# Patient Record
Sex: Female | Born: 1960
Health system: Southern US, Community
[De-identification: ages and names within clinical notes are randomized; demographics above are authoritative.]

## PROBLEM LIST (undated history)

## (undated) DIAGNOSIS — Z800 Family history of malignant neoplasm of digestive organs: Secondary | ICD-10-CM

## (undated) DIAGNOSIS — N6009 Solitary cyst of unspecified breast: Secondary | ICD-10-CM

## (undated) DIAGNOSIS — Z85828 Personal history of other malignant neoplasm of skin: Secondary | ICD-10-CM

## (undated) DIAGNOSIS — Z803 Family history of malignant neoplasm of breast: Secondary | ICD-10-CM

## (undated) DIAGNOSIS — I10 Essential (primary) hypertension: Secondary | ICD-10-CM

## (undated) DIAGNOSIS — E079 Disorder of thyroid, unspecified: Secondary | ICD-10-CM

## (undated) DIAGNOSIS — N390 Urinary tract infection, site not specified: Secondary | ICD-10-CM

## (undated) DIAGNOSIS — C3490 Malignant neoplasm of unspecified part of unspecified bronchus or lung: Secondary | ICD-10-CM

## (undated) DIAGNOSIS — Z8042 Family history of malignant neoplasm of prostate: Secondary | ICD-10-CM

## (undated) DIAGNOSIS — T7840XA Allergy, unspecified, initial encounter: Secondary | ICD-10-CM

## (undated) HISTORY — DX: Personal history of other malignant neoplasm of skin: Z85.828

## (undated) HISTORY — DX: Solitary cyst of unspecified breast: N60.09

## (undated) HISTORY — PX: POLYPECTOMY: SHX149

## (undated) HISTORY — DX: Family history of malignant neoplasm of prostate: Z80.42

## (undated) HISTORY — DX: Family history of malignant neoplasm of breast: Z80.3

## (undated) HISTORY — DX: Allergy, unspecified, initial encounter: T78.40XA

## (undated) HISTORY — DX: Family history of malignant neoplasm of digestive organs: Z80.0

## (undated) HISTORY — PX: LUNG SURGERY: SHX703

## (undated) HISTORY — DX: Essential (primary) hypertension: I10

## (undated) HISTORY — DX: Disorder of thyroid, unspecified: E07.9

## (undated) HISTORY — DX: Malignant neoplasm of unspecified part of unspecified bronchus or lung: C34.90

---

## 1990-06-22 HISTORY — PX: APPENDECTOMY: SHX54

## 1998-11-06 ENCOUNTER — Other Ambulatory Visit: Admission: RE | Admit: 1998-11-06 | Discharge: 1998-11-06 | Payer: Self-pay | Admitting: Gynecology

## 1999-08-21 HISTORY — PX: PELVIC LAPAROSCOPY: SHX162

## 1999-08-21 HISTORY — PX: HYSTEROSCOPY: SHX211

## 1999-09-08 ENCOUNTER — Other Ambulatory Visit: Admission: RE | Admit: 1999-09-08 | Discharge: 1999-09-08 | Payer: Self-pay | Admitting: Gynecology

## 1999-09-11 ENCOUNTER — Encounter (INDEPENDENT_AMBULATORY_CARE_PROVIDER_SITE_OTHER): Payer: Self-pay

## 1999-09-11 ENCOUNTER — Ambulatory Visit (HOSPITAL_COMMUNITY): Admission: RE | Admit: 1999-09-11 | Discharge: 1999-09-11 | Payer: Self-pay | Admitting: Gynecology

## 2000-10-01 ENCOUNTER — Other Ambulatory Visit: Admission: RE | Admit: 2000-10-01 | Discharge: 2000-10-01 | Payer: Self-pay | Admitting: Gynecology

## 2001-10-03 ENCOUNTER — Other Ambulatory Visit: Admission: RE | Admit: 2001-10-03 | Discharge: 2001-10-03 | Payer: Self-pay | Admitting: Gynecology

## 2002-01-12 ENCOUNTER — Encounter: Payer: Self-pay | Admitting: Family Medicine

## 2002-01-12 ENCOUNTER — Encounter: Admission: RE | Admit: 2002-01-12 | Discharge: 2002-01-12 | Payer: Self-pay | Admitting: Family Medicine

## 2002-10-25 ENCOUNTER — Encounter: Admission: RE | Admit: 2002-10-25 | Discharge: 2002-10-25 | Payer: Self-pay | Admitting: Family Medicine

## 2002-10-25 ENCOUNTER — Encounter: Payer: Self-pay | Admitting: Family Medicine

## 2002-11-10 ENCOUNTER — Other Ambulatory Visit: Admission: RE | Admit: 2002-11-10 | Discharge: 2002-11-10 | Payer: Self-pay | Admitting: Gynecology

## 2003-12-07 ENCOUNTER — Other Ambulatory Visit: Admission: RE | Admit: 2003-12-07 | Discharge: 2003-12-07 | Payer: Self-pay | Admitting: Gynecology

## 2004-12-19 ENCOUNTER — Other Ambulatory Visit: Admission: RE | Admit: 2004-12-19 | Discharge: 2004-12-19 | Payer: Self-pay | Admitting: Gynecology

## 2005-03-15 ENCOUNTER — Encounter: Admission: RE | Admit: 2005-03-15 | Discharge: 2005-03-15 | Payer: Self-pay | Admitting: Family Medicine

## 2005-12-21 ENCOUNTER — Other Ambulatory Visit: Admission: RE | Admit: 2005-12-21 | Discharge: 2005-12-21 | Payer: Self-pay | Admitting: Gynecology

## 2007-01-07 ENCOUNTER — Other Ambulatory Visit: Admission: RE | Admit: 2007-01-07 | Discharge: 2007-01-07 | Payer: Self-pay | Admitting: Gynecology

## 2008-02-17 ENCOUNTER — Other Ambulatory Visit: Admission: RE | Admit: 2008-02-17 | Discharge: 2008-02-17 | Payer: Self-pay | Admitting: Gynecology

## 2008-03-01 ENCOUNTER — Ambulatory Visit: Payer: Self-pay | Admitting: Gynecology

## 2008-08-03 ENCOUNTER — Ambulatory Visit: Payer: Self-pay | Admitting: Gastroenterology

## 2008-08-15 ENCOUNTER — Telehealth: Payer: Self-pay | Admitting: Gastroenterology

## 2008-08-17 ENCOUNTER — Ambulatory Visit: Payer: Self-pay | Admitting: Gastroenterology

## 2008-08-17 LAB — HM COLONOSCOPY

## 2009-03-01 ENCOUNTER — Ambulatory Visit: Payer: Self-pay | Admitting: Gynecology

## 2009-03-01 ENCOUNTER — Other Ambulatory Visit: Admission: RE | Admit: 2009-03-01 | Discharge: 2009-03-01 | Payer: Self-pay | Admitting: Gynecology

## 2009-03-01 ENCOUNTER — Encounter: Payer: Self-pay | Admitting: Gynecology

## 2009-03-11 ENCOUNTER — Ambulatory Visit: Payer: Self-pay | Admitting: Gynecology

## 2009-04-05 ENCOUNTER — Ambulatory Visit: Payer: Self-pay | Admitting: Gynecology

## 2009-05-31 ENCOUNTER — Ambulatory Visit: Payer: Self-pay | Admitting: Gynecology

## 2010-04-04 ENCOUNTER — Ambulatory Visit: Payer: Self-pay | Admitting: Gynecology

## 2010-04-04 ENCOUNTER — Other Ambulatory Visit: Admission: RE | Admit: 2010-04-04 | Discharge: 2010-04-04 | Payer: Self-pay | Admitting: Gynecology

## 2010-07-24 NOTE — Miscellaneous (Signed)
Summary: GI PV  Clinical Lists Changes  Medications: Added new medication of MOVIPREP 100 GM  SOLR (PEG-KCL-NACL-NASULF-NA ASC-C) As per prep instructions. - Signed Rx of MOVIPREP 100 GM  SOLR (PEG-KCL-NACL-NASULF-NA ASC-C) As per prep instructions.;  #1 x 0;  Signed;  Entered by: Barton Fanny RN;  Authorized by: Meryl Dare MD [facility];  Method used: Electronically to [facility]  [address] [id number] [address], [place], [place]  [address], Ph: [phone] or [phone], Fax: [phone] Allergies: Added new allergy or adverse reaction of * CONDUCTION GEL FOR ULTRASOUNDS Observations: Added new observation of ALLERGY REV: Done (08/03/2008 13:39) Added new observation of NKA: F (08/03/2008 13:39)    Prescriptions: MOVIPREP 100 GM  SOLR (PEG-KCL-NACL-NASULF-NA ASC-C) As per prep instructions.  #1 x 0   Entered by:   Barton Fanny RN   Authorized by:   Meryl Dare MD [facility]   Signed by:   Barton Fanny RN on 08/03/2008   Method used:   Electronically to        [facility]  [address] [id number] ([facility]ail)       [address]       [place], [place]  [address]       Ph: [phone] or [phone]       Fax: [phone]   RxID:   [id number]

## 2010-07-24 NOTE — Procedures (Signed)
Summary: Colonoscopy   Colonoscopy  Procedure date:  08/17/2008  Findings:      Location:  [facility].    Procedures Next Due Date:    Colonoscopy: 08/2013  COLONOSCOPY PROCEDURE REPORT  PATIENT:  Laura Smith, Laura Smith  MR#:  [id number] BIRTHDATE:   03-07-1961   GENDER:   female  ENDOSCOPIST:   Venita Lick. Russella Dar, MD, [facility] Referred by:   PROCEDURE DATE:  08/17/2008 PROCEDURE:  Colonoscopy, diagnostic ASA CLASS:   Class II INDICATIONS: 1) family history of colon cancer, mother at age 2.  MEDICATIONS:    Fentanyl 100 mcg IV, Versed 10 mg IV  DESCRIPTION OF PROCEDURE:   After the risks benefits and alternatives of the procedure were thoroughly explained, informed consent was obtained.  Digital rectal exam was performed and revealed no abnormalities.   The LB PCF-H180AL [id number] endoscope was introduced through the anus and advanced to the cecum, which was identified by both the appendix and ileocecal valve, without limitations.  The quality of the prep was excellent, using MoviPrep.  The instrument was then slowly withdrawn as the colon was fully examined. <<PROCEDUREIMAGES>>          <<OLD IMAGES>>  FINDINGS:  A normal appearing cecum, ileocecal valve, and appendiceal orifice were identified. The ascending, hepatic flexure, transverse, splenic flexure, descending, sigmoid colon, and rectum appeared unremarkable. Retroflexed views in the rectum revealed internal hemorrhoids, small. The time to cecum =  7.75  minutes. The scope was then withdrawn (time =  9  min) from the patient and the procedure completed.  COMPLICATIONS:   None  ENDOSCOPIC IMPRESSION:  1) Normal colon  2) Internal hemorrhoids    REPEAT EXAM:   In 5 year(s) for Colonoscopy.    Venita Lick. Russella Dar, MD, [facility]    CC: Blair Heys MD

## 2010-11-07 NOTE — Op Note (Signed)
[facility]  Patient:    Laura Smith, Laura Smith                        MRN: [id number] Proc. Date: 09/11/99 Adm. Date:  [id number] Attending:  Merrily Pew                           Operative Report  PREOPERATIVE DIAGNOSIS:       Increasing dysmenorrhea.  Thickened endometrial stripe.  POSTOPERATIVE DIAGNOSIS:      Endometriosis.  Abdominopelvic adhesions.  Absent  proximal right fallopian tube.  OPERATION:                    Hysteroscopy, D&C.  Laparoscopic lysis of adhesions. Biopsy fulguration of endometriosis.  SURGEON:                      Timothy P. Fontaine, M.D.  ASSISTANT:  ANESTHESIA:                   General endotracheal anesthesia.  ESTIMATED BLOOD LOSS:         Minimal.  COMPLICATIONS:                None.  SPECIMEN:                     Endometrial curettings.  Peritoneal biopsy of endometriosis.  SORBITOL DISCREPANCY:         Less than 150 cc.  FINDINGS:                     Hysteroscopy; endometrial cavity noted to be smaller with single tubal ostia identified.  No defect such as polyps, myomas. Laparoscopy; anterior cul-de-sac with minimal vesicouterine scarring consistent  with cesarean section history.  Posterior cul-de-sac with classic powderburn endometriotic lesion medial to right uterosacral ligament midposition, biopsied and subsequently fulgurated.  Uterus overall normal size, midline, and mobile. Left fallopian tube normal length, caliber, and fimbriated end.  Left ovary grossly normal, free, and mobile.  Right fallopian tube with absent proximal section. o evidence of any protuberance from the uterus.  Short distal segment approximately 2 cm noted above pelvic brim with normal appearing fimbria.  Right ovary grossly normal at the level of the pelvic brim.  Cecum grossly normal with no evidence f appendix.  Liver smooth with no abnormalities.  Gallbladder not visualized. The veil of omental adhesions  from umbilicus to suprapubic along the site of her prior midline incision sharply lysed to the level of the periumbilical region.  DESCRIPTION OF PROCEDURE:     The patient was taken to the operating room and underwent general endotracheal anesthesia and was placed in the low dorsal lithotomy position.  Received abdominal, perineal, and vaginal preparation with  Betadine scrub and Betadine solution by the nursing personnel and the bladder was emptied with in-and-out Foley catheterization.  The patient was draped in the usual fashion.  EUA was performed.  Subsequently, the cervix was visualized with a speculum and grasped with a single tooth tenaculum.  The cervix was gently dilated to admit the diagnostic hysteroscope and hysteroscopy was performed with findings noted above.  A gentle sharp curettage was then performed without difficulty and rehysteroscopy again revealed good distention, no evidence of perforation.  The  patient was recatheterized and again emptied her bladder.  The single tooth tenaculum removed and replaced  with a Hulka tenaculum and the surgical team regloved and subsequently we proceeded with the laparoscopy.  A vertical infraumbilical incision was made, the Veress needle placed, its position verified with water and 2 liters of carbon dioxide gas was infused without difficulty. The 10 mm laparoscopic trocar was then placed without difficulty, its position verified visually.  A suprapubic right of midline 5 mm port was then placed under direct  visualization after transillumination for the vessels without difficulty and examination of the pelvic organs and upper abdominal examination was carried out with findings noted above.  The posterior cul-de-sac endometriotic implant was hen biopsied and subsequently, the base was fulgurated using bipolar cautery in a brushing technique.  The veil of omental adhesions was then addressed and through progressive bipolar  cauterization and sharp incision, this veil was released to the level of the periumbilical region.  The operative sites were reinspected and adequate hemostasis was visualized.  Subsequently, the 5 mm port was removed, the gas allowed to escape, and the sites were reinspected under low pressure situation showing adequate hemostasis.  The 10 mm port was then backed out under direct visualization showing adequate hemostasis and no evidence of hernia formation. The infraumbilical incision was then closed using 0 Vicryl in an interrupted subcutaneous fascial stitch and both skin incisions closed using 4-0 Vicryl in interrupted subcuticular stitch.  The incisions were infiltrated using 0.25% Marcaine.  Sterile dressing was applied.  Hulka tenaculum removed.  The patient  awakened without difficulty and taken to the recovery room in good condition having tolerated the procedure well.  Sorbitol discrepancy less than 150 cc. DD:  09/11/99 TD:  09/11/99 Job: [id number]

## 2010-12-17 ENCOUNTER — Ambulatory Visit (INDEPENDENT_AMBULATORY_CARE_PROVIDER_SITE_OTHER): Payer: 59 | Admitting: Gynecology

## 2010-12-17 DIAGNOSIS — B3731 Acute candidiasis of vulva and vagina: Secondary | ICD-10-CM

## 2010-12-17 DIAGNOSIS — R82998 Other abnormal findings in urine: Secondary | ICD-10-CM

## 2010-12-17 DIAGNOSIS — N898 Other specified noninflammatory disorders of vagina: Secondary | ICD-10-CM

## 2010-12-17 DIAGNOSIS — B373 Candidiasis of vulva and vagina: Secondary | ICD-10-CM

## 2010-12-17 DIAGNOSIS — N390 Urinary tract infection, site not specified: Secondary | ICD-10-CM

## 2010-12-17 DIAGNOSIS — R635 Abnormal weight gain: Secondary | ICD-10-CM

## 2011-03-06 ENCOUNTER — Other Ambulatory Visit: Payer: Self-pay | Admitting: Dermatology

## 2011-04-10 ENCOUNTER — Ambulatory Visit (INDEPENDENT_AMBULATORY_CARE_PROVIDER_SITE_OTHER): Payer: 59 | Admitting: Gynecology

## 2011-04-10 ENCOUNTER — Encounter: Payer: Self-pay | Admitting: Gynecology

## 2011-04-10 VITALS — BP 124/80 | Ht 67.0 in | Wt 222.0 lb

## 2011-04-10 DIAGNOSIS — Z01419 Encounter for gynecological examination (general) (routine) without abnormal findings: Secondary | ICD-10-CM

## 2011-04-10 DIAGNOSIS — N951 Menopausal and female climacteric states: Secondary | ICD-10-CM

## 2011-04-10 LAB — HM PAP SMEAR

## 2011-04-10 NOTE — Progress Notes (Signed)
Laura Smith [birth year] [id number]        50 y.o.  for annual exam.  Overall doing well. She does note some hot flashes and sweats. Last menstrual period was January 2012. We removed her IUD in December 2010 and she had some bleeding after that, remained without a menses and then had a spontaneous normal menses in January. No bleeding since then.  Past medical history,surgical history, medications, allergies, family history and social history were all reviewed and documented in the EPIC chart. ROS:  Was performed and pertinent positives and negatives are included in the history.  Exam: chaperone present Filed Vitals:   04/10/11 1400  BP: 124/80   General appearance  Normal Skin grossly normal Head/Neck normal with no cervical or supraclavicular adenopathy thyroid normal Lungs  clear Cardiac RR, without RMG Abdominal  soft, nontender, without masses, organomegaly or hernia Breasts  examined lying and sitting without masses, retractions, discharge or axillary adenopathy. Pelvic  Ext/BUS/vagina  normal   Cervix  normal    Uterus  axial, normal size, shape and contour, midline and mobile nontender   Adnexa  Without masses or tenderness    Anus and perineum  normal   Rectovaginal  normal sphincter tone without palpated masses or tenderness.  External hemorrhoid   Assessment/Plan:  50 y.o. female for annual exam.    1. External hemorrhoid. Patient treating symptomatically which is acceptable to her. She does note some frequent stooling throughout the day and I recommend she try bran-type OTC such as Metamucil or FiberCon to see if this doesn't help.  She does get colonoscopies every 5 years by Dr. Russella Dar due to a family history of colon cancer she is up-to-date with that. 2. Menopausal status. Having some night sweats and hot flashes. I recommended OTC soy trial. I discuss possible HRT but she wants to try the soy first. She had her last period in January after having her IUD removed  approximately a year prior to that. I've asked her if she does any further bleeding she is to report to me for further evaluation. As long as she remains without any bleeding then we'll follow expectantly. 3. Health maintenance. Self breast exams on a monthly basis discussed encouraged. She is due for her mammogram now knows to schedule it. She has no history of abnormal Pap smears and has documented annual normal Pap smears in her chart. Last Pap smear 2011. I discussed current screening guidelines with less frequent screening she's comfortable with this and we'll plan an every 3 year Pap smear and no Pap was done today.  No blood work was done as is all done through her primary office. Assuming she continues well from a gynecologic standpoint she will see me in a year.    Dara Lords MD, 2:52 PM 04/10/2011

## 2011-09-29 ENCOUNTER — Encounter: Payer: Self-pay | Admitting: Gynecology

## 2011-12-18 ENCOUNTER — Other Ambulatory Visit: Payer: Self-pay | Admitting: Dermatology

## 2012-01-15 ENCOUNTER — Other Ambulatory Visit: Payer: Self-pay | Admitting: Dermatology

## 2012-04-22 ENCOUNTER — Ambulatory Visit (INDEPENDENT_AMBULATORY_CARE_PROVIDER_SITE_OTHER): Payer: 59 | Admitting: Gynecology

## 2012-04-22 ENCOUNTER — Encounter: Payer: Self-pay | Admitting: Gynecology

## 2012-04-22 ENCOUNTER — Telehealth: Payer: Self-pay | Admitting: *Deleted

## 2012-04-22 VITALS — BP 120/74 | Ht 67.0 in | Wt 228.0 lb

## 2012-04-22 DIAGNOSIS — I10 Essential (primary) hypertension: Secondary | ICD-10-CM | POA: Insufficient documentation

## 2012-04-22 DIAGNOSIS — Z01419 Encounter for gynecological examination (general) (routine) without abnormal findings: Secondary | ICD-10-CM

## 2012-04-22 DIAGNOSIS — N809 Endometriosis, unspecified: Secondary | ICD-10-CM | POA: Insufficient documentation

## 2012-04-22 DIAGNOSIS — R609 Edema, unspecified: Secondary | ICD-10-CM

## 2012-04-22 DIAGNOSIS — G43909 Migraine, unspecified, not intractable, without status migrainosus: Secondary | ICD-10-CM | POA: Insufficient documentation

## 2012-04-22 DIAGNOSIS — R600 Localized edema: Secondary | ICD-10-CM

## 2012-04-22 DIAGNOSIS — R197 Diarrhea, unspecified: Secondary | ICD-10-CM

## 2012-04-22 DIAGNOSIS — K649 Unspecified hemorrhoids: Secondary | ICD-10-CM | POA: Insufficient documentation

## 2012-04-22 DIAGNOSIS — N3010 Interstitial cystitis (chronic) without hematuria: Secondary | ICD-10-CM | POA: Insufficient documentation

## 2012-04-22 NOTE — Patient Instructions (Signed)
Follow-up for annual exam

## 2012-04-22 NOTE — Telephone Encounter (Signed)
Pt said you told her to make appointment to see Dr.Panosh, she has appointment on 07/06/12. Pt said that was the earliest she could get in, she was placed on cancellation list. Pt asked if you thinks she should be seen sooner? Or okay to wait until 07/06/12? Please advise

## 2012-04-22 NOTE — Progress Notes (Signed)
Laura Smith 1960-07-28 [id number]        51 y.o.  G1P1001 for annual exam.  Several issues that are below  Past medical history,surgical history, medications, allergies, family history and social history were all reviewed and documented in the EPIC chart. ROS:  Was performed and pertinent positives and negatives are included in the history.  Exam: Kim assistant Filed Vitals:   04/22/12 1201  BP: 120/74  Height: 5\' 7"  (1.702 m)  Weight: 228 lb (103.42 kg)   General appearance  Normal Skin grossly normal Head/Neck normal with no cervical or supraclavicular adenopathy thyroid normal Lungs  clear Cardiac RR, without RMG Abdominal  soft, nontender, without masses, organomegaly or hernia Breasts  examined lying and sitting without masses, retractions, discharge or axillary adenopathy. Pelvic  Ext/BUS/vagina  normal   Cervix  normal   Uterus  axial, normal size, shape and contour, midline and mobile nontender   Adnexa  Without masses or tenderness    Anus and perineum  normal   Rectovaginal  normal sphincter tone without palpated masses or tenderness. Large external hemorrhoid noted  Lower extremities with bilateral pitting edema to mid calf  Assessment/Plan:  51 y.o. G1P1001 female for annual exam.   1. Postmenopausal. Patient without bleeding in over a year, prior FSH 84 in 2010. Doing well from a symptom standpoint without significant hot flashes or night sweats. We'll continue to monitor. Patient is to report any bleeding. 2. Pap smear. No Pap smear done today. Last Pap smear 2011. No history of abnormal Pap smears. Plan every 3-5 year screening. 3. Mammography.  Mammogram 09/2011. Continue with annual mammography. SBE monthly reviewed. 4. DEXA. Never had we'll plan closer to 60. Increase calcium vitamin D reviewed. 5. Diarrhea. Patient's having chronic diarrhea for the past 6 months or so. Sees Dr. Russella Dar and had colonoscopy 2010. Recommend she follow up with him in reference to  her chronic diarrhea she agrees to arrange this. 6. Pedal edema. Patient notes worsening intermittent bilateral pedal edema. Exam today does show pitting edema on both ankles. Is being followed for hypertension by Dr. Manus Gunning. Recommended she follow up with him or another physician for evaluation rule out cardiac/hepatic/renal abnormality. Patient agrees to arrange. 7. Hemorrhoid. Patient does have impressive external hemorrhoids. Offered referral to general surgery but she declined. 8. Health maintenance. No blood work done as it is done through her primary physician's office. Follow up as noted above. Follow up in GYN office one year sooner as needed.   Dara Lords MD, 12:57 PM 04/22/2012

## 2012-04-23 LAB — URINALYSIS W MICROSCOPIC + REFLEX CULTURE
Bacteria, UA: NONE SEEN
Bilirubin Urine: NEGATIVE
Casts: NONE SEEN
Crystals: NONE SEEN
Glucose, UA: NEGATIVE mg/dL
Hgb urine dipstick: NEGATIVE
Ketones, ur: NEGATIVE mg/dL
Leukocytes, UA: NEGATIVE
Nitrite: NEGATIVE
Protein, ur: NEGATIVE mg/dL
Specific Gravity, Urine: 1.019 (ref 1.005–1.030)
Squamous Epithelial / LPF: NONE SEEN
Urobilinogen, UA: 0.2 mg/dL (ref 0.0–1.0)
pH: 5.5 (ref 5.0–8.0)

## 2012-04-25 NOTE — Telephone Encounter (Signed)
Pt informed with the below note. 

## 2012-04-25 NOTE — Telephone Encounter (Signed)
I would like her seen sooner but I am not sure that they will have an opening and it really is not an emergency. I would have her stay on the cancellation list and hopefully he'll be able to see her sooner.

## 2012-07-06 ENCOUNTER — Encounter: Payer: Self-pay | Admitting: Internal Medicine

## 2012-07-06 ENCOUNTER — Ambulatory Visit (INDEPENDENT_AMBULATORY_CARE_PROVIDER_SITE_OTHER): Payer: 59 | Admitting: Internal Medicine

## 2012-07-06 VITALS — BP 128/88 | HR 83 | Temp 99.1°F | Ht 67.25 in | Wt 223.0 lb

## 2012-07-06 DIAGNOSIS — K529 Noninfective gastroenteritis and colitis, unspecified: Secondary | ICD-10-CM

## 2012-07-06 DIAGNOSIS — I10 Essential (primary) hypertension: Secondary | ICD-10-CM

## 2012-07-06 DIAGNOSIS — R600 Localized edema: Secondary | ICD-10-CM

## 2012-07-06 DIAGNOSIS — J309 Allergic rhinitis, unspecified: Secondary | ICD-10-CM

## 2012-07-06 DIAGNOSIS — R197 Diarrhea, unspecified: Secondary | ICD-10-CM

## 2012-07-06 DIAGNOSIS — Z6834 Body mass index (BMI) 34.0-34.9, adult: Secondary | ICD-10-CM

## 2012-07-06 DIAGNOSIS — G43909 Migraine, unspecified, not intractable, without status migrainosus: Secondary | ICD-10-CM

## 2012-07-06 DIAGNOSIS — R609 Edema, unspecified: Secondary | ICD-10-CM

## 2012-07-06 DIAGNOSIS — J069 Acute upper respiratory infection, unspecified: Secondary | ICD-10-CM

## 2012-07-06 LAB — HEPATIC FUNCTION PANEL
ALT: 23 U/L (ref 0–35)
AST: 19 U/L (ref 0–37)
Albumin: 3.9 g/dL (ref 3.5–5.2)
Alkaline Phosphatase: 65 U/L (ref 39–117)
Bilirubin, Direct: 0 mg/dL (ref 0.0–0.3)
Total Bilirubin: 0.4 mg/dL (ref 0.3–1.2)
Total Protein: 6.9 g/dL (ref 6.0–8.3)

## 2012-07-06 LAB — T4, FREE: Free T4: 0.73 ng/dL (ref 0.60–1.60)

## 2012-07-06 LAB — TSH: TSH: 1.82 u[IU]/mL (ref 0.35–5.50)

## 2012-07-06 LAB — MAGNESIUM: Magnesium: 2.1 mg/dL (ref 1.5–2.5)

## 2012-07-06 MED ORDER — HYDROCOD POLST-CHLORPHEN POLST 10-8 MG/5ML PO LQCR: 5.0000 mL | Freq: Two times a day (BID) | ORAL | Status: DC | PRN

## 2012-07-06 MED ORDER — ALBUTEROL SULFATE HFA 108 (90 BASE) MCG/ACT IN AERS: 2.0000 | INHALATION_SPRAY | Freq: Four times a day (QID) | RESPIRATORY_TRACT | Status: DC | PRN

## 2012-07-06 NOTE — Patient Instructions (Addendum)
Most causes of swelling are related to gravity medications salt and fluids  And temperature. Good news doesn't seem to be a kidney or heart problem.  Avoid antiinflammatories such as advil aleve on a regular basis. Consider nasal cortisone in allergy season Limit sodium salt  Etc . Increase exercise can help  Get rid of fluid also.    Will check thyroid to make sure not related .   Your respiratory infection seems viral and could last for a few weeks but contact us if fever or wheezing . Can try cough med and inhaler as helpful . Stools could be from many t hings  consdier lactose intolerance extta magnesium . Certain sugars in foods and juices. Will follow  Weight gain at menopause if common if not taking measures to change eat less and exercise more but we need to make sure other things not contributing.  Sleep deprivation is associated with weight gain also.  ROV in a couple months or as needed in the meantime     Cough, Adult  A cough is a reflex that helps clear your throat and airways. It can help heal the body or may be a reaction to an irritated airway. A cough may only last 2 or 3 weeks (acute) or may last more than 8 weeks (chronic).  CAUSES Acute cough:  Viral or bacterial infections. Chronic cough:  Infections.  Allergies.  Asthma.  Post-nasal drip.  Smoking.  Heartburn or acid reflux.  Some medicines.  Chronic lung problems (COPD).  Cancer. SYMPTOMS   Cough.  Fever.  Chest pain.  Increased breathing rate.  High-pitched whistling sound when breathing (wheezing).  Colored mucus that you cough up (sputum). TREATMENT   A bacterial cough may be treated with antibiotic medicine.  A viral cough must run its course and will not respond to antibiotics.  Your caregiver may recommend other treatments if you have a chronic cough. HOME CARE INSTRUCTIONS   Only take over-the-counter or prescription medicines for pain, discomfort, or fever as directed by  your caregiver. Use cough suppressants only as directed by your caregiver.  Use a cold steam vaporizer or humidifier in your bedroom or home to help loosen secretions.  Sleep in a semi-upright position if your cough is worse at night.  Rest as needed.  Stop smoking if you smoke. SEEK IMMEDIATE MEDICAL CARE IF:   You have pus in your sputum.  Your cough starts to worsen.  You cannot control your cough with suppressants and are losing sleep.  You begin coughing up blood.  You have difficulty breathing.  You develop pain which is getting worse or is uncontrolled with medicine.  You have a fever. MAKE SURE YOU:   Understand these instructions.  Will watch your condition.  Will get help right away if you are not doing well or get worse. Document Released: 12/05/2010 Document Revised: 08/31/2011 Document Reviewed: 12/05/2010 [facility] Patient Information 2013 [place], [place].    Using Your Inhaler Proper inhaler technique is very important. Good technique assures that the medicine reaches the lungs. Poor technique results in depositing the medicine on the tongue and back of the throat rather than in the airways. STEPS TO FOLLOW IF USING INHALER WITHOUT EXTENSION TUBE: 1. Remove cap from inhaler. 2. Shake inhaler for 5 seconds before each inhalation (breathing in). 3. Position the inhaler so that the top of the canister faces up. 4. Put your index finger on the top of the medication canister. Your thumb supports the bottom of  the inhaler. 5. Open your mouth. 6. Hold the inhaler 1 to 2 inches away from your open mouth. This allows the medicine to slow down before the medicine enters the mouth. 7. Exhale (breathe out) normally and as completely as possible. 8. Press the canister down with the index finger to release the medication. 9. At the same time as the canister is pressed, inhale deeply and slowly until the lungs are completely filled. This should take 4 to 6 seconds. Keep  your tongue down and out of the way. 10. Hold the medication in your lungs for up to 10 seconds (10 seconds is best). This helps the medicine get into the small airways of your lungs to work better. 11. Breathe out slowly, through pursed lips. Whistling is an example of pursed lips. 12. Wait at least 1 minute between puffs. Continue with the above steps until you have taken the number of puffs your caregiver has ordered. 13. Replace cap on inhaler. STEPS TO FOLLOW USING AN INHALER WITH AN EXTENSION (SPACER): 1.  Remove cap from inhaler. 2. Shake inhaler for 5 seconds before each inhalation (breathing in). 3. Your caregiver has asked you to use a spacer with your inhaler. A spacer is a plastic tube with a mouthpiece on one end and an opening that connects to the inhaler on the other end. A spacer helps you take the medicine better. 4. Place the open end of the spacer onto the mouthpiece of the inhaler. 5. Position the inhaler so that the top of the canister faces up and the spacer mouthpiece faces you. 6. Put your index finger on the top of the medication canister. Your thumb supports the bottom of the inhaler and the spacer. 7. Exhale (breathe out) normally and as completely as possible. 8. Immediately after exhaling, place the spacer between your teeth and into your mouth. Close your mouth tightly around the spacer. 9. Press the canister down with the index finger to release the medication. 10. At the same time as the canister is pressed, inhale deeply and slowly until the lungs are completely filled. This should take 4 to 6 seconds. Keep your tongue down and out of the way. 11. Hold the medication in your lungs for up to 10 seconds (10 seconds is best). This helps the medicine get into the small airways of your lungs to work better. Exhale. 12. Repeat inhaling deeply through the spacer mouthpiece. Again hold that breath for up to 10 seconds (10 seconds is best). Exhale slowly. If it is difficult  to take this second deep breath through the spacer, breathe normally several times through the spacer. Remove the spacer from your mouth. 13. Wait at least 1 minute between puffs. Continue with the above steps until you have taken the number of puffs your caregiver has ordered. 14. Remove spacer from the inhaler and place cap on inhaler. If you are using different kinds of inhalers, use your quick relief medicine to open the airways 10 - 15 minutes before using a steroid. If you are unsure which inhalers to use and the order of using them, ask your caregiver, nurse, or respiratory therapist. If you are using a steroid inhaler, rinse your mouth with water after your last puff and then spit out the water. Do not swallow the water. Avoid the following:  Inhaling before or after starting the spray of medicine. It takes practice to coordinate your breathing with triggering the spray.  Inhaling through the nose (rather than the mouth) when  triggering the spray. HOW TO DETERMINE IF YOUR INHALER IS FULL OR NEARLY EMPTY:  Determine when an inhaler is empty. You cannot know when an inhaler is empty by shaking it. A few inhalers are now being made with dose counters. Ask your caregiver for a prescription that has a dose counter if you feel you need that extra help.  If your inhaler does not have a counter, check the number of doses in the inhaler before you use it. The canister or box will list the number of doses in the canister. Divide the total number of doses in the canister by the number you will use each day to find how many days the canister will last. (For example, if your canister has 200 doses and you take 2 puffs, 4 times each day, which is 8 puffs a day. Dividing 200 by 8 equals 25. The canister should last 25 days.) Using a calendar, count forward that many days to see when your inhaler will run out. Write the refill date on a calendar or your canister.  Remember, if you need to take extra doses,  the inhaler will empty sooner than you figured. Be sure you have a refill before your canister runs out. Refill your inhaler 7 to 10 days before it runs out. HOME CARE INSTRUCTIONS   Do not use the inhaler more than your caregiver tells you. If you are still wheezing and are feeling tightness in your chest, call your caregiver.  Keep an adequate supply of medication. This includes making sure the medicine is not expired, and you have a spare inhaler.  Follow your caregiver or inhaler insert directions for cleaning the inhaler and spacer. SEEK MEDICAL CARE IF:   Symptoms are only partially relieved with your inhaler.  You are having trouble using your inhaler.  You experience some increase in phlegm.  You develop a fever of 100.5 F (38.1 C). SEEK IMMEDIATE MEDICAL CARE IF:   You feel little or no relief with your inhalers. You are still wheezing and are feeling shortness of breath and/or tightness in your chest.  You have side effects such as dizziness, headaches or fast heart rate.  You have chills, fever, night sweats or an oral temperature above 102 F (38.9 C).  Phlegm production increases a lot, or there is blood in the phlegm. MAKE SURE YOU:   Understand these instructions.  Will watch your condition.  Will get help right away if you are not doing well or get worse. Document Released: 06/05/2000 Document Revised: 08/31/2011 Document Reviewed: 03/26/2009 [facility] Patient Information 2013 [place], [place]. Edema Edema is an abnormal build-up of fluids in tissues. Because this is partly dependent on gravity (water flows to the lowest place), it is more common in the legs and thighs (lower extremities). It is also common in the looser tissues, like around the eyes. Painless swelling of the feet and ankles is common and increases as a person ages. It may affect both legs and may include the calves or even thighs. When squeezed, the fluid may move out of the affected area and may  leave a dent for a few moments. CAUSES   Prolonged standing or sitting in one place for extended periods of time. Movement helps pump tissue fluid into the veins, and absence of movement prevents this, resulting in edema.  Varicose veins. The valves in the veins do not work as well as they should. This causes fluid to leak into the tissues.  Fluid and salt overload.  Injury, burn, or surgery to the leg, ankle, or foot, may damage veins and allow fluid to leak out.  Sunburn damages vessels. Leaky vessels allow fluid to go out into the sunburned tissues.  Allergies (from insect bites or stings, medications or chemicals) cause swelling by allowing vessels to become leaky.  Protein in the blood helps keep fluid in your vessels. Low protein, as in malnutrition, allows fluid to leak out.  Hormonal changes, including pregnancy and menstruation, cause fluid retention. This fluid may leak out of vessels and cause edema.  Medications that cause fluid retention. Examples are sex hormones, blood pressure medications, steroid treatment, or anti-depressants.  Some illnesses cause edema, especially heart failure, kidney disease, or liver disease.  Surgery that cuts veins or lymph nodes, such as surgery done for the heart or for breast cancer, may result in edema. DIAGNOSIS  Your caregiver is usually easily able to determine what is causing your swelling (edema) by simply asking what is wrong (getting a history) and examining you (doing a physical). Sometimes x-rays, EKG (electrocardiogram or heart tracing), and blood work may be done to evaluate for underlying medical illness. TREATMENT  General treatment includes:  Leg elevation (or elevation of the affected body part).  Restriction of fluid intake.  Prevention of fluid overload.  Compression of the affected body part. Compression with elastic bandages or support stockings squeezes the tissues, preventing fluid from entering and forcing it back  into the blood vessels.  Diuretics (also called water pills or fluid pills) pull fluid out of your body in the form of increased urination. These are effective in reducing the swelling, but can have side effects and must be used only under your caregiver's supervision. Diuretics are appropriate only for some types of edema. The specific treatment can be directed at any underlying causes discovered. Heart, liver, or kidney disease should be treated appropriately. HOME CARE INSTRUCTIONS   Elevate the legs (or affected body part) above the level of the heart, while lying down.  Avoid sitting or standing still for prolonged periods of time.  Avoid putting anything directly under the knees when lying down, and do not wear constricting clothing or garters on the upper legs.  Exercising the legs causes the fluid to work back into the veins and lymphatic channels. This may help the swelling go down.  The pressure applied by elastic bandages or support stockings can help reduce ankle swelling.  A low-salt diet may help reduce fluid retention and decrease the ankle swelling.  Take any medications exactly as prescribed. SEEK MEDICAL CARE IF:  Your edema is not responding to recommended treatments. SEEK IMMEDIATE MEDICAL CARE IF:   You develop shortness of breath or chest pain.  You cannot breathe when you lay down; or if, while lying down, you have to get up and go to the window to get your breath.  You are having increasing swelling without relief from treatment.  You develop a fever over 102 F (38.9 C).  You develop pain or redness in the areas that are swollen.  Tell your caregiver right away if you have gained 3 lb/1.4 kg in 1 day or 5 lb/2.3 kg in a week. MAKE SURE YOU:   Understand these instructions.  Will watch your condition.  Will get help right away if you are not doing well or get worse. Document Released: 06/08/2005 Document Revised: 12/08/2011 Document Reviewed:  01/25/2008 [facility] Patient Information 2013 [place], [place].

## 2012-07-06 NOTE — Progress Notes (Signed)
Chief Complaint  Patient presents with  . Establish Care    The patient sometimes has pink, swollen, painful legs and feet.   HPI: Patient comes in as new patient visit . Previous care was  [facility]  Has a number of issues . feet swelling in the summer  Prev md said from heat but pt was concerned about this . NO cp sob hx of sig cv pulm disease.  Has been on allergy meds no sig on going. pulm disease.   Has had a recent coughing illness  For  The week  No fever no wheezing  No focal pain . Using otc ? Other meds to help  Hx of migraines sees cr lewit and uses rescue meds  Hydromorphone prn  And  imitrex .  Has frequent stools? Diarrhea  Brings in a diary of stool frequencies go from 1- 6 per day  No blood sig abd pain or  Weight loss fever chills .  Has had a colonoscopy  About 2012  No hx recent antibiotics chronic bowel disease travel or fever chills.  ROS: See pertinent positives and negatives per HPI.no bleeding  Rest of 12 system review [place]   Past Medical History  Diagnosis Date  . Death of family member     HUSBAND DIED AT AGE 5 MI  . Migraine   . IC (interstitial cystitis)   . Hemorrhoids   . Hypertension   . Endometriosis     surgery laser 2001  . Hx of basal cell carcinoma     Family History  Problem Relation Age of Onset  . Cancer Mother 30    COLON  . Breast cancer Mother     Age 45  . Heart disease Father   . Hypertension Paternal Grandmother   . Heart disease Paternal Grandmother   . Stroke Paternal Grandmother   . Hypertension Paternal Grandfather   . Heart disease Paternal Grandfather   . Cancer Paternal Grandfather     Prostate  . Breast cancer Cousin     Age 100's  . Stroke Maternal Grandfather     History   Social History  . Marital Status: Married    Spouse Name: N/A    Number of Children: N/A  . Years of Education: N/A   Social History Main Topics  . Smoking status: Never Smoker   . Smokeless tobacco: None  . Alcohol Use: No  . Drug Use:  None  . Sexually Active: No   Other Topics Concern  . None   Social History Narrative   h h of 3 g1 [employer]     Outpatient Encounter Prescriptions as of 07/06/2012  Medication Sig Dispense Refill  . Ascorbic Acid (VITAMIN C) 500 MG CAPS Take by mouth.      Marland Kitchen b complex vitamins tablet Take 1 tablet by mouth daily.        Marland Kitchen CALCIUM-MAGNESIUM PO Take by mouth.      . Cetirizine HCl (ZYRTEC PO) Take by mouth.        . hydrochlorothiazide 25 MG tablet Take 25 mg by mouth daily.        . Hydrocodone-Acetaminophen (VICODIN PO) Take by mouth. Prn headache.       Marland Kitchen HYDROMORPHONE HCL PO Take by mouth.        . montelukast (SINGULAIR) 10 MG tablet Take 10 mg by mouth at bedtime.        . Multiple Vitamins-Iron (MULTIVITAMIN/IRON)  TABS Take by mouth.        . SUMAtriptan (IMITREX) 6 MG/0.5ML SOLN injection Inject 6 mg into the skin every 2 (two) hours as needed. F      . verapamil (COVERA HS) 180 MG (CO) 24 hr tablet Take 180 mg by mouth at bedtime.        Marland Kitchen ZOLMitriptan (ZOMIG NA) Place into the nose.      . albuterol (PROAIR HFA) 108 (90 BASE) MCG/ACT inhaler Inhale 2 puffs into the lungs every 6 (six) hours as needed for wheezing.  1 Inhaler  1  . chlorpheniramine-HYDROcodone (TUSSIONEX) 10-8 MG/5ML LQCR Take 5 mLs by mouth every 12 (twelve) hours as needed (for cough ).  60 mL  0  . [DISCONTINUED] Ascorbic Acid (VITAMIN C PO) Take by mouth.        . [DISCONTINUED] flurbiprofen (ANSAID) 100 MG tablet Take 100 mg by mouth 2 (two) times daily.          EXAM:  BP 128/88  Pulse 83  Temp 99.1 F (37.3 C) (Oral)  Ht 5' 7.25" (1.708 m)  Wt 223 lb (101.152 kg)  BMI 34.67 kg/m2  SpO2 98%  Body mass index is 34.67 kg/(m^2).  GENERAL: vitals reviewed and listed above, alert, oriented, appears well hydrated and in no acute distress HEENT: Normocephalic ;atraumatic , Eyes;  PERRL, EOMs  Full, lids and conjunctiva clear,,Ears: no deformities, canals nl, TM  landmarks normal, Nose: no deformity or discharge  Mouth : OP clear without lesion or edema .   NECK: no obvious masses on inspection palpation no jvd adenopathy   LUNGS: clear to auscultation bilaterally, no wheezes, rales or rhonchi, good air movement  CV: HRRR, S1 S2 no g or m no clubbing cyanosis or sig   peripheral edema trc edema  nl cap refill  Abdomen:  Sof,t normal bowel sounds without hepatosplenomegaly, no guarding rebound or masses no CVA tenderness  MS: moves all extremities without noticeable focal  abnormality NEURO: oriented x 3 CN 3-12 appear intact. No focal muscle weakness or atrophy. DTRs symmetrical. Gait WNL.  Grossly non focal. No tremor or abnormal movement. Skin: normal capillary refill ,turgor , color: No acute rashes ,petechiae or bruising PSYCH: pleasant and cooperative, no obvious depression or anxiety  ASSESSMENT AND PLAN:  Discussed the following assessment and plan:  1. Edema of both legs  CALCIUM-MAGNESIUM PO, Ascorbic Acid (VITAMIN C) 500 MG CAPS, TSH, T4, free, Celiac panel 10, Hepatic function panel, Magnesium   minor at present was in past in summer  diff dx given normal exam.  2. BMI 34.0-34.9,adult  CALCIUM-MAGNESIUM PO, Ascorbic Acid (VITAMIN C) 500 MG CAPS, TSH, T4, free, Celiac panel 10, Hepatic function panel, Magnesium  3. Hypertension  CALCIUM-MAGNESIUM PO, Ascorbic Acid (VITAMIN C) 500 MG CAPS, TSH, T4, free, Celiac panel 10, Hepatic function panel, Magnesium   control   4. Allergic rhinitis, cause unspecified  CALCIUM-MAGNESIUM PO, Ascorbic Acid (VITAMIN C) 500 MG CAPS, TSH, T4, free, Celiac panel 10, Hepatic function panel, Magnesium   seasonal ? no help with  singulair.   5. Migraine  CALCIUM-MAGNESIUM PO, Ascorbic Acid (VITAMIN C) 500 MG CAPS, TSH, T4, free, Celiac panel 10, Hepatic function panel, Magnesium   under specialty care   6. Viral upper respiratory tract infection with cough  CALCIUM-MAGNESIUM PO, Ascorbic Acid (VITAMIN C) 500 MG  CAPS, TSH, T4, free, Celiac panel 10, Hepatic function panel, Magnesium   using otc asks for tissionex rx with caution  no alarm features at this time  7. Frequent stools  CALCIUM-MAGNESIUM PO, Ascorbic Acid (VITAMIN C) 500 MG CAPS, TSH, T4, free, Celiac panel 10, Hepatic function panel, Magnesium   uncertain cause    Records reviewed no tsh done recently last pv and lab in the fall 2013  Do not see tdap in the records given but presumed utd . Multiple  Issues discussed  Plan fu and labs  -Patient advised to return or notify health care team  immediately if symptoms worsen or persist or new concerns arise.  Patient Instructions  Most causes of swelling are related to gravity medications salt and fluids  And temperature. Good news doesn't seem to be a kidney or heart problem.  Avoid antiinflammatories such as advil aleve on a regular basis. Consider nasal cortisone in allergy season Limit sodium salt  Etc . Increase exercise can help  Get rid of fluid also.    Will check thyroid to make sure not related .   Your respiratory infection seems viral and could last for a few weeks but contact us if fever or wheezing . Can try cough med and inhaler as helpful . Stools could be from many t hings  consdier lactose intolerance extta magnesium . Certain sugars in foods and juices. Will follow  Weight gain at menopause if common if not taking measures to change eat less and exercise more but we need to make sure other things not contributing.  Sleep deprivation is associated with weight gain also.  ROV in a couple months or as needed in the meantime     Cough, Adult  A cough is a reflex that helps clear your throat and airways. It can help heal the body or may be a reaction to an irritated airway. A cough may only last 2 or 3 weeks (acute) or may last more than 8 weeks (chronic).  CAUSES Acute cough:  Viral or bacterial infections. Chronic cough:  Infections.  Allergies.  Asthma.  Post-nasal  drip.  Smoking.  Heartburn or acid reflux.  Some medicines.  Chronic lung problems (COPD).  Cancer. SYMPTOMS   Cough.  Fever.  Chest pain.  Increased breathing rate.  High-pitched whistling sound when breathing (wheezing).  Colored mucus that you cough up (sputum). TREATMENT   A bacterial cough may be treated with antibiotic medicine.  A viral cough must run its course and will not respond to antibiotics.  Your caregiver may recommend other treatments if you have a chronic cough. HOME CARE INSTRUCTIONS   Only take over-the-counter or prescription medicines for pain, discomfort, or fever as directed by your caregiver. Use cough suppressants only as directed by your caregiver.  Use a cold steam vaporizer or humidifier in your bedroom or home to help loosen secretions.  Sleep in a semi-upright position if your cough is worse at night.  Rest as needed.  Stop smoking if you smoke. SEEK IMMEDIATE MEDICAL CARE IF:   You have pus in your sputum.  Your cough starts to worsen.  You cannot control your cough with suppressants and are losing sleep.  You begin coughing up blood.  You have difficulty breathing.  You develop pain which is getting worse or is uncontrolled with medicine.  You have a fever. MAKE SURE YOU:   Understand these instructions.  Will watch your condition.  Will get help right away if you are not doing well or get worse. Document Released: 12/05/2010 Document Revised: 08/31/2011 Document Reviewed: 12/05/2010 [facility] Patient Information 2013 [place].    Using Your Inhaler Proper inhaler technique is very important. Good technique assures that the medicine reaches the lungs. Poor technique results in depositing the medicine on the tongue and back of the throat rather than in the airways. STEPS TO FOLLOW IF USING INHALER WITHOUT EXTENSION TUBE: 1. Remove cap from inhaler. 2. Shake inhaler for 5 seconds before each inhalation  (breathing in). 3. Position the inhaler so that the top of the canister faces up. 4. Put your index finger on the top of the medication canister. Your thumb supports the bottom of the inhaler. 5. Open your mouth. 6. Hold the inhaler 1 to 2 inches away from your open mouth. This allows the medicine to slow down before the medicine enters the mouth. 7. Exhale (breathe out) normally and as completely as possible. 8. Press the canister down with the index finger to release the medication. 9. At the same time as the canister is pressed, inhale deeply and slowly until the lungs are completely filled. This should take 4 to 6 seconds. Keep your tongue down and out of the way. 10. Hold the medication in your lungs for up to 10 seconds (10 seconds is best). This helps the medicine get into the small airways of your lungs to work better. 11. Breathe out slowly, through pursed lips. Whistling is an example of pursed lips. 12. Wait at least 1 minute between puffs. Continue with the above steps until you have taken the number of puffs your caregiver has ordered. 13. Replace cap on inhaler. STEPS TO FOLLOW USING AN INHALER WITH AN EXTENSION (SPACER): 1.  Remove cap from inhaler. 2. Shake inhaler for 5 seconds before each inhalation (breathing in). 3. Your caregiver has asked you to use a spacer with your inhaler. A spacer is a plastic tube with a mouthpiece on one end and an opening that connects to the inhaler on the other end. A spacer helps you take the medicine better. 4. Place the open end of the spacer onto the mouthpiece of the inhaler. 5. Position the inhaler so that the top of the canister faces up and the spacer mouthpiece faces you. 6. Put your index finger on the top of the medication canister. Your thumb supports the bottom of the inhaler and the spacer. 7. Exhale (breathe out) normally and as completely as possible. 8. Immediately after exhaling, place the spacer between your teeth and into your  mouth. Close your mouth tightly around the spacer. 9. Press the canister down with the index finger to release the medication. 10. At the same time as the canister is pressed, inhale deeply and slowly until the lungs are completely filled. This should take 4 to 6 seconds. Keep your tongue down and out of the way. 11. Hold the medication in your lungs for up to 10 seconds (10 seconds is best). This helps the medicine get into the small airways of your lungs to work better. Exhale. 12. Repeat inhaling deeply through the spacer mouthpiece. Again hold that breath for up to 10 seconds (10 seconds is best). Exhale slowly. If it is difficult to take this second deep breath through the spacer, breathe normally several times through the spacer. Remove the spacer from your mouth. 13. Wait at least 1 minute between puffs. Continue with the above steps until you have taken the number of puffs your caregiver has ordered. 14. Remove spacer from the inhaler and place cap on inhaler. If you are using different kinds of inhalers, use  your quick relief medicine to open the airways 10 - 15 minutes before using a steroid. If you are unsure which inhalers to use and the order of using them, ask your caregiver, nurse, or respiratory therapist. If you are using a steroid inhaler, rinse your mouth with water after your last puff and then spit out the water. Do not swallow the water. Avoid the following:  Inhaling before or after starting the spray of medicine. It takes practice to coordinate your breathing with triggering the spray.  Inhaling through the nose (rather than the mouth) when triggering the spray. HOW TO DETERMINE IF YOUR INHALER IS FULL OR NEARLY EMPTY:  Determine when an inhaler is empty. You cannot know when an inhaler is empty by shaking it. A few inhalers are now being made with dose counters. Ask your caregiver for a prescription that has a dose counter if you feel you need that extra help.  If your  inhaler does not have a counter, check the number of doses in the inhaler before you use it. The canister or box will list the number of doses in the canister. Divide the total number of doses in the canister by the number you will use each day to find how many days the canister will last. (For example, if your canister has 200 doses and you take 2 puffs, 4 times each day, which is 8 puffs a day. Dividing 200 by 8 equals 25. The canister should last 25 days.) Using a calendar, count forward that many days to see when your inhaler will run out. Write the refill date on a calendar or your canister.  Remember, if you need to take extra doses, the inhaler will empty sooner than you figured. Be sure you have a refill before your canister runs out. Refill your inhaler 7 to 10 days before it runs out. HOME CARE INSTRUCTIONS   Do not use the inhaler more than your caregiver tells you. If you are still wheezing and are feeling tightness in your chest, call your caregiver.  Keep an adequate supply of medication. This includes making sure the medicine is not expired, and you have a spare inhaler.  Follow your caregiver or inhaler insert directions for cleaning the inhaler and spacer. SEEK MEDICAL CARE IF:   Symptoms are only partially relieved with your inhaler.  You are having trouble using your inhaler.  You experience some increase in phlegm.  You develop a fever of 100.5 F (38.1 C). SEEK IMMEDIATE MEDICAL CARE IF:   You feel little or no relief with your inhalers. You are still wheezing and are feeling shortness of breath and/or tightness in your chest.  You have side effects such as dizziness, headaches or fast heart rate.  You have chills, fever, night sweats or an oral temperature above 102 F (38.9 C).  Phlegm production increases a lot, or there is blood in the phlegm. MAKE SURE YOU:   Understand these instructions.  Will watch your condition.  Will get help right away if you are  not doing well or get worse. Document Released: 06/05/2000 Document Revised: 08/31/2011 Document Reviewed: 03/26/2009 [facility] Patient Information 2013 [place], [place]. Edema Edema is an abnormal build-up of fluids in tissues. Because this is partly dependent on gravity (water flows to the lowest place), it is more common in the legs and thighs (lower extremities). It is also common in the looser tissues, like around the eyes. Painless swelling of the feet and ankles is common and increases  as a person ages. It may affect both legs and may include the calves or even thighs. When squeezed, the fluid may move out of the affected area and may leave a dent for a few moments. CAUSES   Prolonged standing or sitting in one place for extended periods of time. Movement helps pump tissue fluid into the veins, and absence of movement prevents this, resulting in edema.  Varicose veins. The valves in the veins do not work as well as they should. This causes fluid to leak into the tissues.  Fluid and salt overload.  Injury, burn, or surgery to the leg, ankle, or foot, may damage veins and allow fluid to leak out.  Sunburn damages vessels. Leaky vessels allow fluid to go out into the sunburned tissues.  Allergies (from insect bites or stings, medications or chemicals) cause swelling by allowing vessels to become leaky.  Protein in the blood helps keep fluid in your vessels. Low protein, as in malnutrition, allows fluid to leak out.  Hormonal changes, including pregnancy and menstruation, cause fluid retention. This fluid may leak out of vessels and cause edema.  Medications that cause fluid retention. Examples are sex hormones, blood pressure medications, steroid treatment, or anti-depressants.  Some illnesses cause edema, especially heart failure, kidney disease, or liver disease.  Surgery that cuts veins or lymph nodes, such as surgery done for the heart or for breast cancer, may result in  edema. DIAGNOSIS  Your caregiver is usually easily able to determine what is causing your swelling (edema) by simply asking what is wrong (getting a history) and examining you (doing a physical). Sometimes x-rays, EKG (electrocardiogram or heart tracing), and blood work may be done to evaluate for underlying medical illness. TREATMENT  General treatment includes:  Leg elevation (or elevation of the affected body part).  Restriction of fluid intake.  Prevention of fluid overload.  Compression of the affected body part. Compression with elastic bandages or support stockings squeezes the tissues, preventing fluid from entering and forcing it back into the blood vessels.  Diuretics (also called water pills or fluid pills) pull fluid out of your body in the form of increased urination. These are effective in reducing the swelling, but can have side effects and must be used only under your caregiver's supervision. Diuretics are appropriate only for some types of edema. The specific treatment can be directed at any underlying causes discovered. Heart, liver, or kidney disease should be treated appropriately. HOME CARE INSTRUCTIONS   Elevate the legs (or affected body part) above the level of the heart, while lying down.  Avoid sitting or standing still for prolonged periods of time.  Avoid putting anything directly under the knees when lying down, and do not wear constricting clothing or garters on the upper legs.  Exercising the legs causes the fluid to work back into the veins and lymphatic channels. This may help the swelling go down.  The pressure applied by elastic bandages or support stockings can help reduce ankle swelling.  A low-salt diet may help reduce fluid retention and decrease the ankle swelling.  Take any medications exactly as prescribed. SEEK MEDICAL CARE IF:  Your edema is not responding to recommended treatments. SEEK IMMEDIATE MEDICAL CARE IF:   You develop shortness  of breath or chest pain.  You cannot breathe when you lay down; or if, while lying down, you have to get up and go to the window to get your breath.  You are having increasing swelling without relief from  treatment.  You develop a fever over 102 F (38.9 C).  You develop pain or redness in the areas that are swollen.  Tell your caregiver right away if you have gained 3 lb/1.4 kg in 1 day or 5 lb/2.3 kg in a week. MAKE SURE YOU:   Understand these instructions.  Will watch your condition.  Will get help right away if you are not doing well or get worse. Document Released: 06/08/2005 Document Revised: 12/08/2011 Document Reviewed: 01/25/2008 [facility] Patient Information 2013 [place], [place].      Neta Mends. Panosh M.D.

## 2012-07-07 LAB — CELIAC PANEL 10
Endomysial Screen: NEGATIVE
Gliadin IgA: 2.3 U/mL (ref <20)
Gliadin IgG: 3.8 U/mL (ref <20)
IgA: 138 mg/dL (ref 69–380)
Tissue Transglut Ab: 4.6 U/mL (ref <20)
Tissue Transglutaminase Ab, IgA: 2.5 U/mL (ref <20)

## 2012-07-10 ENCOUNTER — Encounter: Payer: Self-pay | Admitting: Internal Medicine

## 2012-07-10 DIAGNOSIS — K529 Noninfective gastroenteritis and colitis, unspecified: Secondary | ICD-10-CM | POA: Insufficient documentation

## 2012-07-10 DIAGNOSIS — R600 Localized edema: Secondary | ICD-10-CM | POA: Insufficient documentation

## 2012-07-10 DIAGNOSIS — Z6834 Body mass index (BMI) 34.0-34.9, adult: Secondary | ICD-10-CM | POA: Insufficient documentation

## 2012-07-10 DIAGNOSIS — J309 Allergic rhinitis, unspecified: Secondary | ICD-10-CM | POA: Insufficient documentation

## 2012-09-02 ENCOUNTER — Ambulatory Visit: Payer: 59 | Admitting: Internal Medicine

## 2012-09-10 ENCOUNTER — Ambulatory Visit: Payer: 59 | Admitting: Family Medicine

## 2012-09-10 VITALS — BP 136/72 | HR 85 | Temp 98.6°F | Resp 16 | Ht 68.0 in | Wt 225.0 lb

## 2012-09-10 DIAGNOSIS — R109 Unspecified abdominal pain: Secondary | ICD-10-CM

## 2012-09-10 DIAGNOSIS — M545 Low back pain, unspecified: Secondary | ICD-10-CM

## 2012-09-10 DIAGNOSIS — N3010 Interstitial cystitis (chronic) without hematuria: Secondary | ICD-10-CM

## 2012-09-10 DIAGNOSIS — N390 Urinary tract infection, site not specified: Secondary | ICD-10-CM

## 2012-09-10 DIAGNOSIS — M549 Dorsalgia, unspecified: Secondary | ICD-10-CM

## 2012-09-10 DIAGNOSIS — N3000 Acute cystitis without hematuria: Secondary | ICD-10-CM

## 2012-09-10 LAB — POCT URINALYSIS DIPSTICK
Bilirubin, UA: NEGATIVE
Glucose, UA: NEGATIVE
Ketones, UA: NEGATIVE
Nitrite, UA: POSITIVE
Protein, UA: 100
Spec Grav, UA: 1.015
Urobilinogen, UA: 0.2
pH, UA: 6

## 2012-09-10 LAB — POCT CBC
Granulocyte percent: 71.3 %G (ref 37–80)
HCT, POC: 42.2 % (ref 37.7–47.9)
Hemoglobin: 13.4 g/dL (ref 12.2–16.2)
Lymph, poc: 2.5 (ref 0.6–3.4)
MCH, POC: 28.2 pg (ref 27–31.2)
MCHC: 31.8 g/dL (ref 31.8–35.4)
MCV: 88.9 fL (ref 80–97)
MID (cbc): 0.8 (ref 0–0.9)
MPV: 8.6 fL (ref 0–99.8)
POC Granulocyte: 8.4 — AB (ref 2–6.9)
POC LYMPH PERCENT: 21.6 %L (ref 10–50)
POC MID %: 7.1 %M (ref 0–12)
Platelet Count, POC: 333 10*3/uL (ref 142–424)
RBC: 4.75 M/uL (ref 4.04–5.48)
RDW, POC: 14 %
WBC: 11.8 10*3/uL — AB (ref 4.6–10.2)

## 2012-09-10 LAB — POCT UA - MICROSCOPIC ONLY
Casts, Ur, LPF, POC: NEGATIVE
Crystals, Ur, HPF, POC: NEGATIVE
Mucus, UA: NEGATIVE
Yeast, UA: NEGATIVE

## 2012-09-10 MED ORDER — HYDROCODONE-ACETAMINOPHEN 5-325 MG PO TABS: 1.0000 | ORAL_TABLET | Freq: Four times a day (QID) | ORAL | Status: DC | PRN

## 2012-09-10 MED ORDER — PHENAZOPYRIDINE HCL 100 MG PO TABS: 100.0000 mg | ORAL_TABLET | Freq: Three times a day (TID) | ORAL | Status: DC | PRN

## 2012-09-10 MED ORDER — LEVOFLOXACIN 750 MG PO TABS: 750.0000 mg | ORAL_TABLET | Freq: Every day | ORAL | Status: DC

## 2012-09-10 NOTE — Progress Notes (Signed)
Subjective:    Patient ID: Laura Smith, female    DOB: September 15, 1960, 52 y.o.   MRN: [id number]  HPI Laura Smith is a 52 y.o. female Started yesterday about 3pm. Urinary frequency, lower abdominal pain, and pain radiates to R back.   No fever. Last BM this am - normal. No n/v/f/c. No known hx of kidney stones.  Hx of interstitial cystitis - usually less pain and less timing. No urologist. Diagnosed by Melrose Nakayama.   Postmenopausal.  Appendectomy in 1992.   tried monistat last night - but no relief and no discharge.   Review of Systems  Constitutional: Positive for chills. Negative for fever.  Gastrointestinal: Positive for abdominal pain. Negative for nausea, vomiting, diarrhea and blood in stool.  Genitourinary: Positive for urgency, frequency, hematuria (noticed with wiping after urinating last night. no blood in toilet. ) and pelvic pain. Negative for dysuria (sore to urinate, but not burning. ), vaginal bleeding, vaginal discharge and menstrual problem.  Skin: Negative for rash.       Objective:   Physical Exam  Vitals reviewed. Constitutional: She is oriented to person, place, and time. She appears well-developed and well-nourished.  HENT:  Head: Normocephalic and atraumatic.  Pulmonary/Chest: Effort normal.  Abdominal: Soft. Normal appearance. She exhibits no distension. There is tenderness in the suprapubic area. There is CVA tenderness (R sided. ). There is no rebound and no guarding. No hernia.  Neurological: She is alert and oriented to person, place, and time.  Skin: Skin is warm.  Psychiatric: She has a normal mood and affect. Her behavior is normal.     Results for orders placed in visit on 09/10/12  POCT URINALYSIS DIPSTICK      Result Value Range   Color, UA yellow     Clarity, UA cloudy     Glucose, UA neg     Bilirubin, UA neg     Ketones, UA neg     Spec Grav, UA 1.015     Blood, UA moderate     pH, UA 6.0     Protein, UA 100     Urobilinogen, UA 0.2      Nitrite, UA positive     Leukocytes, UA moderate (2+)    POCT UA - MICROSCOPIC ONLY      Result Value Range   WBC, Ur, HPF, POC tntc     RBC, urine, microscopic 0-2     Bacteria, U Microscopic 1+     Mucus, UA neg     Epithelial cells, urine per micros 0-1     Crystals, Ur, HPF, POC neg     Casts, Ur, LPF, POC neg     Yeast, UA neg    POCT CBC      Result Value Range   WBC 11.8 (*) 4.6 - 10.2 K/uL   Lymph, poc 2.5  0.6 - 3.4   POC LYMPH PERCENT 21.6  10 - 50 %L   MID (cbc) 0.8  0 - 0.9   POC MID % 7.1  0 - 12 %M   POC Granulocyte 8.4 (*) 2 - 6.9   Granulocyte percent 71.3  37 - 80 %G   RBC 4.75  4.04 - 5.48 M/uL   Hemoglobin 13.4  12.2 - 16.2 g/dL   HCT, POC 78.2  95.6 - 47.9 %   MCV 88.9  80 - 97 fL   MCH, POC 28.2  27 - 31.2 pg   MCHC 31.8  31.8 - 35.4  g/dL   RDW, POC 16.1     Platelet Count, POC 333  142 - 424 K/uL   MPV 8.6  0 - 99.8 fL      Assessment & Plan:  Laura Smith is a 52 y.o. female  Abdominal pain, unspecified site - Plan: POCT urinalysis dipstick, POCT UA - Microscopic Only  Back pain - Plan: POCT urinalysis dipstick, POCT UA - Microscopic Only, POCT CBC, Urine culture, levofloxacin (LEVAQUIN) 750 MG tablet  UTI (urinary tract infection) - Plan: levofloxacin (LEVAQUIN) 750 MG tablet, phenazopyridine (PYRIDIUM) 100 MG tablet  Interstitial cystitis - Plan: HYDROcodone-acetaminophen (NORCO/VICODIN) 5-325 MG per tablet  UTI - with radiation to R low back.  ddx includes early pyelo.  Start levaquin 750mg  QD for 5 days, fluids, azo, lortab if needed (underlying interstitial cystitis), check culture. Recheck tomorrow if any worsening, or in 2-3 days if not improving. Rtc/er precautions.   Patient Instructions  Start the antibiotic and other instructions below for possible early bladder to kidney infection.   Recheck next 2-3 days if not improving.  Return to the clinic or go to the nearest emergency room if any of your symptoms worsen or new symptoms  occur. Pyelonephritis, Adult Pyelonephritis is a kidney infection. In general, there are 2 main types of pyelonephritis:  Infections that come on quickly without any warning (acute pyelonephritis).  Infections that persist for a long period of time (chronic pyelonephritis). CAUSES  Two main causes of pyelonephritis are:  Bacteria traveling from the bladder to the kidney. This is a problem especially in pregnant women. The urine in the bladder can become filled with bacteria from multiple causes, including:  Inflammation of the prostate gland (prostatitis).  Sexual intercourse in females.  Bladder infection (cystitis).  Bacteria traveling from the bloodstream to the tissue part of the kidney. Problems that may increase your risk of getting a kidney infection include:  Diabetes.  Kidney stones or bladder stones.  Cancer.  Catheters placed in the bladder.  Other abnormalities of the kidney or ureter. SYMPTOMS   Abdominal pain.  Pain in the side or flank area.  Fever.  Chills.  Upset stomach.  Blood in the urine (dark urine).  Frequent urination.  Strong or persistent urge to urinate.  Burning or stinging when urinating. DIAGNOSIS  Your caregiver may diagnose your kidney infection based on your symptoms. A urine sample may also be taken. TREATMENT  In general, treatment depends on how severe the infection is.   If the infection is mild and caught early, your caregiver may treat you with oral antibiotics and send you home.  If the infection is more severe, the bacteria may have gotten into the bloodstream. This will require intravenous (IV) antibiotics and a hospital stay. Symptoms may include:  High fever.  Severe flank pain.  Shaking chills.  Even after a hospital stay, your caregiver may require you to be on oral antibiotics for a period of time.  Other treatments may be required depending upon the cause of the infection. HOME CARE INSTRUCTIONS    Take your antibiotics as directed. Finish them even if you start to feel better.  Make an appointment to have your urine checked to make sure the infection is gone.  Drink enough fluids to keep your urine clear or pale yellow.  Take medicines for the bladder if you have urgency and frequency of urination as directed by your caregiver. SEEK IMMEDIATE MEDICAL CARE IF:   You have a fever or persistent symptoms  for more than 2-3 days.  You have a fever and your symptoms suddenly get worse.  You are unable to take your antibiotics or fluids.  You develop shaking chills.  You experience extreme weakness or fainting.  There is no improvement after 2 days of treatment. MAKE SURE YOU:  Understand these instructions.  Will watch your condition.  Will get help right away if you are not doing well or get worse. Document Released: 06/08/2005 Document Revised: 12/08/2011 Document Reviewed: 11/12/2010 [facility] Patient Information 2013 [place], [place].    Meds ordered this encounter  Medications  . levofloxacin (LEVAQUIN) 750 MG tablet    Sig: Take 1 tablet (750 mg total) by mouth daily.    Dispense:  5 tablet    Refill:  0  . phenazopyridine (PYRIDIUM) 100 MG tablet    Sig: Take 1 tablet (100 mg total) by mouth 3 (three) times daily as needed for pain.    Dispense:  10 tablet    Refill:  0  . HYDROcodone-acetaminophen (NORCO/VICODIN) 5-325 MG per tablet    Sig: Take 1 tablet by mouth every 6 (six) hours as needed for pain.    Dispense:  10 tablet    Refill:  0

## 2012-09-10 NOTE — Patient Instructions (Signed)
Start the antibiotic and other instructions below for possible early bladder to kidney infection.   Recheck next 2-3 days if not improving.  Return to the clinic or go to the nearest emergency room if any of your symptoms worsen or new symptoms occur. Pyelonephritis, Adult Pyelonephritis is a kidney infection. In general, there are 2 main types of pyelonephritis:  Infections that come on quickly without any warning (acute pyelonephritis).  Infections that persist for a long period of time (chronic pyelonephritis). CAUSES  Two main causes of pyelonephritis are:  Bacteria traveling from the bladder to the kidney. This is a problem especially in pregnant women. The urine in the bladder can become filled with bacteria from multiple causes, including:  Inflammation of the prostate gland (prostatitis).  Sexual intercourse in females.  Bladder infection (cystitis).  Bacteria traveling from the bloodstream to the tissue part of the kidney. Problems that may increase your risk of getting a kidney infection include:  Diabetes.  Kidney stones or bladder stones.  Cancer.  Catheters placed in the bladder.  Other abnormalities of the kidney or ureter. SYMPTOMS   Abdominal pain.  Pain in the side or flank area.  Fever.  Chills.  Upset stomach.  Blood in the urine (dark urine).  Frequent urination.  Strong or persistent urge to urinate.  Burning or stinging when urinating. DIAGNOSIS  Your caregiver may diagnose your kidney infection based on your symptoms. A urine sample may also be taken. TREATMENT  In general, treatment depends on how severe the infection is.   If the infection is mild and caught early, your caregiver may treat you with oral antibiotics and send you home.  If the infection is more severe, the bacteria may have gotten into the bloodstream. This will require intravenous (IV) antibiotics and a hospital stay. Symptoms may include:  High fever.  Severe flank  pain.  Shaking chills.  Even after a hospital stay, your caregiver may require you to be on oral antibiotics for a period of time.  Other treatments may be required depending upon the cause of the infection. HOME CARE INSTRUCTIONS   Take your antibiotics as directed. Finish them even if you start to feel better.  Make an appointment to have your urine checked to make sure the infection is gone.  Drink enough fluids to keep your urine clear or pale yellow.  Take medicines for the bladder if you have urgency and frequency of urination as directed by your caregiver. SEEK IMMEDIATE MEDICAL CARE IF:   You have a fever or persistent symptoms for more than 2-3 days.  You have a fever and your symptoms suddenly get worse.  You are unable to take your antibiotics or fluids.  You develop shaking chills.  You experience extreme weakness or fainting.  There is no improvement after 2 days of treatment. MAKE SURE YOU:  Understand these instructions.  Will watch your condition.  Will get help right away if you are not doing well or get worse. Document Released: 06/08/2005 Document Revised: 12/08/2011 Document Reviewed: 11/12/2010 [facility] Patient Information 2013 [place], [place].

## 2012-09-13 LAB — URINE CULTURE: Colony Count: >=100000

## 2012-09-15 ENCOUNTER — Ambulatory Visit (INDEPENDENT_AMBULATORY_CARE_PROVIDER_SITE_OTHER): Payer: 59 | Admitting: Internal Medicine

## 2012-09-15 ENCOUNTER — Encounter: Payer: Self-pay | Admitting: Internal Medicine

## 2012-09-15 VITALS — BP 128/90 | HR 81 | Temp 97.8°F | Wt 223.0 lb

## 2012-09-15 DIAGNOSIS — R300 Dysuria: Secondary | ICD-10-CM

## 2012-09-15 DIAGNOSIS — M549 Dorsalgia, unspecified: Secondary | ICD-10-CM

## 2012-09-15 DIAGNOSIS — N12 Tubulo-interstitial nephritis, not specified as acute or chronic: Secondary | ICD-10-CM | POA: Insufficient documentation

## 2012-09-15 DIAGNOSIS — N3010 Interstitial cystitis (chronic) without hematuria: Secondary | ICD-10-CM | POA: Insufficient documentation

## 2012-09-15 LAB — POCT URINALYSIS DIPSTICK
Bilirubin, UA: NEGATIVE
Blood, UA: NEGATIVE
Glucose, UA: NEGATIVE
Ketones, UA: NEGATIVE
Leukocytes, UA: NEGATIVE
Nitrite, UA: NEGATIVE
Spec Grav, UA: >=1.03
Urobilinogen, UA: 0.2
pH, UA: 5.5

## 2012-09-15 MED ORDER — MONTELUKAST SODIUM 10 MG PO TABS: 10.0000 mg | ORAL_TABLET | Freq: Every day | ORAL | Status: DC

## 2012-09-15 MED ORDER — LEVOFLOXACIN 750 MG PO TABS: 750.0000 mg | ORAL_TABLET | Freq: Every day | ORAL | Status: DC

## 2012-09-15 NOTE — Progress Notes (Signed)
Chief Complaint  Patient presents with  . Follow-up    Was seen in the urgent care last Saturday.  Was given an antibiotic but she cannot recall the name of the medication.    HPI: Patient comes in today for SDA for  problem evaluation. Was seen in [facility] March 22 or so for abd pain flank pain and feeling bad and dx with pyelonephritis and given levaquin fro 5 days   And narcotic pain med  Off for 2 days still not right  abd pain although dysuria is better .  No hematuria  Now has left flank pain .  No fever chills   Had fu appt tomorrow but felt too bacd today  Missed work this week so far until today cause feels tired and nauseous and ow left  ROS: See pertinent positives and negatives per HPI. No vomiting diarrhea  faseh  Edema hx not returned ( see 1 14 appt)   Past Medical History  Diagnosis Date  . Death of family member     HUSBAND DIED AT AGE 68 MI  . Migraine   . IC (interstitial cystitis)   . Hemorrhoids   . Hypertension   . Endometriosis     surgery laser 2001  . Hx of basal cell carcinoma     Family History  Problem Relation Age of Onset  . Cancer Mother 71    COLON  . Breast cancer Mother     Age 50  . Heart disease Father   . Hypertension Paternal Grandmother   . Heart disease Paternal Grandmother   . Stroke Paternal Grandmother   . Hypertension Paternal Grandfather   . Heart disease Paternal Grandfather   . Cancer Paternal Grandfather     Prostate  . Breast cancer Cousin     Age 54's  . Stroke Maternal Grandfather     History   Social History  . Marital Status: Married    Spouse Name: N/A    Number of Children: N/A  . Years of Education: N/A   Social History Main Topics  . Smoking status: Never Smoker   . Smokeless tobacco: None  . Alcohol Use: No  . Drug Use: No  . Sexually Active: No   Other Topics Concern  . None   Social History Narrative   h h of 3    g1 p1   Works [employer]     Outpatient Encounter  Prescriptions as of 09/15/2012  Medication Sig Dispense Refill  . Ascorbic Acid (VITAMIN C) 500 MG CAPS Take by mouth.      Marland Kitchen b complex vitamins tablet Take 1 tablet by mouth daily.        Marland Kitchen CALCIUM-MAGNESIUM PO Take by mouth.      . Cetirizine HCl (ZYRTEC PO) Take by mouth.        . hydrochlorothiazide 25 MG tablet Take 25 mg by mouth daily.        Marland Kitchen HYDROcodone-acetaminophen (NORCO/VICODIN) 5-325 MG per tablet Take 1 tablet by mouth every 6 (six) hours as needed for pain.  10 tablet  0  . HYDROMORPHONE HCL PO Take by mouth.        . montelukast (SINGULAIR) 10 MG tablet Take 1 tablet (10 mg total) by mouth at bedtime.  90 tablet  1  . Multiple Vitamins-Iron (MULTIVITAMIN/IRON) TABS Take by mouth.        . phenazopyridine (PYRIDIUM) 100 MG tablet Take 1  tablet (100 mg total) by mouth 3 (three) times daily as needed for pain.  10 tablet  0  . SUMAtriptan (IMITREX) 6 MG/0.5ML SOLN injection Inject 6 mg into the skin every 2 (two) hours as needed. F      . verapamil (COVERA HS) 180 MG (CO) 24 hr tablet Take 180 mg by mouth at bedtime.        Marland Kitchen ZOLMitriptan (ZOMIG NA) Place into the nose.      . [DISCONTINUED] montelukast (SINGULAIR) 10 MG tablet Take 10 mg by mouth at bedtime.        Marland Kitchen levofloxacin (LEVAQUIN) 750 MG tablet Take 1 tablet (750 mg total) by mouth daily.  5 tablet  0  . [DISCONTINUED] albuterol (PROAIR HFA) 108 (90 BASE) MCG/ACT inhaler Inhale 2 puffs into the lungs every 6 (six) hours as needed for wheezing.  1 Inhaler  1  . [DISCONTINUED] chlorpheniramine-HYDROcodone (TUSSIONEX) 10-8 MG/5ML LQCR Take 5 mLs by mouth every 12 (twelve) hours as needed (for cough ).  60 mL  0  . [DISCONTINUED] levofloxacin (LEVAQUIN) 750 MG tablet Take 1 tablet (750 mg total) by mouth daily.  5 tablet  0   No facility-administered encounter medications on file as of 09/15/2012.    EXAM:  BP 128/90  Pulse 81  Temp(Src) 97.8 F (36.6 C) (Oral)  Wt 223 lb (101.152 kg)  BMI 33.91 kg/m2  SpO2  98%  Body mass index is 33.91 kg/(m^2).  GENERAL: vitals reviewed and listed above, alert, oriented, appears well hydrated and in no acute distress non toxic looks tired   HEENT: atraumatic, conjunctiva  clear, no obvious abnormalities on inspection of external nose and ears NECK: no obvious masses on inspection palpation   LUNGS: clear to auscultation bilaterally, no wheezes, rales or rhonchi, good air movement Abdomen:  Sof,t normal bowel sounds without hepatosplenomegaly, no guarding rebound or masses  Mild left cva tenderness  And right to med suprapubic tendernss with out masses  No psoas sign  CV: HRRR, no clubbing cyanosis nl cap refill   MS: moves all extremities without noticeable focal  abnormality PSYCH: pleasant and cooperative, no obvious depression or anxiety  ASSESSMENT AND PLAN:  Discussed the following assessment and plan:  Back pain - Plan: POCT urinalysis dipstick  Pyelonephritis - e coli pan sensitive improved but still flank pain and rlq p?  - Plan: Ambulatory referral to Urology  Dysuria - Plan: POCT urinalysis dipstick  Interstitial cystitis - given dx by gyne doesnt remember how dx made and never saw urologist  no hx of stones  if pain continuing  needs evaluation - Plan: Ambulatory referral to Urology I suppose some of her symptoms could be side effect of medication but I doubt it because she had this at the onset of her illness. Her abdominal pain seems out of proportion to her UTI but seems to be related. -Patient advised to return or notify health care team  if symptoms worsen or persist or new concerns arise.  Patient Instructions  Urine has improved however    I think treating for 5 more days is in order because of the severity of our symptoms  Also  Will do urology referral because of your dx of interstitial cystitis and the about of pain you are having.  Labs from last visit were good. Sign up for my chart to see the results.      Neta Mends. Panosh  M.D.

## 2012-09-15 NOTE — Patient Instructions (Signed)
Urine has improved however    I think treating for 5 more days is in order because of the severity of our symptoms  Also  Will do urology referral because of your dx of interstitial cystitis and the about of pain you are having.  Labs from last visit were good. Sign up for my chart to see the results.

## 2012-09-16 ENCOUNTER — Ambulatory Visit: Payer: 59 | Admitting: Internal Medicine

## 2012-09-20 LAB — HM MAMMOGRAPHY: HM Mammogram: NORMAL

## 2012-11-20 ENCOUNTER — Encounter: Payer: Self-pay | Admitting: Family Medicine

## 2012-11-20 ENCOUNTER — Ambulatory Visit (INDEPENDENT_AMBULATORY_CARE_PROVIDER_SITE_OTHER): Payer: 59 | Admitting: Family Medicine

## 2012-11-20 ENCOUNTER — Encounter (HOSPITAL_COMMUNITY): Payer: Self-pay | Admitting: *Deleted

## 2012-11-20 ENCOUNTER — Emergency Department (HOSPITAL_COMMUNITY): Payer: 59

## 2012-11-20 ENCOUNTER — Emergency Department (HOSPITAL_COMMUNITY)
Admission: EM | Admit: 2012-11-20 | Discharge: 2012-11-20 | Disposition: A | Discharge status: Home / Self Care | Payer: 59 | Attending: Emergency Medicine | Admitting: Emergency Medicine

## 2012-11-20 VITALS — BP 132/90 | HR 82 | Temp 97.9°F | Resp 18

## 2012-11-20 DIAGNOSIS — R110 Nausea: Secondary | ICD-10-CM | POA: Insufficient documentation

## 2012-11-20 DIAGNOSIS — I10 Essential (primary) hypertension: Secondary | ICD-10-CM

## 2012-11-20 DIAGNOSIS — Z79899 Other long term (current) drug therapy: Secondary | ICD-10-CM | POA: Insufficient documentation

## 2012-11-20 DIAGNOSIS — R51 Headache: Secondary | ICD-10-CM

## 2012-11-20 DIAGNOSIS — Z87448 Personal history of other diseases of urinary system: Secondary | ICD-10-CM | POA: Insufficient documentation

## 2012-11-20 DIAGNOSIS — R079 Chest pain, unspecified: Secondary | ICD-10-CM | POA: Insufficient documentation

## 2012-11-20 DIAGNOSIS — Z91040 Latex allergy status: Secondary | ICD-10-CM | POA: Insufficient documentation

## 2012-11-20 DIAGNOSIS — G43909 Migraine, unspecified, not intractable, without status migrainosus: Secondary | ICD-10-CM | POA: Insufficient documentation

## 2012-11-20 DIAGNOSIS — R42 Dizziness and giddiness: Secondary | ICD-10-CM

## 2012-11-20 DIAGNOSIS — R61 Generalized hyperhidrosis: Secondary | ICD-10-CM | POA: Insufficient documentation

## 2012-11-20 DIAGNOSIS — Z8679 Personal history of other diseases of the circulatory system: Secondary | ICD-10-CM | POA: Insufficient documentation

## 2012-11-20 DIAGNOSIS — R0602 Shortness of breath: Secondary | ICD-10-CM | POA: Insufficient documentation

## 2012-11-20 DIAGNOSIS — Z85828 Personal history of other malignant neoplasm of skin: Secondary | ICD-10-CM | POA: Insufficient documentation

## 2012-11-20 LAB — POCT CBC
Granulocyte percent: 60.3 %G (ref 37–80)
HCT, POC: 43.7 % (ref 37.7–47.9)
Hemoglobin: 13.5 g/dL (ref 12.2–16.2)
Lymph, poc: 2.6 (ref 0.6–3.4)
MCH, POC: 28.2 pg (ref 27–31.2)
MCHC: 30.9 g/dL — AB (ref 31.8–35.4)
MCV: 91.3 fL (ref 80–97)
MID (cbc): 0.5 (ref 0–0.9)
MPV: 9.7 fL (ref 0–99.8)
POC Granulocyte: 4.8 (ref 2–6.9)
POC LYMPH PERCENT: 32.8 %L (ref 10–50)
POC MID %: 6.9 %M (ref 0–12)
Platelet Count, POC: 305 10*3/uL (ref 142–424)
RBC: 4.79 M/uL (ref 4.04–5.48)
RDW, POC: 14.9 %
WBC: 7.9 10*3/uL (ref 4.6–10.2)

## 2012-11-20 LAB — CBC WITH DIFFERENTIAL/PLATELET
Basophils Absolute: 0 10*3/uL (ref 0.0–0.1)
Basophils Relative: 0 % (ref 0–1)
Eosinophils Absolute: 0.2 10*3/uL (ref 0.0–0.7)
Eosinophils Relative: 3 % (ref 0–5)
HCT: 41.3 % (ref 36.0–46.0)
Hemoglobin: 13.6 g/dL (ref 12.0–15.0)
Lymphocytes Relative: 30 % (ref 12–46)
Lymphs Abs: 2.3 10*3/uL (ref 0.7–4.0)
MCH: 28.2 pg (ref 26.0–34.0)
MCHC: 32.9 g/dL (ref 30.0–36.0)
MCV: 85.7 fL (ref 78.0–100.0)
Monocytes Absolute: 0.5 10*3/uL (ref 0.1–1.0)
Monocytes Relative: 7 % (ref 3–12)
Neutro Abs: 4.6 10*3/uL (ref 1.7–7.7)
Neutrophils Relative %: 60 % (ref 43–77)
Platelets: 282 10*3/uL (ref 150–400)
RBC: 4.82 MIL/uL (ref 3.87–5.11)
RDW: 13.7 % (ref 11.5–15.5)
WBC: 7.6 10*3/uL (ref 4.0–10.5)

## 2012-11-20 LAB — BASIC METABOLIC PANEL
BUN: 15 mg/dL (ref 6–23)
CO2: 30 mEq/L (ref 19–32)
Calcium: 9.7 mg/dL (ref 8.4–10.5)
Chloride: 105 mEq/L (ref 96–112)
Creatinine, Ser: 0.81 mg/dL (ref 0.50–1.10)
GFR calc Af Amer: >90 mL/min (ref >90)
GFR calc non Af Amer: 82 mL/min — ABNORMAL LOW (ref >90)
Glucose, Bld: 103 mg/dL — ABNORMAL HIGH (ref 70–99)
Potassium: 3.9 mEq/L (ref 3.5–5.1)
Sodium: 140 mEq/L (ref 135–145)

## 2012-11-20 LAB — POCT I-STAT TROPONIN I: Troponin i, poc: 0.01 ng/mL (ref 0.00–0.08)

## 2012-11-20 LAB — GLUCOSE, POCT (MANUAL RESULT ENTRY): POC Glucose: 78 mg/dl (ref 70–99)

## 2012-11-20 MED ORDER — ASPIRIN 81 MG PO CHEW
324.0000 mg | CHEWABLE_TABLET | Freq: Once | ORAL | Status: AC
  Administered 2012-11-20: 324 mg via ORAL
  Filled 2012-11-20: qty 4

## 2012-11-20 MED ORDER — LORAZEPAM 2 MG/ML IJ SOLN
1.0000 mg | Freq: Once | INTRAMUSCULAR | Status: AC
  Administered 2012-11-20: 1 mg via INTRAVENOUS
  Filled 2012-11-20: qty 1

## 2012-11-20 MED ORDER — NITROGLYCERIN 0.4 MG SL SUBL: 0.4000 mg | SUBLINGUAL_TABLET | SUBLINGUAL | Status: DC | PRN

## 2012-11-20 MED ORDER — MECLIZINE HCL 50 MG PO TABS: 50.0000 mg | ORAL_TABLET | Freq: Two times a day (BID) | ORAL | Status: DC | PRN

## 2012-11-20 NOTE — ED Notes (Signed)
PA at bedside.

## 2012-11-20 NOTE — Patient Instructions (Signed)
-   sent by ems to ER-

## 2012-11-20 NOTE — Progress Notes (Signed)
Subjective:    Patient ID: Laura Smith, female    DOB: 08/24/1960, 52 y.o.   MRN: [id number]  HPI Laura Smith is a 52 y.o. female Hx of HTN, other as below. Presents acutely with feeling real lightheaded and dizzy.  Riding in car around 9:30am, felt dizzy out of the blue, lightheaded. Tried to eat - felt nauseous. Did not improve dizziness. Sweaty since dizziness started, Has felt chest tightness past 15 minutes - center of chest- heaviness, pressure - feels like something sitting on her chest. , no neck/shoulder radiation. Still feels nauseous. Less sweaty. Feels like has to take a deep breath to get air - feels like not getting enough air.  No slurred speech, no focal weakness, generalized off balance. Chest pressure 5/10. Room is spinning. No hx of vertigo, or similar sx's prior.    Forgot dose of verapamil and HCTZ yesterday but has taken this morning.   FH: mom with SVT, father with MI at 72yo.   SH: no alcohol.   Past Medical History  Diagnosis Date  . Death of family member     HUSBAND DIED AT AGE 68 MI  . Migraine   . IC (interstitial cystitis)   . Hemorrhoids   . Hypertension   . Endometriosis     surgery laser 2001  . Hx of basal cell carcinoma    Past Surgical History  Procedure Laterality Date  . Appendectomy  1992    AT C/S  . Cesarean section  1992    APPENDECTOMY SAME TIME  . Pelvic laparoscopy  08/1999    AND HYSTEROSCOPY--ENDOMETRIOSIS  . Hysteroscopy  08/1999    AT TIME OF LAPAROSCOPY    Allergies  Allergen Reactions  . Other     Gel used for ultrasound.  . Adhesive (Tape) Rash  . Latex Rash   Prior to Admission medications   Medication Sig Start Date End Date Taking? Authorizing Provider  Ascorbic Acid (VITAMIN C) 500 MG CAPS Take by mouth.    Historical Provider, MD  b complex vitamins tablet Take 1 tablet by mouth daily.      Historical Provider, MD  CALCIUM-MAGNESIUM PO Take by mouth.    Historical Provider, MD  Cetirizine HCl (ZYRTEC  PO) Take by mouth.      Historical Provider, MD  hydrochlorothiazide 25 MG tablet Take 25 mg by mouth daily.      Historical Provider, MD  HYDROcodone-acetaminophen (NORCO/VICODIN) 5-325 MG per tablet Take 1 tablet by mouth every 6 (six) hours as needed for pain. 09/10/12   Shade Flood, MD  HYDROMORPHONE HCL PO Take by mouth.      Historical Provider, MD  levofloxacin (LEVAQUIN) 750 MG tablet Take 1 tablet (750 mg total) by mouth daily. 09/15/12   Madelin Headings, MD  montelukast (SINGULAIR) 10 MG tablet Take 1 tablet (10 mg total) by mouth at bedtime. 09/15/12   Madelin Headings, MD  Multiple Vitamins-Iron (MULTIVITAMIN/IRON) TABS Take by mouth.      Historical Provider, MD  phenazopyridine (PYRIDIUM) 100 MG tablet Take 1 tablet (100 mg total) by mouth 3 (three) times daily as needed for pain. 09/10/12   Shade Flood, MD  SUMAtriptan (IMITREX) 6 MG/0.5ML SOLN injection Inject 6 mg into the skin every 2 (two) hours as needed. F    Historical Provider, MD  verapamil (COVERA HS) 180 MG (CO) 24 hr tablet Take 180 mg by mouth at bedtime.      Historical Provider, MD  ZOLMitriptan (ZOMIG NA) Place into the nose.    Historical Provider, MD   History   Social History  . Marital Status: Married    Spouse Name: N/A    Number of Children: N/A  . Years of Education: N/A   Occupational History  . Not on file.   Social History Main Topics  . Smoking status: Never Smoker   . Smokeless tobacco: Not on file  . Alcohol Use: No  . Drug Use: No  . Sexually Active: No   Other Topics Concern  . Not on file   Social History Narrative   h h of 3    g1 p1   Works [employer]       Review of Systems  Constitutional: Positive for diaphoresis. Negative for fever and chills.  Respiratory: Positive for chest tightness and shortness of breath.   Cardiovascular: Positive for chest pain. Negative for palpitations and leg swelling.  Gastrointestinal: Positive for nausea.  Negative for vomiting.  Skin: Negative for rash.  Neurological: Positive for dizziness, light-headedness and headaches (slight. hx of migraines. ). Negative for syncope, facial asymmetry, speech difficulty and weakness.       Objective:   Physical Exam  Nursing note and vitals reviewed. Constitutional: She is oriented to person, place, and time. She appears well-developed and well-nourished. She appears distressed (appears anxious, but not toxic. ).  HENT:  Head: Normocephalic and atraumatic.  Eyes: Conjunctivae are normal. Pupils are equal, round, and reactive to light. Right eye exhibits nystagmus ( 1 beat nystagmus to left with slight dizziness. ). Left eye exhibits nystagmus.  Neck: Carotid bruit is not present.  Cardiovascular: Normal rate, regular rhythm, normal heart sounds and intact distal pulses.   Pulmonary/Chest: Effort normal and breath sounds normal.  Abdominal: Soft. She exhibits no pulsatile midline mass. There is no tenderness.  Neurological: She is alert and oriented to person, place, and time. She has normal strength. No cranial nerve deficit or sensory deficit. Gait abnormal. GCS eye subscore is 4. GCS verbal subscore is 5. GCS motor subscore is 6.  Unsteady with sitting up, slow gait.  Equal facial movements.   Skin: Skin is warm and dry. She is not diaphoretic.  Psychiatric: She has a normal mood and affect. Her behavior is normal.   EKG: SR, ?old anterior infract. No acute findings otherwise, but no prior EKG available for review.  Results for orders placed in visit on 11/20/12  POCT CBC      Result Value Range   WBC 7.9  4.6 - 10.2 K/uL   Lymph, poc 2.6  0.6 - 3.4   POC LYMPH PERCENT 32.8  10 - 50 %L   MID (cbc) 0.5  0 - 0.9   POC MID % 6.9  0 - 12 %M   POC Granulocyte 4.8  2 - 6.9   Granulocyte percent 60.3  37 - 80 %G   RBC 4.79  4.04 - 5.48 M/uL   Hemoglobin 13.5  12.2 - 16.2 g/dL   HCT, POC 81.1  91.4 - 47.9 %   MCV 91.3  80 - 97 fL   MCH, POC 28.2   27 - 31.2 pg   MCHC 30.9 (*) 31.8 - 35.4 g/dL   RDW, POC 78.2     Platelet Count, POC 305  142 - 424 K/uL   MPV 9.7  0 - 99.8 fL  GLUCOSE, POCT (MANUAL RESULT ENTRY)  Result Value Range   POC Glucose 78  70 - 99 mg/dl       Assessment & Plan:   Laura Smith is a 52 y.o. female Lightheaded - Plan: EKG 12-Lead  Chest pain - Plan: EKG 12-Lead  Dizziness  Headache(784.0)  HTN (hypertension)  HX of HTN, FH of CAD, with acute dizziness/lightheadedness, nausea, and diaphoresis.  Onset at 9:30 this am, with subsequent development of chest pain/pressure -5/10. Possible old anterior infarct on EKG, but no prior EKG available for review. DDX vertigo, vs ICB/CVA (no focal neuro findings other than unsteadiness) vs cardiac/ACS.  Cardiac risk factors of HTN, and possible early fh CAS in father. Will transport to ER for eval   Placed on monitor (remained in sinus rhythm), IV placed, O2nc at [facility], EMS called for transport approx [phone]. Report given to EMS/transfer of care at 1138, and charge nurse at [facility] advised. No aspirin -

## 2012-11-20 NOTE — ED Provider Notes (Signed)
History     CSN: [id number]  Arrival date & time 11/20/12  1210   First MD Initiated Contact with Patient 11/20/12 1215      Chief Complaint  Patient presents with  . Dizziness    (Consider location/radiation/quality/duration/timing/severity/associated sxs/prior treatment) HPI Comments: Patient presents emergency department with chief complaint of chest pain. She states that approximately 9:30 this morning she became dizzy, and began to have chest pain, shortness of breath, and diaphoresis. She also endorses some nausea. She went to the urgent care to be evaluated, and was sent to the emergency department. Patient also reports a headache. She did not receive any aspirin or sublingual nitroglycerin. She states that the chest pressure is a 3/10, and that she is still mildly short of breath. She has not taken anything to alleviate her symptoms. She has never felt like this before. There was no exertional component.  The history is provided by the patient. No language interpreter was used.    Past Medical History  Diagnosis Date  . Death of family member     HUSBAND DIED AT AGE 31 MI  . Migraine   . IC (interstitial cystitis)   . Hemorrhoids   . Hypertension   . Endometriosis     surgery laser 2001  . Hx of basal cell carcinoma     Past Surgical History  Procedure Laterality Date  . Appendectomy  1992    AT C/S  . Cesarean section  1992    APPENDECTOMY SAME TIME  . Pelvic laparoscopy  08/1999    AND HYSTEROSCOPY--ENDOMETRIOSIS  . Hysteroscopy  08/1999    AT TIME OF LAPAROSCOPY    Family History  Problem Relation Age of Onset  . Cancer Mother 73    COLON  . Breast cancer Mother     Age 26  . Heart disease Father   . Hypertension Paternal Grandmother   . Heart disease Paternal Grandmother   . Stroke Paternal Grandmother   . Hypertension Paternal Grandfather   . Heart disease Paternal Grandfather   . Cancer Paternal Grandfather     Prostate  . Breast cancer Cousin      Age 68's  . Stroke Maternal Grandfather     History  Substance Use Topics  . Smoking status: Never Smoker   . Smokeless tobacco: Not on file  . Alcohol Use: No    OB History   Grav Para Term Preterm Abortions TAB SAB Ect Mult Living   1 1 1       1       Review of Systems  All other systems reviewed and are negative.    Allergies  Other; Adhesive; and Latex  Home Medications   Current Outpatient Rx  Name  Route  Sig  Dispense  Refill  . Ascorbic Acid (VITAMIN C) 500 MG CAPS   Oral   Take by mouth.         Marland Kitchen b complex vitamins tablet   Oral   Take 1 tablet by mouth daily.           Marland Kitchen CALCIUM-MAGNESIUM PO   Oral   Take by mouth.         . Cetirizine HCl (ZYRTEC PO)   Oral   Take by mouth.           . hydrochlorothiazide 25 MG tablet   Oral   Take 25 mg by mouth daily.           Marland Kitchen HYDROcodone-acetaminophen (NORCO/VICODIN)  5-325 MG per tablet   Oral   Take 1 tablet by mouth every 6 (six) hours as needed for pain.   10 tablet   0   . HYDROMORPHONE HCL PO   Oral   Take by mouth.           . levofloxacin (LEVAQUIN) 750 MG tablet   Oral   Take 1 tablet (750 mg total) by mouth daily.   5 tablet   0   . montelukast (SINGULAIR) 10 MG tablet   Oral   Take 1 tablet (10 mg total) by mouth at bedtime.   90 tablet   1   . Multiple Vitamins-Iron (MULTIVITAMIN/IRON) TABS   Oral   Take by mouth.           . phenazopyridine (PYRIDIUM) 100 MG tablet   Oral   Take 1 tablet (100 mg total) by mouth 3 (three) times daily as needed for pain.   10 tablet   0   . SUMAtriptan (IMITREX) 6 MG/0.5ML SOLN injection   Subcutaneous   Inject 6 mg into the skin every 2 (two) hours as needed. F         . verapamil (COVERA HS) 180 MG (CO) 24 hr tablet   Oral   Take 180 mg by mouth at bedtime.           Marland Kitchen ZOLMitriptan (ZOMIG NA)   Nasal   Place into the nose.           SpO2 97%  Physical Exam  Nursing note and vitals  reviewed. Constitutional: She is oriented to person, place, and time. She appears well-developed and well-nourished.  Patient is speaking in smooth complete sentences no slurring  HENT:  Head: Normocephalic and atraumatic.  Right Ear: External ear normal.  Left Ear: External ear normal.  Non-tender over temporal artery, no increased pain with chewing.  Eyes: Conjunctivae and EOM are normal. Pupils are equal, round, and reactive to light.  No papilledema  Neck: Normal range of motion. Neck supple.  No pain with neck flexion, no meningismus  Cardiovascular: Normal rate, regular rhythm and normal heart sounds.  Exam reveals no gallop and no friction rub.   No murmur heard. Pulmonary/Chest: Effort normal and breath sounds normal. No respiratory distress. She has no wheezes. She has no rales. She exhibits no tenderness.  Abdominal: Soft. Bowel sounds are normal. She exhibits no distension and no mass. There is no tenderness. There is no rebound and no guarding.  Musculoskeletal: Normal range of motion. She exhibits no edema and no tenderness.  Normal gait.  Neurological: She is alert and oriented to person, place, and time. She has normal reflexes.  CN 3-12 intact, normal finger to nose bilaterally, no pronator drift  Skin: Skin is warm and dry.  Psychiatric: She has a normal mood and affect. Her behavior is normal. Judgment and thought content normal.    ED Course  Procedures (including critical care time)  Labs Reviewed  CBC WITH DIFFERENTIAL  BASIC METABOLIC PANEL   Results for orders placed during the hospital encounter of 11/20/12  CBC WITH DIFFERENTIAL      Result Value Range   WBC 7.6  4.0 - 10.5 K/uL   RBC 4.82  3.87 - 5.11 MIL/uL   Hemoglobin 13.6  12.0 - 15.0 g/dL   HCT 57.8  46.9 - 62.9 %   MCV 85.7  78.0 - 100.0 fL   MCH 28.2  26.0 - 34.0 pg  MCHC 32.9  30.0 - 36.0 g/dL   RDW 16.1  09.6 - 04.5 %   Platelets 282  150 - 400 K/uL   Neutrophils Relative % 60  43 - 77 %    Neutro Abs 4.6  1.7 - 7.7 K/uL   Lymphocytes Relative 30  12 - 46 %   Lymphs Abs 2.3  0.7 - 4.0 K/uL   Monocytes Relative 7  3 - 12 %   Monocytes Absolute 0.5  0.1 - 1.0 K/uL   Eosinophils Relative 3  0 - 5 %   Eosinophils Absolute 0.2  0.0 - 0.7 K/uL   Basophils Relative 0  0 - 1 %   Basophils Absolute 0.0  0.0 - 0.1 K/uL  BASIC METABOLIC PANEL      Result Value Range   Sodium 140  135 - 145 mEq/L   Potassium 3.9  3.5 - 5.1 mEq/L   Chloride 105  96 - 112 mEq/L   CO2 30  19 - 32 mEq/L   Glucose, Bld 103 (*) 70 - 99 mg/dL   BUN 15  6 - 23 mg/dL   Creatinine, Ser 4.09  0.50 - 1.10 mg/dL   Calcium 9.7  8.4 - 81.1 mg/dL   GFR calc non Af Amer 82 (*) >90 mL/min   GFR calc Af Amer >90  >90 mL/min  POCT I-STAT TROPONIN I      Result Value Range   Troponin i, poc 0.01  0.00 - 0.08 ng/mL   Comment 3            Dg Chest Portable 1 View  11/20/2012   *RADIOLOGY REPORT*  Clinical Data: Dizziness.  Shortness of breath and chest pain.  PORTABLE CHEST - 1 VIEW  Comparison: None.  Findings: The heart size is normal.  The lungs are clear.  The visualized soft tissues and bony thorax are unremarkable.  IMPRESSION: No acute cardiopulmonary disease.   Original Report Authenticated By: Marin Roberts, M.D.    ED ECG REPORT  I personally interpreted this EKG   Date: 11/20/2012   Rate: 68  Rhythm: normal sinus rhythm  QRS Axis: normal  Intervals: normal  ST/T Wave abnormalities: normal  Conduction Disutrbances:none  Narrative Interpretation:   Old EKG Reviewed: none available    1. Vertigo   2. Chest pain       MDM  The patient with the dizziness that began this morning around 9:30. She reports the dizziness as though the room is spinning. She states it is worsened when she moves her head fast. She also endorses some chest pain, shortness of breath, and diaphoresis. Her lab work today is unremarkable. Normal chest x-ray, normal EKG, negative troponin. Patient seen by and discussed  with Dr. Patria Mane, who tells me that the patient is low risk for ACS, only risk factor is HTN.  Patient is an anxious person, suspect that this could be a contributing factor.  Patient is chest pain free in the ED.  Will treat the vertigo with meclizine.  Given ativan in the ED with some improvement.  Dr. Patria Mane has seen the patient and reviewed the workup and labs today.  Will discharge with instructions to follow up with PCP and cardiology.  Patient is stable and ready for discharge.        Roxy Horseman, PA-C 11/20/12 1614

## 2012-11-20 NOTE — ED Notes (Signed)
MD at bedside. 

## 2012-11-20 NOTE — ED Provider Notes (Signed)
Medical screening examination/treatment/procedure(s) were conducted as a shared visit with non-physician practitioner(s) and myself.  I personally evaluated the patient during the encounter  The patient is feeling somewhat better after Ativan.  Her symptoms are consistent with peripheral vertigo.  Doubt central vertigo.  No chest pain this time.  EKG and troponin are normal.  The majority chest pain sounds more likely related to anxiety after she developed what sounds like severe vertiginous symptoms.  Normal neurologic exam at this time.  Her symptoms worse with rapid movements of her head.  She feels better after benzodiazepines.  Home with meclizine   Lyanne Co, MD 11/20/12 [phone]

## 2012-11-20 NOTE — ED Notes (Signed)
Per EMS- pt had dizziness that started at 0930 today. Pt states that she went to urgent care and by then was having central chest pain, diaphoresis, and nausea that have since resolved. Pt also reports headache. Pt did not received Asprin or nitro due to MD concerned that pt may need rule out head CT.

## 2012-11-23 ENCOUNTER — Ambulatory Visit (INDEPENDENT_AMBULATORY_CARE_PROVIDER_SITE_OTHER): Payer: 59 | Admitting: Internal Medicine

## 2012-11-23 ENCOUNTER — Encounter: Payer: Self-pay | Admitting: Internal Medicine

## 2012-11-23 VITALS — BP 132/90 | HR 78 | Temp 98.3°F | Ht 68.0 in | Wt 223.0 lb

## 2012-11-23 DIAGNOSIS — H8110 Benign paroxysmal vertigo, unspecified ear: Secondary | ICD-10-CM

## 2012-11-23 NOTE — Patient Instructions (Signed)

## 2012-11-23 NOTE — Progress Notes (Signed)
Subjective:    Patient ID: Laura Smith, female    DOB: 06-01-1961, 52 y.o.   MRN: [id number]  HPI  Pt presents to the clinic today with c/o vertigo. This started Sunday morning. She went to Urgent Care. They did have some concern for ACS. They sent her to the ER where cardiac workup was negative. It was felt that her dizziness was related to BPPV, and the chest pain was secondary to anxiety from the vertigo episode. She was given a benzodiazepine in the ER and felt much better. She was sent home with Meclizine. Since being home, she is feeling less dizzy but more tired. She still feels like she is unable to return to work. She has never had vertigo before and has questions regarding how long it will take to resolve. She has not had any chest pain, chest tightness or shortness of breath.  Review of Systems   Past Medical History  Diagnosis Date  . Death of family member     HUSBAND DIED AT AGE 56 MI  . Migraine   . IC (interstitial cystitis)   . Hemorrhoids   . Hypertension   . Endometriosis     surgery laser 2001  . Hx of basal cell carcinoma     Current Outpatient Prescriptions  Medication Sig Dispense Refill  . Ascorbic Acid (VITAMIN C) 500 MG CAPS Take 1,000 mg by mouth every morning.       Marland Kitchen b complex vitamins tablet Take 1 tablet by mouth daily.        Marland Kitchen CALCIUM-MAGNESIUM PO Take 3 tablets by mouth daily.       . cetirizine (ZYRTEC) 10 MG tablet Take 10 mg by mouth daily.      . hydrochlorothiazide 25 MG tablet Take 25 mg by mouth daily.        . meclizine (ANTIVERT) 50 MG tablet Take 1 tablet (50 mg total) by mouth 2 (two) times daily as needed.  30 tablet  0  . montelukast (SINGULAIR) 10 MG tablet Take 10 mg by mouth daily as needed (for seasonal allergies, uses as needed).       . Multiple Vitamins-Iron (MULTIVITAMIN/IRON) TABS Take 1 tablet by mouth daily.       . SUMAtriptan (IMITREX) 6 MG/0.5ML SOLN injection Inject 6 mg into the skin every 2 (two) hours as needed for  migraine. F      . verapamil (COVERA HS) 180 MG (CO) 24 hr tablet Take 180 mg by mouth daily with lunch.       Marland Kitchen ZOLMitriptan (ZOMIG NA) Place 1 spray into the nose daily as needed (for migraines).        No current facility-administered medications for this visit.    Allergies  Allergen Reactions  . Latex Hives, Rash and Other (See Comments)    Causes blisters  . Other Hives, Rash and Other (See Comments)    Gel used for ultrasound-causes blisters also   . Adhesive (Tape) Itching and Rash    Family History  Problem Relation Age of Onset  . Cancer Mother 22    COLON  . Breast cancer Mother     Age 11  . Heart disease Father   . Hypertension Paternal Grandmother   . Heart disease Paternal Grandmother   . Stroke Paternal Grandmother   . Hypertension Paternal Grandfather   . Heart disease Paternal Grandfather   . Cancer Paternal Grandfather     Prostate  . Breast cancer Cousin  Age 72's  . Stroke Maternal Grandfather     History   Social History  . Marital Status: Married    Spouse Name: N/A    Number of Children: N/A  . Years of Education: N/A   Occupational History  . Not on file.   Social History Main Topics  . Smoking status: Never Smoker   . Smokeless tobacco: Not on file  . Alcohol Use: No  . Drug Use: No  . Sexually Active: No   Other Topics Concern  . Not on file   Social History Narrative   h h of 3    g1 p1   Works [employer]      Constitutional: Denies fever, malaise, fatigue, headache or abrupt weight changes.  HEENT: Denies eye pain, eye redness, ear pain, ringing in the ears, wax buildup, runny nose, nasal congestion, bloody nose, or sore throat. Respiratory: Denies difficulty breathing, shortness of breath, cough or sputum production.   Cardiovascular: Denies chest pain, chest tightness, palpitations or swelling in the hands or feet.  Neurological: Pt reports dizziness. Denies difficulty with memory,  difficulty with speech or problems with balance and coordination.   No other specific complaints in a complete review of systems (except as listed in HPI above).  Objective:   Physical Exam   BP 132/90  Pulse 78  Temp(Src) 98.3 F (36.8 C) (Oral)  Ht 5\' 8"  (1.727 m)  Wt 223 lb (101.152 kg)  BMI 33.91 kg/m2  SpO2 96% Wt Readings from Last 3 Encounters:  11/23/12 223 lb (101.152 kg)  09/15/12 223 lb (101.152 kg)  09/10/12 225 lb (102.059 kg)    General: Appears her stated age, well developed, well nourished in NAD. Cardiovascular: Normal rate and rhythm. S1,S2 noted.  No murmur, rubs or gallops noted. No JVD or BLE edema. No carotid bruits noted. Pulmonary/Chest: Normal effort and positive vesicular breath sounds. No respiratory distress. No wheezes, rales or ronchi noted.  Neurological: Alert and oriented. Cranial nerves II-XII intact. Coordination normal. +DTRs bilaterally. Negative Rhomberg.   BMET    Component Value Date/Time   NA 140 11/20/2012 1346   K 3.9 11/20/2012 1346   CL 105 11/20/2012 1346   CO2 30 11/20/2012 1346   GLUCOSE 103* 11/20/2012 1346   BUN 15 11/20/2012 1346   CREATININE 0.81 11/20/2012 1346   CALCIUM 9.7 11/20/2012 1346   GFRNONAA 82* 11/20/2012 1346   GFRAA >90 11/20/2012 1346    Lipid Panel  No results found for this basename: chol, trig, hdl, cholhdl, vldl, ldlcalc    CBC    Component Value Date/Time   WBC 7.6 11/20/2012 1346   WBC 7.9 11/20/2012 1131   RBC 4.82 11/20/2012 1346   RBC 4.79 11/20/2012 1131   HGB 13.6 11/20/2012 1346   HGB 13.5 11/20/2012 1131   HCT 41.3 11/20/2012 1346   HCT 43.7 11/20/2012 1131   PLT 282 11/20/2012 1346   MCV 85.7 11/20/2012 1346   MCV 91.3 11/20/2012 1131   MCH 28.2 11/20/2012 1346   MCH 28.2 11/20/2012 1131   MCHC 32.9 11/20/2012 1346   MCHC 30.9* 11/20/2012 1131   RDW 13.7 11/20/2012 1346   LYMPHSABS 2.3 11/20/2012 1346   MONOABS 0.5 11/20/2012 1346   EOSABS 0.2 11/20/2012 1346   BASOSABS 0.0 11/20/2012 1346    Hgb A1C No results found for  this basename: HGBA1C        Assessment & Plan:  BPPV, better:  Reassurance given that this can take some time to resolve Continue the Meclizine Drink plenty of fluids and stay well hydrated Make position changes slowly  RTC if symptoms persist through the weekend or get worse

## 2013-02-04 ENCOUNTER — Encounter (HOSPITAL_COMMUNITY): Payer: Self-pay | Admitting: Emergency Medicine

## 2013-02-04 ENCOUNTER — Emergency Department (HOSPITAL_COMMUNITY)
Admission: EM | Admit: 2013-02-04 | Discharge: 2013-02-05 | Disposition: A | Discharge status: Home / Self Care | Payer: 59 | Attending: Emergency Medicine | Admitting: Emergency Medicine

## 2013-02-04 DIAGNOSIS — M549 Dorsalgia, unspecified: Secondary | ICD-10-CM | POA: Insufficient documentation

## 2013-02-04 DIAGNOSIS — Z79899 Other long term (current) drug therapy: Secondary | ICD-10-CM | POA: Insufficient documentation

## 2013-02-04 DIAGNOSIS — Z91040 Latex allergy status: Secondary | ICD-10-CM | POA: Insufficient documentation

## 2013-02-04 DIAGNOSIS — Z87448 Personal history of other diseases of urinary system: Secondary | ICD-10-CM | POA: Insufficient documentation

## 2013-02-04 DIAGNOSIS — Z8679 Personal history of other diseases of the circulatory system: Secondary | ICD-10-CM | POA: Insufficient documentation

## 2013-02-04 DIAGNOSIS — L299 Pruritus, unspecified: Secondary | ICD-10-CM | POA: Insufficient documentation

## 2013-02-04 DIAGNOSIS — G43909 Migraine, unspecified, not intractable, without status migrainosus: Secondary | ICD-10-CM | POA: Insufficient documentation

## 2013-02-04 DIAGNOSIS — R21 Rash and other nonspecific skin eruption: Secondary | ICD-10-CM | POA: Insufficient documentation

## 2013-02-04 DIAGNOSIS — Z8742 Personal history of other diseases of the female genital tract: Secondary | ICD-10-CM | POA: Insufficient documentation

## 2013-02-04 DIAGNOSIS — I10 Essential (primary) hypertension: Secondary | ICD-10-CM | POA: Insufficient documentation

## 2013-02-04 DIAGNOSIS — T7840XA Allergy, unspecified, initial encounter: Secondary | ICD-10-CM

## 2013-02-04 DIAGNOSIS — Z85828 Personal history of other malignant neoplasm of skin: Secondary | ICD-10-CM | POA: Insufficient documentation

## 2013-02-04 LAB — CBC
HCT: 40.8 % (ref 36.0–46.0)
Hemoglobin: 13.7 g/dL (ref 12.0–15.0)
MCH: 28.6 pg (ref 26.0–34.0)
MCHC: 33.6 g/dL (ref 30.0–36.0)
MCV: 85.2 fL (ref 78.0–100.0)
Platelets: 282 10*3/uL (ref 150–400)
RBC: 4.79 MIL/uL (ref 3.87–5.11)
RDW: 13.6 % (ref 11.5–15.5)
WBC: 14.2 10*3/uL — ABNORMAL HIGH (ref 4.0–10.5)

## 2013-02-04 LAB — URINALYSIS, ROUTINE W REFLEX MICROSCOPIC
Glucose, UA: NEGATIVE mg/dL
Hgb urine dipstick: NEGATIVE
Ketones, ur: NEGATIVE mg/dL
Leukocytes, UA: NEGATIVE
Nitrite: NEGATIVE
Protein, ur: NEGATIVE mg/dL
Specific Gravity, Urine: 1.029 (ref 1.005–1.030)
Urobilinogen, UA: 0.2 mg/dL (ref 0.0–1.0)
pH: 5.5 (ref 5.0–8.0)

## 2013-02-04 MED ORDER — METHYLPREDNISOLONE SODIUM SUCC 125 MG IJ SOLR
125.0000 mg | Freq: Once | INTRAMUSCULAR | Status: AC
  Administered 2013-02-04: 125 mg via INTRAVENOUS
  Filled 2013-02-04: qty 2

## 2013-02-04 MED ORDER — DIPHENHYDRAMINE HCL 50 MG/ML IJ SOLN
25.0000 mg | Freq: Once | INTRAMUSCULAR | Status: AC
  Administered 2013-02-04: 25 mg via INTRAVENOUS
  Filled 2013-02-04: qty 1

## 2013-02-04 MED ORDER — FAMOTIDINE IN NACL 20-0.9 MG/50ML-% IV SOLN
20.0000 mg | Freq: Once | INTRAVENOUS | Status: AC
  Administered 2013-02-04: 20 mg via INTRAVENOUS
  Filled 2013-02-04 (×2): qty 50

## 2013-02-04 NOTE — ED Notes (Signed)
Patient reports that she took a medication that she was perscribed a few month ago. The patient reports that she has has hot flashes, chills, bumps all over and itching all over.

## 2013-02-04 NOTE — ED Provider Notes (Signed)
CSN: [id number]     Arrival date & time 02/04/13  1950 History     First MD Initiated Contact with Patient 02/04/13 2132     Chief Complaint  Patient presents with  . Allergic Reaction  . Back Pain   (Consider location/radiation/quality/duration/timing/severity/associated sxs/prior Treatment) Patient is a 52 y.o. female presenting with allergic reaction and back pain. The history is provided by the patient.  Allergic Reaction Presenting symptoms: rash   Presenting symptoms: no wheezing   Back Pain Associated symptoms: no abdominal pain, no chest pain, no dysuria, no fever, no headaches and no pelvic pain   pt c/o possible allergic reaction. Pt indicates this morning back was stiff/sore so she took a dose of etodolac that she had left from several months ago. States 1 hr later broke out in rash all over, pruritic,  erythematous, trunk and extremities. No palms or soles. No mm involvement. Symptoms persistent/constant since onset. No hx same symptoms in past. States had taken this medication several months ago for single week without adverse rxn. No other prior drug allergies. Denies any recent febrile illness. No cold/uri c/o. No dysuria, urgency, pelvic or flank pain, or gu c/o. No nvd. Pt denies any other recent new med use, incl otc meds. No change in home or personal products. No ingestion. Denies throat swelling or closing. No dyspnea or wheezing.  States has not taken anything all day for rash/itching, as was afraid to.  No fevers or sweats, states feels chilled now.     Past Medical History  Diagnosis Date  . Death of family member     HUSBAND DIED AT AGE 6 MI  . Migraine   . IC (interstitial cystitis)   . Hemorrhoids   . Hypertension   . Endometriosis     surgery laser 2001  . Hx of basal cell carcinoma    Past Surgical History  Procedure Laterality Date  . Appendectomy  1992    AT C/S  . Cesarean section  1992    APPENDECTOMY SAME TIME  . Pelvic laparoscopy  08/1999     AND HYSTEROSCOPY--ENDOMETRIOSIS  . Hysteroscopy  08/1999    AT TIME OF LAPAROSCOPY   Family History  Problem Relation Age of Onset  . Cancer Mother 10    COLON  . Breast cancer Mother     Age 71  . Heart disease Father   . Hypertension Paternal Grandmother   . Heart disease Paternal Grandmother   . Stroke Paternal Grandmother   . Hypertension Paternal Grandfather   . Heart disease Paternal Grandfather   . Cancer Paternal Grandfather     Prostate  . Breast cancer Cousin     Age 66's  . Stroke Maternal Grandfather    History  Substance Use Topics  . Smoking status: Never Smoker   . Smokeless tobacco: Not on file  . Alcohol Use: No   OB History   Grav Para Term Preterm Abortions TAB SAB Ect Mult Living   1 1 1       1      Review of Systems  Constitutional: Negative for fever.  HENT: Negative for neck pain and neck stiffness.   Eyes: Negative for pain and redness.  Respiratory: Negative for cough, shortness of breath and wheezing.   Cardiovascular: Negative for chest pain and leg swelling.  Gastrointestinal: Negative for vomiting, abdominal pain and diarrhea.  Genitourinary: Negative for dysuria, flank pain and pelvic pain.  Musculoskeletal: Positive for back pain. Negative for myalgias  and joint swelling.  Skin: Positive for rash.  Neurological: Negative for headaches.  Hematological: Does not bruise/bleed easily.  Psychiatric/Behavioral: Negative for confusion.    Allergies  Latex; Other; and Adhesive  Home Medications   Current Outpatient Rx  Name  Route  Sig  Dispense  Refill  . Ascorbic Acid (VITAMIN C) 500 MG CAPS   Oral   Take 1,000 mg by mouth every morning.          Marland Kitchen b complex vitamins tablet   Oral   Take 1 tablet by mouth daily.           Marland Kitchen CALCIUM-MAGNESIUM PO   Oral   Take 3 tablets by mouth daily.          . cetirizine (ZYRTEC) 10 MG tablet   Oral   Take 10 mg by mouth daily.         Marland Kitchen etodolac (LODINE) 400 MG tablet    Oral   Take 400 mg by mouth 2 (two) times daily.         . hydrochlorothiazide 25 MG tablet   Oral   Take 25 mg by mouth daily.          . meclizine (ANTIVERT) 50 MG tablet   Oral   Take 1 tablet (50 mg total) by mouth 2 (two) times daily as needed.   30 tablet   0   . Multiple Vitamins-Iron (MULTIVITAMIN/IRON) TABS   Oral   Take 1 tablet by mouth daily.          . verapamil (COVERA HS) 180 MG (CO) 24 hr tablet   Oral   Take 180 mg by mouth daily with lunch.          . montelukast (SINGULAIR) 10 MG tablet   Oral   Take 10 mg by mouth daily as needed (for seasonal allergies, uses as needed).          . SUMAtriptan (IMITREX) 6 MG/0.5ML SOLN injection   Subcutaneous   Inject 6 mg into the skin every 2 (two) hours as needed for migraine. F         . ZOLMitriptan (ZOMIG NA)   Nasal   Place 1 spray into the nose daily as needed (for migraines).           BP 155/71  Pulse 113  Temp(Src) 98.9 F (37.2 C) (Oral)  Resp 21  Ht 5\' 7"  (1.702 m)  Wt 223 lb (101.152 kg)  BMI 34.92 kg/m2  SpO2 97% Physical Exam  Nursing note and vitals reviewed. Constitutional: She is oriented to person, place, and time. She appears well-developed and well-nourished. No distress.  HENT:  Nose: Nose normal.  Mouth/Throat: Oropharynx is clear and moist.  No mm lesions or rash  Eyes: Conjunctivae are normal. No scleral icterus.  Neck: Normal range of motion. Neck supple. No tracheal deviation present.  No stiffness or rigidity  Cardiovascular: Normal rate, regular rhythm, normal heart sounds and intact distal pulses.   Pulmonary/Chest: Effort normal and breath sounds normal. No respiratory distress.  Abdominal: Soft. Normal appearance and bowel sounds are normal. She exhibits no distension and no mass. There is no tenderness. There is no rebound and no guarding.  Genitourinary:  No cva tenderness  Musculoskeletal: She exhibits no edema and no tenderness.  Neurological: She is  alert and oriented to person, place, and time.  Skin: Skin is warm and dry. She is not diaphoretic.  Erythematous rash to trunk and head,  w blotchy/patchy involvement of bilateral extremities.  No discrete target or iris lesions. No urticaria. +blanches. No petechia or purpuric lesions. No palms or soles. No mm lesions.   Psychiatric: She has a normal mood and affect.    ED Course   Procedures (including critical care time)  Results for orders placed during the hospital encounter of 02/04/13  URINALYSIS, ROUTINE W REFLEX MICROSCOPIC      Result Value Range   Color, Urine YELLOW  YELLOW   APPearance CLOUDY (*) CLEAR   Specific Gravity, Urine 1.029  1.005 - 1.030   pH 5.5  5.0 - 8.0   Glucose, UA NEGATIVE  NEGATIVE mg/dL   Hgb urine dipstick NEGATIVE  NEGATIVE   Bilirubin Urine LARGE (*) NEGATIVE   Ketones, ur NEGATIVE  NEGATIVE mg/dL   Protein, ur NEGATIVE  NEGATIVE mg/dL   Urobilinogen, UA 0.2  0.0 - 1.0 mg/dL   Nitrite NEGATIVE  NEGATIVE   Leukocytes, UA NEGATIVE  NEGATIVE  CBC      Result Value Range   WBC 14.2 (*) 4.0 - 10.5 K/uL   RBC 4.79  3.87 - 5.11 MIL/uL   Hemoglobin 13.7  12.0 - 15.0 g/dL   HCT 40.9  81.1 - 91.4 %   MCV 85.2  78.0 - 100.0 fL   MCH 28.6  26.0 - 34.0 pg   MCHC 33.6  30.0 - 36.0 g/dL   RDW 78.2  95.6 - 21.3 %   Platelets 282  150 - 400 K/uL  COMPREHENSIVE METABOLIC PANEL      Result Value Range   Sodium 133 (*) 135 - 145 mEq/L   Potassium 3.5  3.5 - 5.1 mEq/L   Chloride 98  96 - 112 mEq/L   CO2 22  19 - 32 mEq/L   Glucose, Bld 162 (*) 70 - 99 mg/dL   BUN 13  6 - 23 mg/dL   Creatinine, Ser 0.86  0.50 - 1.10 mg/dL   Calcium 8.6  8.4 - 57.8 mg/dL   Total Protein 6.6  6.0 - 8.3 g/dL   Albumin 3.4 (*) 3.5 - 5.2 g/dL   AST 18  0 - 37 U/L   ALT 15  0 - 35 U/L   Alkaline Phosphatase 71  39 - 117 U/L   Total Bilirubin 0.4  0.3 - 1.2 mg/dL   GFR calc non Af Amer >90  >90 mL/min   GFR calc Af Amer >90  >90 mL/min      MDM  Benadryl iv,  solumedrol iv. pepcid iv.  Labs.  Reviewed nursing notes and prior charts for additional history.   Recheck, pt feels much improved, rash/itching improved.  Recheck again, symptoms resolved. Afeb. No rash. Lfts/renal fxn normal.  Pt appears stable for d/c home.  pcp f/u Monday.     Suzi Roots, MD 02/05/13 [phone]

## 2013-02-05 LAB — COMPREHENSIVE METABOLIC PANEL
ALT: 15 U/L (ref 0–35)
AST: 18 U/L (ref 0–37)
Albumin: 3.4 g/dL — ABNORMAL LOW (ref 3.5–5.2)
Alkaline Phosphatase: 71 U/L (ref 39–117)
BUN: 13 mg/dL (ref 6–23)
CO2: 22 mEq/L (ref 19–32)
Calcium: 8.6 mg/dL (ref 8.4–10.5)
Chloride: 98 mEq/L (ref 96–112)
Creatinine, Ser: 0.79 mg/dL (ref 0.50–1.10)
GFR calc Af Amer: >90 mL/min (ref >90)
GFR calc non Af Amer: >90 mL/min (ref >90)
Glucose, Bld: 162 mg/dL — ABNORMAL HIGH (ref 70–99)
Potassium: 3.5 mEq/L (ref 3.5–5.1)
Sodium: 133 mEq/L — ABNORMAL LOW (ref 135–145)
Total Bilirubin: 0.4 mg/dL (ref 0.3–1.2)
Total Protein: 6.6 g/dL (ref 6.0–8.3)

## 2013-02-05 MED ORDER — DIPHENHYDRAMINE HCL 25 MG PO TABS: 25.0000 mg | ORAL_TABLET | Freq: Four times a day (QID) | ORAL | Status: DC | PRN

## 2013-02-05 NOTE — ED Notes (Signed)
Pt here for allergic reaction to etodolac she stated taken on an empty stomach. Rash to face, hand andbody

## 2013-02-06 ENCOUNTER — Encounter: Payer: Self-pay | Admitting: Internal Medicine

## 2013-02-06 ENCOUNTER — Ambulatory Visit (INDEPENDENT_AMBULATORY_CARE_PROVIDER_SITE_OTHER): Payer: 59 | Admitting: Internal Medicine

## 2013-02-06 VITALS — BP 126/84 | HR 79 | Temp 98.1°F | Wt 228.0 lb

## 2013-02-06 DIAGNOSIS — L508 Other urticaria: Secondary | ICD-10-CM

## 2013-02-06 DIAGNOSIS — T7589XS Other specified effects of external causes, sequela: Secondary | ICD-10-CM

## 2013-02-06 DIAGNOSIS — T7840XS Allergy, unspecified, sequela: Secondary | ICD-10-CM

## 2013-02-06 DIAGNOSIS — T788XXS Other adverse effects, not elsewhere classified, sequela: Secondary | ICD-10-CM

## 2013-02-06 DIAGNOSIS — L509 Urticaria, unspecified: Secondary | ICD-10-CM

## 2013-02-06 MED ORDER — PREDNISONE 10 MG PO TABS: ORAL_TABLET | ORAL | Status: DC

## 2013-02-06 NOTE — Progress Notes (Signed)
Chief Complaint  Patient presents with  . Follow-up    Post hospital follow up    HPI: Patient come in for follow up from ED visit 8 16 14   See ed visit  Had almost sudden onset of rash and flushing and itching  Blood red like burned in sun and vomited  After taking etodalac  after About an hour.   Had some throat sx  Like closing or ticke but no other sob . No fever st otherwise  To ed 8 16 .  Allergic to latex  Bandage. But no exposures known . Has had etodolac in past for ms spasm pain and no problem  Given benadryl in ed  As well as iv steroid and pepcid    And taking every 6 hours at home.   Still itching  Back and head some redness on froarms and bums and back? No current rep gi illness   Had back pain shoulder blades and  Hips area. Hx of back pain. ROS: See pertinent positives and negatives per HPI. No current cp sob  resp sx . No hx of similar rx .  Had traveled locally for anniversary weekend but no new exposures  Past Medical History  Diagnosis Date  . Death of family member     HUSBAND DIED AT AGE 57 MI  . Migraine   . IC (interstitial cystitis)   . Hemorrhoids   . Hypertension   . Endometriosis     surgery laser 2001  . Hx of basal cell carcinoma     Family History  Problem Relation Age of Onset  . Cancer Mother 12    COLON  . Breast cancer Mother     Age 41  . Heart disease Father   . Hypertension Paternal Grandmother   . Heart disease Paternal Grandmother   . Stroke Paternal Grandmother   . Hypertension Paternal Grandfather   . Heart disease Paternal Grandfather   . Cancer Paternal Grandfather     Prostate  . Breast cancer Cousin     Age 1's  . Stroke Maternal Grandfather     History   Social History  . Marital Status: Married    Spouse Name: N/A    Number of Children: N/A  . Years of Education: N/A   Social History Main Topics  . Smoking status: Never Smoker   . Smokeless tobacco: None  . Alcohol Use: No  . Drug Use: No  . Sexual  Activity: No   Other Topics Concern  . None   Social History Narrative   h h of 3    g1 p1   Works [employer]     Outpatient Encounter Prescriptions as of 02/06/2013  Medication Sig Dispense Refill  . Ascorbic Acid (VITAMIN C) 500 MG CAPS Take 1,000 mg by mouth every morning.       Marland Kitchen b complex vitamins tablet Take 1 tablet by mouth daily.        Marland Kitchen CALCIUM-MAGNESIUM PO Take 3 tablets by mouth daily.       . cetirizine (ZYRTEC) 10 MG tablet Take 10 mg by mouth daily.      . diphenhydrAMINE (BENADRYL) 25 MG tablet Take 1 tablet (25 mg total) by mouth every 6 (six) hours as needed for itching.  20 tablet  0  . hydrochlorothiazide 25 MG tablet Take 25 mg by mouth daily.       . meclizine (ANTIVERT) 50  MG tablet Take 1 tablet (50 mg total) by mouth 2 (two) times daily as needed.  30 tablet  0  . montelukast (SINGULAIR) 10 MG tablet Take 10 mg by mouth daily as needed (for seasonal allergies, uses as needed).       . Multiple Vitamins-Iron (MULTIVITAMIN/IRON) TABS Take 1 tablet by mouth daily.       . SUMAtriptan (IMITREX) 6 MG/0.5ML SOLN injection Inject 6 mg into the skin every 2 (two) hours as needed for migraine. F      . verapamil (COVERA HS) 180 MG (CO) 24 hr tablet Take 180 mg by mouth daily with lunch.       Marland Kitchen ZOLMitriptan (ZOMIG NA) Place 1 spray into the nose daily as needed (for migraines).       . predniSONE (DELTASONE) 10 MG tablet Take pills per day,6,6,6,4,4,4,2,2,2,1,1,1  40 tablet  0   No facility-administered encounter medications on file as of 02/06/2013.    EXAM:  BP 126/84  Pulse 79  Temp(Src) 98.1 F (36.7 C) (Oral)  Wt 228 lb (103.42 kg)  BMI 35.7 kg/m2  SpO2 98%  Body mass index is 35.7 kg/(m^2).  GENERAL: vitals reviewed and listed above, alert, oriented, appears well hydrated and in no acute distress non toxic  Some redness of forarms  scratching some HEENT: atraumatic, conjunctiva  clear, no obvious abnormalities on  inspection of external nose and ears OP : no lesion edema or exudate  NECK: no obvious masses on inspection palpation  No adenopathy LUNGS: clear to auscultation bilaterally, no wheezes, rales or rhonchi, good air movement CV: HRRR, no clubbing cyanosis or  peripheral edema nl cap refill  MS: moves all extremities without noticeable focal  Abnormality no edema   diffuse erythema aback  Some small hive reaction on forearms face ok palms clear  No  PSYCH: pleasant and cooperative, no obvious depression or anxiety Lab Results  Component Value Date   WBC 14.2* 02/04/2013   HGB 13.7 02/04/2013   HCT 40.8 02/04/2013   PLT 282 02/04/2013   GLUCOSE 162* 02/04/2013   ALT 15 02/04/2013   AST 18 02/04/2013   NA 133* 02/04/2013   K 3.5 02/04/2013   CL 98 02/04/2013   CREATININE 0.79 02/04/2013   BUN 13 02/04/2013   CO2 22 02/04/2013   TSH 1.82 07/06/2012   Uncertain if labs done pre or post steroid  And iv  ASSESSMENT AND PLAN:  Discussed the following assessment and plan:  Allergic reaction, sequela - prob from drug etodalac based on timing  tolerated in past   Full body hives  Ed visit reviewed  Timing and context  Etodolac seems culprit .   No signs of infection.  Uncertain if other nsaids would cause the same as she has had them in past without problem .  At this time  Add pred taper 12 days but can taper faster if doing well .  Antihistamine blockers 1 and 2  Close observation if needed.    Expectant management.  -Patient advised to return or notify health care team  if symptoms worsen or persist or new concerns arise.  Patient Instructions  At this time  i think you are still having an allergic reaction.  Add prednisone   continue benadryl 25 mg - 50 mg every 4-6 hours . Add  pepcid one twice a day.  For now  Recheck in a     Week or so  Or as needed.  [name]. Panosh M.D.

## 2013-02-06 NOTE — Patient Instructions (Addendum)
At this time  i think you are still having an allergic reaction.  Add prednisone   continue benadryl 25 mg - 50 mg every 4-6 hours . Add  pepcid one twice a day.  For now  Recheck in a     Week or so  Or as needed.

## 2013-02-09 ENCOUNTER — Encounter: Payer: Self-pay | Admitting: Internal Medicine

## 2013-02-10 ENCOUNTER — Ambulatory Visit: Payer: 59 | Admitting: Internal Medicine

## 2013-02-15 ENCOUNTER — Telehealth: Payer: Self-pay | Admitting: Internal Medicine

## 2013-02-15 NOTE — Telephone Encounter (Signed)
noted 

## 2013-02-15 NOTE — Telephone Encounter (Signed)
Pt states she was to report to you how she is doing. Pt states she is much better. Pt has finished all meds and has stopped itching. Pt cancelled fup, no need, she is good!!

## 2013-02-17 ENCOUNTER — Ambulatory Visit: Payer: 59 | Admitting: Internal Medicine

## 2013-03-16 ENCOUNTER — Emergency Department (HOSPITAL_COMMUNITY)
Admission: EM | Admit: 2013-03-16 | Discharge: 2013-03-16 | Disposition: A | Discharge status: Home / Self Care | Payer: 59 | Attending: Emergency Medicine | Admitting: Emergency Medicine

## 2013-03-16 ENCOUNTER — Emergency Department (HOSPITAL_COMMUNITY): Payer: 59

## 2013-03-16 ENCOUNTER — Telehealth: Payer: Self-pay | Admitting: Internal Medicine

## 2013-03-16 ENCOUNTER — Encounter (HOSPITAL_COMMUNITY): Payer: Self-pay | Admitting: Emergency Medicine

## 2013-03-16 DIAGNOSIS — Y92410 Unspecified street and highway as the place of occurrence of the external cause: Secondary | ICD-10-CM | POA: Insufficient documentation

## 2013-03-16 DIAGNOSIS — I10 Essential (primary) hypertension: Secondary | ICD-10-CM | POA: Insufficient documentation

## 2013-03-16 DIAGNOSIS — Z888 Allergy status to other drugs, medicaments and biological substances status: Secondary | ICD-10-CM | POA: Insufficient documentation

## 2013-03-16 DIAGNOSIS — M542 Cervicalgia: Secondary | ICD-10-CM | POA: Insufficient documentation

## 2013-03-16 DIAGNOSIS — Z85828 Personal history of other malignant neoplasm of skin: Secondary | ICD-10-CM | POA: Insufficient documentation

## 2013-03-16 DIAGNOSIS — Z8669 Personal history of other diseases of the nervous system and sense organs: Secondary | ICD-10-CM | POA: Insufficient documentation

## 2013-03-16 DIAGNOSIS — Z8742 Personal history of other diseases of the female genital tract: Secondary | ICD-10-CM | POA: Insufficient documentation

## 2013-03-16 DIAGNOSIS — M549 Dorsalgia, unspecified: Secondary | ICD-10-CM

## 2013-03-16 DIAGNOSIS — M79609 Pain in unspecified limb: Secondary | ICD-10-CM | POA: Insufficient documentation

## 2013-03-16 DIAGNOSIS — Z79899 Other long term (current) drug therapy: Secondary | ICD-10-CM | POA: Insufficient documentation

## 2013-03-16 DIAGNOSIS — Z91040 Latex allergy status: Secondary | ICD-10-CM | POA: Insufficient documentation

## 2013-03-16 DIAGNOSIS — Y9389 Activity, other specified: Secondary | ICD-10-CM | POA: Insufficient documentation

## 2013-03-16 DIAGNOSIS — M79605 Pain in left leg: Secondary | ICD-10-CM

## 2013-03-16 DIAGNOSIS — R51 Headache: Secondary | ICD-10-CM | POA: Insufficient documentation

## 2013-03-16 DIAGNOSIS — Z8719 Personal history of other diseases of the digestive system: Secondary | ICD-10-CM | POA: Insufficient documentation

## 2013-03-16 DIAGNOSIS — IMO0001 Reserved for inherently not codable concepts without codable children: Secondary | ICD-10-CM | POA: Insufficient documentation

## 2013-03-16 DIAGNOSIS — Z87448 Personal history of other diseases of urinary system: Secondary | ICD-10-CM | POA: Insufficient documentation

## 2013-03-16 DIAGNOSIS — R519 Headache, unspecified: Secondary | ICD-10-CM

## 2013-03-16 MED ORDER — METHOCARBAMOL 750 MG PO TABS: 750.0000 mg | ORAL_TABLET | Freq: Four times a day (QID) | ORAL | Status: DC | PRN
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
## 2013-03-16 NOTE — Telephone Encounter (Signed)
Patient seen in the ED.

## 2013-03-16 NOTE — ED Provider Notes (Signed)
Medical screening examination/treatment/procedure(s) were performed by non-physician practitioner and as supervising physician I was immediately available for consultation/collaboration.   Ward Givens, MD 03/16/13 1240

## 2013-03-16 NOTE — Telephone Encounter (Signed)
ED Notification 
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
## 2013-03-16 NOTE — ED Provider Notes (Signed)
Medical screening examination/treatment/procedure(s) were performed by non-physician practitioner and as supervising physician I was immediately available for consultation/collaboration. Devoria Albe, MD, Armando Gang   Ward Givens, MD 03/16/13 [phone]

## 2013-04-07 ENCOUNTER — Ambulatory Visit (INDEPENDENT_AMBULATORY_CARE_PROVIDER_SITE_OTHER): Payer: 59 | Admitting: Family Medicine

## 2013-04-07 ENCOUNTER — Encounter: Payer: Self-pay | Admitting: Family Medicine

## 2013-04-07 ENCOUNTER — Other Ambulatory Visit (INDEPENDENT_AMBULATORY_CARE_PROVIDER_SITE_OTHER): Payer: 59

## 2013-04-07 VITALS — BP 126/82 | HR 83 | Temp 98.3°F | Wt 220.0 lb

## 2013-04-07 DIAGNOSIS — J209 Acute bronchitis, unspecified: Secondary | ICD-10-CM

## 2013-04-07 DIAGNOSIS — Z0000 Encounter for general adult medical examination without abnormal findings: Secondary | ICD-10-CM

## 2013-04-07 LAB — CBC WITH DIFFERENTIAL/PLATELET
Basophils Absolute: 0 10*3/uL (ref 0.0–0.1)
Basophils Relative: 0.3 % (ref 0.0–3.0)
Eosinophils Absolute: 0.2 10*3/uL (ref 0.0–0.7)
Eosinophils Relative: 1.7 % (ref 0.0–5.0)
HCT: 41 % (ref 36.0–46.0)
Hemoglobin: 14 g/dL (ref 12.0–15.0)
Lymphocytes Relative: 26 % (ref 12.0–46.0)
Lymphs Abs: 3 10*3/uL (ref 0.7–4.0)
MCHC: 34.2 g/dL (ref 30.0–36.0)
MCV: 84 fl (ref 78.0–100.0)
Monocytes Absolute: 0.6 10*3/uL (ref 0.1–1.0)
Monocytes Relative: 5.4 % (ref 3.0–12.0)
Neutro Abs: 7.6 10*3/uL (ref 1.4–7.7)
Neutrophils Relative %: 66.6 % (ref 43.0–77.0)
Platelets: 334 10*3/uL (ref 150.0–400.0)
RBC: 4.88 Mil/uL (ref 3.87–5.11)
RDW: 13.8 % (ref 11.5–14.6)
WBC: 11.5 10*3/uL — ABNORMAL HIGH (ref 4.5–10.5)

## 2013-04-07 LAB — BASIC METABOLIC PANEL
BUN: 12 mg/dL (ref 6–23)
CO2: 30 mEq/L (ref 19–32)
Calcium: 9.3 mg/dL (ref 8.4–10.5)
Chloride: 100 mEq/L (ref 96–112)
Creatinine, Ser: 0.8 mg/dL (ref 0.4–1.2)
GFR: 76.38 mL/min (ref >60.00)
Glucose, Bld: 90 mg/dL (ref 70–99)
Potassium: 4.2 mEq/L (ref 3.5–5.1)
Sodium: 139 mEq/L (ref 135–145)

## 2013-04-07 LAB — LIPID PANEL
Cholesterol: 202 mg/dL — ABNORMAL HIGH (ref 0–200)
HDL: 48.4 mg/dL (ref >39.00)
Total CHOL/HDL Ratio: 4
Triglycerides: 152 mg/dL — ABNORMAL HIGH (ref 0.0–149.0)
VLDL: 30.4 mg/dL (ref 0.0–40.0)

## 2013-04-07 LAB — HEPATIC FUNCTION PANEL
ALT: 17 U/L (ref 0–35)
AST: 19 U/L (ref 0–37)
Albumin: 4.2 g/dL (ref 3.5–5.2)
Alkaline Phosphatase: 69 U/L (ref 39–117)
Bilirubin, Direct: 0 mg/dL (ref 0.0–0.3)
Total Bilirubin: 0.6 mg/dL (ref 0.3–1.2)
Total Protein: 7.7 g/dL (ref 6.0–8.3)

## 2013-04-07 LAB — TSH: TSH: 2.65 u[IU]/mL (ref 0.35–5.50)

## 2013-04-07 LAB — LDL CHOLESTEROL, DIRECT: Direct LDL: 133.8 mg/dL

## 2013-04-07 MED ORDER — AZITHROMYCIN 250 MG PO TABS: ORAL_TABLET | ORAL | Status: AC

## 2013-04-07 MED ORDER — HYDROCOD POLST-CHLORPHEN POLST 10-8 MG/5ML PO LQCR: 5.0000 mL | Freq: Two times a day (BID) | ORAL | Status: DC | PRN

## 2013-04-07 NOTE — Progress Notes (Signed)
  Subjective:    Patient ID: Laura Smith, female    DOB: 04/22/1961, 52 y.o.   MRN: [id number]  HPI Patient seen with 2 week history of cough. Early-morning cough is productive and then dry throughout the day. She is a nonsmoker. No chronic lung problems. No history of asthma. She denies any fever, chills, dyspnea, hemoptysis, or any appetite or weight changes. She's tried multiple over-the-counter medications including Delsym, Nyquil, and various cough drops with minimal improvement.  Denies any reactive GERD symptoms. Occasional postnasal drip symptoms. She's had some persistent sinus congestion. Occasional headaches. No facial pain.  Past Medical History  Diagnosis Date  . Death of family member     HUSBAND DIED AT AGE 87 MI  . Migraine   . IC (interstitial cystitis)   . Hemorrhoids   . Hypertension   . Endometriosis     surgery laser 2001  . Hx of basal cell carcinoma    Past Surgical History  Procedure Laterality Date  . Appendectomy  1992    AT C/S  . Cesarean section  1992    APPENDECTOMY SAME TIME  . Pelvic laparoscopy  08/1999    AND HYSTEROSCOPY--ENDOMETRIOSIS  . Hysteroscopy  08/1999    AT TIME OF LAPAROSCOPY    reports that she has never smoked. She does not have any smokeless tobacco history on file. She reports that she does not drink alcohol or use illicit drugs. family history includes Breast cancer in her cousin and mother; Cancer in her paternal grandfather; Cancer (age of onset: 36) in her mother; Heart disease in her father, paternal grandfather, and paternal grandmother; Hypertension in her paternal grandfather and paternal grandmother; Stroke in her maternal grandfather and paternal grandmother. Allergies  Allergen Reactions  . Etodolac Hives    Possible anaphylaxis  With vomiting and throat feeling tight  ED visit 8 14   . Latex Hives, Rash and Other (See Comments)    Causes blisters  . Other Hives, Rash and Other (See Comments)    Gel used for  ultrasound-causes blisters also   . Adhesive [Tape] Itching and Rash      Review of Systems  Constitutional: Negative for fever and chills.  HENT: Positive for sinus pressure.   Respiratory: Positive for cough. Negative for shortness of breath and wheezing.   Neurological: Positive for headaches.       Objective:   Physical Exam  Constitutional: She appears well-developed and well-nourished.  HENT:  Right Ear: External ear normal.  Left Ear: External ear normal.  Mouth/Throat: Oropharynx is clear and moist.  Neck: Neck supple.  Cardiovascular: Normal rate and regular rhythm.   Pulmonary/Chest: Effort normal and breath sounds normal. No respiratory distress. She has no wheezes. She has no rales.  Lymphadenopathy:    She has no cervical adenopathy.          Assessment & Plan:  Cough. Suspect acute viral bronchitis. Nonfocal exam. We have not recommended antibiotic at this time but wrote prescription for Zithromax to start if she has any fever or worsening symptoms. Otherwise treat symptomatically. Tussionex 1 teaspoon each bedtime for severe cough.  She has followup with primary provider next week

## 2013-04-07 NOTE — Patient Instructions (Signed)

## 2013-04-11 ENCOUNTER — Encounter: Payer: Self-pay | Admitting: Internal Medicine

## 2013-04-11 ENCOUNTER — Ambulatory Visit (INDEPENDENT_AMBULATORY_CARE_PROVIDER_SITE_OTHER): Payer: 59 | Admitting: Internal Medicine

## 2013-04-11 VITALS — BP 114/84 | HR 90 | Temp 98.5°F | Ht 67.25 in | Wt 214.0 lb

## 2013-04-11 DIAGNOSIS — G43909 Migraine, unspecified, not intractable, without status migrainosus: Secondary | ICD-10-CM

## 2013-04-11 DIAGNOSIS — J309 Allergic rhinitis, unspecified: Secondary | ICD-10-CM

## 2013-04-11 DIAGNOSIS — J209 Acute bronchitis, unspecified: Secondary | ICD-10-CM

## 2013-04-11 DIAGNOSIS — Z0000 Encounter for general adult medical examination without abnormal findings: Secondary | ICD-10-CM

## 2013-04-11 DIAGNOSIS — Z23 Encounter for immunization: Secondary | ICD-10-CM

## 2013-04-11 DIAGNOSIS — I10 Essential (primary) hypertension: Secondary | ICD-10-CM

## 2013-04-11 DIAGNOSIS — Z87898 Personal history of other specified conditions: Secondary | ICD-10-CM

## 2013-04-11 MED ORDER — FLUTICASONE PROPIONATE 50 MCG/ACT NA SUSP: NASAL | Status: DC

## 2013-04-11 MED ORDER — MECLIZINE HCL 50 MG PO TABS: 50.0000 mg | ORAL_TABLET | Freq: Two times a day (BID) | ORAL | Status: DC | PRN

## 2013-04-11 NOTE — Patient Instructions (Addendum)
This respiratory infection could be aggravated buy allergy also  Add nasal cortisone for the next 2 weeks and then during season. Uncertain if antibiotic will help   Can add if not improved by end of week or getting worse.lungs are clear today. Ok for lfu vaccine . Uncertain about tdap Can come anytime call ahead for shoit only.  BP is good  Wouldn't stop verapamil of hctz at this time. Could also be helping headaches.  Caution with the antivert  Do not drive can cause drowsiness  ROV in 6-12 months   Wellness in 12 months
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
## 2013-04-28 ENCOUNTER — Ambulatory Visit (INDEPENDENT_AMBULATORY_CARE_PROVIDER_SITE_OTHER): Payer: 59 | Admitting: Gynecology

## 2013-04-28 ENCOUNTER — Other Ambulatory Visit (HOSPITAL_COMMUNITY)
Admission: RE | Admit: 2013-04-28 | Discharge: 2013-04-28 | Disposition: A | Discharge status: Home / Self Care | Payer: 59 | Source: Ambulatory Visit | Attending: Gynecology | Admitting: Gynecology

## 2013-04-28 ENCOUNTER — Encounter: Payer: Self-pay | Admitting: Gynecology

## 2013-04-28 VITALS — BP 120/78 | Ht 67.0 in | Wt 213.0 lb

## 2013-04-28 DIAGNOSIS — Z1151 Encounter for screening for human papillomavirus (HPV): Secondary | ICD-10-CM | POA: Insufficient documentation

## 2013-04-28 DIAGNOSIS — Z01419 Encounter for gynecological examination (general) (routine) without abnormal findings: Secondary | ICD-10-CM

## 2013-04-28 DIAGNOSIS — K649 Unspecified hemorrhoids: Secondary | ICD-10-CM

## 2013-04-28 NOTE — Patient Instructions (Signed)
Follow up in one year, sooner as needed. 

## 2013-04-28 NOTE — Addendum Note (Signed)
Addended by: Dayna Barker on: 04/28/2013 12:32 PM   Modules accepted: Orders

## 2013-04-28 NOTE — Progress Notes (Signed)
Laura Smith [birth year] [id number]        52 y.o.  G1P1001 for annual exam.  Doing well.  Past medical history,surgical history, problem list, medications, allergies, family history and social history were all reviewed and documented in the EPIC chart.  ROS:  Performed and pertinent positives and negatives are included in the history, assessment and plan .  Exam: Kim assistant Filed Vitals:   04/28/13 1200  BP: 120/78  Height: 5\' 7"  (1.702 m)  Weight: 213 lb (96.616 kg)   General appearance  Normal Skin grossly normal Head/Neck normal with no cervical or supraclavicular adenopathy thyroid normal Lungs  clear Cardiac RR, without RMG Abdominal  soft, nontender, without masses, organomegaly or hernia Breasts  examined lying and sitting without masses, retractions, discharge or axillary adenopathy. Pelvic  Ext/BUS/vagina  normal  Cervix  normal Pap done  Uterus  anteverted, normal size, shape and contour, midline and mobile nontender   Adnexa  Without masses or tenderness    Anus and perineum  normal   Rectovaginal impressive external hemorrhoids sphincter tone without palpated masses or tenderness.    Assessment/Plan:  52 y.o. G53P1001 female for annual exam.   1. Postmenopausal. Having some hot flushes. Has tried soy with some success. Mother has history of breast cancer she is not interested in HRT. No issues with vaginal dryness or dyspareunia. No bleeding. Will continue to monitor him patient will report any bleeding. 2. External hemorrhoids. Patient has had for years and desires no intervention. 3. Pap smear 2011. Pap/HPV done today. No history of significant abnormal Pap smears previously. 4. Mammography 09/2012. Continue with annual mammography. SBE monthly review. 5. Colonoscopy 2010 with planned repeat next year at five-year interval. 6. DEXA. We'll plan closer to 60. Increase calcium vitamin D reviewed. 7. Health maintenance. Recently had lab work from her primary.  Followup one year, sooner as needed.  Note: This document was prepared with digital dictation and possible smart phrase technology. Any transcriptional errors that result from this process are unintentional.   Dara Lords MD, 12:23 PM 04/28/2013

## 2013-05-23 ENCOUNTER — Telehealth: Payer: Self-pay | Admitting: Internal Medicine

## 2013-05-23 ENCOUNTER — Other Ambulatory Visit: Payer: Self-pay | Admitting: Family Medicine

## 2013-05-23 NOTE — Telephone Encounter (Signed)
Pt needs new rx verapmil 180 mg #30 call into [facility] [address]

## 2013-05-23 NOTE — Telephone Encounter (Signed)
I do not see where you are prescribing this medication.  Please advise.  Thanks!

## 2013-05-24 MED ORDER — VERAPAMIL HCL 180 MG (CO) PO TB24: 180.0000 mg | ORAL_TABLET | Freq: Every day | ORAL | Status: DC

## 2013-05-24 NOTE — Telephone Encounter (Signed)
Sent to the pharmacy by e-scribe. 

## 2013-05-24 NOTE — Telephone Encounter (Signed)
Ok to  Take over this rx  Refill  30  #  Through next October

## 2013-06-22 HISTORY — PX: COLONOSCOPY: SHX174

## 2013-06-27 ENCOUNTER — Encounter: Payer: Self-pay | Admitting: Gastroenterology

## 2013-07-11 ENCOUNTER — Telehealth: Payer: Self-pay | Admitting: *Deleted

## 2013-07-11 MED ORDER — FLUCONAZOLE 150 MG PO TABS: 150.0000 mg | ORAL_TABLET | Freq: Once | ORAL | Status: DC

## 2013-07-11 NOTE — Telephone Encounter (Signed)
Pt c/o yeast infection, itching, white discharge, pt doesn't want OV due high deductible. Pt asked if you would be willing to give Diflucan tablet? Please advise

## 2013-07-11 NOTE — Telephone Encounter (Signed)
Diflucan 150mg x 1 dose

## 2013-07-11 NOTE — Telephone Encounter (Signed)
rx sent, left on voicemail this has been done 

## 2013-09-12 ENCOUNTER — Telehealth: Payer: Self-pay | Admitting: Internal Medicine

## 2013-09-12 NOTE — Telephone Encounter (Signed)
[facility] requesting refill of the following:  verapamil (COVERA HS) 180 MG (CO) 24 hr tablet hydrochlorothiazide 25 MG tablet

## 2013-09-13 NOTE — Telephone Encounter (Signed)
I do not see where you are filling the hctz.  Please advise.  Thanks!

## 2013-09-13 NOTE — Telephone Encounter (Signed)
i will be taking this over ok to  Refill though her next yearly labs CPX

## 2013-09-14 MED ORDER — VERAPAMIL HCL 180 MG (CO) PO TB24: 180.0000 mg | ORAL_TABLET | Freq: Every day | ORAL | Status: DC

## 2013-09-14 MED ORDER — HYDROCHLOROTHIAZIDE 25 MG PO TABS: 25.0000 mg | ORAL_TABLET | Freq: Every day | ORAL | Status: DC

## 2013-09-14 NOTE — Telephone Encounter (Signed)
Sent to the pharmacy by e-scribe. 

## 2013-09-19 ENCOUNTER — Telehealth: Payer: Self-pay | Admitting: Internal Medicine

## 2013-09-19 NOTE — Telephone Encounter (Signed)
[facility] has questions on  The verapamil

## 2013-09-20 MED ORDER — VERAPAMIL HCL ER 180 MG PO TBCR: 180.0000 mg | EXTENDED_RELEASE_TABLET | Freq: Every day | ORAL | Status: DC

## 2013-09-20 NOTE — Telephone Encounter (Signed)
Covera brand has been discontinued.  Sent in a different brand of verapamil.

## 2013-11-21 ENCOUNTER — Ambulatory Visit (INDEPENDENT_AMBULATORY_CARE_PROVIDER_SITE_OTHER)
Admission: RE | Admit: 2013-11-21 | Discharge: 2013-11-21 | Disposition: A | Discharge status: Home / Self Care | Payer: 59 | Source: Ambulatory Visit | Attending: Internal Medicine | Admitting: Internal Medicine

## 2013-11-21 ENCOUNTER — Encounter: Payer: Self-pay | Admitting: Internal Medicine

## 2013-11-21 ENCOUNTER — Ambulatory Visit (INDEPENDENT_AMBULATORY_CARE_PROVIDER_SITE_OTHER): Payer: 59 | Admitting: Internal Medicine

## 2013-11-21 VITALS — BP 110/72 | Temp 98.1°F | Ht 67.25 in | Wt 214.0 lb

## 2013-11-21 DIAGNOSIS — M79672 Pain in left foot: Secondary | ICD-10-CM | POA: Insufficient documentation

## 2013-11-21 DIAGNOSIS — M722 Plantar fascial fibromatosis: Secondary | ICD-10-CM

## 2013-11-21 DIAGNOSIS — M79609 Pain in unspecified limb: Secondary | ICD-10-CM

## 2013-11-21 NOTE — Patient Instructions (Signed)
This problem  Is probably plantar fasciiits   Get  X ray   Ice every night  Stretching exercise   Gel heels  Soft .  If not getting better plan sports medicine referral.  Call for referral.    Plantar Fasciitis (Heel Spur Syndrome) with Rehab The plantar fascia is a fibrous, ligament-like, soft-tissue structure that spans the bottom of the foot. Plantar fasciitis is a condition that causes pain in the foot due to inflammation of the tissue. SYMPTOMS   Pain and tenderness on the underneath side of the foot.  Pain that worsens with standing or walking. CAUSES  Plantar fasciitis is caused by irritation and injury to the plantar fascia on the underneath side of the foot. Common mechanisms of injury include:  Direct trauma to bottom of the foot.  Damage to a small nerve that runs under the foot where the main fascia attaches to the heel bone.  Stress placed on the plantar fascia due to bone spurs. RISK INCREASES WITH:   Activities that place stress on the plantar fascia (running, jumping, pivoting, or cutting).  Poor strength and flexibility.  Improperly fitted shoes.  Tight calf muscles.  Flat feet.  Failure to warm-up properly before activity.  Obesity. PREVENTION  Warm up and stretch properly before activity.  Allow for adequate recovery between workouts.  Maintain physical fitness:  Strength, flexibility, and endurance.  Cardiovascular fitness.  Maintain a health body weight.  Avoid stress on the plantar fascia.  Wear properly fitted shoes, including arch supports for individuals who have flat feet. PROGNOSIS  If treated properly, then the symptoms of plantar fasciitis usually resolve without surgery. However, occasionally surgery is necessary. RELATED COMPLICATIONS   Recurrent symptoms that may result in a chronic condition.  Problems of the lower back that are caused by compensating for the injury, such as limping.  Pain or weakness of the foot during  push-off following surgery.  Chronic inflammation, scarring, and partial or complete fascia tear, occurring more often from repeated injections. TREATMENT  Treatment initially involves the use of ice and medication to help reduce pain and inflammation. The use of strengthening and stretching exercises may help reduce pain with activity, especially stretches of the Achilles tendon. These exercises may be performed at home or with a therapist. Your caregiver may recommend that you use heel cups of arch supports to help reduce stress on the plantar fascia. Occasionally, corticosteroid injections are given to reduce inflammation. If symptoms persist for greater than 6 months despite non-surgical (conservative), then surgery may be recommended.  MEDICATION   If pain medication is necessary, then nonsteroidal anti-inflammatory medications, such as aspirin and ibuprofen, or other minor pain relievers, such as acetaminophen, are often recommended.  Do not take pain medication within 7 days before surgery.  Prescription pain relievers may be given if deemed necessary by your caregiver. Use only as directed and only as much as you need.  Corticosteroid injections may be given by your caregiver. These injections should be reserved for the most serious cases, because they may only be given a certain number of times. HEAT AND COLD  Cold treatment (icing) relieves pain and reduces inflammation. Cold treatment should be applied for 10 to 15 minutes every 2 to 3 hours for inflammation and pain and immediately after any activity that aggravates your symptoms. Use ice packs or massage the area with a piece of ice (ice massage).  Heat treatment may be used prior to performing the stretching and strengthening activities prescribed  by your caregiver, physical therapist, or athletic trainer. Use a heat pack or soak the injury in warm water. SEEK IMMEDIATE MEDICAL CARE IF:  Treatment seems to offer no benefit, or the  condition worsens.  Any medications produce adverse side effects. EXERCISES RANGE OF MOTION (ROM) AND STRETCHING EXERCISES - Plantar Fasciitis (Heel Spur Syndrome) These exercises may help you when beginning to rehabilitate your injury. Your symptoms may resolve with or without further involvement from your physician, physical therapist or athletic trainer. While completing these exercises, remember:   Restoring tissue flexibility helps normal motion to return to the joints. This allows healthier, less painful movement and activity.  An effective stretch should be held for at least 30 seconds.  A stretch should never be painful. You should only feel a gentle lengthening or release in the stretched tissue. RANGE OF MOTION - Toe Extension, Flexion  Sit with your right / left leg crossed over your opposite knee.  Grasp your toes and gently pull them back toward the top of your foot. You should feel a stretch on the bottom of your toes and/or foot.  Hold this stretch for __________ seconds.  Now, gently pull your toes toward the bottom of your foot. You should feel a stretch on the top of your toes and or foot.  Hold this stretch for __________ seconds. Repeat __________ times. Complete this stretch __________ times per day.  RANGE OF MOTION - Ankle Dorsiflexion, Active Assisted  Remove shoes and sit on a chair that is preferably not on a carpeted surface.  Place right / left foot under knee. Extend your opposite leg for support.  Keeping your heel down, slide your right / left foot back toward the chair until you feel a stretch at your ankle or calf. If you do not feel a stretch, slide your bottom forward to the edge of the chair, while still keeping your heel down.  Hold this stretch for __________ seconds. Repeat __________ times. Complete this stretch __________ times per day.  STRETCH  Gastroc, Standing  Place hands on wall.  Extend right / left leg, keeping the front knee  somewhat bent.  Slightly point your toes inward on your back foot.  Keeping your right / left heel on the floor and your knee straight, shift your weight toward the wall, not allowing your back to arch.  You should feel a gentle stretch in the right / left calf. Hold this position for __________ seconds. Repeat __________ times. Complete this stretch __________ times per day. STRETCH  Soleus, Standing  Place hands on wall.  Extend right / left leg, keeping the other knee somewhat bent.  Slightly point your toes inward on your back foot.  Keep your right / left heel on the floor, bend your back knee, and slightly shift your weight over the back leg so that you feel a gentle stretch deep in your back calf.  Hold this position for __________ seconds. Repeat __________ times. Complete this stretch __________ times per day. STRETCH  Gastrocsoleus, Standing  Note: This exercise can place a lot of stress on your foot and ankle. Please complete this exercise only if specifically instructed by your caregiver.   Place the ball of your right / left foot on a step, keeping your other foot firmly on the same step.  Hold on to the wall or a rail for balance.  Slowly lift your other foot, allowing your body weight to press your heel down over the edge of the  step.  You should feel a stretch in your right / left calf.  Hold this position for __________ seconds.  Repeat this exercise with a slight bend in your right / left knee. Repeat __________ times. Complete this stretch __________ times per day.  STRENGTHENING EXERCISES - Plantar Fasciitis (Heel Spur Syndrome)  These exercises may help you when beginning to rehabilitate your injury. They may resolve your symptoms with or without further involvement from your physician, physical therapist or athletic trainer. While completing these exercises, remember:   Muscles can gain both the endurance and the strength needed for everyday activities  through controlled exercises.  Complete these exercises as instructed by your physician, physical therapist or athletic trainer. Progress the resistance and repetitions only as guided. STRENGTH - Towel Curls  Sit in a chair positioned on a non-carpeted surface.  Place your foot on a towel, keeping your heel on the floor.  Pull the towel toward your heel by only curling your toes. Keep your heel on the floor.  If instructed by your physician, physical therapist or athletic trainer, add ____________________ at the end of the towel. Repeat __________ times. Complete this exercise __________ times per day. STRENGTH - Ankle Inversion  Secure one end of a rubber exercise band/tubing to a fixed object (table, pole). Loop the other end around your foot just before your toes.  Place your fists between your knees. This will focus your strengthening at your ankle.  Slowly, pull your big toe up and in, making sure the band/tubing is positioned to resist the entire motion.  Hold this position for __________ seconds.  Have your muscles resist the band/tubing as it slowly pulls your foot back to the starting position. Repeat __________ times. Complete this exercises __________ times per day.  Document Released: 06/08/2005 Document Revised: 08/31/2011 Document Reviewed: 09/20/2008 [facility] Patient Information 2014 [place], [place].

## 2013-11-21 NOTE — Progress Notes (Signed)
Chief Complaint  Patient presents with  . Foot Pain    Left side.  Comes and goes.  Ongoing for 2 months.    HPI: Patient comes in today for SDA for  new problem evaluation  no specific injury but sudden onset of left heel pain about 2 months ago that is waxing and waning. She used her husband's heel cups because he had plantar fasciitis that has improved may have helped some tends to wear sandals at work heals when she goes to church. No specific injury but does have some tenderness on the lateral ankle and the top of the foot at times. History some icing with some mild help  ROS: See pertinent positives and negatives per HPI.  Past Medical History  Diagnosis Date  . Migraine   . IC (interstitial cystitis)   . Hemorrhoids   . Hypertension   . Endometriosis     surgery laser 2001  . Hx of basal cell carcinoma     Family History  Problem Relation Age of Onset  . Cancer Mother 60    COLON  . Breast cancer Mother     Age 5  . Heart disease Father   . Hypertension Paternal Grandmother   . Heart disease Paternal Grandmother   . Stroke Paternal Grandmother   . Hypertension Paternal Grandfather   . Heart disease Paternal Grandfather   . Cancer Paternal Grandfather     Prostate  . Breast cancer Cousin     Age 22's  . Stroke Maternal Grandfather     History   Social History  . Marital Status: Married    Spouse Name: N/A    Number of Children: N/A  . Years of Education: N/A   Social History Main Topics  . Smoking status: Never Smoker   . Smokeless tobacco: None  . Alcohol Use: No  . Drug Use: No  . Sexual Activity: No   Other Topics Concern  . None   Social History Narrative   h h of 3  Pet cat    g1 p1   [employer] [place]     Outpatient Encounter Prescriptions as of 11/21/2013  Medication Sig  . Ascorbic Acid (VITAMIN C) 500 MG CAPS Take 1,000 mg by mouth every morning.   Marland Kitchen b complex vitamins tablet Take 2 tablets by mouth  daily.   Marland Kitchen CALCIUM-MAGNESIUM PO Take 3 tablets by mouth daily.   . cetirizine (ZYRTEC) 10 MG tablet Take 10 mg by mouth daily.  . fluticasone (FLONASE) 50 MCG/ACT nasal spray 2 spray each nostril qd  . hydrochlorothiazide (HYDRODIURIL) 25 MG tablet Take 1 tablet (25 mg total) by mouth daily.  Marland Kitchen HYDROcodone-acetaminophen (NORCO) 7.5-325 MG per tablet Take 1-2 tablets by mouth every 4 (four) hours as needed for pain (as needed for migraine).   Marland Kitchen HYDROmorphone (DILAUDID) 4 MG tablet Take 4-8 mg by mouth every 4 (four) hours as needed for pain (rescure for migraine).   . meclizine (ANTIVERT) 50 MG tablet Take 1 tablet (50 mg total) by mouth 2 (two) times daily as needed (for travel).  . Multiple Vitamins-Iron (MULTIVITAMIN/IRON) TABS Take 1 tablet by mouth daily.   . sulindac (CLINORIL) 200 MG tablet Take 200 mg by mouth 2 (two) times daily as needed (headache).  . SUMAtriptan (IMITREX) 6 MG/0.5ML SOLN injection Inject 6 mg into the skin every 2 (two) hours as needed for migraine. F  . verapamil (CALAN-SR) 180  MG CR tablet Take 1 tablet (180 mg total) by mouth at bedtime.  Marland Kitchen ZOLMitriptan (ZOMIG NA) Place 1 spray into the nose daily as needed (for migraines).   . montelukast (SINGULAIR) 10 MG tablet Take 10 mg by mouth daily as needed (for seasonal allergies, uses as needed).   . [DISCONTINUED] chlorpheniramine-HYDROcodone (TUSSIONEX PENNKINETIC ER) 10-8 MG/5ML LQCR Take 5 mLs by mouth every 12 (twelve) hours as needed.  . [DISCONTINUED] fluconazole (DIFLUCAN) 150 MG tablet Take 1 tablet (150 mg total) by mouth once.    EXAM:  BP 110/72  Temp(Src) 98.1 F (36.7 C) (Oral)  Ht 5' 7.25" (1.708 m)  Wt 214 lb (97.07 kg)  BMI 33.27 kg/m2  Body mass index is 33.27 kg/(m^2).  GENERAL: vitals reviewed and listed above, alert, oriented, appears well hydrated and in no acute distress HEENT: atraumatic, conjunctiva  clear, no obvious abnormalities on inspection of external nose and ears MS: moves all  extremities without noticeable focal  abnormality point tenderness in the left heel area negative squeeze test there some mild swelling at the left lateral ankle anterior talofibular ligament has some asymmetric pronation of the left foot neurovascular intact PSYCH: pleasant and cooperative, no obvious depression or anxiety  ASSESSMENT AND PLAN:  Discussed the following assessment and plan:  Pain of left heel and foot - PS most likely does have slight swelling lateral ankle possibly adapted gait asymmetrical pronation get x-ray eyes exercise heel inserts if not improved plan SM - Plan: DG Foot Complete Left, DG Os Calcis Left  Plantar fasciitis of left foot If patient ends up with sports medicine she wants to go to breakthrough therapy Dr. Linward Foster will call if she needs a referral. Reviewed plan avoid sandals wear better supporting shoes  -Patient advised to return or notify health care team  if symptoms worsen ,persist or new concerns arise.  Patient Instructions  This problem  Is probably plantar fasciiits   Get  X ray   Ice every night  Stretching exercise   Gel heels  Soft .  If not getting better plan sports medicine referral.  Call for referral.    Plantar Fasciitis (Heel Spur Syndrome) with Rehab The plantar fascia is a fibrous, ligament-like, soft-tissue structure that spans the bottom of the foot. Plantar fasciitis is a condition that causes pain in the foot due to inflammation of the tissue. SYMPTOMS   Pain and tenderness on the underneath side of the foot.  Pain that worsens with standing or walking. CAUSES  Plantar fasciitis is caused by irritation and injury to the plantar fascia on the underneath side of the foot. Common mechanisms of injury include:  Direct trauma to bottom of the foot.  Damage to a small nerve that runs under the foot where the main fascia attaches to the heel bone.  Stress placed on the plantar fascia due to bone spurs. RISK INCREASES WITH:    Activities that place stress on the plantar fascia (running, jumping, pivoting, or cutting).  Poor strength and flexibility.  Improperly fitted shoes.  Tight calf muscles.  Flat feet.  Failure to warm-up properly before activity.  Obesity. PREVENTION  Warm up and stretch properly before activity.  Allow for adequate recovery between workouts.  Maintain physical fitness:  Strength, flexibility, and endurance.  Cardiovascular fitness.  Maintain a health body weight.  Avoid stress on the plantar fascia.  Wear properly fitted shoes, including arch supports for individuals who have flat feet. PROGNOSIS  If treated properly, then the  symptoms of plantar fasciitis usually resolve without surgery. However, occasionally surgery is necessary. RELATED COMPLICATIONS   Recurrent symptoms that may result in a chronic condition.  Problems of the lower back that are caused by compensating for the injury, such as limping.  Pain or weakness of the foot during push-off following surgery.  Chronic inflammation, scarring, and partial or complete fascia tear, occurring more often from repeated injections. TREATMENT  Treatment initially involves the use of ice and medication to help reduce pain and inflammation. The use of strengthening and stretching exercises may help reduce pain with activity, especially stretches of the Achilles tendon. These exercises may be performed at home or with a therapist. Your caregiver may recommend that you use heel cups of arch supports to help reduce stress on the plantar fascia. Occasionally, corticosteroid injections are given to reduce inflammation. If symptoms persist for greater than 6 months despite non-surgical (conservative), then surgery may be recommended.  MEDICATION   If pain medication is necessary, then nonsteroidal anti-inflammatory medications, such as aspirin and ibuprofen, or other minor pain relievers, such as acetaminophen, are often  recommended.  Do not take pain medication within 7 days before surgery.  Prescription pain relievers may be given if deemed necessary by your caregiver. Use only as directed and only as much as you need.  Corticosteroid injections may be given by your caregiver. These injections should be reserved for the most serious cases, because they may only be given a certain number of times. HEAT AND COLD  Cold treatment (icing) relieves pain and reduces inflammation. Cold treatment should be applied for 10 to 15 minutes every 2 to 3 hours for inflammation and pain and immediately after any activity that aggravates your symptoms. Use ice packs or massage the area with a piece of ice (ice massage).  Heat treatment may be used prior to performing the stretching and strengthening activities prescribed by your caregiver, physical therapist, or athletic trainer. Use a heat pack or soak the injury in warm water. SEEK IMMEDIATE MEDICAL CARE IF:  Treatment seems to offer no benefit, or the condition worsens.  Any medications produce adverse side effects. EXERCISES RANGE OF MOTION (ROM) AND STRETCHING EXERCISES - Plantar Fasciitis (Heel Spur Syndrome) These exercises may help you when beginning to rehabilitate your injury. Your symptoms may resolve with or without further involvement from your physician, physical therapist or athletic trainer. While completing these exercises, remember:   Restoring tissue flexibility helps normal motion to return to the joints. This allows healthier, less painful movement and activity.  An effective stretch should be held for at least 30 seconds.  A stretch should never be painful. You should only feel a gentle lengthening or release in the stretched tissue. RANGE OF MOTION - Toe Extension, Flexion  Sit with your right / left leg crossed over your opposite knee.  Grasp your toes and gently pull them back toward the top of your foot. You should feel a stretch on the bottom  of your toes and/or foot.  Hold this stretch for __________ seconds.  Now, gently pull your toes toward the bottom of your foot. You should feel a stretch on the top of your toes and or foot.  Hold this stretch for __________ seconds. Repeat __________ times. Complete this stretch __________ times per day.  RANGE OF MOTION - Ankle Dorsiflexion, Active Assisted  Remove shoes and sit on a chair that is preferably not on a carpeted surface.  Place right / left foot under knee. Extend  your opposite leg for support.  Keeping your heel down, slide your right / left foot back toward the chair until you feel a stretch at your ankle or calf. If you do not feel a stretch, slide your bottom forward to the edge of the chair, while still keeping your heel down.  Hold this stretch for __________ seconds. Repeat __________ times. Complete this stretch __________ times per day.  STRETCH  Gastroc, Standing  Place hands on wall.  Extend right / left leg, keeping the front knee somewhat bent.  Slightly point your toes inward on your back foot.  Keeping your right / left heel on the floor and your knee straight, shift your weight toward the wall, not allowing your back to arch.  You should feel a gentle stretch in the right / left calf. Hold this position for __________ seconds. Repeat __________ times. Complete this stretch __________ times per day. STRETCH  Soleus, Standing  Place hands on wall.  Extend right / left leg, keeping the other knee somewhat bent.  Slightly point your toes inward on your back foot.  Keep your right / left heel on the floor, bend your back knee, and slightly shift your weight over the back leg so that you feel a gentle stretch deep in your back calf.  Hold this position for __________ seconds. Repeat __________ times. Complete this stretch __________ times per day. STRETCH  Gastrocsoleus, Standing  Note: This exercise can place a lot of stress on your foot and  ankle. Please complete this exercise only if specifically instructed by your caregiver.   Place the ball of your right / left foot on a step, keeping your other foot firmly on the same step.  Hold on to the wall or a rail for balance.  Slowly lift your other foot, allowing your body weight to press your heel down over the edge of the step.  You should feel a stretch in your right / left calf.  Hold this position for __________ seconds.  Repeat this exercise with a slight bend in your right / left knee. Repeat __________ times. Complete this stretch __________ times per day.  STRENGTHENING EXERCISES - Plantar Fasciitis (Heel Spur Syndrome)  These exercises may help you when beginning to rehabilitate your injury. They may resolve your symptoms with or without further involvement from your physician, physical therapist or athletic trainer. While completing these exercises, remember:   Muscles can gain both the endurance and the strength needed for everyday activities through controlled exercises.  Complete these exercises as instructed by your physician, physical therapist or athletic trainer. Progress the resistance and repetitions only as guided. STRENGTH - Towel Curls  Sit in a chair positioned on a non-carpeted surface.  Place your foot on a towel, keeping your heel on the floor.  Pull the towel toward your heel by only curling your toes. Keep your heel on the floor.  If instructed by your physician, physical therapist or athletic trainer, add ____________________ at the end of the towel. Repeat __________ times. Complete this exercise __________ times per day. STRENGTH - Ankle Inversion  Secure one end of a rubber exercise band/tubing to a fixed object (table, pole). Loop the other end around your foot just before your toes.  Place your fists between your knees. This will focus your strengthening at your ankle.  Slowly, pull your big toe up and in, making sure the band/tubing is  positioned to resist the entire motion.  Hold this position for __________ seconds.  Have your muscles resist the band/tubing as it slowly pulls your foot back to the starting position. Repeat __________ times. Complete this exercises __________ times per day.  Document Released: 06/08/2005 Document Revised: 08/31/2011 Document Reviewed: 09/20/2008 [facility] Patient Information 2014 [place], [place].    [name]. Nivin Braniff M.D.  Pre visit review using our clinic review tool, if applicable. No additional management support is needed unless otherwise documented below in the visit note. Total visit 45mins > 50% spent counseling and coordinating care

## 2013-12-28 ENCOUNTER — Ambulatory Visit (INDEPENDENT_AMBULATORY_CARE_PROVIDER_SITE_OTHER): Payer: 59 | Admitting: Physician Assistant

## 2013-12-28 ENCOUNTER — Encounter: Payer: Self-pay | Admitting: Physician Assistant

## 2013-12-28 VITALS — BP 102/80 | HR 94 | Temp 100.7°F | Resp 18 | Wt 211.0 lb

## 2013-12-28 DIAGNOSIS — J0100 Acute maxillary sinusitis, unspecified: Secondary | ICD-10-CM

## 2013-12-28 MED ORDER — CEFUROXIME AXETIL 500 MG PO TABS: 500.0000 mg | ORAL_TABLET | Freq: Two times a day (BID) | ORAL | Status: DC

## 2013-12-28 NOTE — Progress Notes (Signed)
Subjective:    Patient ID: Laura Smith, female    DOB: 06/19/1961, 53 y.o.   MRN: [id number]  Cough This is a new problem. The current episode started in the past 7 days (6 days). The problem has been gradually worsening. The problem occurs constantly. The cough is productive of purulent sputum. Associated symptoms include chills, a fever, headaches, nasal congestion, postnasal drip and shortness of breath. Pertinent negatives include no chest pain, ear congestion, ear pain, heartburn, hemoptysis, myalgias, rash, rhinorrhea, sore throat, sweats, weight loss or wheezing. Exacerbated by: Says she is allergic to mold, and she was recently exposed to some moldy vents while house sitting. Treatments tried: dayquil, nyquil, salt water gargles, vit c, advil cold and sinus.  The treatment provided no relief. Her past medical history is significant for environmental allergies. There is no history of asthma or COPD.      Review of Systems  Constitutional: Positive for fever and chills. Negative for weight loss.  HENT: Positive for postnasal drip, sinus pressure, trouble swallowing and voice change. Negative for ear pain, rhinorrhea and sore throat.   Respiratory: Positive for cough and shortness of breath. Negative for hemoptysis, choking and wheezing.   Cardiovascular: Negative for chest pain.  Gastrointestinal: Negative for heartburn, nausea, vomiting and diarrhea.  Musculoskeletal: Negative for myalgias.  Skin: Negative for rash.  Allergic/Immunologic: Positive for environmental allergies.  Neurological: Positive for headaches.  All other systems reviewed and are negative.    Past Medical History  Diagnosis Date  . Migraine   . IC (interstitial cystitis)   . Hemorrhoids   . Hypertension   . Endometriosis     surgery laser 2001  . Hx of basal cell carcinoma     History   Social History  . Marital Status: Married    Spouse Name: N/A    Number of Children: N/A  . Years of  Education: N/A   Occupational History  . Not on file.   Social History Main Topics  . Smoking status: Never Smoker   . Smokeless tobacco: Not on file  . Alcohol Use: No  . Drug Use: No  . Sexual Activity: No   Other Topics Concern  . Not on file   Social History Narrative   h h of 3  Pet cat    g1 p1   Works [place]     Past Surgical History  Procedure Laterality Date  . Appendectomy  1992    AT C/S  . Cesarean section  1992    APPENDECTOMY SAME TIME  . Pelvic laparoscopy  08/1999    AND HYSTEROSCOPY--ENDOMETRIOSIS  . Hysteroscopy  08/1999    AT TIME OF LAPAROSCOPY    Family History  Problem Relation Age of Onset  . Cancer Mother 57    COLON  . Breast cancer Mother     Age 12  . Heart disease Father   . Hypertension Paternal Grandmother   . Heart disease Paternal Grandmother   . Stroke Paternal Grandmother   . Hypertension Paternal Grandfather   . Heart disease Paternal Grandfather   . Cancer Paternal Grandfather     Prostate  . Breast cancer Cousin     Age 14's  . Stroke Maternal Grandfather     Allergies  Allergen Reactions  . Etodolac Hives    Possible anaphylaxis  With vomiting and throat feeling tight  ED visit 8 14   . Latex Hives,  Rash and Other (See Comments)    Causes blisters  . Other Hives, Rash and Other (See Comments)    Gel used for ultrasound-causes blisters also   . Adhesive [Tape] Itching and Rash    Current Outpatient Prescriptions on File Prior to Visit  Medication Sig Dispense Refill  . Ascorbic Acid (VITAMIN C) 500 MG CAPS Take 1,000 mg by mouth every morning.       Marland Kitchen b complex vitamins tablet Take 2 tablets by mouth daily.       Marland Kitchen CALCIUM-MAGNESIUM PO Take 3 tablets by mouth daily.       . cetirizine (ZYRTEC) 10 MG tablet Take 10 mg by mouth daily.      . fluticasone (FLONASE) 50 MCG/ACT nasal spray 2 spray each nostril qd  16 g  3  . hydrochlorothiazide (HYDRODIURIL) 25 MG tablet Take 1  tablet (25 mg total) by mouth daily.  90 tablet  1  . HYDROcodone-acetaminophen (NORCO) 7.5-325 MG per tablet Take 1-2 tablets by mouth every 4 (four) hours as needed for pain (as needed for migraine).       Marland Kitchen HYDROmorphone (DILAUDID) 4 MG tablet Take 4-8 mg by mouth every 4 (four) hours as needed for pain (rescure for migraine).       . meclizine (ANTIVERT) 50 MG tablet Take 1 tablet (50 mg total) by mouth 2 (two) times daily as needed (for travel).  30 tablet  0  . montelukast (SINGULAIR) 10 MG tablet Take 10 mg by mouth daily as needed (for seasonal allergies, uses as needed).       . Multiple Vitamins-Iron (MULTIVITAMIN/IRON) TABS Take 1 tablet by mouth daily.       . sulindac (CLINORIL) 200 MG tablet Take 200 mg by mouth 2 (two) times daily as needed (headache).      . SUMAtriptan (IMITREX) 6 MG/0.5ML SOLN injection Inject 6 mg into the skin every 2 (two) hours as needed for migraine. F      . verapamil (CALAN-SR) 180 MG CR tablet Take 1 tablet (180 mg total) by mouth at bedtime.  90 tablet  1  . ZOLMitriptan (ZOMIG NA) Place 1 spray into the nose daily as needed (for migraines).        No current facility-administered medications on file prior to visit.    EXAM: BP 102/80  Pulse 94  Temp(Src) 100.7 F (38.2 C) (Oral)  Resp 18  Wt 211 lb (95.709 kg)  SpO2 97%     Objective:   Physical Exam  Nursing note and vitals reviewed. Constitutional: She is oriented to person, place, and time. She appears well-developed and well-nourished. No distress.  HENT:  Head: Normocephalic and atraumatic.  Right Ear: External ear normal.  Left Ear: External ear normal.  Nose: Nose normal.  Mouth/Throat: No oropharyngeal exudate.  Oropharynx is slightly erythematous, no exudate. Bilateral TMs normal. Bilateral frontal sinus non-TTP. Right Max sinus TTP. Left Max sinus non-TTP.   Eyes: Conjunctivae and EOM are normal. Pupils are equal, round, and reactive to light.  Neck: Normal range of  motion. Neck supple. No JVD present.  Cardiovascular: Normal rate, regular rhythm and intact distal pulses.   Pulmonary/Chest: Effort normal and breath sounds normal. No stridor. No respiratory distress. She exhibits no tenderness.  Musculoskeletal: Normal range of motion.  Lymphadenopathy:    She has no cervical adenopathy.  Neurological: She is alert and oriented to person, place, and time.  Skin: Skin is warm and dry. No rash noted. She  is not diaphoretic. No erythema. No pallor.  Psychiatric: She has a normal mood and affect. Her behavior is normal. Judgment and thought content normal.    Lab Results  Component Value Date   WBC 11.5* 04/07/2013   HGB 14.0 04/07/2013   HCT 41.0 04/07/2013   PLT 334.0 04/07/2013   GLUCOSE 90 04/07/2013   CHOL 202* 04/07/2013   TRIG 152.0* 04/07/2013   HDL 48.40 04/07/2013   LDLDIRECT 133.8 04/07/2013   ALT 17 04/07/2013   AST 19 04/07/2013   NA 139 04/07/2013   K 4.2 04/07/2013   CL 100 04/07/2013   CREATININE 0.8 04/07/2013   BUN 12 04/07/2013   CO2 30 04/07/2013   TSH 2.65 04/07/2013        Assessment & Plan:  Chirstine was seen today for cough.  Diagnoses and associated orders for this visit:  Acute maxillary sinusitis, recurrence not specified Comments: Fever, unilateral sinus pain. Will treat with OTCs and rx ceftin. - cefUROXime (CEFTIN) 500 MG tablet; Take 1 tablet (500 mg total) by mouth 2 (two) times daily.    Pt will add OTC mucinex, nasal steroid spray due to allergic concerns, push fluid hydration.  Return precautions provided, and patient handout on sinusitis.  Plan to follow up as needed, or for worsening or persistent symptoms despite treatment.  Patient Instructions  Ceftin twice daily for 10 days for sinus infection.  Plain Over the Counter Mucinex (NOT Mucinex D) for thick secretions  Force NON dairy fluids, drinking plenty of water is best.    Over the Counter Flonase OR Nasacort AQ 1 spray in each nostril  twice a day as needed. Use the "crossover" technique into opposite nostril spraying toward opposite ear @ 45 degree angle, not straight up into nostril.   Plain Over the Counter Allegra (NOT D )  160 daily , OR Loratidine 10 mg , OR Zyrtec 10 mg @ bedtime  as needed for itchy eyes & sneezing.  Saline Irrigation and Saline Sprays can also help reduce symptoms.  If emergency symptoms discussed during visit developed, seek medical attention immediately.  Followup as needed, or for worsening or persistent symptoms despite treatment.

## 2013-12-28 NOTE — Progress Notes (Signed)
Pre visit review using our clinic review tool, if applicable. No additional management support is needed unless otherwise documented below in the visit note. 

## 2013-12-28 NOTE — Patient Instructions (Signed)
Ceftin twice daily for 10 days for sinus infection.  Plain Over the Counter Mucinex (NOT Mucinex D) for thick secretions  Force NON dairy fluids, drinking plenty of water is best.    Over the Counter Flonase OR Nasacort AQ 1 spray in each nostril twice a day as needed. Use the "crossover" technique into opposite nostril spraying toward opposite ear @ 45 degree angle, not straight up into nostril.   Plain Over the Counter Allegra (NOT D )  160 daily , OR Loratidine 10 mg , OR Zyrtec 10 mg @ bedtime  as needed for itchy eyes & sneezing.  Saline Irrigation and Saline Sprays can also help reduce symptoms.  If emergency symptoms discussed during visit developed, seek medical attention immediately.  Followup as needed, or for worsening or persistent symptoms despite treatment.    Sinusitis Sinusitis is redness, soreness, and puffiness (inflammation) of the air pockets in the bones of your face (sinuses). The redness, soreness, and puffiness can cause air and mucus to get trapped in your sinuses. This can allow germs to grow and cause an infection.  HOME CARE   Drink enough fluids to keep your pee (urine) clear or pale yellow.  Use a humidifier in your home.  Run a hot shower to create steam in the bathroom. Sit in the bathroom with the door closed. Breathe in the steam 3-4 times a day.  Put a warm, moist washcloth on your face 3-4 times a day, or as told by your doctor.  Use salt water sprays (saline sprays) to wet the thick fluid in your nose. This can help the sinuses drain.  Only take medicine as told by your doctor. GET HELP RIGHT AWAY IF:   Your pain gets worse.  You have very bad headaches.  You are sick to your stomach (nauseous).  You throw up (vomit).  You are very sleepy (drowsy) all the time.  Your face is puffy (swollen).  Your vision changes.  You have a stiff neck.  You have trouble breathing. MAKE SURE YOU:   Understand these instructions.  Will watch  your condition.  Will get help right away if you are not doing well or get worse. Document Released: 11/25/2007 Document Revised: 03/02/2012 Document Reviewed: 01/12/2012 [facility] Patient Information 2015 [place], [place]. This information is not intended to replace advice given to you by your health care provider. Make sure you discuss any questions you have with your health care provider.

## 2013-12-29 ENCOUNTER — Telehealth: Payer: Self-pay | Admitting: Internal Medicine

## 2013-12-29 MED ORDER — HYDROCOD POLST-CHLORPHEN POLST 10-8 MG/5ML PO LQCR: 5.0000 mL | Freq: Two times a day (BID) | ORAL | Status: DC | PRN

## 2013-12-29 NOTE — Telephone Encounter (Signed)
Pt states she was under the impression Laura Smith was going to call in rx for cough medication at her ov yesterday.  However, it was not called in.  She is requesting rx for cough medicine, states she has taken Tuccinex in the past and it works well for her.

## 2013-12-29 NOTE — Telephone Encounter (Signed)
Rx printed and ready for pick up.

## 2013-12-29 NOTE — Telephone Encounter (Signed)
Cannot be called in, Ok to [facility], pt will have to come pick this up.

## 2014-01-02 ENCOUNTER — Ambulatory Visit (INDEPENDENT_AMBULATORY_CARE_PROVIDER_SITE_OTHER): Payer: 59 | Admitting: Internal Medicine

## 2014-01-02 ENCOUNTER — Encounter: Payer: Self-pay | Admitting: Internal Medicine

## 2014-01-02 VITALS — BP 120/74 | HR 94 | Temp 99.1°F | Ht 67.25 in | Wt 215.0 lb

## 2014-01-02 DIAGNOSIS — R05 Cough: Secondary | ICD-10-CM

## 2014-01-02 DIAGNOSIS — J0100 Acute maxillary sinusitis, unspecified: Secondary | ICD-10-CM

## 2014-01-02 DIAGNOSIS — R059 Cough, unspecified: Secondary | ICD-10-CM

## 2014-01-02 MED ORDER — LEVOFLOXACIN 750 MG PO TABS: 750.0000 mg | ORAL_TABLET | Freq: Every day | ORAL | Status: DC

## 2014-01-02 NOTE — Patient Instructions (Addendum)
It appears that antibiotic is helping the sinus  Infection  But  If not continuing to improve in the next 48 hours would change antibiotic to levaquin.  A broader spectrum  .medication.  Stop the cough drops and use sugar free candy or lemon drops.

## 2014-01-02 NOTE — Progress Notes (Signed)
Pre visit review using our clinic review tool, if applicable. No additional management support is needed unless otherwise documented below in the visit note.  Chief Complaint  Patient presents with  . Sinusitis    Follow up of sinusitis.  Pt continues to have cough, fever, post nasal drip and a sore throat.  Pt is leaving to go to [place].    HPI: Patient comes in today for SDA for ongoing problem evaluation. Seen 7/ 9 by other provider Laura Smith with fever unilateral facila pain felt sinusitis  And saline irrigation  Feels a "midgen better"   But leaving town.  Fever was 99 this am . And throat hurt. On the right  No sob chest hurts from coughing taking cough med sparingly. Using birds B's cough drops a lot has menthol and eucalyptus and sugar. ROS: See pertinent positives and negatives per HPI. No chills  Right face pain a good bit better . No shortness of breath still has a spasmodic cough.  Past Medical History  Diagnosis Date  . Migraine   . IC (interstitial cystitis)   . Hemorrhoids   . Hypertension   . Endometriosis     surgery laser 2001  . Hx of basal cell carcinoma     Family History  Problem Relation Age of Onset  . Cancer Mother 5    COLON  . Breast cancer Mother     Age 51  . Heart disease Father   . Hypertension Paternal Grandmother   . Heart disease Paternal Grandmother   . Stroke Paternal Grandmother   . Hypertension Paternal Grandfather   . Heart disease Paternal Grandfather   . Cancer Paternal Grandfather     Prostate  . Breast cancer Cousin     Age 59's  . Stroke Maternal Grandfather     History   Social History  . Marital Status: Married    Spouse Name: N/A    Number of Children: N/A  . Years of Education: N/A   Social History Main Topics  . Smoking status: Never Smoker   . Smokeless tobacco: None  . Alcohol Use: No  . Drug Use: No  . Sexual Activity: No   Other Topics Concern  . None   Social History Narrative   h h of 3   Pet cat    g1 p1   Works [employer] [place]     Outpatient Encounter Prescriptions as of 01/02/2014  Medication Sig  . Ascorbic Acid (VITAMIN C) 500 MG CAPS Take 1,000 mg by mouth every morning.   Marland Kitchen b complex vitamins tablet Take 2 tablets by mouth daily.   Marland Kitchen CALCIUM-MAGNESIUM PO Take 3 tablets by mouth daily.   . cefUROXime (CEFTIN) 500 MG tablet Take 1 tablet (500 mg total) by mouth 2 (two) times daily.  . cetirizine (ZYRTEC) 10 MG tablet Take 10 mg by mouth daily.  . chlorpheniramine-HYDROcodone (TUSSIONEX PENNKINETIC ER) 10-8 MG/5ML LQCR Take 5 mLs by mouth every 12 (twelve) hours as needed.  . fluticasone (FLONASE) 50 MCG/ACT nasal spray 2 spray each nostril qd  . hydrochlorothiazide (HYDRODIURIL) 25 MG tablet Take 1 tablet (25 mg total) by mouth daily.  Marland Kitchen HYDROcodone-acetaminophen (NORCO) 7.5-325 MG per tablet Take 1-2 tablets by mouth every 4 (four) hours as needed for pain (as needed for migraine).   Marland Kitchen HYDROmorphone (DILAUDID) 4 MG tablet Take 4-8 mg by mouth every 4 (four) hours as needed for pain (rescure for migraine).   Marland Kitchen  meclizine (ANTIVERT) 50 MG tablet Take 1 tablet (50 mg total) by mouth 2 (two) times daily as needed (for travel).  . montelukast (SINGULAIR) 10 MG tablet Take 10 mg by mouth daily as needed (for seasonal allergies, uses as needed).   . Multiple Vitamins-Iron (MULTIVITAMIN/IRON) TABS Take 1 tablet by mouth daily.   . sulindac (CLINORIL) 200 MG tablet Take 200 mg by mouth 2 (two) times daily as needed (headache).  . SUMAtriptan (IMITREX) 6 MG/0.5ML SOLN injection Inject 6 mg into the skin every 2 (two) hours as needed for migraine. F  . verapamil (CALAN-SR) 180 MG CR tablet Take 1 tablet (180 mg total) by mouth at bedtime.  Marland Kitchen ZOLMitriptan (ZOMIG NA) Place 1 spray into the nose daily as needed (for migraines).   Marland Kitchen levofloxacin (LEVAQUIN) 750 MG tablet Take 1 tablet (750 mg total) by mouth daily.    EXAM:  BP 120/74  Pulse 94   Temp(Src) 99.1 F (37.3 C) (Oral)  Ht 5' 7.25" (1.708 m)  Wt 215 lb (97.523 kg)  BMI 33.43 kg/m2  SpO2 96%  Body mass index is 33.43 kg/(m^2). WDWN in NAD  quiet respirations; mildly congested  somewhat hoarse. Non toxic . Dry hacking cough  Non toxic  HEENT: Normocephalic ;atraumatic , Eyes;  PERRL, EOMs  Full, lids and conjunctiva clear,,Ears: no deformities, canals nl, TM landmarks normal, Nose: no deformity or discharge but congested;face minimally tender Mouth : OP  2 + redness drainage tracts  Right more than left   Neck: Supple without adenopathy or masses or bruits Chest:  Clear to A&P without wheezes rales or rhonchi CV:  S1-S2 no gallops or murmurs peripheral perfusion is normal Skin :nl perfusion and no acute rashes  PSYCH: pleasant and cooperative, no obvious depression or anxiety  ASSESSMENT AND PLAN:  Discussed the following assessment and plan:  Acute maxillary sinusitis, recurrence not specified - seems improved from prev note buyt still severe. consider broader spectrum  printed out . no cough drops   Cough Stop the cough drops on the cups of whether to go to 2-3rd line antibiotic   Going out of town...  Expectant management. Chest exam is reassuring.  -Patient advised to return or notify health care team  if symptoms worsen ,persist or new concerns arise.  Patient Instructions  It appears that antibiotic is helping the sinus  Infection  But  If not continuing to improve in the next 48 hours would change antibiotic to levaquin.  A broader spectrum  .medication.  Stop the cough drops and use sugar free candy or lemon drops.    [name]. Panosh M.D.

## 2014-01-04 ENCOUNTER — Encounter: Payer: Self-pay | Admitting: Gastroenterology

## 2014-01-15 ENCOUNTER — Encounter: Payer: Self-pay | Admitting: Gastroenterology

## 2014-02-16 ENCOUNTER — Telehealth: Payer: Self-pay | Admitting: Internal Medicine

## 2014-02-16 NOTE — Telephone Encounter (Signed)
Ms Hebel would like to know if you would except her Mom as a new patient. She is currently a patient of  [facility]; [employer] [facility]

## 2014-03-02 NOTE — Telephone Encounter (Signed)
Talk to pt and told her of Dr Regis Bill decision.

## 2014-03-02 NOTE — Telephone Encounter (Signed)
Not currently taking new medicare patients   .

## 2014-03-09 ENCOUNTER — Other Ambulatory Visit: Payer: Self-pay | Admitting: Internal Medicine

## 2014-03-09 NOTE — Telephone Encounter (Signed)
Sent to the pharmacy by e-scribe.  Pt has upcoming CPE on 04/13/14

## 2014-03-23 ENCOUNTER — Encounter: Payer: 59 | Admitting: Gastroenterology

## 2014-04-06 ENCOUNTER — Other Ambulatory Visit: Payer: 59

## 2014-04-06 ENCOUNTER — Other Ambulatory Visit (INDEPENDENT_AMBULATORY_CARE_PROVIDER_SITE_OTHER): Payer: 59

## 2014-04-06 DIAGNOSIS — Z0000 Encounter for general adult medical examination without abnormal findings: Secondary | ICD-10-CM

## 2014-04-06 LAB — LIPID PANEL
Cholesterol: 192 mg/dL (ref 0–200)
HDL: 48.6 mg/dL (ref >39.00)
LDL Cholesterol: 116 mg/dL — ABNORMAL HIGH (ref 0–99)
NonHDL: 143.4
Total CHOL/HDL Ratio: 4
Triglycerides: 136 mg/dL (ref 0.0–149.0)
VLDL: 27.2 mg/dL (ref 0.0–40.0)

## 2014-04-06 LAB — CBC WITH DIFFERENTIAL/PLATELET
Basophils Absolute: 0 10*3/uL (ref 0.0–0.1)
Basophils Relative: 0.5 % (ref 0.0–3.0)
Eosinophils Absolute: 0.1 10*3/uL (ref 0.0–0.7)
Eosinophils Relative: 2 % (ref 0.0–5.0)
HCT: 43.4 % (ref 36.0–46.0)
Hemoglobin: 14.4 g/dL (ref 12.0–15.0)
Lymphocytes Relative: 32.7 % (ref 12.0–46.0)
Lymphs Abs: 2.5 10*3/uL (ref 0.7–4.0)
MCHC: 33.1 g/dL (ref 30.0–36.0)
MCV: 85.4 fl (ref 78.0–100.0)
Monocytes Absolute: 0.4 10*3/uL (ref 0.1–1.0)
Monocytes Relative: 5.8 % (ref 3.0–12.0)
Neutro Abs: 4.4 10*3/uL (ref 1.4–7.7)
Neutrophils Relative %: 59 % (ref 43.0–77.0)
Platelets: 326 10*3/uL (ref 150.0–400.0)
RBC: 5.08 Mil/uL (ref 3.87–5.11)
RDW: 13.9 % (ref 11.5–15.5)
WBC: 7.5 10*3/uL (ref 4.0–10.5)

## 2014-04-06 LAB — BASIC METABOLIC PANEL
BUN: 17 mg/dL (ref 6–23)
CO2: 28 mEq/L (ref 19–32)
Calcium: 9.4 mg/dL (ref 8.4–10.5)
Chloride: 101 mEq/L (ref 96–112)
Creatinine, Ser: 1 mg/dL (ref 0.4–1.2)
GFR: 59.98 mL/min — ABNORMAL LOW (ref >60.00)
Glucose, Bld: 103 mg/dL — ABNORMAL HIGH (ref 70–99)
Potassium: 4.1 mEq/L (ref 3.5–5.1)
Sodium: 138 mEq/L (ref 135–145)

## 2014-04-06 LAB — HEPATIC FUNCTION PANEL
ALT: 18 U/L (ref 0–35)
AST: 21 U/L (ref 0–37)
Albumin: 3.8 g/dL (ref 3.5–5.2)
Alkaline Phosphatase: 63 U/L (ref 39–117)
Bilirubin, Direct: 0 mg/dL (ref 0.0–0.3)
Total Bilirubin: 0.6 mg/dL (ref 0.2–1.2)
Total Protein: 7.8 g/dL (ref 6.0–8.3)

## 2014-04-06 LAB — TSH: TSH: 2.8 u[IU]/mL (ref 0.35–4.50)

## 2014-04-13 ENCOUNTER — Ambulatory Visit (INDEPENDENT_AMBULATORY_CARE_PROVIDER_SITE_OTHER): Payer: 59 | Admitting: Internal Medicine

## 2014-04-13 ENCOUNTER — Encounter: Payer: Self-pay | Admitting: Internal Medicine

## 2014-04-13 ENCOUNTER — Ambulatory Visit (AMBULATORY_SURGERY_CENTER): Payer: Self-pay | Admitting: *Deleted

## 2014-04-13 VITALS — BP 130/90 | Temp 98.7°F | Ht 67.75 in | Wt 215.0 lb

## 2014-04-13 VITALS — Ht 67.75 in | Wt 214.6 lb

## 2014-04-13 DIAGNOSIS — J302 Other seasonal allergic rhinitis: Secondary | ICD-10-CM

## 2014-04-13 DIAGNOSIS — Z0000 Encounter for general adult medical examination without abnormal findings: Secondary | ICD-10-CM

## 2014-04-13 DIAGNOSIS — R7301 Impaired fasting glucose: Secondary | ICD-10-CM

## 2014-04-13 DIAGNOSIS — Z23 Encounter for immunization: Secondary | ICD-10-CM

## 2014-04-13 DIAGNOSIS — I10 Essential (primary) hypertension: Secondary | ICD-10-CM

## 2014-04-13 DIAGNOSIS — Z800 Family history of malignant neoplasm of digestive organs: Secondary | ICD-10-CM

## 2014-04-13 DIAGNOSIS — L989 Disorder of the skin and subcutaneous tissue, unspecified: Secondary | ICD-10-CM

## 2014-04-13 MED ORDER — HYDROCHLOROTHIAZIDE 25 MG PO TABS: ORAL_TABLET | ORAL | Status: DC

## 2014-04-13 MED ORDER — MONTELUKAST SODIUM 10 MG PO TABS: 10.0000 mg | ORAL_TABLET | Freq: Every day | ORAL | Status: DC | PRN

## 2014-04-13 MED ORDER — VERAPAMIL HCL ER 180 MG PO TBCR: EXTENDED_RELEASE_TABLET | ORAL | Status: DC

## 2014-04-13 MED ORDER — MOVIPREP 100 G PO SOLR: ORAL | Status: DC

## 2014-04-13 NOTE — Patient Instructions (Addendum)
  Blood sugar borderline up .will follow . Avoid sweets and single carbohydrates processed foods. Stay physically active . Let dr Ronnald Ramp look at skin lesion could be keratosis  .   Check bp periodically to make sure below 140/90   Healthy lifestyle includes : At least 150 minutes of exercise weeks  , weight at healthy levels, which is usually   BMI 19-25. Avoid trans fats and processed foods;  Increase fresh fruits and veges to 5 servings per day. And avoid sweet beverages including tea and juice. Mediterranean diet with olive oil and nuts have been noted to be heart and brain healthy . Avoid tobacco products . Limit  alcohol to  7 per week for women and 14 servings for men.  Get adequate sleep . Wear seat belts . Don't text and drive .

## 2014-04-13 NOTE — Progress Notes (Signed)
No allergies to eggs or soy. No problems with anesthesia.  Pt given Emmi instructions for colonoscopy  No oxygen use  No diet drug use  

## 2014-04-13 NOTE — Progress Notes (Signed)
Chief Complaint  Patient presents with  . Annual Exam    meds  . Hypertension    HPI: Patient comes in today for Preventive Health Care visit  No major changes in health  bp doing ok .  No se of med Takes singulair seasonally  Sees gyne on q 3 y pap. Getting colon soon.  Has some stress related to daughter recently married. Medicine reviewed for headaches GYN. Has a dermatologist. Check area on left trunk. Had recent antibiotic for an injection.  Health Maintenance  Topic Date Due  . Mammogram  09/21/2014  . Influenza Vaccine  01/21/2015  . Tetanus/tdap  10/21/2015  . Pap Smear  04/28/2016  . Colonoscopy  08/17/2018   Health Maintenance Review LIFESTYLE:  Exercise:  NOT MUCH  Tobacco/ETS: NO Alcohol: none  Sugar beverages:  Soda on weekend.  Sleep:  Different  Depending on work shift  Drug use: no  Colonoscopy: getting soon   ROS:  GEN/ HEENT: No fever, significant weight changes sweats headaches  per Dr. Catalina Gravel vision problems hearing changes, CV/ PULM; No chest pain shortness of breath cough, syncope,edema  change in exercise tolerance. GI /GU: No adominal pain, vomiting, change in bowel habits. No blood in the stool. No significant GU symptoms. SKIN/HEME: ,no acute skin rashes  or bleeding. No lymphadenopathy, nodules, masses.  NEURO/ PSYCH:  No neurologic signs such as weakness numbness. No depression anxiety. IMM/ Allergy: No unusual infections.  Allergy .   REST of 12 system review negative except as per HPI   Past Medical History  Diagnosis Date  . Migraine   . IC (interstitial cystitis)   . Hemorrhoids   . Hypertension   . Endometriosis     surgery laser 2001  . Hx of basal cell carcinoma   . Cancer   . Allergy     Family History  Problem Relation Age of Onset  . Cancer Mother 38    COLON  . Breast cancer Mother     Age 43  . Colon cancer Mother 10  . Heart disease Father   . Hypertension Paternal Grandmother   . Heart disease Paternal  Grandmother   . Stroke Paternal Grandmother   . Hypertension Paternal Grandfather   . Heart disease Paternal Grandfather   . Cancer Paternal Grandfather     Prostate  . Breast cancer Cousin     Age 43's  . Stroke Maternal Grandfather     History   Social History  . Marital Status: Married    Spouse Name: N/A    Number of Children: N/A  . Years of Education: N/A   Social History Main Topics  . Smoking status: Never Smoker   . Smokeless tobacco: Never Used  . Alcohol Use: No  . Drug Use: No  . Sexual Activity: No   Other Topics Concern  . None   Social History Narrative   h h of 2 Pet cat   Daughter /  Married    g1 p1   Works [employer]    [place]     Outpatient Encounter Prescriptions as of 04/13/2014  Medication Sig  . Ascorbic Acid (VITAMIN C) 500 MG CAPS Take 1,000 mg by mouth every morning.   Marland Kitchen b complex vitamins tablet Take 2 tablets by mouth daily.   Marland Kitchen CALCIUM-MAGNESIUM PO Take 3 tablets by mouth daily.   . cetirizine (ZYRTEC) 10 MG tablet Take 10 mg by mouth daily.  . hydrochlorothiazide (  HYDRODIURIL) 25 MG tablet TAKE 1 TABLET DAILY  . HYDROcodone-acetaminophen (NORCO) 7.5-325 MG per tablet Take 1-2 tablets by mouth every 4 (four) hours as needed for pain (as needed for migraine).   Marland Kitchen HYDROmorphone (DILAUDID) 4 MG tablet Take 4-8 mg by mouth every 4 (four) hours as needed for pain (rescure for migraine).   . meclizine (ANTIVERT) 50 MG tablet Take 1 tablet (50 mg total) by mouth 2 (two) times daily as needed (for travel).  . montelukast (SINGULAIR) 10 MG tablet Take 1 tablet (10 mg total) by mouth daily as needed (for seasonal allergies, uses as needed).  . Multiple Vitamins-Iron (MULTIVITAMIN/IRON) TABS Take 1 tablet by mouth daily.   . sulindac (CLINORIL) 200 MG tablet Take 200 mg by mouth 2 (two) times daily as needed (headache).  . SUMAtriptan (IMITREX) 6 MG/0.5ML SOLN injection Inject 6 mg into the skin every 2 (two) hours as needed  for migraine. F  . verapamil (CALAN-SR) 180 MG CR tablet TAKE 1 TABLET AT BEDTIME  . ZOLMitriptan (ZOMIG NA) Place 1 spray into the nose daily as needed (for migraines).   . [DISCONTINUED] cefUROXime (CEFTIN) 500 MG tablet Take 1 tablet (500 mg total) by mouth 2 (two) times daily.  . [DISCONTINUED] hydrochlorothiazide (HYDRODIURIL) 25 MG tablet TAKE 1 TABLET DAILY  . [DISCONTINUED] montelukast (SINGULAIR) 10 MG tablet Take 10 mg by mouth daily as needed (for seasonal allergies, uses as needed).   . [DISCONTINUED] verapamil (CALAN-SR) 180 MG CR tablet TAKE 1 TABLET AT BEDTIME  . [DISCONTINUED] chlorpheniramine-HYDROcodone (TUSSIONEX PENNKINETIC ER) 10-8 MG/5ML LQCR Take 5 mLs by mouth every 12 (twelve) hours as needed.  . [DISCONTINUED] fluticasone (FLONASE) 50 MCG/ACT nasal spray 2 spray each nostril qd  . [DISCONTINUED] levofloxacin (LEVAQUIN) 750 MG tablet Take 1 tablet (750 mg total) by mouth daily.    EXAM:  BP 130/90  Temp(Src) 98.7 F (37.1 C) (Oral)  Ht 5' 7.75" (1.721 m)  Wt 215 lb (97.523 kg)  BMI 32.93 kg/m2  Body mass index is 32.93 kg/(m^2).  Physical Exam: Vital signs reviewed QTM:AUQJ is a well-developed well-nourished alert cooperative    who appearsr stated age in no acute distress.  HEENT: normocephalic atraumatic , Eyes: PERRL EOM's full, conjunctiva clear, Nares: paten,t no deformity discharge or tenderness., Ears: no deformity EAC's clear TMs with normal landmarks. Mouth: clear OP, no lesions, edema.  Moist mucous membranes. Dentition in adequate repair. NECK: supple without masses, thyromegaly or bruits. CHEST/PULM:  Clear to auscultation and percussion breath sounds equal no wheeze , rales or rhonchi. No chest wall deformities or tenderness. Breast: per gyneCV: PMI is nondisplaced, S1 S2 no gallops, murmurs, rubs. Peripheral pulses are full without delay.No JVD .  ABDOMEN: Bowel sounds normal nontender  No guard or rebound, no hepato splenomegal no CVA tenderness.   No hernia. Extremtities:  No clubbing cyanosis or edema, no acute joint swelling or redness no focal atrophy NEURO:  Oriented x3, cranial nerves 3-12 appear to be intact, no obvious focal weakness,gait within normal limits no abnormal reflexes or asymmetrical SKIN: No acute rashes normal turgor, color, no bruising or petechiae. Left flank 3 mm round warty lesion no irregulary PSYCH: Oriented, good eye contact, no obvious depression anxiety, cognition and judgment appear normal. LN: no cervical axillary inguinal adenopathy  Lab Results  Component Value Date   WBC 7.5 04/06/2014   HGB 14.4 04/06/2014   HCT 43.4 04/06/2014   PLT 326.0 04/06/2014   GLUCOSE 103* 04/06/2014   CHOL 192 04/06/2014  TRIG 136.0 04/06/2014   HDL 48.60 04/06/2014   LDLDIRECT 133.8 04/07/2013   LDLCALC 116* 04/06/2014   ALT 18 04/06/2014   AST 21 04/06/2014   NA 138 04/06/2014   K 4.1 04/06/2014   CL 101 04/06/2014   CREATININE 1.0 04/06/2014   BUN 17 04/06/2014   CO2 28 04/06/2014   TSH 2.80 04/06/2014   BP Readings from Last 3 Encounters:  04/13/14 130/90  01/02/14 120/74  12/28/13 102/80   Wt Readings from Last 3 Encounters:  04/13/14 214 lb 9.6 oz (97.342 kg)  04/13/14 215 lb (97.523 kg)  01/02/14 215 lb (97.523 kg)   Reviewed labs and data. With patient.  ASSESSMENT AND PLAN:  Discussed the following assessment and plan:  Visit for preventive health examination  Essential hypertension - controlled  refill med  Fasting hyperglycemia - borderline .lsi follow .   Seasonal allergies  Flu vaccine need - Plan: Flu Vaccine QUAD 36+ mos PF IM (Fluarix Quad PF) Reviewed above findings lifestyle intervention and followup in regard to hyperglycemia. The discuss stress reduction. Add exercise if she can has an erratic schedule. Patient Care Team: Burnis Medin, MD as PCP - General (Internal Medicine) Anastasio Auerbach, MD as Attending Physician (Obstetrics and Gynecology) Edison Nasuti, MD  (Neurology) Jarome Matin, MD as Consulting Physician (Dermatology) Thalia Bloodgood, OD as Referring Physician [facility]) Patient Instructions   Blood sugar borderline up .will follow . Avoid sweets and single carbohydrates processed foods. Stay physically active . Let dr Ronnald Ramp look at skin lesion could be keratosis  .   Check bp periodically to make sure below 140/90   Healthy lifestyle includes : At least 150 minutes of exercise weeks  , weight at healthy levels, which is usually   BMI 19-25. Avoid trans fats and processed foods;  Increase fresh fruits and veges to 5 servings per day. And avoid sweet beverages including tea and juice. Mediterranean diet with olive oil and nuts have been noted to be heart and brain healthy . Avoid tobacco products . Limit  alcohol to  7 per week for women and 14 servings for men.  Get adequate sleep . Wear seat belts . Don't text and drive .       [name]. Lourdes Kucharski M.D.

## 2014-04-13 NOTE — Progress Notes (Signed)
Pre visit review using our clinic review tool, if applicable. No additional management support is needed unless otherwise documented below in the visit note. 

## 2014-04-17 ENCOUNTER — Telehealth: Payer: Self-pay | Admitting: Internal Medicine

## 2014-04-17 NOTE — Telephone Encounter (Signed)
emmi emailed °

## 2014-04-19 ENCOUNTER — Encounter: Payer: Self-pay | Admitting: Gastroenterology

## 2014-04-27 ENCOUNTER — Ambulatory Visit (AMBULATORY_SURGERY_CENTER): Payer: 59 | Admitting: Gastroenterology

## 2014-04-27 ENCOUNTER — Encounter: Payer: Self-pay | Admitting: Gastroenterology

## 2014-04-27 VITALS — BP 120/74 | HR 72 | Temp 98.4°F | Resp 16 | Ht 67.0 in | Wt 214.0 lb

## 2014-04-27 DIAGNOSIS — Z800 Family history of malignant neoplasm of digestive organs: Secondary | ICD-10-CM

## 2014-04-27 DIAGNOSIS — D122 Benign neoplasm of ascending colon: Secondary | ICD-10-CM

## 2014-04-27 DIAGNOSIS — D120 Benign neoplasm of cecum: Secondary | ICD-10-CM

## 2014-04-27 DIAGNOSIS — Z1211 Encounter for screening for malignant neoplasm of colon: Secondary | ICD-10-CM

## 2014-04-27 MED ORDER — SODIUM CHLORIDE 0.9 % IV SOLN: 500.0000 mL | INTRAVENOUS | Status: DC

## 2014-04-27 NOTE — Progress Notes (Signed)
Called to room to assist during endoscopic procedure.  Patient ID and intended procedure confirmed with present staff. Received instructions for my participation in the procedure from the performing physician.  

## 2014-04-27 NOTE — Patient Instructions (Signed)
Impressions/recommedations:  Polyps (handout given) Hemorrhoids (handout given)  Hold aspirin and all NSAIDS for two weeks. May resume 05/12/2014.  Repeat colonoscopy in 5 years.  YOU HAD AN ENDOSCOPIC PROCEDURE TODAY AT [facility]: Refer to the procedure report that was given to you for any specific questions about what was found during the examination.  If the procedure report does not answer your questions, please call your gastroenterologist to clarify.  If you requested that your care partner not be given the details of your procedure findings, then the procedure report has been included in a sealed envelope for you to review at your convenience later.  YOU SHOULD EXPECT: Some feelings of bloating in the abdomen. Passage of more gas than usual.  Walking can help get rid of the air that was put into your GI tract during the procedure and reduce the bloating. If you had a lower endoscopy (such as a colonoscopy or flexible sigmoidoscopy) you may notice spotting of blood in your stool or on the toilet paper. If you underwent a bowel prep for your procedure, then you may not have a normal bowel movement for a few days.  DIET: Your first meal following the procedure should be a light meal and then it is ok to progress to your normal diet.  A half-sandwich or bowl of soup is an example of a good first meal.  Heavy or fried foods are harder to digest and may make you feel nauseous or bloated.  Likewise meals heavy in dairy and vegetables can cause extra gas to form and this can also increase the bloating.  Drink plenty of fluids but you should avoid alcoholic beverages for 24 hours.  ACTIVITY: Your care partner should take you home directly after the procedure.  You should plan to take it easy, moving slowly for the rest of the day.  You can resume normal activity the day after the procedure however you should NOT DRIVE or use heavy machinery for 24 hours (because of the sedation  medicines used during the test).    SYMPTOMS TO REPORT IMMEDIATELY: A gastroenterologist can be reached at any hour.  During normal business hours, 8:30 AM to 5:00 PM Monday through Friday, call [phone].  After hours and on weekends, please call the GI answering service at [phone] who will take a message and have the physician on call contact you.   Following lower endoscopy (colonoscopy or flexible sigmoidoscopy):  Excessive amounts of blood in the stool  Significant tenderness or worsening of abdominal pains  Swelling of the abdomen that is new, acute  Fever of 100F or higher   FOLLOW UP: If any biopsies were taken you will be contacted by phone or by letter within the next 1-3 weeks.  Call your gastroenterologist if you have not heard about the biopsies in 3 weeks.  Our staff will call the home number listed on your records the next business day following your procedure to check on you and address any questions or concerns that you may have at that time regarding the information given to you following your procedure. This is a courtesy call and so if there is no answer at the home number and we have not heard from you through the emergency physician on call, we will assume that you have returned to your regular daily activities without incident.  SIGNATURES/CONFIDENTIALITY: You and/or your care partner have signed paperwork which will be entered into your electronic medical record.  These signatures  attest to the fact that that the information above on your After Visit Summary has been reviewed and is understood.  Full responsibility of the confidentiality of this discharge information lies with you and/or your care-partner.

## 2014-04-27 NOTE — Progress Notes (Signed)
Report to PACU, RN, vss, BBS= Clear.  

## 2014-04-27 NOTE — Op Note (Signed)
[facility]  [address] [place], [id number]   COLONOSCOPY PROCEDURE REPORT  PATIENT: Laura Smith, Laura Smith  MR#: [id number] BIRTHDATE: 07-11-1960 , 23  yrs. old GENDER: female ENDOSCOPIST: Ladene Artist, MD, [facility] PROCEDURE DATE:  04/27/2014 PROCEDURE:   Colonoscopy with biopsy and Colonoscopy with snare polypectomy First Screening Colonoscopy - Avg.  risk and is 50 yrs.  old or older - No.  Prior Negative Screening - Now for repeat screening. N/A  History of Adenoma - Now for follow-up colonoscopy & has been > or = to 3 yrs.  N/A  Polyps Removed Today? Yes. ASA CLASS:   Class II INDICATIONS:patient's immediate family history of colon cancer. MEDICATIONS: Propofol and Propofol 240 mg IV DESCRIPTION OF PROCEDURE:   After the risks benefits and alternatives of the procedure were thoroughly explained, informed consent was obtained.  The digital rectal exam revealed no abnormalities of the rectum.   The LB [id number]  endoscope was introduced through the anus and advanced to the cecum, which was identified by both the appendix and ileocecal valve. No adverse events experienced.   The quality of the prep was good, using MoviPrep  The instrument was then slowly withdrawn as the colon was fully examined.  COLON FINDINGS: A sessile polyp measuring 4 mm in size was found at the ileocecal valve.  A polypectomy was performed with cold forceps.  The resection was complete, the polyp tissue was completely retrieved and sent to histology.   A sessile polyp measuring 10 mm in size was found in the ascending colon.  A polypectomy was performed using snare cautery.  The resection was complete, the polyp tissue was completely retrieved and sent to histology.   The examination was otherwise normal.  Retroflexed views revealed internal Grade I hemorrhoids. The time to cecum=3 minutes 02 seconds.  Withdrawal time=10 minutes 44 seconds.  The scope was withdrawn and the procedure  completed.  COMPLICATIONS: There were no immediate complications.  ENDOSCOPIC IMPRESSION: 1.   Sessile polyp at the ileocecal valve; polypectomy performed with cold forceps 2.   Sessile polyp in the ascending colon; polypectomy performed using snare cautery 3.   Grade I internal hemorrhoids  RECOMMENDATIONS: 1.  Await pathology results 2.  Hold Aspirin and all other NSAIDS for 2 weeks. 3.  Repeat Colonoscopy in 5 years.  eSigned:  Ladene Artist, MD, [facility] 04/27/2014 10:19 AM

## 2014-04-30 ENCOUNTER — Telehealth: Payer: Self-pay | Admitting: *Deleted

## 2014-04-30 NOTE — Telephone Encounter (Signed)
  Follow up Call-  Call back number 04/27/2014  Post procedure Call Back phone  # [phone]  Permission to leave phone message Yes     Patient questions:  Do you have a fever, pain , or abdominal swelling? No. Pain Score  0 *  Have you tolerated food without any problems? Yes.    Have you been able to return to your normal activities? Yes.    Do you have any questions about your discharge instructions: Diet   No. Medications  No. Follow up visit  No.  Do you have questions or concerns about your Care? No.  Actions: * If pain score is 4 or above: No action needed, pain <4.  Patient stating she is back to normal. Stating her abdomen felt,"funny" post procedure. Yet denied swelling, pain,fever or bleeding.

## 2014-05-02 ENCOUNTER — Encounter: Payer: Self-pay | Admitting: Gastroenterology

## 2014-05-04 ENCOUNTER — Encounter: Payer: Self-pay | Admitting: Gynecology

## 2014-05-04 ENCOUNTER — Ambulatory Visit (INDEPENDENT_AMBULATORY_CARE_PROVIDER_SITE_OTHER): Payer: 59 | Admitting: Gynecology

## 2014-05-04 VITALS — BP 130/80 | Ht 68.0 in | Wt 217.0 lb

## 2014-05-04 DIAGNOSIS — K649 Unspecified hemorrhoids: Secondary | ICD-10-CM

## 2014-05-04 DIAGNOSIS — N951 Menopausal and female climacteric states: Secondary | ICD-10-CM

## 2014-05-04 DIAGNOSIS — Z01419 Encounter for gynecological examination (general) (routine) without abnormal findings: Secondary | ICD-10-CM

## 2014-05-04 NOTE — Progress Notes (Signed)
GINNI EICHLER 05-Jul-1960 [id number]        53 y.o.  G1P1001 for annual exam.  Several issues noted below.  Past medical history,surgical history, problem list, medications, allergies, family history and social history were all reviewed and documented as reviewed in the EPIC chart.  ROS:  12 system ROS performed with pertinent positives and negatives included in the history, assessment and plan.   Additional significant findings :  none   Exam: Kim Counsellor Vitals:   05/04/14 1211  BP: 130/80  Height: 5\' 8"  (1.727 m)  Weight: 217 lb (98.431 kg)   General appearance:  Normal affect, orientation and appearance. Skin: Grossly normal HEENT: Without gross lesions.  No cervical or supraclavicular adenopathy. Thyroid normal.  Lungs:  Clear without wheezing, rales or rhonchi Cardiac: RR, without RMG Abdominal:  Soft, nontender, without masses, guarding, rebound, organomegaly or hernia Breasts:  Examined lying and sitting without masses, retractions, discharge or axillary adenopathy. Pelvic:  Ext/BUS/vagina Normal with mild atrophic changes  Cervix normal  Uterus axial to anteverted, normal size, shape and contour, midline and mobile nontender   Adnexa  Without masses or tenderness    Anus and perineum  Normal with old external hemorrhoids  Rectovaginal  Normal sphincter tone without palpated masses or tenderness.    Assessment/Plan:  53 y.o. G43P1001 female for annual exam.   1. Postmenopausal/atrophic genital changes. Patient is having some night sweats but overall tolerable. Does not want intervention. The vaginal dryness or dyspareunia. No vaginal bleeding. Continue to monitor. Report any vaginal bleeding. 2. Pap smear/HPV negative 2014. No Pap smear done today. No history of significant abnormal Pap smears previously. Plan repeat Pap smear at 3-5 year interval per current screening guidelines. 3. Mammography 09/2013. Continue with annual mammography. SBE monthly  reviewed. 4. Colonoscopy 2015. Did find some atypical polyps with recommended repeat interval 5 years. 5. Old external hemorrhoids. Not overly bothersome to the patient. Does not want intervention. 6. Health maintenance. No routine blood work done and she reports this done at her primary physician's office. Follow up 1 year, sooner as needed.     Anastasio Auerbach MD, 12:32 PM 05/04/2014

## 2014-05-04 NOTE — Patient Instructions (Signed)
You may obtain a copy of any labs that were done today by logging onto MyChart as outlined in the instructions provided with your AVS (after visit summary). The office will not call with normal lab results but certainly if there are any significant abnormalities then we will contact you.   Health Maintenance, Female A healthy lifestyle and preventative care can promote health and wellness.  Maintain regular health, dental, and eye exams.  Eat a healthy diet. Foods like vegetables, fruits, whole grains, low-fat dairy products, and lean protein foods contain the nutrients you need without too many calories. Decrease your intake of foods high in solid fats, added sugars, and salt. Get information about a proper diet from your caregiver, if necessary.  Regular physical exercise is one of the most important things you can do for your health. Most adults should get at least 150 minutes of moderate-intensity exercise (any activity that increases your heart rate and causes you to sweat) each week. In addition, most adults need muscle-strengthening exercises on 2 or more days a week.   Maintain a healthy weight. The body mass index (BMI) is a screening tool to identify possible weight problems. It provides an estimate of body fat based on height and weight. Your caregiver can help determine your BMI, and can help you achieve or maintain a healthy weight. For adults 20 years and older:  A BMI below 18.5 is considered underweight.  A BMI of 18.5 to 24.9 is normal.  A BMI of 25 to 29.9 is considered overweight.  A BMI of 30 and above is considered obese.  Maintain normal blood lipids and cholesterol by exercising and minimizing your intake of saturated fat. Eat a balanced diet with plenty of fruits and vegetables. Blood tests for lipids and cholesterol should begin at age 61 and be repeated every 5 years. If your lipid or cholesterol levels are high, you are over 50, or you are a high risk for heart  disease, you may need your cholesterol levels checked more frequently.Ongoing high lipid and cholesterol levels should be treated with medicines if diet and exercise are not effective.  If you smoke, find out from your caregiver how to quit. If you do not use tobacco, do not start.  Lung cancer screening is recommended for adults aged 33 80 years who are at high risk for developing lung cancer because of a history of smoking. Yearly low-dose computed tomography (CT) is recommended for people who have at least a 30-pack-year history of smoking and are a current smoker or have quit within the past 15 years. A pack year of smoking is smoking an average of 1 pack of cigarettes a day for 1 year (for example: 1 pack a day for 30 years or 2 packs a day for 15 years). Yearly screening should continue until the smoker has stopped smoking for at least 15 years. Yearly screening should also be stopped for people who develop a health problem that would prevent them from having lung cancer treatment.  If you are pregnant, do not drink alcohol. If you are breastfeeding, be very cautious about drinking alcohol. If you are not pregnant and choose to drink alcohol, do not exceed 1 drink per day. One drink is considered to be 12 ounces (355 mL) of beer, 5 ounces (148 mL) of wine, or 1.5 ounces (44 mL) of liquor.  Avoid use of street drugs. Do not share needles with anyone. Ask for help if you need support or instructions about stopping  the use of drugs.  High blood pressure causes heart disease and increases the risk of stroke. Blood pressure should be checked at least every 1 to 2 years. Ongoing high blood pressure should be treated with medicines, if weight loss and exercise are not effective.  If you are 59 to 54 years old, ask your caregiver if you should take aspirin to prevent strokes.  Diabetes screening involves taking a blood sample to check your fasting blood sugar level. This should be done once every 3  years, after age 91, if you are within normal weight and without risk factors for diabetes. Testing should be considered at a younger age or be carried out more frequently if you are overweight and have at least 1 risk factor for diabetes.  Breast cancer screening is essential preventative care for women. You should practice "breast self-awareness." This means understanding the normal appearance and feel of your breasts and may include breast self-examination. Any changes detected, no matter how small, should be reported to a caregiver. Women in their 66s and 30s should have a clinical breast exam (CBE) by a caregiver as part of a regular health exam every 1 to 3 years. After age 101, women should have a CBE every year. Starting at age 100, women should consider having a mammogram (breast X-ray) every year. Women who have a family history of breast cancer should talk to their caregiver about genetic screening. Women at a high risk of breast cancer should talk to their caregiver about having an MRI and a mammogram every year.  Breast cancer gene (BRCA)-related cancer risk assessment is recommended for women who have family members with BRCA-related cancers. BRCA-related cancers include breast, ovarian, tubal, and peritoneal cancers. Having family members with these cancers may be associated with an increased risk for harmful changes (mutations) in the breast cancer genes BRCA1 and BRCA2. Results of the assessment will determine the need for genetic counseling and BRCA1 and BRCA2 testing.  The Pap test is a screening test for cervical cancer. Women should have a Pap test starting at age 57. Between ages 25 and 35, Pap tests should be repeated every 2 years. Beginning at age 37, you should have a Pap test every 3 years as long as the past 3 Pap tests have been normal. If you had a hysterectomy for a problem that was not cancer or a condition that could lead to cancer, then you no longer need Pap tests. If you are  between ages 50 and 76, and you have had normal Pap tests going back 10 years, you no longer need Pap tests. If you have had past treatment for cervical cancer or a condition that could lead to cancer, you need Pap tests and screening for cancer for at least 20 years after your treatment. If Pap tests have been discontinued, risk factors (such as a new sexual partner) need to be reassessed to determine if screening should be resumed. Some women have medical problems that increase the chance of getting cervical cancer. In these cases, your caregiver may recommend more frequent screening and Pap tests.  The human papillomavirus (HPV) test is an additional test that may be used for cervical cancer screening. The HPV test looks for the virus that can cause the cell changes on the cervix. The cells collected during the Pap test can be tested for HPV. The HPV test could be used to screen women aged 44 years and older, and should be used in women of any age  who have unclear Pap test results. After the age of 55, women should have HPV testing at the same frequency as a Pap test.  Colorectal cancer can be detected and often prevented. Most routine colorectal cancer screening begins at the age of 44 and continues through age 20. However, your caregiver may recommend screening at an earlier age if you have risk factors for colon cancer. On a yearly basis, your caregiver may provide home test kits to check for hidden blood in the stool. Use of a small camera at the end of a tube, to directly examine the colon (sigmoidoscopy or colonoscopy), can detect the earliest forms of colorectal cancer. Talk to your caregiver about this at age 86, when routine screening begins. Direct examination of the colon should be repeated every 5 to 10 years through age 13, unless early forms of pre-cancerous polyps or small growths are found.  Hepatitis C blood testing is recommended for all people born from [birth year] through 1966 and any  individual with known risks for hepatitis C.  Practice safe sex. Use condoms and avoid high-risk sexual practices to reduce the spread of sexually transmitted infections (STIs). Sexually active women aged 36 and younger should be checked for Chlamydia, which is a common sexually transmitted infection. Older women with new or multiple partners should also be tested for Chlamydia. Testing for other STIs is recommended if you are sexually active and at increased risk.  Osteoporosis is a disease in which the bones lose minerals and strength with aging. This can result in serious bone fractures. The risk of osteoporosis can be identified using a bone density scan. Women ages 20 and over and women at risk for fractures or osteoporosis should discuss screening with their caregivers. Ask your caregiver whether you should be taking a calcium supplement or vitamin D to reduce the rate of osteoporosis.  Menopause can be associated with physical symptoms and risks. Hormone replacement therapy is available to decrease symptoms and risks. You should talk to your caregiver about whether hormone replacement therapy is right for you.  Use sunscreen. Apply sunscreen liberally and repeatedly throughout the day. You should seek shade when your shadow is shorter than you. Protect yourself by wearing long sleeves, pants, a wide-brimmed hat, and sunglasses year round, whenever you are outdoors.  Notify your caregiver of new moles or changes in moles, especially if there is a change in shape or color. Also notify your caregiver if a mole is larger than the size of a pencil eraser.  Stay current with your immunizations. Document Released: 12/22/2010 Document Revised: 10/03/2012 Document Reviewed: 12/22/2010 [facility] Patient Information 2014 [employer].

## 2014-05-05 LAB — URINALYSIS W MICROSCOPIC + REFLEX CULTURE
Bacteria, UA: NONE SEEN
Bilirubin Urine: NEGATIVE
Casts: NONE SEEN
Crystals: NONE SEEN
Glucose, UA: NEGATIVE mg/dL
Hgb urine dipstick: NEGATIVE
Ketones, ur: NEGATIVE mg/dL
Leukocytes, UA: NEGATIVE
Nitrite: NEGATIVE
Protein, ur: NEGATIVE mg/dL
Specific Gravity, Urine: 1.017 (ref 1.005–1.030)
Squamous Epithelial / LPF: NONE SEEN
Urobilinogen, UA: 0.2 mg/dL (ref 0.0–1.0)
pH: 5 (ref 5.0–8.0)

## 2014-05-29 ENCOUNTER — Encounter: Payer: Self-pay | Admitting: Gynecology

## 2014-05-30 ENCOUNTER — Ambulatory Visit (INDEPENDENT_AMBULATORY_CARE_PROVIDER_SITE_OTHER): Payer: 59 | Admitting: Women's Health

## 2014-05-30 ENCOUNTER — Encounter: Payer: Self-pay | Admitting: Women's Health

## 2014-05-30 VITALS — BP 130/82 | Ht 67.0 in | Wt 219.0 lb

## 2014-05-30 DIAGNOSIS — B373 Candidiasis of vulva and vagina: Secondary | ICD-10-CM

## 2014-05-30 DIAGNOSIS — B3731 Acute candidiasis of vulva and vagina: Secondary | ICD-10-CM

## 2014-05-30 DIAGNOSIS — N949 Unspecified condition associated with female genital organs and menstrual cycle: Secondary | ICD-10-CM

## 2014-05-30 DIAGNOSIS — R399 Unspecified symptoms and signs involving the genitourinary system: Secondary | ICD-10-CM

## 2014-05-30 LAB — URINALYSIS W MICROSCOPIC + REFLEX CULTURE
Bilirubin Urine: NEGATIVE
Casts: NONE SEEN
Crystals: NONE SEEN
Glucose, UA: NEGATIVE mg/dL
Ketones, ur: NEGATIVE mg/dL
Nitrite: NEGATIVE
Protein, ur: NEGATIVE mg/dL
Specific Gravity, Urine: <1.005 — ABNORMAL LOW (ref 1.005–1.030)
Urobilinogen, UA: 0.2 mg/dL (ref 0.0–1.0)
pH: 5.5 (ref 5.0–8.0)

## 2014-05-30 MED ORDER — FLUCONAZOLE 150 MG PO TABS: 150.0000 mg | ORAL_TABLET | Freq: Once | ORAL | Status: DC

## 2014-05-30 MED ORDER — SULFAMETHOXAZOLE-TRIMETHOPRIM 800-160 MG PO TABS: 1.0000 | ORAL_TABLET | Freq: Two times a day (BID) | ORAL | Status: DC

## 2014-05-30 NOTE — Patient Instructions (Signed)

## 2014-05-30 NOTE — Progress Notes (Signed)
Patient ID: Laura Smith, female   DOB: 1961-02-08, 53 y.o.   MRN: [id number] Presents with complaint of one day urinary burning, pressure, frequency, pain especially at end of stream. Not sexually active, husband has ED. Postmenopausal. Slight vaginal burning associated with urination. Slight left lower quadrant pain. Denies constipation or change. Denies vaginal discharge, itching, odor or fever.  Exam: Appears well. External genitalia within normal limits, atrophic, speculum exam no visible erythema, discharge, odor. Bimanual no CMT or adnexal fullness or tenderness. UA: Moderate blood, trace leukocytes, 7-11 WBCs, 11-20 rbc's, few bacteria also noted a few yeast buds.  UTI with hematuria Yeast  Plan: Septra twice daily for 3 days #6, UTI prevention discussed, instructed to call if no relief of symptoms. Urine culture pending. Diflucan 150 by mouth 1 dose.

## 2014-06-02 LAB — URINE CULTURE: Colony Count: 60000

## 2014-06-21 ENCOUNTER — Ambulatory Visit (INDEPENDENT_AMBULATORY_CARE_PROVIDER_SITE_OTHER): Payer: 59 | Admitting: Family Medicine

## 2014-06-21 ENCOUNTER — Encounter: Payer: Self-pay | Admitting: Family Medicine

## 2014-06-21 VITALS — BP 130/80 | HR 91 | Temp 97.8°F | Wt 213.0 lb

## 2014-06-21 DIAGNOSIS — R059 Cough, unspecified: Secondary | ICD-10-CM

## 2014-06-21 DIAGNOSIS — R062 Wheezing: Secondary | ICD-10-CM

## 2014-06-21 DIAGNOSIS — R05 Cough: Secondary | ICD-10-CM

## 2014-06-21 MED ORDER — HYDROCOD POLST-CHLORPHEN POLST 10-8 MG/5ML PO LQCR: 5.0000 mL | Freq: Two times a day (BID) | ORAL | Status: DC | PRN

## 2014-06-21 MED ORDER — AZITHROMYCIN 250 MG PO TABS: ORAL_TABLET | ORAL | Status: DC

## 2014-06-21 MED ORDER — PREDNISONE 10 MG PO TABS: ORAL_TABLET | ORAL | Status: DC

## 2014-06-21 NOTE — Progress Notes (Signed)
Pre visit review using our clinic review tool, if applicable. No additional management support is needed unless otherwise documented below in the visit note. 

## 2014-06-21 NOTE — Patient Instructions (Signed)

## 2014-06-21 NOTE — Progress Notes (Signed)
   Subjective:    Patient ID: Laura Smith, female    DOB: 05-23-1961, 53 y.o.   MRN: [id number]  HPI Acute visit. Patient seen with ten-day history of cough and nasal sinus congestion. She's tried many over-the-counter medications including Mucinex DM, DayQuil, and NyQuil without relief. She has developed some wheezing past few days. Nonsmoker. No history of asthma. She's had some sinus congestive symptoms. Her cough is productive of green to yellow sputum.  Past Medical History  Diagnosis Date  . Migraine   . Hemorrhoids   . Hypertension   . Endometriosis     surgery laser 2001  . Hx of basal cell carcinoma   . Allergy    Past Surgical History  Procedure Laterality Date  . Appendectomy  1992    AT C/S  . Cesarean section  1992    APPENDECTOMY SAME TIME  . Pelvic laparoscopy  08/1999    AND HYSTEROSCOPY--ENDOMETRIOSIS  . Hysteroscopy  08/1999    AT TIME OF LAPAROSCOPY    reports that she has never smoked. She has never used smokeless tobacco. She reports that she does not drink alcohol or use illicit drugs. family history includes Breast cancer in her cousin and mother; Cancer in her paternal grandfather and paternal grandmother; Cancer (age of onset: 27) in her mother; Colon cancer (age of onset: 58) in her mother; Heart disease in her father, paternal grandfather, and paternal grandmother; Hypertension in her paternal grandfather and paternal grandmother; Stroke in her maternal grandfather and paternal grandmother. Allergies  Allergen Reactions  . Etodolac Hives    Possible anaphylaxis  With vomiting and throat feeling tight  ED visit 8 14   . Latex Hives, Rash and Other (See Comments)    Causes blisters  . Other Hives, Rash and Other (See Comments)    Gel used for ultrasound-causes blisters also   . Adhesive [Tape] Itching and Rash      Review of Systems  Constitutional: Negative for fever and chills.  HENT: Positive for congestion and sinus pressure.     Respiratory: Positive for cough and wheezing.        Objective:   Physical Exam  Constitutional: She appears well-developed and well-nourished.  HENT:  Right Ear: External ear normal.  Left Ear: External ear normal.  Mouth/Throat: Oropharynx is clear and moist.  Neck: Neck supple.  Cardiovascular: Normal rate and regular rhythm.   Pulmonary/Chest: Effort normal.  She has some diffuse wheezes. No rales. No retractions  Lymphadenopathy:    She has no cervical adenopathy.          Assessment & Plan:  She has cough with reactive very component. Progressive symptoms over 10 days. Prednisone taper. Reviewed possible side effects. Start Zithromax. Limited Tussionex 1 teaspoon daily at bedtime for severe coughing. Follow-up as needed

## 2014-11-13 ENCOUNTER — Other Ambulatory Visit: Payer: Self-pay | Admitting: Internal Medicine

## 2014-11-13 NOTE — Telephone Encounter (Signed)
Sent to the pharmacy by e-scribe. 

## 2014-12-18 ENCOUNTER — Telehealth: Payer: Self-pay | Admitting: Internal Medicine

## 2014-12-18 NOTE — Telephone Encounter (Signed)
Patient Name: Laura Smith DOB: [birth year] Initial Comment Caller states she has rash under her breast, itching, not going away. Nurse Assessment Nurse: Vallery Sa, RN, Cathy Date/Time (Eastern Time): 12/18/2014 1:35:16 PM Confirm and document reason for call. If symptomatic, describe symptoms. ---Caller states she developed an itchy rash under her right breast about 2 weeks ago. No fever. Has the patient traveled out of the country within the last 30 days? ---No Does the patient require triage? ---Yes Related visit to physician within the last 2 weeks? ---No Does the PT have any chronic conditions? (i.e. diabetes, asthma, etc.) ---Yes List chronic conditions. ---high Blood Pressure, Allergies Did the patient indicate they were pregnant? ---No Guidelines Guideline Title Affirmed Question Affirmed Notes Rash or Redness - Localized [1] Looks infected (spreading redness, pus) AND [2] no fever Final Disposition User See Physician within [facility] declined being seen within 24 hours because she has to work. She states she will only be seen on Friday and requests to schedule an appointment on Friday. Scheduled appointment with Dr. Regis Bill on Friday, 12/21/14 at 9:30am per caller's request.

## 2014-12-21 ENCOUNTER — Encounter: Payer: Self-pay | Admitting: Internal Medicine

## 2014-12-21 ENCOUNTER — Ambulatory Visit (INDEPENDENT_AMBULATORY_CARE_PROVIDER_SITE_OTHER): Payer: 59 | Admitting: Internal Medicine

## 2014-12-21 VITALS — BP 134/90 | Temp 98.1°F | Ht 67.0 in | Wt 221.3 lb

## 2014-12-21 DIAGNOSIS — L304 Erythema intertrigo: Secondary | ICD-10-CM | POA: Diagnosis not present

## 2014-12-21 MED ORDER — NYSTATIN 100000 UNIT/GM EX POWD: CUTANEOUS | Status: DC

## 2014-12-21 NOTE — Progress Notes (Signed)
Pre visit review using our clinic review tool, if applicable. No additional management support is needed unless otherwise documented below in the visit note.  Chief Complaint  Patient presents with  . Rash    Started 2-3 weeks ago.  Under each breast.    HPI: Patient Laura Smith  comes in today for SDA for  new problem evaluation. Onset of itchy almost painful really red rash under both breasts right more than left about 2-3 weeks ago. It was associated with helping at parents house that had no air conditioning. She was also using her husband's vehicle without air conditioning. She has had a lot of sweating with this. Getting better "itching dan driving crazy  " Dec not use bra for a few days  Used gold bond medicated power every day  neopsorin at this time . Neosporin didn't help but now it is getting better but she still itches.  ROS: See pertinent positives and negatives per HPI.  Past Medical History  Diagnosis Date  . Migraine   . Hemorrhoids   . Hypertension   . Endometriosis     surgery laser 2001  . Hx of basal cell carcinoma   . Allergy    husband having difficulty with eye decreased vision left with uveitis positive test for toxoplasmosis from the blood.  Family History  Problem Relation Age of Onset  . Cancer Mother 6    COLON  . Breast cancer Mother     Age 38  . Colon cancer Mother 66  . Heart disease Father   . Hypertension Paternal Grandmother   . Heart disease Paternal Grandmother   . Stroke Paternal Grandmother   . Cancer Paternal Grandmother     Lung  . Hypertension Paternal Grandfather   . Heart disease Paternal Grandfather   . Cancer Paternal Grandfather     Prostate  . Breast cancer Cousin     Age 47's  . Stroke Maternal Grandfather     History   Social History  . Marital Status: Married    Spouse Name: N/A  . Number of Children: N/A  . Years of Education: N/A   Social History Main Topics  . Smoking status: Never Smoker   .  Smokeless tobacco: Never Used  . Alcohol Use: No  . Drug Use: No  . Sexual Activity: No   Other Topics Concern  . None   Social History Narrative   h h of 2 Pet cat   Daughter /  Married    g1 p1   Works [place]    [employer]     Outpatient Prescriptions Prior to Visit  Medication Sig Dispense Refill  . Ascorbic Acid (VITAMIN C) 500 MG CAPS Take 1,000 mg by mouth every morning.     Marland Kitchen b complex vitamins tablet Take 2 tablets by mouth daily.     Marland Kitchen CALCIUM-MAGNESIUM PO Take 3 tablets by mouth daily.     . cetirizine (ZYRTEC) 10 MG tablet Take 10 mg by mouth daily.    . chlorpheniramine-HYDROcodone (TUSSIONEX PENNKINETIC ER) 10-8 MG/5ML LQCR Take 5 mLs by mouth every 12 (twelve) hours as needed for cough. 115 mL 0  . hydrochlorothiazide (HYDRODIURIL) 25 MG tablet TAKE 1 TABLET DAILY 90 tablet 3  . HYDROcodone-acetaminophen (NORCO) 7.5-325 MG per tablet Take 1-2 tablets by mouth every 4 (four) hours as needed for pain (as needed for migraine).     Marland Kitchen HYDROmorphone (DILAUDID) 4 MG tablet Take 4-8  mg by mouth every 4 (four) hours as needed for pain (rescure for migraine).     . meclizine (ANTIVERT) 25 MG tablet TAKE 2 TABLET (50 MG TOTAL) BY MOUTH 2 (TWO) TIMES DAILY AS NEEDED (FOR TRAVEL). 30 tablet 0  . montelukast (SINGULAIR) 10 MG tablet Take 1 tablet (10 mg total) by mouth daily as needed (for seasonal allergies, uses as needed). 90 tablet 1  . Multiple Vitamins-Iron (MULTIVITAMIN/IRON) TABS Take 1 tablet by mouth daily.     . sulindac (CLINORIL) 200 MG tablet Take 200 mg by mouth 2 (two) times daily as needed (headache).    . SUMAtriptan (IMITREX) 6 MG/0.5ML SOLN injection Inject 6 mg into the skin every 2 (two) hours as needed for migraine. F    . verapamil (CALAN-SR) 180 MG CR tablet TAKE 1 TABLET AT BEDTIME 90 tablet 3  . ZOLMitriptan (ZOMIG NA) Place 1 spray into the nose daily as needed (for migraines).     Marland Kitchen azithromycin (ZITHROMAX) 250 MG tablet Take 2  tablets today and then one daily for 4 days 6 each 0  . predniSONE (DELTASONE) 10 MG tablet Taper as follows:4-4-4-3-3-2-2 22 tablet 0  . sulfamethoxazole-trimethoprim (BACTRIM DS) 800-160 MG per tablet Take 1 tablet by mouth 2 (two) times daily. 6 tablet 0   No facility-administered medications prior to visit.     EXAM:  BP 134/90 mmHg  Temp(Src) 98.1 F (36.7 C) (Oral)  Ht 5\' 7"  (1.702 m)  Wt 221 lb 4.8 oz (100.381 kg)  BMI 34.65 kg/m2  Body mass index is 34.65 kg/(m^2).  GENERAL: vitals reviewed and listed above, alert, oriented, appears well hydrated and in no acute distress Area under both breasts left more than right very faint erythema in the intertriginous area no weeping no satellite lesions skin otherwise clear. PSYCH: pleasant and cooperative, no obvious depression or anxiety  ASSESSMENT AND PLAN:  Discussed the following assessment and plan:  Pruritic intertrigo - No underlying disease obvious discussed natural history prevention treatment. Looks pretty good at this time this isn't shingles Check into shingles reimbursement and come back for this at some point. Use low potency hydrocortisone stay dry add anti-fungal powder as needed when flares up. -Patient advised to return or notify health care team  if symptoms worsen ,persist or new concerns arise.   Also discussed husbands predicament he is being treated for loss of vision in one eye and is a truck driver being evaluated for uveitis question positive toxoplasmosis. Discussed with her infectious disease discussed with retinal specialist etc. ask if infectious disease consult is appropriate. Patient Instructions  This is intertrigo . Usually from  Moist environment Stay dry .   And can use hydrocortisone  otc  2 -3 x per day .  And add antifungal powder   2-3 x per day when flares   Check uptodate.com  Intertrigo Intertrigo is a skin condition that occurs in between folds of skin in places on the body that rub  together a lot and do not get much ventilation. It is caused by heat, moisture, friction, sweat retention, and lack of air circulation, which produces red, irritated patches and, sometimes, scaling or drainage. People who have diabetes, who are obese, or who have treatment with antibiotics are at increased risk for intertrigo. The most common sites for intertrigo to occur include:  The groin.  The breasts.  The armpits.  Folds of abdominal skin.  Webbed spaces between the fingers or toes. Intertrigo may be aggravated by:  Sweat.  Feces.  Yeast or bacteria that are present near skin folds.  Urine.  Vaginal discharge. HOME CARE INSTRUCTIONS  The following steps can be taken to reduce friction and keep the affected area cool and dry:  Expose skin folds to the air.  Keep deep skin folds separated with cotton or linen cloth. Avoid tight fitting clothing that could cause chafing.  Wear open-toed shoes or sandals to help reduce moisture between the toes.  Apply absorbent powders to affected areas as directed by your caregiver.  Apply over-the-counter barrier pastes, such as zinc oxide, as directed by your caregiver.  If you develop a fungal infection in the affected area, your caregiver may have you use antifungal creams. SEEK MEDICAL CARE IF:   The rash is not improving after 1 week of treatment.  The rash is getting worse (more red, more swollen, more painful, or spreading).  You have a fever or chills. MAKE SURE YOU:   Understand these instructions.  Will watch your condition.  Will get help right away if you are not doing well or get worse. Document Released: 06/08/2005 Document Revised: 08/31/2011 Document Reviewed: 11/21/2009 [facility] Patient Information 2015 [place], [place]. This information is not intended to replace advice given to you by your health care provider. Make sure you discuss any questions you have with your health care provider.      [name].  Maimouna Rondeau M.D.

## 2014-12-21 NOTE — Patient Instructions (Addendum)
This is intertrigo . Usually from  Moist environment Stay dry .   And can use hydrocortisone  otc  2 -3 x per day .  And add antifungal powder   2-3 x per day when flares   Check uptodate.com  Intertrigo Intertrigo is a skin condition that occurs in between folds of skin in places on the body that rub together a lot and do not get much ventilation. It is caused by heat, moisture, friction, sweat retention, and lack of air circulation, which produces red, irritated patches and, sometimes, scaling or drainage. People who have diabetes, who are obese, or who have treatment with antibiotics are at increased risk for intertrigo. The most common sites for intertrigo to occur include:  The groin.  The breasts.  The armpits.  Folds of abdominal skin.  Webbed spaces between the fingers or toes. Intertrigo may be aggravated by:  Sweat.  Feces.  Yeast or bacteria that are present near skin folds.  Urine.  Vaginal discharge. HOME CARE INSTRUCTIONS  The following steps can be taken to reduce friction and keep the affected area cool and dry:  Expose skin folds to the air.  Keep deep skin folds separated with cotton or linen cloth. Avoid tight fitting clothing that could cause chafing.  Wear open-toed shoes or sandals to help reduce moisture between the toes.  Apply absorbent powders to affected areas as directed by your caregiver.  Apply over-the-counter barrier pastes, such as zinc oxide, as directed by your caregiver.  If you develop a fungal infection in the affected area, your caregiver may have you use antifungal creams. SEEK MEDICAL CARE IF:   The rash is not improving after 1 week of treatment.  The rash is getting worse (more red, more swollen, more painful, or spreading).  You have a fever or chills. MAKE SURE YOU:   Understand these instructions.  Will watch your condition.  Will get help right away if you are not doing well or get worse. Document Released:  06/08/2005 Document Revised: 08/31/2011 Document Reviewed: 11/21/2009 [facility] Patient Information 2015 [place], [place]. This information is not intended to replace advice given to you by your health care provider. Make sure you discuss any questions you have with your health care provider.

## 2015-02-01 ENCOUNTER — Telehealth: Payer: Self-pay | Admitting: Internal Medicine

## 2015-02-01 ENCOUNTER — Ambulatory Visit (INDEPENDENT_AMBULATORY_CARE_PROVIDER_SITE_OTHER): Payer: 59 | Admitting: Family Medicine

## 2015-02-01 ENCOUNTER — Encounter: Payer: Self-pay | Admitting: Family Medicine

## 2015-02-01 VITALS — BP 124/84 | HR 73 | Temp 98.5°F | Wt 226.0 lb

## 2015-02-01 DIAGNOSIS — M542 Cervicalgia: Secondary | ICD-10-CM

## 2015-02-01 DIAGNOSIS — M546 Pain in thoracic spine: Secondary | ICD-10-CM | POA: Diagnosis not present

## 2015-02-01 NOTE — Telephone Encounter (Signed)
Patient Name: Laura Smith  DOB: [birth year]    Initial Comment Caller states she is having neck pain, feels like it needs to pop, and upper back pain has started now   Nurse Assessment  Nurse: Mallie Mussel, RN, Alveta Heimlich Date/Time Eilene Ghazi Time): 02/01/2015 8:14:49 AM  Confirm and document reason for call. If symptomatic, describe symptoms. ---Caller states that she has had neck pain and stiffness beginning about 1-1/2 hours ago. She can touch her chin to her chest. She is having upper back pain that just started a little while ago. She denies arm pain and jaw pain. She rates her pain as 8 on 0-10 scale and her back hurts worse than her neck. She has not taken anything for the pain yet. She has sweat breaking out on her. She states that she does not feel good. Denies difficulty breathing and nausea.  Has the patient traveled out of the country within the last 30 days? ---No  Does the patient require triage? ---Yes  Related visit to physician within the last 2 weeks? ---No  Does the PT have any chronic conditions? (i.e. diabetes, asthma, etc.) ---Yes  List chronic conditions. ---HTN, Migraines, Allergies     Guidelines    Guideline Title Affirmed Question Affirmed Notes  Back Pain Patient sounds very sick or weak to the triager Caller began with neck pain that went down into her upper back area. She has been perspiring for about an hour now. She is in an air conditioned room. There is a strong family history of hear problems, but she does have HTN.   Final Disposition User   Go to ED Now (or PCP triage) Mallie Mussel, RN, Alveta Heimlich    Comments  I admit I am concerned with this patient. Dr. Maudie Mercury has a 9:15 available, but she probably needs more than 15 minutes. There is a 10:45 and 11:00 available for Dr. Maudie Mercury. I contacted the back line and spoke with Estill Bamberg. This patient has a notation from Dr. Regis Bill that she be scheduled for 30 minutes each time. She was not able to work anything in earlier for her, but she went ahead  and scheduled her for the 10:45. Caller notified. I advised caller that if she feels like she is getting worse, she may need to go on to ER to be seen. She verbalized understanding.   Referrals  REFERRED TO PCP OFFICE   Disagree/Comply: Comply
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
## 2015-02-01 NOTE — Telephone Encounter (Signed)
Pt scheduled with Dr. Kim today.  

## 2015-04-12 ENCOUNTER — Other Ambulatory Visit (INDEPENDENT_AMBULATORY_CARE_PROVIDER_SITE_OTHER): Payer: 59

## 2015-04-12 DIAGNOSIS — Z0000 Encounter for general adult medical examination without abnormal findings: Secondary | ICD-10-CM | POA: Diagnosis not present

## 2015-04-12 LAB — BASIC METABOLIC PANEL
BUN: 13 mg/dL (ref 6–23)
CO2: 28 mEq/L (ref 19–32)
Calcium: 9.3 mg/dL (ref 8.4–10.5)
Chloride: 102 mEq/L (ref 96–112)
Creatinine, Ser: 0.9 mg/dL (ref 0.40–1.20)
GFR: 69.04 mL/min (ref >60.00)
Glucose, Bld: 101 mg/dL — ABNORMAL HIGH (ref 70–99)
Potassium: 3.8 mEq/L (ref 3.5–5.1)
Sodium: 139 mEq/L (ref 135–145)

## 2015-04-12 LAB — HEPATIC FUNCTION PANEL
ALT: 22 U/L (ref 0–35)
AST: 23 U/L (ref 0–37)
Albumin: 4 g/dL (ref 3.5–5.2)
Alkaline Phosphatase: 63 U/L (ref 39–117)
Bilirubin, Direct: 0.1 mg/dL (ref 0.0–0.3)
Total Bilirubin: 0.5 mg/dL (ref 0.2–1.2)
Total Protein: 7 g/dL (ref 6.0–8.3)

## 2015-04-12 LAB — CBC WITH DIFFERENTIAL/PLATELET
Basophils Absolute: 0 10*3/uL (ref 0.0–0.1)
Basophils Relative: 0.6 % (ref 0.0–3.0)
Eosinophils Absolute: 0.2 10*3/uL (ref 0.0–0.7)
Eosinophils Relative: 2.3 % (ref 0.0–5.0)
HCT: 42.8 % (ref 36.0–46.0)
Hemoglobin: 14.3 g/dL (ref 12.0–15.0)
Lymphocytes Relative: 35.8 % (ref 12.0–46.0)
Lymphs Abs: 2.5 10*3/uL (ref 0.7–4.0)
MCHC: 33.3 g/dL (ref 30.0–36.0)
MCV: 86.6 fl (ref 78.0–100.0)
Monocytes Absolute: 0.5 10*3/uL (ref 0.1–1.0)
Monocytes Relative: 6.6 % (ref 3.0–12.0)
Neutro Abs: 3.9 10*3/uL (ref 1.4–7.7)
Neutrophils Relative %: 54.7 % (ref 43.0–77.0)
Platelets: 317 10*3/uL (ref 150.0–400.0)
RBC: 4.94 Mil/uL (ref 3.87–5.11)
RDW: 14.3 % (ref 11.5–15.5)
WBC: 7.1 10*3/uL (ref 4.0–10.5)

## 2015-04-12 LAB — LIPID PANEL
Cholesterol: 189 mg/dL (ref 0–200)
HDL: 51 mg/dL (ref >39.00)
LDL Cholesterol: 102 mg/dL — ABNORMAL HIGH (ref 0–99)
NonHDL: 138.23
Total CHOL/HDL Ratio: 4
Triglycerides: 182 mg/dL — ABNORMAL HIGH (ref 0.0–149.0)
VLDL: 36.4 mg/dL (ref 0.0–40.0)

## 2015-04-12 LAB — TSH: TSH: 3.75 u[IU]/mL (ref 0.35–4.50)

## 2015-04-19 ENCOUNTER — Encounter: Payer: Self-pay | Admitting: Internal Medicine

## 2015-04-19 ENCOUNTER — Ambulatory Visit (INDEPENDENT_AMBULATORY_CARE_PROVIDER_SITE_OTHER): Payer: 59 | Admitting: Internal Medicine

## 2015-04-19 VITALS — BP 110/80 | HR 76 | Temp 98.9°F | Ht 67.5 in | Wt 225.0 lb

## 2015-04-19 DIAGNOSIS — Z8669 Personal history of other diseases of the nervous system and sense organs: Secondary | ICD-10-CM

## 2015-04-19 DIAGNOSIS — I10 Essential (primary) hypertension: Secondary | ICD-10-CM

## 2015-04-19 DIAGNOSIS — Z0000 Encounter for general adult medical examination without abnormal findings: Secondary | ICD-10-CM

## 2015-04-19 DIAGNOSIS — R7301 Impaired fasting glucose: Secondary | ICD-10-CM

## 2015-04-19 NOTE — Patient Instructions (Addendum)
Continue lifestyle intervention healthy eating and exercise .  Exercise can help .  Mediterranean diet  Is heart  healthy .    Exercising to Stay Healthy Exercising regularly is important. It has many health benefits, such as:  Improving your overall fitness, flexibility, and endurance.  Increasing your bone density.  Helping with weight control.  Decreasing your body fat.  Increasing your muscle strength.  Reducing stress and tension.  Improving your overall health. In order to become healthy and stay healthy, it is recommended that you do moderate-intensity and vigorous-intensity exercise. You can tell that you are exercising at a moderate intensity if you have a higher heart rate and faster breathing, but you are still able to hold a conversation. You can tell that you are exercising at a vigorous intensity if you are breathing much harder and faster and cannot hold a conversation while exercising. HOW OFTEN SHOULD I EXERCISE? Choose an activity that you enjoy and set realistic goals. Your health care provider can help you to make an activity plan that works for you. Exercise regularly as directed by your health care provider. This may include:   Doing resistance training twice each week, such as:  Push-ups.  Sit-ups.  Lifting weights.  Using resistance bands.  Doing a given intensity of exercise for a given amount of time. Choose from these options:  150 minutes of moderate-intensity exercise every week.  75 minutes of vigorous-intensity exercise every week.  A mix of moderate-intensity and vigorous-intensity exercise every week. Children, pregnant women, people who are out of shape, people who are overweight, and older adults may need to consult a health care provider for individual recommendations. If you have any sort of medical condition, be sure to consult your health care provider before starting a new exercise program.  WHAT ARE SOME EXERCISE IDEAS? Some  moderate-intensity exercise ideas include:   Walking at a rate of 1 mile in 15 minutes.  Biking.  Hiking.  Golfing.  Dancing. Some vigorous-intensity exercise ideas include:   Walking at a rate of at least 4.5 miles per hour.  Jogging or running at a rate of 5 miles per hour.  Biking at a rate of at least 10 miles per hour.  Lap swimming.  Roller-skating or in-line skating.  Cross-country skiing.  Vigorous competitive sports, such as football, basketball, and soccer.  Jumping rope.  Aerobic dancing. WHAT ARE SOME EVERYDAY ACTIVITIES THAT CAN HELP ME TO GET EXERCISE?  Yard work, such as:  Psychologist, educational.  Raking and bagging leaves.  Washing and waxing your car.  Pushing a stroller.  Shoveling snow.  Gardening.  Washing windows or floors. HOW CAN I BE MORE ACTIVE IN MY DAY-TO-DAY ACTIVITIES?  Use the stairs instead of the elevator.  Take a walk during your lunch break.  If you drive, park your car farther away from work or school.  If you take public transportation, get off one stop early and walk the rest of the way.  Make all of your phone calls while standing up and walking around.  Get up, stretch, and walk around every 30 minutes throughout the day. WHAT GUIDELINES SHOULD I FOLLOW WHILE EXERCISING?  Do not exercise so much that you hurt yourself, feel dizzy, or get very short of breath.  Consult your health care provider before starting a new exercise program.  Wear comfortable clothes and shoes with good support.  Drink plenty of water while you exercise to prevent dehydration or heat stroke. Body  water is lost during exercise and must be replaced.  Work out until you breathe faster and your heart beats faster.   This information is not intended to replace advice given to you by your health care provider. Make sure you discuss any questions you have with your health care provider.   Document Released: 07/11/2010 Document Revised:  06/29/2014 Document Reviewed: 11/09/2013 Elsevier Interactive Patient Education 2016 [place] Maintenance, Female Adopting a healthy lifestyle and getting preventive care can go a long way to promote health and wellness. Talk with your health care provider about what schedule of regular examinations is right for you. This is a good chance for you to check in with your provider about disease prevention and staying healthy. In between checkups, there are plenty of things you can do on your own. Experts have done a lot of research about which lifestyle changes and preventive measures are most likely to keep you healthy. Ask your health care provider for more information. WEIGHT AND DIET  Eat a healthy diet  Be sure to include plenty of vegetables, fruits, low-fat dairy products, and lean protein.  Do not eat a lot of foods high in solid fats, added sugars, or salt.  Get regular exercise. This is one of the most important things you can do for your health.  Most adults should exercise for at least 150 minutes each week. The exercise should increase your heart rate and make you sweat (moderate-intensity exercise).  Most adults should also do strengthening exercises at least twice a week. This is in addition to the moderate-intensity exercise.  Maintain a healthy weight  Body mass index (BMI) is a measurement that can be used to identify possible weight problems. It estimates body fat based on height and weight. Your health care provider can help determine your BMI and help you achieve or maintain a healthy weight.  For females 70 years of age and older:   A BMI below 18.5 is considered underweight.  A BMI of 18.5 to 24.9 is normal.  A BMI of 25 to 29.9 is considered overweight.  A BMI of 30 and above is considered obese.  Watch levels of cholesterol and blood lipids  You should start having your blood tested for lipids and cholesterol at 55 years of age, then have this test  every 5 years.  You may need to have your cholesterol levels checked more often if:  Your lipid or cholesterol levels are high.  You are older than 55 years of age.  You are at high risk for heart disease.  CANCER SCREENING   Lung Cancer  Lung cancer screening is recommended for adults 40-9 years old who are at high risk for lung cancer because of a history of smoking.  A yearly low-dose CT scan of the lungs is recommended for people who:  Currently smoke.  Have quit within the past 15 years.  Have at least a 30-pack-year history of smoking. A pack year is smoking an average of one pack of cigarettes a day for 1 year.  Yearly screening should continue until it has been 15 years since you quit.  Yearly screening should stop if you develop a health problem that would prevent you from having lung cancer treatment.  Breast Cancer  Practice breast self-awareness. This means understanding how your breasts normally appear and feel.  It also means doing regular breast self-exams. Let your health care provider know about any changes, no matter how small.  If you are  in your 65s or 30s, you should have a clinical breast exam (CBE) by a health care provider every 1-3 years as part of a regular health exam.  If you are 50 or older, have a CBE every year. Also consider having a breast X-ray (mammogram) every year.  If you have a family history of breast cancer, talk to your health care provider about genetic screening.  If you are at high risk for breast cancer, talk to your health care provider about having an MRI and a mammogram every year.  Breast cancer gene (BRCA) assessment is recommended for women who have family members with BRCA-related cancers. BRCA-related cancers include:  Breast.  Ovarian.  Tubal.  Peritoneal cancers.  Results of the assessment will determine the need for genetic counseling and BRCA1 and BRCA2 testing. Cervical Cancer Your health care provider  may recommend that you be screened regularly for cancer of the pelvic organs (ovaries, uterus, and vagina). This screening involves a pelvic examination, including checking for microscopic changes to the surface of your cervix (Pap test). You may be encouraged to have this screening done every 3 years, beginning at age 73.  For women ages 5-65, health care providers may recommend pelvic exams and Pap testing every 3 years, or they may recommend the Pap and pelvic exam, combined with testing for human papilloma virus (HPV), every 5 years. Some types of HPV increase your risk of cervical cancer. Testing for HPV may also be done on women of any age with unclear Pap test results.  Other health care providers may not recommend any screening for nonpregnant women who are considered low risk for pelvic cancer and who do not have symptoms. Ask your health care provider if a screening pelvic exam is right for you.  If you have had past treatment for cervical cancer or a condition that could lead to cancer, you need Pap tests and screening for cancer for at least 20 years after your treatment. If Pap tests have been discontinued, your risk factors (such as having a new sexual partner) need to be reassessed to determine if screening should resume. Some women have medical problems that increase the chance of getting cervical cancer. In these cases, your health care provider may recommend more frequent screening and Pap tests. Colorectal Cancer  This type of cancer can be detected and often prevented.  Routine colorectal cancer screening usually begins at 55 years of age and continues through 55 years of age.  Your health care provider may recommend screening at an earlier age if you have risk factors for colon cancer.  Your health care provider may also recommend using home test kits to check for hidden blood in the stool.  A small camera at the end of a tube can be used to examine your colon directly  (sigmoidoscopy or colonoscopy). This is done to check for the earliest forms of colorectal cancer.  Routine screening usually begins at age 75.  Direct examination of the colon should be repeated every 5-10 years through 55 years of age. However, you may need to be screened more often if early forms of precancerous polyps or small growths are found. Skin Cancer  Check your skin from head to toe regularly.  Tell your health care provider about any new moles or changes in moles, especially if there is a change in a mole's shape or color.  Also tell your health care provider if you have a mole that is larger than the size of a  pencil eraser.  Always use sunscreen. Apply sunscreen liberally and repeatedly throughout the day.  Protect yourself by wearing long sleeves, pants, a wide-brimmed hat, and sunglasses whenever you are outside. HEART DISEASE, DIABETES, AND HIGH BLOOD PRESSURE   High blood pressure causes heart disease and increases the risk of stroke. High blood pressure is more likely to develop in:  People who have blood pressure in the high end of the normal range (130-139/85-89 mm Hg).  People who are overweight or obese.  People who are African American.  If you are 54-65 years of age, have your blood pressure checked every 3-5 years. If you are 89 years of age or older, have your blood pressure checked every year. You should have your blood pressure measured twice--once when you are at a hospital or clinic, and once when you are not at a hospital or clinic. Record the average of the two measurements. To check your blood pressure when you are not at a hospital or clinic, you can use:  An automated blood pressure machine at a pharmacy.  A home blood pressure monitor.  If you are between 63 years and 84 years old, ask your health care provider if you should take aspirin to prevent strokes.  Have regular diabetes screenings. This involves taking a blood sample to check your  fasting blood sugar level.  If you are at a normal weight and have a low risk for diabetes, have this test once every three years after 55 years of age.  If you are overweight and have a high risk for diabetes, consider being tested at a younger age or more often. PREVENTING INFECTION  Hepatitis B  If you have a higher risk for hepatitis B, you should be screened for this virus. You are considered at high risk for hepatitis B if:  You were born in a country where hepatitis B is common. Ask your health care provider which countries are considered high risk.  Your parents were born in a high-risk country, and you have not been immunized against hepatitis B (hepatitis B vaccine).  You have HIV or AIDS.  You use needles to inject street drugs.  You live with someone who has hepatitis B.  You have had sex with someone who has hepatitis B.  You get hemodialysis treatment.  You take certain medicines for conditions, including cancer, organ transplantation, and autoimmune conditions. Hepatitis C  Blood testing is recommended for:  Everyone born from [birth year] through 1966.  Anyone with known risk factors for hepatitis C. Sexually transmitted infections (STIs)  You should be screened for sexually transmitted infections (STIs) including gonorrhea and chlamydia if:  You are sexually active and are younger than 55 years of age.  You are older than 55 years of age and your health care provider tells you that you are at risk for this type of infection.  Your sexual activity has changed since you were last screened and you are at an increased risk for chlamydia or gonorrhea. Ask your health care provider if you are at risk.  If you do not have HIV, but are at risk, it may be recommended that you take a prescription medicine daily to prevent HIV infection. This is called pre-exposure prophylaxis (PrEP). You are considered at risk if:  You are sexually active and do not regularly use condoms or  know the HIV status of your partner(s).  You take drugs by injection.  You are sexually active with a partner who has HIV. Talk  with your health care provider about whether you are at high risk of being infected with HIV. If you choose to begin PrEP, you should first be tested for HIV. You should then be tested every 3 months for as long as you are taking PrEP.  PREGNANCY   If you are premenopausal and you may become pregnant, ask your health care provider about preconception counseling.  If you may become pregnant, take 400 to 800 micrograms (mcg) of folic acid every day.  If you want to prevent pregnancy, talk to your health care provider about birth control (contraception). OSTEOPOROSIS AND MENOPAUSE   Osteoporosis is a disease in which the bones lose minerals and strength with aging. This can result in serious bone fractures. Your risk for osteoporosis can be identified using a bone density scan.  If you are 43 years of age or older, or if you are at risk for osteoporosis and fractures, ask your health care provider if you should be screened.  Ask your health care provider whether you should take a calcium or vitamin D supplement to lower your risk for osteoporosis.  Menopause may have certain physical symptoms and risks.  Hormone replacement therapy may reduce some of these symptoms and risks. Talk to your health care provider about whether hormone replacement therapy is right for you.  HOME CARE INSTRUCTIONS   Schedule regular health, dental, and eye exams.  Stay current with your immunizations.   Do not use any tobacco products including cigarettes, chewing tobacco, or electronic cigarettes.  If you are pregnant, do not drink alcohol.  If you are breastfeeding, limit how much and how often you drink alcohol.  Limit alcohol intake to no more than 1 drink per day for nonpregnant women. One drink equals 12 ounces of beer, 5 ounces of wine, or 1 ounces of hard liquor.  Do  not use street drugs.  Do not share needles.  Ask your health care provider for help if you need support or information about quitting drugs.  Tell your health care provider if you often feel depressed.  Tell your health care provider if you have ever been abused or do not feel safe at home.   This information is not intended to replace advice given to you by your health care provider. Make sure you discuss any questions you have with your health care provider.   Document Released: 12/22/2010 Document Revised: 06/29/2014 Document Reviewed: 05/10/2013 Elsevier Interactive Patient Education [employer].         Why follow it? Research shows. . Those who follow the Mediterranean diet have a reduced risk of heart disease  . The diet is associated with a reduced incidence of Parkinson's and Alzheimer's diseases . People following the diet may have longer life expectancies and lower rates of chronic diseases  . The Dietary Guidelines for Americans recommends the Mediterranean diet as an eating plan to promote health and prevent disease  What Is the Mediterranean Diet?  . Healthy eating plan based on typical foods and recipes of Mediterranean-style cooking . The diet is primarily a plant based diet; these foods should make up a majority of meals   Starches - Plant based foods should make up a majority of meals - They are an important sources of vitamins, minerals, energy, antioxidants, and fiber - Choose whole grains, foods high in fiber and minimally processed items  - Typical grain sources include wheat, oats, barley, corn, brown rice, bulgar, farro, millet, polenta, couscous  - Various  types of beans include chickpeas, lentils, fava beans, black beans, white beans   Fruits  Veggies - Large quantities of antioxidant rich fruits & veggies; 6 or more servings  - Vegetables can be eaten raw or lightly drizzled with oil and cooked  - Vegetables common to the traditional  Mediterranean Diet include: artichokes, arugula, beets, broccoli, brussel sprouts, cabbage, carrots, celery, collard greens, cucumbers, eggplant, kale, leeks, lemons, lettuce, mushrooms, okra, onions, peas, peppers, potatoes, pumpkin, radishes, rutabaga, shallots, spinach, sweet potatoes, turnips, zucchini - Fruits common to the Mediterranean Diet include: apples, apricots, avocados, cherries, clementines, dates, figs, grapefruits, grapes, melons, nectarines, oranges, peaches, pears, pomegranates, strawberries, tangerines  Fats - Replace butter and margarine with healthy oils, such as olive oil, canola oil, and tahini  - Limit nuts to no more than a handful a day  - Nuts include walnuts, almonds, pecans, pistachios, pine nuts  - Limit or avoid candied, honey roasted or heavily salted nuts - Olives are central to the [employer] - can be eaten whole or used in a variety of dishes   Meats Protein - Limiting red meat: no more than a few times a month - When eating red meat: choose lean cuts and keep the portion to the size of deck of cards - Eggs: approx. 0 to 4 times a week  - Fish and lean poultry: at least 2 a week  - Healthy protein sources include, chicken, [place], lean beef, lamb - Increase intake of seafood such as tuna, salmon, trout, mackerel, shrimp, scallops - Avoid or limit high fat processed meats such as sausage and bacon  Dairy - Include moderate amounts of low fat dairy products  - Focus on healthy dairy such as fat free yogurt, skim milk, low or reduced fat cheese - Limit dairy products higher in fat such as whole or 2% milk, cheese, ice cream  Alcohol - Moderate amounts of red wine is ok  - No more than 5 oz daily for women (all ages) and men older than age 58  - No more than 10 oz of wine daily for men younger than 60  Other - Limit sweets and other desserts  - Use herbs and spices instead of salt to flavor foods  - Herbs and spices common to the traditional Mediterranean  Diet include: basil, bay leaves, chives, cloves, cumin, fennel, garlic, lavender, marjoram, mint, oregano, parsley, pepper, rosemary, sage, savory, sumac, tarragon, thyme   It's not just a diet, it's a lifestyle:  . The Mediterranean diet includes lifestyle factors typical of those in the region  . Foods, drinks and meals are best eaten with others and savored . Daily physical activity is important for overall good health . This could be strenuous exercise like running and aerobics . This could also be more leisurely activities such as walking, housework, yard-work, or taking the stairs . Moderation is the key; a balanced and healthy diet accommodates most foods and drinks . Consider portion sizes and frequency of consumption of certain foods   Meal Ideas & Options:  . Breakfast:  o Whole wheat toast or whole wheat English muffins with peanut butter & hard boiled egg o Steel cut oats topped with apples & cinnamon and skim milk  o Fresh fruit: banana, strawberries, melon, berries, peaches  o Smoothies: strawberries, bananas, greek yogurt, peanut butter o Low fat greek yogurt with blueberries and granola  o Egg white omelet with spinach and mushrooms o Breakfast couscous: whole wheat couscous, apricots, skim milk,  cranberries  . Sandwiches:  o Hummus and grilled vegetables (peppers, zucchini, squash) on whole wheat bread   o Grilled chicken on whole wheat pita with lettuce, tomatoes, cucumbers or tzatziki  o Tuna salad on whole wheat bread: tuna salad made with greek yogurt, olives, red peppers, capers, green onions o Garlic rosemary lamb pita: lamb sauted with garlic, rosemary, salt & pepper; add lettuce, cucumber, greek yogurt to pita - flavor with lemon juice and black pepper  . Seafood:  o Mediterranean grilled salmon, seasoned with garlic, basil, parsley, lemon juice and black pepper o Shrimp, lemon, and spinach whole-grain pasta salad made with low fat greek yogurt  o Seared scallops  with lemon orzo  o Seared tuna steaks seasoned salt, pepper, coriander topped with tomato mixture of olives, tomatoes, olive oil, minced garlic, parsley, green onions and cappers  . Meats:  o Herbed greek chicken salad with kalamata olives, cucumber, feta  o Red bell peppers stuffed with spinach, bulgur, lean ground beef (or lentils) & topped with feta   o Kebabs: skewers of chicken, tomatoes, onions, zucchini, squash  o [place] burgers: made with red onions, mint, dill, lemon juice, feta cheese topped with roasted red peppers . Vegetarian o Cucumber salad: cucumbers, artichoke hearts, celery, red onion, feta cheese, tossed in olive oil & lemon juice  o Hummus and whole grain pita points with a greek salad (lettuce, tomato, feta, olives, cucumbers, red onion) o Lentil soup with celery, carrots made with vegetable broth, garlic, salt and pepper  o Tabouli salad: parsley, bulgur, mint, scallions, cucumbers, tomato, radishes, lemon juice, olive oil, salt and pepper.

## 2015-04-19 NOTE — Progress Notes (Signed)
Chief Complaint  Patient presents with  . Annual Exam    HPI: Patient  Laura Smith  54 y.o. comes in today for [place] visit  Works 60 hours  Per week.   VF not a lot of time  To execise   Father ill.  Helping her mom out also  bp good  HAS  Stable  Sees dr lewit asks if we can take over meds and management  On nsaid triptan   Verapamil ..cost considtionation  Health Maintenance  Topic Date Due  . Hepatitis C Screening  March 09, 1961  . HIV Screening  01/30/1975  . INFLUENZA VACCINE  01/21/2015  . TETANUS/TDAP  10/21/2015  . PAP SMEAR  04/28/2016  . MAMMOGRAM  05/25/2016  . COLONOSCOPY  04/28/2019   Health Maintenance Review LIFESTYLE:  Exercise:   Not a lot takes the stair . Tobacco/ETS:  no Alcohol: no Sugar beverages: sodas weekends  .  Sleep:  8-9  Drug use: no  ROS:  GEN/ HEENT: No fever, significant weight changes sweats  Change in headaches vision problems hearing changes, CV/ PULM; No chest pain shortness of breath cough, syncope,edema  change in exercise tolerance. GI /GU: No adominal pain, vomiting, change in bowel habits. No blood in the stool. No significant GU symptoms. SKIN/HEME: ,no acute skin rashes suspicious lesions or bleeding. No lymphadenopathy, nodules, masses.  NEURO/ PSYCH:  No neurologic signs such as weakness numbness. No depression anxiety. IMM/ Allergy: No unusual infections.  Allergy .   REST of 12 system review negative except as per HPI   Past Medical History  Diagnosis Date  . Migraine   . Hemorrhoids   . Hypertension   . Endometriosis     surgery laser 2001  . Hx of basal cell carcinoma   . Allergy     Past Surgical History  Procedure Laterality Date  . Appendectomy  1992    AT C/S  . Cesarean section  1992    APPENDECTOMY SAME TIME  . Pelvic laparoscopy  08/1999    AND HYSTEROSCOPY--ENDOMETRIOSIS  . Hysteroscopy  08/1999    AT TIME OF LAPAROSCOPY    Family History  Problem Relation Age of Onset  .  Cancer Mother 12    COLON  . Breast cancer Mother     Age 80  . Colon cancer Mother 1  . Heart disease Father   . Hypertension Paternal Grandmother   . Heart disease Paternal Grandmother   . Stroke Paternal Grandmother   . Cancer Paternal Grandmother     Lung  . Hypertension Paternal Grandfather   . Heart disease Paternal Grandfather   . Cancer Paternal Grandfather     Prostate  . Breast cancer Cousin     Age 52's  . Stroke Maternal Grandfather     Social History   Social History  . Marital Status: Married    Spouse Name: N/A  . Number of Children: N/A  . Years of Education: N/A   Social History Main Topics  . Smoking status: Never Smoker   . Smokeless tobacco: Never Used  . Alcohol Use: No  . Drug Use: No  . Sexual Activity: No   Other Topics Concern  . None   Social History Narrative   h h of 2 Pet cat   Daughter /  Married    g1 p1   Works [employer]     Outpatient Prescriptions Prior to  Visit  Medication Sig Dispense Refill  . Ascorbic Acid (VITAMIN C) 500 MG CAPS Take 1,000 mg by mouth every morning.     Marland Kitchen b complex vitamins tablet Take 2 tablets by mouth daily.     Marland Kitchen CALCIUM-MAGNESIUM PO Take 3 tablets by mouth daily.     . cetirizine (ZYRTEC) 10 MG tablet Take 10 mg by mouth daily.    . hydrochlorothiazide (HYDRODIURIL) 25 MG tablet TAKE 1 TABLET DAILY 90 tablet 3  . HYDROcodone-acetaminophen (NORCO) 7.5-325 MG per tablet Take 1-2 tablets by mouth every 4 (four) hours as needed for pain (as needed for migraine).     Marland Kitchen HYDROmorphone (DILAUDID) 4 MG tablet Take 4-8 mg by mouth every 4 (four) hours as needed for pain (rescure for migraine).     . meclizine (ANTIVERT) 25 MG tablet TAKE 2 TABLET (50 MG TOTAL) BY MOUTH 2 (TWO) TIMES DAILY AS NEEDED (FOR TRAVEL). 30 tablet 0  . montelukast (SINGULAIR) 10 MG tablet Take 1 tablet (10 mg total) by mouth daily as needed (for seasonal allergies, uses as needed). 90 tablet 1  .  Multiple Vitamins-Iron (MULTIVITAMIN/IRON) TABS Take 1 tablet by mouth daily.     Marland Kitchen nystatin (MYCOSTATIN/NYSTOP) 100000 UNIT/GM POWD Apply 2 -3  Time per day to intertrigo flare. 60 g 3  . sulindac (CLINORIL) 200 MG tablet Take 200 mg by mouth 2 (two) times daily as needed (headache).    . SUMAtriptan (IMITREX) 6 MG/0.5ML SOLN injection Inject 6 mg into the skin every 2 (two) hours as needed for migraine. F    . verapamil (CALAN-SR) 180 MG CR tablet TAKE 1 TABLET AT BEDTIME 90 tablet 3  . ZOLMitriptan (ZOMIG NA) Place 1 spray into the nose daily as needed (for migraines).      No facility-administered medications prior to visit.     EXAM:  BP 110/80 mmHg  Pulse 76  Temp(Src) 98.9 F (37.2 C) (Oral)  Ht 5' 7.5" (1.715 m)  Wt 225 lb (102.059 kg)  BMI 34.70 kg/m2  SpO2 98%  Body mass index is 34.7 kg/(m^2).  Physical Exam: Vital signs reviewed GMW:NUUV is a well-developed well-nourished alert cooperative    who appearsr stated age in no acute distress.  HEENT: normocephalic atraumatic , Eyes: PERRL EOM's full, conjunctiva clear, Nares: paten,t no deformity discharge or tenderness., Ears: no deformity EAC's clear TMs with normal landmarks. Mouth: clear OP, no lesions, edema.  Moist mucous membranes. Dentition in adequate repair. NECK: supple without masses, thyromegaly or bruits. CHEST/PULM:  Clear to auscultation and percussion breath sounds equal no wheeze , rales or rhonchi. No chest wall deformities or tenderness. CV: PMI is nondisplaced, S1 S2 no gallops, murmurs, rubs. Peripheral pulses are full without delay.No JVD .  ABDOMEN: Bowel sounds normal nontender  No guard or rebound, no hepato splenomegal no CVA tenderness.  No hernia. Extremtities:  No clubbing cyanosis or edema, no acute joint swelling or redness no focal atrophy NEURO:  Oriented x3, cranial nerves 3-12 appear to be intact, no obvious focal weakness,gait within normal limits no abnormal reflexes or asymmetrical SKIN:  No acute rashes normal turgor, color, no bruising or petechiae. PSYCH: Oriented, good eye contact, no obvious depression anxiety, cognition and judgment appear normal. LN: no cervical axillary inguinal adenopathy  Lab Results  Component Value Date   WBC 7.1 04/12/2015   HGB 14.3 04/12/2015   HCT 42.8 04/12/2015   PLT 317.0 04/12/2015   GLUCOSE 101* 04/12/2015   CHOL 189 04/12/2015  TRIG 182.0* 04/12/2015   HDL 51.00 04/12/2015   LDLDIRECT 133.8 04/07/2013   LDLCALC 102* 04/12/2015   ALT 22 04/12/2015   AST 23 04/12/2015   NA 139 04/12/2015   K 3.8 04/12/2015   CL 102 04/12/2015   CREATININE 0.90 04/12/2015   BUN 13 04/12/2015   CO2 28 04/12/2015   TSH 3.75 04/12/2015   BP Readings from Last 3 Encounters:  04/19/15 110/80  02/01/15 124/84  12/21/14 134/90   Wt Readings from Last 3 Encounters:  04/19/15 225 lb (102.059 kg)  02/01/15 226 lb (102.513 kg)  12/21/14 221 lb 4.8 oz (100.381 kg)    ASSESSMENT AND PLAN:  Discussed the following assessment and plan:  Visit for preventive health examination  Essential hypertension  Fasting hyperglycemia  History of migraine headaches - under managment dr lewit but stable denied  insuance cause of seeing specialist  Get records last 2 years to review about headaches  And will decide on taking over meds   Uncertain how often uses rescue at thi times   Patient Care Team: Burnis Medin, MD as PCP - General (Internal Medicine) Anastasio Auerbach, MD as Attending Physician (Obstetrics and Gynecology) Michel Santee, MD (Neurology) Jarome Matin, MD as Consulting Physician (Dermatology) Thalia Bloodgood, OD as Referring Physician (Optometry) Patient Instructions   Continue lifestyle intervention healthy eating and exercise .  Exercise can help .  Mediterranean diet  Is heart  healthy .    Exercising to Stay Healthy Exercising regularly is important. It has many health benefits, such as:  Improving your overall fitness,  flexibility, and endurance.  Increasing your bone density.  Helping with weight control.  Decreasing your body fat.  Increasing your muscle strength.  Reducing stress and tension.  Improving your overall health. In order to become healthy and stay healthy, it is recommended that you do moderate-intensity and vigorous-intensity exercise. You can tell that you are exercising at a moderate intensity if you have a higher heart rate and faster breathing, but you are still able to hold a conversation. You can tell that you are exercising at a vigorous intensity if you are breathing much harder and faster and cannot hold a conversation while exercising. HOW OFTEN SHOULD I EXERCISE? Choose an activity that you enjoy and set realistic goals. Your health care provider can help you to make an activity plan that works for you. Exercise regularly as directed by your health care provider. This may include:   Doing resistance training twice each week, such as:  Push-ups.  Sit-ups.  Lifting weights.  Using resistance bands.  Doing a given intensity of exercise for a given amount of time. Choose from these options:  150 minutes of moderate-intensity exercise every week.  75 minutes of vigorous-intensity exercise every week.  A mix of moderate-intensity and vigorous-intensity exercise every week. Children, pregnant women, people who are out of shape, people who are overweight, and older adults may need to consult a health care provider for individual recommendations. If you have any sort of medical condition, be sure to consult your health care provider before starting a new exercise program.  WHAT ARE SOME EXERCISE IDEAS? Some moderate-intensity exercise ideas include:   Walking at a rate of 1 mile in 15 minutes.  Biking.  Hiking.  Golfing.  Dancing. Some vigorous-intensity exercise ideas include:   Walking at a rate of at least 4.5 miles per hour.  Jogging or running at a rate of 5  miles per hour.  Biking  at a rate of at least 10 miles per hour.  Lap swimming.  Roller-skating or in-line skating.  Cross-country skiing.  Vigorous competitive sports, such as football, basketball, and soccer.  Jumping rope.  Aerobic dancing. WHAT ARE SOME EVERYDAY ACTIVITIES THAT CAN HELP ME TO GET EXERCISE?  Yard work, such as:  Psychologist, educational.  Raking and bagging leaves.  Washing and waxing your car.  Pushing a stroller.  Shoveling snow.  Gardening.  Washing windows or floors. HOW CAN I BE MORE ACTIVE IN MY DAY-TO-DAY ACTIVITIES?  Use the stairs instead of the elevator.  Take a walk during your lunch break.  If you drive, park your car farther away from work or school.  If you take public transportation, get off one stop early and walk the rest of the way.  Make all of your phone calls while standing up and walking around.  Get up, stretch, and walk around every 30 minutes throughout the day. WHAT GUIDELINES SHOULD I FOLLOW WHILE EXERCISING?  Do not exercise so much that you hurt yourself, feel dizzy, or get very short of breath.  Consult your health care provider before starting a new exercise program.  Wear comfortable clothes and shoes with good support.  Drink plenty of water while you exercise to prevent dehydration or heat stroke. Body water is lost during exercise and must be replaced.  Work out until you breathe faster and your heart beats faster.   This information is not intended to replace advice given to you by your health care provider. Make sure you discuss any questions you have with your health care provider.   Document Released: 07/11/2010 Document Revised: 06/29/2014 Document Reviewed: 11/09/2013 Elsevier Interactive Patient Education 2016 [place] Maintenance, Female Adopting a healthy lifestyle and getting preventive care can go a long way to promote health and wellness. Talk with your health care provider about  what schedule of regular examinations is right for you. This is a good chance for you to check in with your provider about disease prevention and staying healthy. In between checkups, there are plenty of things you can do on your own. Experts have done a lot of research about which lifestyle changes and preventive measures are most likely to keep you healthy. Ask your health care provider for more information. WEIGHT AND DIET  Eat a healthy diet  Be sure to include plenty of vegetables, fruits, low-fat dairy products, and lean protein.  Do not eat a lot of foods high in solid fats, added sugars, or salt.  Get regular exercise. This is one of the most important things you can do for your health.  Most adults should exercise for at least 150 minutes each week. The exercise should increase your heart rate and make you sweat (moderate-intensity exercise).  Most adults should also do strengthening exercises at least twice a week. This is in addition to the moderate-intensity exercise.  Maintain a healthy weight  Body mass index (BMI) is a measurement that can be used to identify possible weight problems. It estimates body fat based on height and weight. Your health care provider can help determine your BMI and help you achieve or maintain a healthy weight.  For females 55 years of age and older:   A BMI below 18.5 is considered underweight.  A BMI of 18.5 to 24.9 is normal.  A BMI of 25 to 29.9 is considered overweight.  A BMI of 30 and above is considered obese.  Watch levels  of cholesterol and blood lipids  You should start having your blood tested for lipids and cholesterol at 55 years of age, then have this test every 5 years.  You may need to have your cholesterol levels checked more often if:  Your lipid or cholesterol levels are high.  You are older than 55 years of age.  You are at high risk for heart disease.  CANCER SCREENING   Lung Cancer  Lung cancer screening is  recommended for adults 74-77 years old who are at high risk for lung cancer because of a history of smoking.  A yearly low-dose CT scan of the lungs is recommended for people who:  Currently smoke.  Have quit within the past 15 years.  Have at least a 30-pack-year history of smoking. A pack year is smoking an average of one pack of cigarettes a day for 1 year.  Yearly screening should continue until it has been 15 years since you quit.  Yearly screening should stop if you develop a health problem that would prevent you from having lung cancer treatment.  Breast Cancer  Practice breast self-awareness. This means understanding how your breasts normally appear and feel.  It also means doing regular breast self-exams. Let your health care provider know about any changes, no matter how small.  If you are in your 20s or 30s, you should have a clinical breast exam (CBE) by a health care provider every 1-3 years as part of a regular health exam.  If you are 53 or older, have a CBE every year. Also consider having a breast X-ray (mammogram) every year.  If you have a family history of breast cancer, talk to your health care provider about genetic screening.  If you are at high risk for breast cancer, talk to your health care provider about having an MRI and a mammogram every year.  Breast cancer gene (BRCA) assessment is recommended for women who have family members with BRCA-related cancers. BRCA-related cancers include:  Breast.  Ovarian.  Tubal.  Peritoneal cancers.  Results of the assessment will determine the need for genetic counseling and BRCA1 and BRCA2 testing. Cervical Cancer Your health care provider may recommend that you be screened regularly for cancer of the pelvic organs (ovaries, uterus, and vagina). This screening involves a pelvic examination, including checking for microscopic changes to the surface of your cervix (Pap test). You may be encouraged to have this  screening done every 3 years, beginning at age 20.  For women ages 1-65, health care providers may recommend pelvic exams and Pap testing every 3 years, or they may recommend the Pap and pelvic exam, combined with testing for human papilloma virus (HPV), every 5 years. Some types of HPV increase your risk of cervical cancer. Testing for HPV may also be done on women of any age with unclear Pap test results.  Other health care providers may not recommend any screening for nonpregnant women who are considered low risk for pelvic cancer and who do not have symptoms. Ask your health care provider if a screening pelvic exam is right for you.  If you have had past treatment for cervical cancer or a condition that could lead to cancer, you need Pap tests and screening for cancer for at least 20 years after your treatment. If Pap tests have been discontinued, your risk factors (such as having a new sexual partner) need to be reassessed to determine if screening should resume. Some women have medical problems that increase the  chance of getting cervical cancer. In these cases, your health care provider may recommend more frequent screening and Pap tests. Colorectal Cancer  This type of cancer can be detected and often prevented.  Routine colorectal cancer screening usually begins at 54 years of age and continues through 55 years of age.  Your health care provider may recommend screening at an earlier age if you have risk factors for colon cancer.  Your health care provider may also recommend using home test kits to check for hidden blood in the stool.  A small camera at the end of a tube can be used to examine your colon directly (sigmoidoscopy or colonoscopy). This is done to check for the earliest forms of colorectal cancer.  Routine screening usually begins at age 55.  Direct examination of the colon should be repeated every 5-10 years through 55 years of age. However, you may need to be screened  more often if early forms of precancerous polyps or small growths are found. Skin Cancer  Check your skin from head to toe regularly.  Tell your health care provider about any new moles or changes in moles, especially if there is a change in a mole's shape or color.  Also tell your health care provider if you have a mole that is larger than the size of a pencil eraser.  Always use sunscreen. Apply sunscreen liberally and repeatedly throughout the day.  Protect yourself by wearing long sleeves, pants, a wide-brimmed hat, and sunglasses whenever you are outside. HEART DISEASE, DIABETES, AND HIGH BLOOD PRESSURE   High blood pressure causes heart disease and increases the risk of stroke. High blood pressure is more likely to develop in:  People who have blood pressure in the high end of the normal range (130-139/85-89 mm Hg).  People who are overweight or obese.  People who are African American.  If you are 63-65 years of age, have your blood pressure checked every 3-5 years. If you are 63 years of age or older, have your blood pressure checked every year. You should have your blood pressure measured twice--once when you are at a hospital or clinic, and once when you are not at a hospital or clinic. Record the average of the two measurements. To check your blood pressure when you are not at a hospital or clinic, you can use:  An automated blood pressure machine at a pharmacy.  A home blood pressure monitor.  If you are between 64 years and 68 years old, ask your health care provider if you should take aspirin to prevent strokes.  Have regular diabetes screenings. This involves taking a blood sample to check your fasting blood sugar level.  If you are at a normal weight and have a low risk for diabetes, have this test once every three years after 55 years of age.  If you are overweight and have a high risk for diabetes, consider being tested at a younger age or more often. PREVENTING  INFECTION  Hepatitis B  If you have a higher risk for hepatitis B, you should be screened for this virus. You are considered at high risk for hepatitis B if:  You were born in a country where hepatitis B is common. Ask your health care provider which countries are considered high risk.  Your parents were born in a high-risk country, and you have not been immunized against hepatitis B (hepatitis B vaccine).  You have HIV or AIDS.  You use needles to inject street drugs.  You live with someone who has hepatitis B.  You have had sex with someone who has hepatitis B.  You get hemodialysis treatment.  You take certain medicines for conditions, including cancer, organ transplantation, and autoimmune conditions. Hepatitis C  Blood testing is recommended for:  Everyone born from [birth year] through 1966.  Anyone with known risk factors for hepatitis C. Sexually transmitted infections (STIs)  You should be screened for sexually transmitted infections (STIs) including gonorrhea and chlamydia if:  You are sexually active and are younger than 55 years of age.  You are older than 55 years of age and your health care provider tells you that you are at risk for this type of infection.  Your sexual activity has changed since you were last screened and you are at an increased risk for chlamydia or gonorrhea. Ask your health care provider if you are at risk.  If you do not have HIV, but are at risk, it may be recommended that you take a prescription medicine daily to prevent HIV infection. This is called pre-exposure prophylaxis (PrEP). You are considered at risk if:  You are sexually active and do not regularly use condoms or know the HIV status of your partner(s).  You take drugs by injection.  You are sexually active with a partner who has HIV. Talk with your health care provider about whether you are at high risk of being infected with HIV. If you choose to begin PrEP, you should first be  tested for HIV. You should then be tested every 3 months for as long as you are taking PrEP.  PREGNANCY   If you are premenopausal and you may become pregnant, ask your health care provider about preconception counseling.  If you may become pregnant, take 400 to 800 micrograms (mcg) of folic acid every day.  If you want to prevent pregnancy, talk to your health care provider about birth control (contraception). OSTEOPOROSIS AND MENOPAUSE   Osteoporosis is a disease in which the bones lose minerals and strength with aging. This can result in serious bone fractures. Your risk for osteoporosis can be identified using a bone density scan.  If you are 28 years of age or older, or if you are at risk for osteoporosis and fractures, ask your health care provider if you should be screened.  Ask your health care provider whether you should take a calcium or vitamin D supplement to lower your risk for osteoporosis.  Menopause may have certain physical symptoms and risks.  Hormone replacement therapy may reduce some of these symptoms and risks. Talk to your health care provider about whether hormone replacement therapy is right for you.  HOME CARE INSTRUCTIONS   Schedule regular health, dental, and eye exams.  Stay current with your immunizations.   Do not use any tobacco products including cigarettes, chewing tobacco, or electronic cigarettes.  If you are pregnant, do not drink alcohol.  If you are breastfeeding, limit how much and how often you drink alcohol.  Limit alcohol intake to no more than 1 drink per day for nonpregnant women. One drink equals 12 ounces of beer, 5 ounces of wine, or 1 ounces of hard liquor.  Do not use street drugs.  Do not share needles.  Ask your health care provider for help if you need support or information about quitting drugs.  Tell your health care provider if you often feel depressed.  Tell your health care provider if you have ever been abused or  do not feel  safe at home.   This information is not intended to replace advice given to you by your health care provider. Make sure you discuss any questions you have with your health care provider.   Document Released: 12/22/2010 Document Revised: 06/29/2014 Document Reviewed: 05/10/2013 Elsevier Interactive Patient Education [employer].         Why follow it? Research shows. . Those who follow the Mediterranean diet have a reduced risk of heart disease  . The diet is associated with a reduced incidence of Parkinson's and Alzheimer's diseases . People following the diet may have longer life expectancies and lower rates of chronic diseases  . The Dietary Guidelines for Americans recommends the Mediterranean diet as an eating plan to promote health and prevent disease  What Is the Mediterranean Diet?  . Healthy eating plan based on typical foods and recipes of Mediterranean-style cooking . The diet is primarily a plant based diet; these foods should make up a majority of meals   Starches - Plant based foods should make up a majority of meals - They are an important sources of vitamins, minerals, energy, antioxidants, and fiber - Choose whole grains, foods high in fiber and minimally processed items  - Typical grain sources include wheat, oats, barley, corn, brown rice, bulgar, farro, millet, polenta, couscous  - Various types of beans include chickpeas, lentils, fava beans, black beans, white beans   Fruits  Veggies - Large quantities of antioxidant rich fruits & veggies; 6 or more servings  - Vegetables can be eaten raw or lightly drizzled with oil and cooked  - Vegetables common to the traditional Mediterranean Diet include: artichokes, arugula, beets, broccoli, brussel sprouts, cabbage, carrots, celery, collard greens, cucumbers, eggplant, kale, leeks, lemons, lettuce, mushrooms, okra, onions, peas, peppers, potatoes, pumpkin, radishes, rutabaga, shallots, spinach, sweet  potatoes, turnips, zucchini - Fruits common to the Mediterranean Diet include: apples, apricots, avocados, cherries, clementines, dates, figs, grapefruits, grapes, melons, nectarines, oranges, peaches, pears, pomegranates, strawberries, tangerines  Fats - Replace butter and margarine with healthy oils, such as olive oil, canola oil, and tahini  - Limit nuts to no more than a handful a day  - Nuts include walnuts, almonds, pecans, pistachios, pine nuts  - Limit or avoid candied, honey roasted or heavily salted nuts - Olives are central to the [employer] - can be eaten whole or used in a variety of dishes   Meats Protein - Limiting red meat: no more than a few times a month - When eating red meat: choose lean cuts and keep the portion to the size of deck of cards - Eggs: approx. 0 to 4 times a week  - Fish and lean poultry: at least 2 a week  - Healthy protein sources include, chicken, [place], lean beef, lamb - Increase intake of seafood such as tuna, salmon, trout, mackerel, shrimp, scallops - Avoid or limit high fat processed meats such as sausage and bacon  Dairy - Include moderate amounts of low fat dairy products  - Focus on healthy dairy such as fat free yogurt, skim milk, low or reduced fat cheese - Limit dairy products higher in fat such as whole or 2% milk, cheese, ice cream  Alcohol - Moderate amounts of red wine is ok  - No more than 5 oz daily for women (all ages) and men older than age 26  - No more than 10 oz of wine daily for men younger than 82  Other - Limit sweets and other desserts  -  Use herbs and spices instead of salt to flavor foods  - Herbs and spices common to the traditional Mediterranean Diet include: basil, bay leaves, chives, cloves, cumin, fennel, garlic, lavender, marjoram, mint, oregano, parsley, pepper, rosemary, sage, savory, sumac, tarragon, thyme   It's not just a diet, it's a lifestyle:  . The Mediterranean diet includes lifestyle factors typical of  those in the region  . Foods, drinks and meals are best eaten with others and savored . Daily physical activity is important for overall good health . This could be strenuous exercise like running and aerobics . This could also be more leisurely activities such as walking, housework, yard-work, or taking the stairs . Moderation is the key; a balanced and healthy diet accommodates most foods and drinks . Consider portion sizes and frequency of consumption of certain foods   Meal Ideas & Options:  . Breakfast:  o Whole wheat toast or whole wheat English muffins with peanut butter & hard boiled egg o Steel cut oats topped with apples & cinnamon and skim milk  o Fresh fruit: banana, strawberries, melon, berries, peaches  o Smoothies: strawberries, bananas, greek yogurt, peanut butter o Low fat greek yogurt with blueberries and granola  o Egg white omelet with spinach and mushrooms o Breakfast couscous: whole wheat couscous, apricots, skim milk, cranberries  . Sandwiches:  o Hummus and grilled vegetables (peppers, zucchini, squash) on whole wheat bread   o Grilled chicken on whole wheat pita with lettuce, tomatoes, cucumbers or tzatziki  o Tuna salad on whole wheat bread: tuna salad made with greek yogurt, olives, red peppers, capers, green onions o Garlic rosemary lamb pita: lamb sauted with garlic, rosemary, salt & pepper; add lettuce, cucumber, greek yogurt to pita - flavor with lemon juice and black pepper  . Seafood:  o Mediterranean grilled salmon, seasoned with garlic, basil, parsley, lemon juice and black pepper o Shrimp, lemon, and spinach whole-grain pasta salad made with low fat greek yogurt  o Seared scallops with lemon orzo  o Seared tuna steaks seasoned salt, pepper, coriander topped with tomato mixture of olives, tomatoes, olive oil, minced garlic, parsley, green onions and cappers  . Meats:  o Herbed greek chicken salad with kalamata olives, cucumber, feta  o Red bell peppers  stuffed with spinach, bulgur, lean ground beef (or lentils) & topped with feta   o Kebabs: skewers of chicken, tomatoes, onions, zucchini, squash  o [place] burgers: made with red onions, mint, dill, lemon juice, feta cheese topped with roasted red peppers . Vegetarian o Cucumber salad: cucumbers, artichoke hearts, celery, red onion, feta cheese, tossed in olive oil & lemon juice  o Hummus and whole grain pita points with a greek salad (lettuce, tomato, feta, olives, cucumbers, red onion) o Lentil soup with celery, carrots made with vegetable broth, garlic, salt and pepper  o Tabouli salad: parsley, bulgur, mint, scallions, cucumbers, tomato, radishes, lemon juice, olive oil, salt and pepper.         [name]. Panosh M.D.

## 2015-04-19 NOTE — Progress Notes (Signed)
Pre visit review using our clinic review tool, if applicable. No additional management support is needed unless otherwise documented below in the visit note. 

## 2015-04-24 ENCOUNTER — Telehealth: Payer: Self-pay | Admitting: Obstetrics & Gynecology

## 2015-04-24 ENCOUNTER — Inpatient Hospital Stay (HOSPITAL_COMMUNITY)
Admission: AD | Admit: 2015-04-24 | Discharge: 2015-04-24 | Disposition: A | Discharge status: Home / Self Care | Payer: 59 | Source: Ambulatory Visit | Attending: Obstetrics & Gynecology | Admitting: Obstetrics & Gynecology

## 2015-04-24 ENCOUNTER — Encounter (HOSPITAL_COMMUNITY): Payer: Self-pay | Admitting: *Deleted

## 2015-04-24 DIAGNOSIS — N3001 Acute cystitis with hematuria: Secondary | ICD-10-CM

## 2015-04-24 DIAGNOSIS — N390 Urinary tract infection, site not specified: Secondary | ICD-10-CM | POA: Insufficient documentation

## 2015-04-24 DIAGNOSIS — Z85828 Personal history of other malignant neoplasm of skin: Secondary | ICD-10-CM | POA: Insufficient documentation

## 2015-04-24 DIAGNOSIS — I10 Essential (primary) hypertension: Secondary | ICD-10-CM | POA: Insufficient documentation

## 2015-04-24 DIAGNOSIS — R300 Dysuria: Secondary | ICD-10-CM | POA: Diagnosis present

## 2015-04-24 HISTORY — DX: Urinary tract infection, site not specified: N39.0

## 2015-04-24 LAB — CBC WITH DIFFERENTIAL/PLATELET
Basophils Absolute: 0 10*3/uL (ref 0.0–0.1)
Basophils Relative: 0 %
Eosinophils Absolute: 0.2 10*3/uL (ref 0.0–0.7)
Eosinophils Relative: 1 %
HCT: 41 % (ref 36.0–46.0)
Hemoglobin: 13.7 g/dL (ref 12.0–15.0)
Lymphocytes Relative: 13 %
Lymphs Abs: 1.9 10*3/uL (ref 0.7–4.0)
MCH: 28.6 pg (ref 26.0–34.0)
MCHC: 33.4 g/dL (ref 30.0–36.0)
MCV: 85.6 fL (ref 78.0–100.0)
Monocytes Absolute: 0.7 10*3/uL (ref 0.1–1.0)
Monocytes Relative: 5 %
Neutro Abs: 11.3 10*3/uL — ABNORMAL HIGH (ref 1.7–7.7)
Neutrophils Relative %: 81 %
Platelets: 281 10*3/uL (ref 150–400)
RBC: 4.79 MIL/uL (ref 3.87–5.11)
RDW: 14.3 % (ref 11.5–15.5)
WBC: 14 10*3/uL — ABNORMAL HIGH (ref 4.0–10.5)

## 2015-04-24 LAB — URINALYSIS, ROUTINE W REFLEX MICROSCOPIC
Glucose, UA: NEGATIVE mg/dL
Ketones, ur: 15 mg/dL — AB
Nitrite: POSITIVE — AB
Protein, ur: 100 mg/dL — AB
Specific Gravity, Urine: 1.02 (ref 1.005–1.030)
Urobilinogen, UA: 0.2 mg/dL (ref 0.0–1.0)
pH: 7 (ref 5.0–8.0)

## 2015-04-24 LAB — URINE MICROSCOPIC-ADD ON

## 2015-04-24 MED ORDER — NITROFURANTOIN MONOHYD MACRO 100 MG PO CAPS: 100.0000 mg | ORAL_CAPSULE | Freq: Two times a day (BID) | ORAL | Status: DC

## 2015-04-24 NOTE — MAU Provider Note (Signed)
History     CSN: [id number]  Arrival date and time: 04/24/15 0544   First Provider Initiated Contact with Patient 04/24/15 0606      No chief complaint on file.  HPI Ms. Laura Smith is a 54 y.o. G1P1001 who presents to MAU today with complaint of dysuria, frequent urination and hematuria. She states that she noted some mild dysuria and increased frequency last night, but noted increased severity of symptoms and obvious hematuria around 0300 today. She denies fever, flank pain or N/V. She states a history of frequent UTI and pyelonephritis. She denies history of kidney stones.   OB History    Gravida Para Term Preterm AB TAB SAB Ectopic Multiple Living   1 1 1       1       Past Medical History  Diagnosis Date  . Migraine   . Hemorrhoids   . Hypertension   . Endometriosis     surgery laser 2001  . Hx of basal cell carcinoma   . Allergy   . Urinary tract infection     Past Surgical History  Procedure Laterality Date  . Appendectomy  1992    AT [facility]  . Cesarean section  1992    APPENDECTOMY SAME TIME  . Pelvic laparoscopy  08/1999    AND HYSTEROSCOPY--ENDOMETRIOSIS  . Hysteroscopy  08/1999    AT TIME OF LAPAROSCOPY    Family History  Problem Relation Age of Onset  . Cancer Mother 11    COLON  . Breast cancer Mother     Age 67  . Colon cancer Mother 72  . Heart disease Father   . Hypertension Paternal Grandmother   . Heart disease Paternal Grandmother   . Stroke Paternal Grandmother   . Cancer Paternal Grandmother     Lung  . Hypertension Paternal Grandfather   . Heart disease Paternal Grandfather   . Cancer Paternal Grandfather     Prostate  . Breast cancer Cousin     Age 68's  . Stroke Maternal Grandfather     Social History  Substance Use Topics  . Smoking status: Never Smoker   . Smokeless tobacco: Never Used  . Alcohol Use: No    Allergies:  Allergies  Allergen Reactions  . Etodolac Hives    Possible anaphylaxis  With vomiting and  throat feeling tight  ED visit 8 14   . Latex Hives, Rash and Other (See Comments)    Causes blisters  . Other Hives, Rash and Other (See Comments)    Gel used for ultrasound-causes blisters also   . Adhesive [Tape] Itching and Rash    Prescriptions prior to admission  Medication Sig Dispense Refill Last Dose  . Ascorbic Acid (VITAMIN C) 500 MG CAPS Take 1,000 mg by mouth every morning.    04/23/2015 at Unknown time  . b complex vitamins tablet Take 2 tablets by mouth daily.    04/23/2015 at Unknown time  . CALCIUM-MAGNESIUM PO Take 3 tablets by mouth daily.    04/23/2015 at Unknown time  . cetirizine (ZYRTEC) 10 MG tablet Take 10 mg by mouth daily.   04/23/2015 at Unknown time  . hydrochlorothiazide (HYDRODIURIL) 25 MG tablet TAKE 1 TABLET DAILY 90 tablet 3 04/23/2015 at Unknown time  . meclizine (ANTIVERT) 25 MG tablet TAKE 2 TABLET (50 MG TOTAL) BY MOUTH 2 (TWO) TIMES DAILY AS NEEDED (FOR TRAVEL). 30 tablet 0 Past Month at Unknown time  . Multiple Vitamins-Iron (MULTIVITAMIN/IRON) TABS Take 1  tablet by mouth daily.    04/23/2015 at Unknown time  . verapamil (CALAN-SR) 180 MG CR tablet TAKE 1 TABLET AT BEDTIME 90 tablet 3 04/23/2015 at Unknown time  . HYDROcodone-acetaminophen (NORCO) 7.5-325 MG per tablet Take 1-2 tablets by mouth every 4 (four) hours as needed for pain (as needed for migraine).    More than a month at Unknown time  . HYDROmorphone (DILAUDID) 4 MG tablet Take 4-8 mg by mouth every 4 (four) hours as needed for pain (rescure for migraine).    More than a month at Unknown time  . montelukast (SINGULAIR) 10 MG tablet Take 1 tablet (10 mg total) by mouth daily as needed (for seasonal allergies, uses as needed). 90 tablet 1 Unknown at Unknown time  . nystatin (MYCOSTATIN/NYSTOP) 100000 UNIT/GM POWD Apply 2 -3  Time per day to intertrigo flare. 60 g 3 Taking  . sulindac (CLINORIL) 200 MG tablet Take 200 mg by mouth 2 (two) times daily as needed (headache).   Unknown at Unknown time  .  SUMAtriptan (IMITREX) 6 MG/0.5ML SOLN injection Inject 6 mg into the skin every 2 (two) hours as needed for migraine. F   More than a month at Unknown time  . ZOLMitriptan (ZOMIG NA) Place 1 spray into the nose daily as needed (for migraines).    More than a month at Unknown time    Review of Systems  Constitutional: Positive for chills. Negative for fever and malaise/fatigue.  Gastrointestinal: Positive for abdominal pain. Negative for nausea and vomiting.  Genitourinary: Positive for dysuria, urgency, frequency and hematuria. Negative for flank pain.       Neg - vaginal bleeding   Physical Exam   Blood pressure 122/63, pulse 97, temperature 99.3 F (37.4 C), temperature source Oral, height 5\' 6"  (1.676 m), weight 224 lb 6 oz (101.776 kg).  Physical Exam  Nursing note and vitals reviewed. Constitutional: She is oriented to person, place, and time. She appears well-developed and well-nourished. No distress.  HENT:  Head: Normocephalic and atraumatic.  Cardiovascular: Normal rate.   Respiratory: Effort normal.  GI: Soft. She exhibits no distension and no mass. There is no tenderness. There is no rebound, no guarding and no CVA tenderness.  Musculoskeletal:       Lumbar back: She exhibits no tenderness, no bony tenderness, no swelling, no pain and no spasm.  Neurological: She is alert and oriented to person, place, and time.  Skin: Skin is warm and dry. No erythema.  Psychiatric: She has a normal mood and affect.    Results for orders placed or performed during the hospital encounter of 04/24/15 (from the past 24 hour(s))  Urinalysis, Routine w reflex microscopic (not at [facility])     Status: Abnormal   Collection Time: 04/24/15  5:45 AM  Result Value Ref Range   Color, Urine YELLOW YELLOW   APPearance CLOUDY (A) CLEAR   Specific Gravity, Urine 1.020 1.005 - 1.030   pH 7.0 5.0 - 8.0   Glucose, UA NEGATIVE NEGATIVE mg/dL   Hgb urine dipstick LARGE (A) NEGATIVE   Bilirubin Urine SMALL  (A) NEGATIVE   Ketones, ur 15 (A) NEGATIVE mg/dL   Protein, ur 100 (A) NEGATIVE mg/dL   Urobilinogen, UA 0.2 0.0 - 1.0 mg/dL   Nitrite POSITIVE (A) NEGATIVE   Leukocytes, UA LARGE (A) NEGATIVE  Urine microscopic-add on     Status: None   Collection Time: 04/24/15  5:45 AM  Result Value Ref Range   Squamous Epithelial /  LPF RARE RARE   WBC, UA 7-10 <3 WBC/hpf   RBC / HPF 21-50 <3 RBC/hpf  CBC with Differential/Platelet     Status: Abnormal   Collection Time: 04/24/15  6:00 AM  Result Value Ref Range   WBC 14.0 (H) 4.0 - 10.5 K/uL   RBC 4.79 3.87 - 5.11 MIL/uL   Hemoglobin 13.7 12.0 - 15.0 g/dL   HCT 41.0 36.0 - 46.0 %   MCV 85.6 78.0 - 100.0 fL   MCH 28.6 26.0 - 34.0 pg   MCHC 33.4 30.0 - 36.0 g/dL   RDW 14.3 11.5 - 15.5 %   Platelets 281 150 - 400 K/uL   Neutrophils Relative % 81 %   Neutro Abs 11.3 (H) 1.7 - 7.7 K/uL   Lymphocytes Relative 13 %   Lymphs Abs 1.9 0.7 - 4.0 K/uL   Monocytes Relative 5 %   Monocytes Absolute 0.7 0.1 - 1.0 K/uL   Eosinophils Relative 1 %   Eosinophils Absolute 0.2 0.0 - 0.7 K/uL   Basophils Relative 0 %   Basophils Absolute 0.0 0.0 - 0.1 K/uL    MAU Course  Procedures None  MDM UA and CBC today Discussed patient with Dr. Sabra Heck. Agrees with plan for treatment with Macrobid. Patient can take previously prescribed pain medication PRN. Urine culture pending.  Mild leukocytosis noted. Patient is afebrile and without CVA tenderness today. Cystitis is more likely than Pyelonephritis at this time.  Assessment and Plan  A: UTI   P: Discharge home Rx for Macrobid given to patient Warning signs for pyelonephritis discussed Patient advised to follow-up with [facility] as needed or if symptoms were to change or worsen Patient may return to MAU as needed or if her condition were to change or worsen   Luvenia Redden, PA-C  04/24/2015, 6:51 AM

## 2015-04-24 NOTE — Discharge Instructions (Signed)

## 2015-04-24 NOTE — Telephone Encounter (Signed)
Pt called on call physician stating "I think I know what's wrong with me.  I have a urinary tract infection and I need antibiotics".  Pt reports increased dysuria during the last day but is now passing frank blood with clots "like a menstrual cycle" when she urinates.  It is uncomfortable and she is wondering if I will call in antibiotics for her.  Denies fever.  No hx of diabetes.   Advised pt I would have her come into the office during the day so I feel it is best to be evaluated in the ER since it is still several hours until [facility] will be open.    Pt voices understanding and agrees to go to be seen.  MAU at [facility] was called and made aware.

## 2015-04-24 NOTE — MAU Note (Signed)
PT  SAYS SHE WOKE  AT  0300-  WITH PAIN ON VOIDING.   - ONLY  SMALL AMTS.    CALLED   DR  MILLER-  TOLD  TO COME  HERE.

## 2015-04-26 ENCOUNTER — Telehealth: Payer: Self-pay | Admitting: *Deleted

## 2015-04-26 ENCOUNTER — Encounter: Payer: Self-pay | Admitting: Gynecology

## 2015-04-26 ENCOUNTER — Ambulatory Visit (INDEPENDENT_AMBULATORY_CARE_PROVIDER_SITE_OTHER): Payer: 59 | Admitting: Gynecology

## 2015-04-26 VITALS — BP 124/76

## 2015-04-26 DIAGNOSIS — N3001 Acute cystitis with hematuria: Secondary | ICD-10-CM

## 2015-04-26 LAB — URINALYSIS W MICROSCOPIC + REFLEX CULTURE
Bilirubin Urine: NEGATIVE
Casts: NONE SEEN [LPF]
Crystals: NONE SEEN [HPF]
Glucose, UA: NEGATIVE
Ketones, ur: NEGATIVE
Leukocytes, UA: NEGATIVE
Nitrite: NEGATIVE
Protein, ur: NEGATIVE
Specific Gravity, Urine: <1.005 (ref 1.001–1.035)
Yeast: NONE SEEN [HPF]
pH: 6 (ref 5.0–8.0)

## 2015-04-26 LAB — URINE CULTURE: Culture: >=100000

## 2015-04-26 NOTE — Telephone Encounter (Signed)
-----   Message from Anastasio Auerbach, MD sent at 04/26/2015 11:21 AM EDT ----- Schedule an appointment next week, preferably Friday with urology reference rule out renal lithiasis. Recent episode of hemorrhagic cystitis with flank pain

## 2015-04-26 NOTE — Progress Notes (Signed)
Laura Smith [birth year] [id number]        54 y.o.  G1P1001 Presents with a history of relatively acute onset of hemorrhagic cystitis with urination passing clots and low back pain primarily on the left.  Low-grade temperature in the 99 range. Frequency and urgency. Evaluated in the emergency room 04/24/2015 with diagnosis of UTI and treated with Macrodantin 7 days. Urine culture did show Escherichia coli sensitive to Macrodantin. Patient notes since then her blood has cleared and she is feeling a little better but still having left flank discomfort although better since onset. She has a son-in-law with kidney stones and she wonders whether she is having a kidney stone.  Past medical history,surgical history, problem list, medications, allergies, family history and social history were all reviewed and documented in the EPIC chart.  Directed ROS with pertinent positives and negatives documented in the history of present illness/assessment and plan.  Exam: Kim assistant Filed Vitals:   04/26/15 1100  BP: 124/76   General appearance:  Normal Spine straight with left percussion CVA tenderness. Abdomen soft nontender without masses guarding rebound Pelvic external BUS vagina normal. Bimanual without masses or tenderness  Assessment/Plan:  54 y.o. G1P1001 with hemorrhagic cystitis on appropriate antibiotic. Overall is feeling better 2 days into treatment. Question is to whether she has baseline kidney stone. Reviewed evaluation to include spiral CT. At this point would recommend continuing antibiotics, pushing fluids and monitoring for passage of stone. Will make a follow up appointment with urology next week to see if they feel further studies are warranted in pursuit of stone. ER evaluation at [facility] over the weekend if pain starts to worsen instead of continuingly improving.  Patient comfortable with the plan.    Anastasio Auerbach MD, 11:15 AM 04/26/2015

## 2015-04-26 NOTE — Telephone Encounter (Signed)
Notes faxed they will contact me with time and date to relay to patient.

## 2015-04-26 NOTE — Addendum Note (Signed)
Addended by: Nelva Nay on: 04/26/2015 11:26 AM   Modules accepted: Orders

## 2015-04-26 NOTE — Patient Instructions (Signed)
Office will call you to arrange a urology a follow up appointment. Follow up sooner if your pain/urinary symptoms worsen.

## 2015-04-27 LAB — URINE CULTURE
Colony Count: NO GROWTH
Organism ID, Bacteria: NO GROWTH

## 2015-04-29 NOTE — Telephone Encounter (Signed)
Appointment on 04/30/15 @ 2 pm with Dr.Henrick pt will need to come with full bladder, left message for pt to call.

## 2015-05-10 ENCOUNTER — Encounter: Payer: 59 | Admitting: Gynecology

## 2015-05-15 ENCOUNTER — Ambulatory Visit (INDEPENDENT_AMBULATORY_CARE_PROVIDER_SITE_OTHER): Payer: 59 | Admitting: Gynecology

## 2015-05-15 ENCOUNTER — Encounter: Payer: Self-pay | Admitting: Gynecology

## 2015-05-15 VITALS — BP 124/78 | Ht 67.0 in | Wt 224.0 lb

## 2015-05-15 DIAGNOSIS — Z01419 Encounter for gynecological examination (general) (routine) without abnormal findings: Secondary | ICD-10-CM | POA: Diagnosis not present

## 2015-05-15 NOTE — Addendum Note (Signed)
Addended by: Joaquin Music on: 05/15/2015 04:27 PM   Modules accepted: Orders

## 2015-05-15 NOTE — Progress Notes (Signed)
Laura Smith [birth year] [id number]        54 y.o.  G1P1001  No LMP recorded. Patient is postmenopausal. for annual exam.  Doing well without complaints.  Past medical history,surgical history, problem list, medications, allergies, family history and social history were all reviewed and documented as reviewed in the EPIC chart.  ROS:  Performed with pertinent positives and negatives included in the history, assessment and plan.   Additional significant findings :  none   Exam: Kim Counsellor Vitals:   05/15/15 1547  BP: 124/78  Height: 5\' 7"  (1.702 m)  Weight: 224 lb (101.606 kg)   General appearance:  Normal affect, orientation and appearance. Skin: Grossly normal HEENT: Without gross lesions.  No cervical or supraclavicular adenopathy. Thyroid normal.  Lungs:  Clear without wheezing, rales or rhonchi Cardiac: RR, without RMG Abdominal:  Soft, nontender, without masses, guarding, rebound, organomegaly or hernia Breasts:  Examined lying and sitting without masses, retractions, discharge or axillary adenopathy. Pelvic:  Ext/BUS/vagina normal with atrophic changes  Cervix normal  Uterus anteverted, normal size, shape and contour, midline and mobile nontender   Adnexa  Without masses or tenderness    Anus and perineum  Normal excepting old external hemorrhoids  Rectovaginal  Normal sphincter tone without palpated masses or tenderness.    Assessment/Plan:  54 y.o. G60P1001 female for annual exam.   1. Postmenopausal/atrophic genital changes. Patient without significant symptoms of hot flashes, night sweats vaginal dryness or any vaginal bleeding. Continue to monitor report any issues or vaginal bleeding. 2. Pap smear/HPV 2014 negative. No Pap smear done today. No history of significant abnormal Pap smears. 3. Mammography 05/2014. Reminded patient to schedule next month. SBE monthly reviewed. 4. Colonoscopy 2015. Repeat at their recommended interval. 5. Old external  hemorrhoids. Not overly bothersome to the patient. Continue to observe. 6. Health maintenance. We discussed her BMI of 35 and the need to institute an exercise program along with dietary restrictions.  No routine blood work done as patient reports this done at her primary physician's office. Follow up 1 year, sooner as needed.   Anastasio Auerbach MD, 4:21 PM 05/15/2015

## 2015-05-15 NOTE — Patient Instructions (Signed)

## 2015-05-16 LAB — URINALYSIS W MICROSCOPIC + REFLEX CULTURE
Bacteria, UA: NONE SEEN [HPF]
Bilirubin Urine: NEGATIVE
Casts: NONE SEEN [LPF]
Crystals: NONE SEEN [HPF]
Glucose, UA: NEGATIVE
Hgb urine dipstick: NEGATIVE
Ketones, ur: NEGATIVE
Leukocytes, UA: NEGATIVE
Nitrite: NEGATIVE
Protein, ur: NEGATIVE
RBC / HPF: NONE SEEN RBC/HPF (ref <=2)
Specific Gravity, Urine: 1.011 (ref 1.001–1.035)
Squamous Epithelial / LPF: NONE SEEN [HPF] (ref <=5)
WBC, UA: NONE SEEN WBC/HPF (ref <=5)
Yeast: NONE SEEN [HPF]
pH: 5.5 (ref 5.0–8.0)

## 2015-05-24 ENCOUNTER — Encounter: Payer: Self-pay | Admitting: Gynecology

## 2015-06-05 ENCOUNTER — Other Ambulatory Visit: Payer: Self-pay | Admitting: Internal Medicine

## 2015-06-05 NOTE — Telephone Encounter (Signed)
Sent to the pharmacy by e-scribe. 

## 2015-07-19 ENCOUNTER — Encounter: Payer: Self-pay | Admitting: Adult Health

## 2015-07-19 ENCOUNTER — Ambulatory Visit (INDEPENDENT_AMBULATORY_CARE_PROVIDER_SITE_OTHER): Payer: 59 | Admitting: Adult Health

## 2015-07-19 VITALS — BP 122/80 | Temp 99.2°F | Ht 67.0 in | Wt 221.7 lb

## 2015-07-19 DIAGNOSIS — M25511 Pain in right shoulder: Secondary | ICD-10-CM

## 2015-07-19 DIAGNOSIS — M25561 Pain in right knee: Secondary | ICD-10-CM | POA: Diagnosis not present

## 2015-07-19 MED ORDER — METHYLPREDNISOLONE 4 MG PO TBPK: ORAL_TABLET | ORAL | Status: DC

## 2015-07-19 MED ORDER — CYCLOBENZAPRINE HCL 10 MG PO TABS: 10.0000 mg | ORAL_TABLET | Freq: Three times a day (TID) | ORAL | Status: DC | PRN

## 2015-07-19 NOTE — Patient Instructions (Addendum)
It was great meeting you today   Your exam is consistent with a pinched nerve in the shoulder. I have sent in a prescription for prednisone and Flexeril ( muscle relaxer), take these as directed. Continue with the heat and muscle rubs. It is advised that your husband continue to massage your back.   I believe you have tendonisiits in the knee. Try and rest as much as possible, keep an ACE wrap on the knee and ice for 20 minutes at a time throughout the day. You can take ibuprofen or Aleve.   Follow up if no improvement.     Pinched Nerve A pinched nerve is a type of injury that occurs when too much pressure is placed on a nerve. This pressure can cause pain, burning, and muscle weakness in places such as your arm, hand, back, leg, or neck. A nerve can become permanently damaged if it is severely pinched or if it has been pinched for a long time. CAUSES This condition may be caused by:  The passing of a nerve through a narrow area between bones or other body structures.  Loss of blood supply to a nerve.  A nerve being stretched from an injury.  A sudden injury with swelling.  Wear and tear that occurs over several years.  Changes that occur in the spine with age. SYMPTOMS The most common symptom of a pinched nerve is a tingling feeling or numbness. Other symptoms include:  Pain that radiates from the affected nerve to the body part that the nerve supplies.  A burning feeling.  Muscle weakness in the muscles supplied by the injured nerve. DIAGNOSIS This condition is diagnosed with a physical exam. During the exam, a health care provider will check for numbness and muscle weakness and move the affected body parts to test for pain. You may also have other tests, such as:  X-rays to check for bone damage.  An MRI or CT scan to check for nerve damage.  Electromyography (EMG) to check for electrical signals passing through nerves to muscles. TREATMENT The first treatment for a  pinched nerve is usually rest and supportive devices, such as a splint, brace, or neck collar. Additional treatment depends on symptoms and the amount of nerve damage. This can include:  Medicines, such as:  Numbing medicine injections.  Nonsteroidal anti-inflammatory drugs (NSAIDs).  Steroid medicines in pill form or by injection.  Physical therapy to relieve pain, maintain movement, and improve muscle strength.  Surgery. This may be done if other treatments do not work. HOME CARE INSTRUCTIONS  Only take medicines as directed by your health care provider.  Wear supportive or protective devices as directed by your health care provider.  Do stretching and strengthening exercises at home as directed by your health care provider.  Rest as needed.  Keep all follow-up visits as directed by your health care provider. This is important. SEEK MEDICAL CARE IF:  Your condition does not improve with treatment.  Your pain, numbness, or weakness suddenly gets worse.   This information is not intended to replace advice given to you by your health care provider. Make sure you discuss any questions you have with your health care provider.   Document Released: 05/29/2002 Document Revised: 06/29/2014 Document Reviewed: 03/14/2014 Elsevier Interactive Patient Education [employer].

## 2015-07-19 NOTE — Progress Notes (Signed)
   Subjective:    Patient ID: Laura Smith, female    DOB: October 16, 1960, 55 y.o.   MRN: [id number]  HPI  55 year old female who presents to the office today with two separate complaints.   1) Pain in her right shoulder x 3 weeks. The pain is constant and feels like a " tooth ache" but with palpation " it is a sharp pain."  She has tried OTC NSAIDS, heat, and massage without much relief. She has occasional numbness and tingling that radiates down her right arm.   2) Right knee pain x 1 month. This is also described as a "tooth ache", she feels as though the pain is there due to her sitting cross legged at work. Denies any difficulties with ROM. Has not had any trauma to the area.   Review of Systems  Constitutional: Negative.   Respiratory: Negative.   Cardiovascular: Negative.   Musculoskeletal: Positive for myalgias. Negative for joint swelling, gait problem, neck pain and neck stiffness.  Skin: Negative.   Neurological: Negative.   All other systems reviewed and are negative.      Objective:   Physical Exam  Constitutional: She is oriented to person, place, and time. She appears well-developed and well-nourished. No distress.  Cardiovascular: Normal rate, regular rhythm, normal heart sounds and intact distal pulses.  Exam reveals no gallop and no friction rub.   No murmur heard. Pulmonary/Chest: Effort normal and breath sounds normal. No respiratory distress. She has no wheezes. She has no rales. She exhibits no tenderness.  Musculoskeletal: Normal range of motion. She exhibits tenderness. She exhibits no edema.  No decrease in ROM of right shoulder. Pain with palpation to right scapula. No bruising or masses felt. No increased pain against resistance. 5/5 grip strength  No decrease in ROM of right knee. Pain with palpation to MCL. No bruising or trauma noted. No pain with knee to chest, internal or external rotation. Patella stable.   Neurological: She is alert and oriented to  person, place, and time.  Skin: Skin is warm and dry. No rash noted. She is not diaphoretic. No erythema. No pallor.  Psychiatric: She has a normal mood and affect. Her behavior is normal. Judgment and thought content normal.  Nursing note and vitals reviewed.      Assessment & Plan:  1. Pain in joint of right shoulder  Likely pinched nerve.  - cyclobenzaprine (FLEXERIL) 10 MG tablet; Take 1 tablet (10 mg total) by mouth 3 (three) times daily as needed for muscle spasms.  Dispense: 30 tablet; Refill: 0 - methylPREDNISolone (MEDROL DOSEPAK) 4 MG TBPK tablet; Take as directed  Dispense: 21 tablet; Refill: 0 - Continue with heat and massage - Follow up if no improvement.  - Consider cortisone injection 2. Knee pain, right - Likely tendonitis  - Compression, ice, eleavtion, Nsaids - No concern about ACL/MCL tear - No instability.

## 2015-07-19 NOTE — Progress Notes (Signed)
Pre visit review using our clinic review tool, if applicable. No additional management support is needed unless otherwise documented below in the visit note. 

## 2015-07-29 ENCOUNTER — Encounter: Payer: Self-pay | Admitting: Gynecology

## 2015-09-23 ENCOUNTER — Encounter: Payer: Self-pay | Admitting: Gynecology

## 2015-09-23 ENCOUNTER — Ambulatory Visit (INDEPENDENT_AMBULATORY_CARE_PROVIDER_SITE_OTHER): Payer: 59 | Admitting: Gynecology

## 2015-09-23 VITALS — BP 118/76

## 2015-09-23 DIAGNOSIS — N3000 Acute cystitis without hematuria: Secondary | ICD-10-CM

## 2015-09-23 DIAGNOSIS — R300 Dysuria: Secondary | ICD-10-CM

## 2015-09-23 LAB — URINALYSIS W MICROSCOPIC + REFLEX CULTURE
Bilirubin Urine: NEGATIVE
Casts: NONE SEEN [LPF]
Crystals: NONE SEEN [HPF]
Glucose, UA: NEGATIVE
Ketones, ur: NEGATIVE
Leukocytes, UA: NEGATIVE
Nitrite: NEGATIVE
Protein, ur: NEGATIVE
RBC / HPF: NONE SEEN RBC/HPF (ref <=2)
Specific Gravity, Urine: <1.005 (ref 1.001–1.035)
Squamous Epithelial / LPF: NONE SEEN [HPF] (ref <=5)
Yeast: NONE SEEN [HPF]
pH: 5.5 (ref 5.0–8.0)

## 2015-09-23 MED ORDER — CIPROFLOXACIN HCL 250 MG PO TABS: 250.0000 mg | ORAL_TABLET | Freq: Two times a day (BID) | ORAL | Status: DC

## 2015-09-23 NOTE — Progress Notes (Signed)
    Laura Smith [birth year] [id number]        55 y.o.  G1P1001 Presents with 3 days of urinary frequency, dysuria and urgency. History of UTIs in the past and feels like she is developing another one. No fever but slight chills. Slight left low back pain. No vaginal discharge or irritation. Is not sexually active. Has been taking a new Azo-Standard which has an antibacterial in it.  Past medical history,surgical history, problem list, medications, allergies, family history and social history were all reviewed and documented in the EPIC chart.  Directed ROS with pertinent positives and negatives documented in the history of present illness/assessment and plan.  Exam: Caryn Bee assistant Filed Vitals:   09/23/15 0926  BP: 118/76   General appearance:  Normal Spine straight without CVA tenderness Abdomen soft nontender without masses guarding rebound Pelvic external BUS vagina normal. Cervix normal. Uterus normal size midline mobile nontender. Adnexa without masses or tenderness  Assessment/Plan:  55 y.o. G1P1001 with symptoms to suggest UTI. Urinalysis shows few bacteria and 0-5 WBC. Question whether she's partially treated as she's been pushing fluids and taking this new Azo-Standard.  Given her history and her familiarity with the symptoms I'm going to cover her with ciprofloxacin 250 mg twice a day 3 days. Check urine culture. Follow up if symptoms persist, worsen or recur.    Anastasio Auerbach MD, 9:59 AM 09/23/2015

## 2015-09-23 NOTE — Patient Instructions (Signed)
Take the ciprofloxacin antibiotic twice daily for 3 days. Follow up if your symptoms persist, worsen or recur.

## 2015-09-25 LAB — URINE CULTURE: Colony Count: 80000

## 2015-10-03 ENCOUNTER — Ambulatory Visit (INDEPENDENT_AMBULATORY_CARE_PROVIDER_SITE_OTHER): Payer: 59 | Admitting: Internal Medicine

## 2015-10-03 ENCOUNTER — Encounter: Payer: Self-pay | Admitting: Family Medicine

## 2015-10-03 ENCOUNTER — Telehealth: Payer: Self-pay | Admitting: Internal Medicine

## 2015-10-03 ENCOUNTER — Encounter: Payer: Self-pay | Admitting: Internal Medicine

## 2015-10-03 ENCOUNTER — Ambulatory Visit (INDEPENDENT_AMBULATORY_CARE_PROVIDER_SITE_OTHER)
Admission: RE | Admit: 2015-10-03 | Discharge: 2015-10-03 | Disposition: A | Discharge status: Home / Self Care | Payer: 59 | Source: Ambulatory Visit | Attending: Internal Medicine | Admitting: Internal Medicine

## 2015-10-03 VITALS — BP 124/80 | Temp 99.5°F | Wt 218.4 lb

## 2015-10-03 DIAGNOSIS — J189 Pneumonia, unspecified organism: Secondary | ICD-10-CM

## 2015-10-03 DIAGNOSIS — R6883 Chills (without fever): Secondary | ICD-10-CM

## 2015-10-03 DIAGNOSIS — J22 Unspecified acute lower respiratory infection: Secondary | ICD-10-CM

## 2015-10-03 DIAGNOSIS — J988 Other specified respiratory disorders: Secondary | ICD-10-CM | POA: Diagnosis not present

## 2015-10-03 MED ORDER — HYDROCOD POLST-CPM POLST ER 10-8 MG/5ML PO SUER: 5.0000 mL | Freq: Two times a day (BID) | ORAL | Status: DC | PRN

## 2015-10-03 MED ORDER — AZITHROMYCIN 250 MG PO TABS: ORAL_TABLET | ORAL | Status: DC

## 2015-10-03 NOTE — Patient Instructions (Addendum)
This acts like a viral resp infection that will run its courses  Could be low grade flu. Get x ray  To make sure we are not missing pneumonia as discussed. If ok then  Rest fluids cough med ok  Cough may last 1-2 weeks but illness should be better after weekend.   resart  flonase of nasacort   Incase    Allergy is contributing to your sx .

## 2015-10-03 NOTE — Progress Notes (Signed)
Pre visit review using our clinic review tool, if applicable. No additional management support is needed unless otherwise documented below in the visit note.  Chief Complaint  Patient presents with  . Cough    Started last Sunday.  Has tried multiple OTC medications  . Hoarse  . Generalized Body Aches  . Chills  . Headache    HPI: Laura Smith 55 y.o.  Comes in for  sda apppt today  Coughing up phelg .  Body  Aches   .  ? If had fever and chills .  Onset 4 days  But no st   phelgm is yellow and some thickenes  No  hemoptysis  Gets fall alergy some nose con  No face pain  No sob  ROS: See pertinent positives and negatives per HPI. No sob     Past Medical History  Diagnosis Date  . Migraine   . Hemorrhoids   . Hypertension   . Endometriosis     surgery laser 2001  . Hx of basal cell carcinoma   . Allergy   . Urinary tract infection     Family History  Problem Relation Age of Onset  . Breast cancer Mother     Age 20  . Colon cancer Mother 20  . Heart disease Father   . Hypertension Paternal Grandmother   . Heart disease Paternal Grandmother   . Stroke Paternal Grandmother   . Cancer Paternal Grandmother     Lung  . Cancer Paternal Grandfather     Prostate  . Breast cancer Cousin     Age 63's  . Stroke Maternal Grandfather     Social History   Social History  . Marital Status: Married    Spouse Name: N/A  . Number of Children: N/A  . Years of Education: N/A   Social History Main Topics  . Smoking status: Never Smoker   . Smokeless tobacco: Never Used  . Alcohol Use: No  . Drug Use: No  . Sexual Activity: Not Currently    Birth Control/ Protection: Post-menopausal     Comment: Pt declined sexual hx questions   Other Topics Concern  . None   Social History Narrative   h h of 2 Pet cat   Daughter /  Married    g1 p1   Works [place]    [employer]     Outpatient Prescriptions Prior to Visit  Medication Sig Dispense Refill  .  Ascorbic Acid (VITAMIN C) 500 MG CAPS Take 1,000 mg by mouth every morning.     Marland Kitchen b complex vitamins tablet Take 2 tablets by mouth daily.     Marland Kitchen CALCIUM-MAGNESIUM PO Take 3 tablets by mouth daily.     . cetirizine (ZYRTEC) 10 MG tablet Take 10 mg by mouth daily.    . cyclobenzaprine (FLEXERIL) 10 MG tablet Take 1 tablet (10 mg total) by mouth 3 (three) times daily as needed for muscle spasms. 30 tablet 0  . hydrochlorothiazide (HYDRODIURIL) 25 MG tablet TAKE 1 TABLET DAILY 90 tablet 2  . HYDROcodone-acetaminophen (NORCO) 7.5-325 MG per tablet Take 1-2 tablets by mouth every 4 (four) hours as needed for pain (as needed for migraine). Reported on 09/23/2015    . HYDROmorphone (DILAUDID) 4 MG tablet Take 4-8 mg by mouth every 4 (four) hours as needed for pain (rescure for migraine). Reported on 09/23/2015    . montelukast (SINGULAIR) 10 MG tablet Take 1 tablet (10 mg total)  by mouth daily as needed (for seasonal allergies, uses as needed). 90 tablet 1  . Multiple Vitamins-Iron (MULTIVITAMIN/IRON) TABS Take 1 tablet by mouth daily.     Marland Kitchen nystatin (MYCOSTATIN/NYSTOP) 100000 UNIT/GM POWD Apply 2 -3  Time per day to intertrigo flare. 60 g 3  . sulindac (CLINORIL) 200 MG tablet Take 200 mg by mouth 2 (two) times daily as needed (headache). Reported on 09/23/2015    . SUMAtriptan (IMITREX) 6 MG/0.5ML SOLN injection Inject 6 mg into the skin every 2 (two) hours as needed for migraine. Reported on 09/23/2015    . verapamil (CALAN-SR) 180 MG CR tablet TAKE 1 TABLET AT BEDTIME 90 tablet 2  . ZOLMitriptan (ZOMIG NA) Place 1 spray into the nose daily as needed (for migraines). Reported on 09/23/2015    . meclizine (ANTIVERT) 25 MG tablet TAKE 2 TABLET (50 MG TOTAL) BY MOUTH 2 (TWO) TIMES DAILY AS NEEDED (FOR TRAVEL). (Patient not taking: Reported on 09/23/2015) 30 tablet 0  . ciprofloxacin (CIPRO) 250 MG tablet Take 1 tablet (250 mg total) by mouth 2 (two) times daily. For 3 days 6 tablet 0   No facility-administered  medications prior to visit.     EXAM:  BP 124/80 mmHg  Temp(Src) 99.5 F (37.5 C) (Oral)  Wt 218 lb 6.4 oz (99.066 kg)  Body mass index is 34.2 kg/(m^2).  GENERAL: vitals reviewed and listed above, alert, oriented, appears well hydrated and in no acute distress non toxic  In mild congestion HEENT: atraumatic, conjunctiva  clear, no obvious abnormalities on inspection of external nose and ears tms clear face non tenderOP : no lesion edema or exudate  NECK: no obvious masses on inspection palpation  LUNGS: clear to auscultation bilaterally, no wheezes, rales or rhonchi,  bs seem = ocass cough  CV: HRRR, no clubbing cyanosis or  peripheral edema nl cap refill  MS: moves all extremities without noticeable focal  abnormality PSYCH: pleasant and cooperative, no obvious depression or anxiety  ASSESSMENT AND PLAN:  Discussed the following assessment and plan:  Acute respiratory infection - Plan: DG Chest 2 View  Chills - Plan: DG Chest 2 View  Lingular pneumonia prob viral but get cxray -Patient advised to return or notify health care team  if symptoms worsen ,persist or new concerns arise.  Patient Instructions  This acts like a viral resp infection that will run its courses  Could be low grade flu. Get x ray  To make sure we are not missing pneumonia as discussed. If ok then  Rest fluids cough med ok  Cough may last 1-2 weeks but illness should be better after weekend.   resart  flonase of nasacort   Incase    Allergy is contributing to your sx .      [name]. Panosh M.D.  Addendum x ray show lingular inflitrate   Add z pack and rov in 7- 10 days

## 2015-10-03 NOTE — Telephone Encounter (Signed)
Patient Name: Laura Smith  DOB: 1960-07-18    Initial Comment Caller states chills, has hoarse voice, its all in her throat    Nurse Assessment  Nurse: Raphael Gibney, RN, Vanita Ingles Date/Time (Eastern Time): 10/03/2015 9:13:48 AM  Confirm and document reason for call. If symptomatic, describe symptoms. You must click the next button to save text entered. ---Caller states she is hoarse. She already has appt at 10:30 am.  Has the patient traveled out of the country within the last 30 days? ---Not Applicable  Does the patient have any new or worsening symptoms? ---Yes  Will a triage be completed? ---No  Select reason for no triage. ---Other  Please document clinical information provided and list any resource used. ---Pt already has appt at 10:30 am today.     Guidelines    Guideline Title Affirmed Question Affirmed Notes       Final Disposition User   Clinical Call [place], RN, Vanita Ingles

## 2015-10-03 NOTE — Telephone Encounter (Signed)
Patient has an appointment 10/03/15 - Laura Smith

## 2015-10-14 ENCOUNTER — Ambulatory Visit (INDEPENDENT_AMBULATORY_CARE_PROVIDER_SITE_OTHER): Payer: 59 | Admitting: Internal Medicine

## 2015-10-14 ENCOUNTER — Encounter: Payer: Self-pay | Admitting: Internal Medicine

## 2015-10-14 VITALS — BP 120/78 | HR 77 | Temp 98.7°F | Wt 217.2 lb

## 2015-10-14 DIAGNOSIS — J189 Pneumonia, unspecified organism: Secondary | ICD-10-CM

## 2015-10-14 NOTE — Patient Instructions (Signed)
Fatigue can continue for a while .  Call if want  Letter for limited day hours. Plan to get     Follow up x ray in another  Few weeks . Second week of may .2017  As long as doing ok and not  Relapsing  In the meantime .

## 2015-10-14 NOTE — Progress Notes (Signed)
Pre visit review using our clinic review tool, if applicable. No additional management support is needed unless otherwise documented below in the visit note.  Chief Complaint  Patient presents with  . Follow-up    HPI: Laura Smith 55 y.o.   comesin for fu  Of resp infection  pna  Since last week given antibiotic   About  80 % better   Still tired wants to stay in be no sob or cp.  Sometimes has  Phlegm cough   Had dark red blood  Streak   In past  X 1  Gone. Working 12 hours days jsut went back last week  Nose of med . No fever still some decrease appetitie FINDINGS: Patchy airspace disease in the lingula, seen in both projections. Normal heart size mediastinal contours. No effusion or cavitation.  IMPRESSION: Lingular pneumonia. Followup PA and lateral chest X-ray is recommended in 3-4 weeks following trial of antibiotic therapy to ensure resolution.   Electronically Signed  By: Monte Fantasia M.D.  On: 10/03/2015 12:11  ROS: See pertinent positives and negatives per HPI.  Past Medical History  Diagnosis Date  . Migraine   . Hemorrhoids   . Hypertension   . Endometriosis     surgery laser 2001  . Hx of basal cell carcinoma   . Allergy   . Urinary tract infection     Family History  Problem Relation Age of Onset  . Breast cancer Mother     Age 39  . Colon cancer Mother 5  . Heart disease Father   . Hypertension Paternal Grandmother   . Heart disease Paternal Grandmother   . Stroke Paternal Grandmother   . Cancer Paternal Grandmother     Lung  . Cancer Paternal Grandfather     Prostate  . Breast cancer Cousin     Age 84's  . Stroke Maternal Grandfather     Social History   Social History  . Marital Status: Married    Spouse Name: N/A  . Number of Children: N/A  . Years of Education: N/A   Social History Main Topics  . Smoking status: Never Smoker   . Smokeless tobacco: Never Used  . Alcohol Use: No  . Drug Use: No  . Sexual Activity:  Not Currently    Birth Control/ Protection: Post-menopausal     Comment: Pt declined sexual hx questions   Other Topics Concern  . None   Social History Narrative   h h of 2 Pet cat   Daughter /  Married    g1 p1   Works [place]    [employer]     Outpatient Prescriptions Prior to Visit  Medication Sig Dispense Refill  . Ascorbic Acid (VITAMIN C) 500 MG CAPS Take 1,000 mg by mouth every morning.     Marland Kitchen b complex vitamins tablet Take 2 tablets by mouth daily.     Marland Kitchen CALCIUM-MAGNESIUM PO Take 3 tablets by mouth daily.     . cetirizine (ZYRTEC) 10 MG tablet Take 10 mg by mouth daily.    . hydrochlorothiazide (HYDRODIURIL) 25 MG tablet TAKE 1 TABLET DAILY 90 tablet 2  . HYDROcodone-acetaminophen (NORCO) 7.5-325 MG per tablet Take 1-2 tablets by mouth every 4 (four) hours as needed for pain (as needed for migraine). Reported on 09/23/2015    . HYDROmorphone (DILAUDID) 4 MG tablet Take 4-8 mg by mouth every 4 (four) hours as needed for pain (rescure for migraine). Reported on  09/23/2015    . meclizine (ANTIVERT) 25 MG tablet TAKE 2 TABLET (50 MG TOTAL) BY MOUTH 2 (TWO) TIMES DAILY AS NEEDED (FOR TRAVEL). 30 tablet 0  . montelukast (SINGULAIR) 10 MG tablet Take 1 tablet (10 mg total) by mouth daily as needed (for seasonal allergies, uses as needed). 90 tablet 1  . Multiple Vitamins-Iron (MULTIVITAMIN/IRON) TABS Take 1 tablet by mouth daily.     Marland Kitchen nystatin (MYCOSTATIN/NYSTOP) 100000 UNIT/GM POWD Apply 2 -3  Time per day to intertrigo flare. 60 g 3  . sulindac (CLINORIL) 200 MG tablet Take 200 mg by mouth 2 (two) times daily as needed (headache). Reported on 09/23/2015    . SUMAtriptan (IMITREX) 6 MG/0.5ML SOLN injection Inject 6 mg into the skin every 2 (two) hours as needed for migraine. Reported on 09/23/2015    . verapamil (CALAN-SR) 180 MG CR tablet TAKE 1 TABLET AT BEDTIME 90 tablet 2  . ZOLMitriptan (ZOMIG NA) Place 1 spray into the nose daily as needed (for migraines). Reported  on 09/23/2015    . chlorpheniramine-HYDROcodone (TUSSIONEX PENNKINETIC ER) 10-8 MG/5ML SUER Take 5 mLs by mouth every 12 (twelve) hours as needed for cough. (Patient not taking: Reported on 10/14/2015) 140 mL 0  . azithromycin (ZITHROMAX Z-PAK) 250 MG tablet Take 2 po first day, then 1 po qd 6 tablet 0  . cyclobenzaprine (FLEXERIL) 10 MG tablet Take 1 tablet (10 mg total) by mouth 3 (three) times daily as needed for muscle spasms. 30 tablet 0   No facility-administered medications prior to visit.     EXAM:  BP 120/78 mmHg  Pulse 77  Temp(Src) 98.7 F (37.1 C) (Oral)  Wt 217 lb 3.7 oz (98.535 kg)  SpO2 95%  Body mass index is 34.02 kg/(m^2).  GENERAL: vitals reviewed and listed above, alert, oriented, appears well hydrated and in no acute distress HEENT: atraumatic, conjunctiva  clear, no obvious abnormalities on inspection of external nose and ears OP : no lesion edema or exudate  NECK: no obvious masses on inspection palpation  LUNGS: clear to auscultation bilaterally, no wheezes, rales or rhonchi, good air movement CV: HRRR, no clubbing cyanosis or  peripheral edema nl cap refill  MS: moves all extremities without noticeable focal  abnormality PSYCH: pleasant and cooperative, no obvious depression or anxiety  x ray report  ASSESSMENT AND PLAN:  Discussed the following assessment and plan:  Lingular pneumonia - improved   needs fu x ray   fu if not cont to improve or needs limited hours for ling work days  - Plan: DG Chest 2 View  -Patient advised to return or notify health care team  if symptoms worsen ,persist or new concerns arise.  Patient Instructions  Fatigue can continue for a while .  Call if want  Letter for limited day hours. Plan to get     Follow up x ray in another  Few weeks . Second week of may .2017  As long as doing ok and not  Relapsing  In the meantime .      Standley Smith. Panosh M.D.

## 2015-11-15 ENCOUNTER — Ambulatory Visit (INDEPENDENT_AMBULATORY_CARE_PROVIDER_SITE_OTHER)
Admission: RE | Admit: 2015-11-15 | Discharge: 2015-11-15 | Disposition: A | Discharge status: Home / Self Care | Payer: 59 | Source: Ambulatory Visit | Attending: Internal Medicine | Admitting: Internal Medicine

## 2015-11-15 DIAGNOSIS — J189 Pneumonia, unspecified organism: Secondary | ICD-10-CM

## 2016-01-05 ENCOUNTER — Other Ambulatory Visit: Payer: Self-pay | Admitting: Internal Medicine

## 2016-01-07 ENCOUNTER — Telehealth: Payer: Self-pay | Admitting: Internal Medicine

## 2016-01-07 MED ORDER — MECLIZINE HCL 25 MG PO TABS: ORAL_TABLET | ORAL | Status: DC

## 2016-01-07 NOTE — Telephone Encounter (Signed)
Pt need new Rx Meclizine 25 mg     Pharm:  [facility]

## 2016-01-07 NOTE — Telephone Encounter (Signed)
Sent to the pharmacy by e-scribe. 

## 2016-04-15 ENCOUNTER — Telehealth: Payer: Self-pay | Admitting: Internal Medicine

## 2016-04-15 MED ORDER — MECLIZINE HCL 25 MG PO TABS: ORAL_TABLET | ORAL | 0 refills | Status: DC

## 2016-04-15 NOTE — Telephone Encounter (Signed)
Ok to refill x 1  

## 2016-04-15 NOTE — Telephone Encounter (Signed)
Sent to the pharmacy by e-scribe. 

## 2016-04-15 NOTE — Telephone Encounter (Signed)
Pt states [place] and will need a refill of meclizine (ANTIVERT) 25 MG tablet  [facility]/ [address]

## 2016-04-17 ENCOUNTER — Other Ambulatory Visit (INDEPENDENT_AMBULATORY_CARE_PROVIDER_SITE_OTHER): Payer: 59

## 2016-04-17 DIAGNOSIS — Z0000 Encounter for general adult medical examination without abnormal findings: Secondary | ICD-10-CM

## 2016-04-17 LAB — CBC WITH DIFFERENTIAL/PLATELET
Basophils Absolute: 0 10*3/uL (ref 0.0–0.1)
Basophils Relative: 0.4 % (ref 0.0–3.0)
Eosinophils Absolute: 0.2 10*3/uL (ref 0.0–0.7)
Eosinophils Relative: 2.3 % (ref 0.0–5.0)
HCT: 42.5 % (ref 36.0–46.0)
Hemoglobin: 14.4 g/dL (ref 12.0–15.0)
Lymphocytes Relative: 33.9 % (ref 12.0–46.0)
Lymphs Abs: 2.8 10*3/uL (ref 0.7–4.0)
MCHC: 34 g/dL (ref 30.0–36.0)
MCV: 85.3 fl (ref 78.0–100.0)
Monocytes Absolute: 0.5 10*3/uL (ref 0.1–1.0)
Monocytes Relative: 6.5 % (ref 3.0–12.0)
Neutro Abs: 4.7 10*3/uL (ref 1.4–7.7)
Neutrophils Relative %: 56.9 % (ref 43.0–77.0)
Platelets: 335 10*3/uL (ref 150.0–400.0)
RBC: 4.98 Mil/uL (ref 3.87–5.11)
RDW: 14.1 % (ref 11.5–15.5)
WBC: 8.2 10*3/uL (ref 4.0–10.5)

## 2016-04-17 LAB — BASIC METABOLIC PANEL
BUN: 14 mg/dL (ref 6–23)
CO2: 31 mEq/L (ref 19–32)
Calcium: 9.3 mg/dL (ref 8.4–10.5)
Chloride: 102 mEq/L (ref 96–112)
Creatinine, Ser: 0.92 mg/dL (ref 0.40–1.20)
GFR: 67.06 mL/min (ref >60.00)
Glucose, Bld: 93 mg/dL (ref 70–99)
Potassium: 3.7 mEq/L (ref 3.5–5.1)
Sodium: 141 mEq/L (ref 135–145)

## 2016-04-17 LAB — HEPATIC FUNCTION PANEL
ALT: 20 U/L (ref 0–35)
AST: 21 U/L (ref 0–37)
Albumin: 4.3 g/dL (ref 3.5–5.2)
Alkaline Phosphatase: 59 U/L (ref 39–117)
Bilirubin, Direct: 0 mg/dL (ref 0.0–0.3)
Total Bilirubin: 0.5 mg/dL (ref 0.2–1.2)
Total Protein: 7.1 g/dL (ref 6.0–8.3)

## 2016-04-17 LAB — LIPID PANEL
Cholesterol: 183 mg/dL (ref 0–200)
HDL: 53.3 mg/dL (ref >39.00)
LDL Cholesterol: 91 mg/dL (ref 0–99)
NonHDL: 129.26
Total CHOL/HDL Ratio: 3
Triglycerides: 193 mg/dL — ABNORMAL HIGH (ref 0.0–149.0)
VLDL: 38.6 mg/dL (ref 0.0–40.0)

## 2016-04-20 LAB — TSH: TSH: 3.76 u[IU]/mL (ref 0.35–4.50)

## 2016-04-24 ENCOUNTER — Encounter: Payer: 59 | Admitting: Internal Medicine

## 2016-04-28 NOTE — Progress Notes (Signed)
Pre visit review using our clinic review tool, if applicable. No additional management support is needed unless otherwise documented below in the visit note.  Chief Complaint  Patient presents with  . Annual Exam    HPI: Patient  Laura Smith  55 y.o. comes in today for Preventive Health Care visit  Stress father has progressive neuro disease  Not eating as healthy seems to be craving carbs more often  Has   Uses magnesium calcium   Health Maintenance  Topic Date Due  . PAP SMEAR  04/28/2016  . Hepatitis C Screening  04/28/2017 (Originally [birth year])  . HIV Screening  04/28/2017 (Originally 01/30/1976)  . MAMMOGRAM  07/28/2017  . COLONOSCOPY  04/28/2019  . TETANUS/TDAP  04/29/2026  . INFLUENZA VACCINE  Completed   Health Maintenance Review LIFESTYLE:  Exercise:   Not a lot  Tobacco/ETS: no Alcohol:  n Sugar beverages: not a lot   eating more sugars some recently  Sleep: 8-+ as possible  Drug use: no HH of 2  father is dying  Some type progressive neur  Disease  stress Work:  Yes      ROS:  Frequent bms  Pos ps no blood weight loss cramops ask about if could be crohns  GEN/ HEENT: No fever, significant weight changes sweats headaches vision problems hearing changes, CV/ PULM; No chest pain shortness of breath cough, syncope,edema  change in exercise tolerance. GI /GU: No adominal pain, vomiting, change in bowel habits. No blood in the stool. No significant GU symptoms. SKIN/HEME: ,no acute skin rashes suspicious lesions or bleeding. No lymphadenopathy, nodules, masses.  NEURO/ PSYCH:  No neurologic signs such as weakness numbness. No depression anxiety. IMM/ Allergy: No unusual infections.  Allergy .   REST of 12 system review negative except as per HPI   Past Medical History:  Diagnosis Date  . Allergy   . Endometriosis    surgery laser 2001  . Hemorrhoids   . Hx of basal cell carcinoma   . Hypertension   . Migraine   . Urinary tract infection     Past  Surgical History:  Procedure Laterality Date  . [place]  . HYSTEROSCOPY  08/1999   AT TIME OF LAPAROSCOPY  . PELVIC LAPAROSCOPY  08/1999   AND HYSTEROSCOPY--ENDOMETRIOSIS    Family History  Problem Relation Age of Onset  . Breast cancer Mother     Age 57  . Colon cancer Mother 70  . Heart disease Father   . Hypertension Paternal Grandmother   . Heart disease Paternal Grandmother   . Stroke Paternal Grandmother   . Cancer Paternal Grandmother     Lung  . Cancer Paternal Grandfather     Prostate  . Breast cancer Cousin     Age 10's  . Stroke Maternal Grandfather     Social History   Social History  . Marital status: Married    Spouse name: N/A  . Number of children: N/A  . Years of education: N/A   Social History Main Topics  . Smoking status: Never Smoker  . Smokeless tobacco: Never Used  . Alcohol use No  . Drug use: No  . Sexual activity: Not Currently    Birth control/ protection: Post-menopausal     Comment: Pt declined sexual hx questions   Other Topics Concern  . None   Social History Narrative   h h of  2 Pet cat   Daughter /  Married    g1 p1   [employer]     Outpatient Medications Prior to Visit  Medication Sig Dispense Refill  . Ascorbic Acid (VITAMIN C) 500 MG CAPS Take 1,000 mg by mouth every morning.     Marland Kitchen b complex vitamins tablet Take 2 tablets by mouth daily.     Marland Kitchen CALCIUM-MAGNESIUM PO Take 3 tablets by mouth daily.     . cetirizine (ZYRTEC) 10 MG tablet Take 10 mg by mouth daily.    . hydrochlorothiazide (HYDRODIURIL) 25 MG tablet TAKE 1 TABLET DAILY 90 tablet 2  . HYDROcodone-acetaminophen (NORCO) 7.5-325 MG per tablet Take 1-2 tablets by mouth every 4 (four) hours as needed for pain (as needed for migraine). Reported on 09/23/2015    . HYDROmorphone (DILAUDID) 4 MG tablet Take 4-8 mg by mouth every 4 (four) hours as needed for  pain (rescure for migraine). Reported on 09/23/2015    . meclizine (ANTIVERT) 25 MG tablet TAKE 2 TABLET (50 MG TOTAL) BY MOUTH 2 (TWO) TIMES DAILY AS NEEDED (FOR TRAVEL). 30 tablet 0  . Multiple Vitamins-Iron (MULTIVITAMIN/IRON) TABS Take 1 tablet by mouth daily.     . sulindac (CLINORIL) 200 MG tablet Take 200 mg by mouth 2 (two) times daily as needed (headache). Reported on 09/23/2015    . SUMAtriptan (IMITREX) 6 MG/0.5ML SOLN injection Inject 6 mg into the skin every 2 (two) hours as needed for migraine. Reported on 09/23/2015    . verapamil (CALAN-SR) 180 MG CR tablet TAKE 1 TABLET AT BEDTIME 90 tablet 2  . ZOLMitriptan (ZOMIG NA) Place 1 spray into the nose daily as needed (for migraines). Reported on 09/23/2015    . montelukast (SINGULAIR) 10 MG tablet Take 1 tablet (10 mg total) by mouth daily as needed (for seasonal allergies, uses as needed). (Patient not taking: Reported on 04/29/2016) 90 tablet 1  . chlorpheniramine-HYDROcodone (TUSSIONEX PENNKINETIC ER) 10-8 MG/5ML SUER Take 5 mLs by mouth every 12 (twelve) hours as needed for cough. (Patient not taking: Reported on 10/14/2015) 140 mL 0  . nystatin (MYCOSTATIN/NYSTOP) 100000 UNIT/GM POWD Apply 2 -3  Time per day to intertrigo flare. 60 g 3   No facility-administered medications prior to visit.      EXAM:  BP 132/90 (BP Location: Right Arm, Patient Position: Sitting, Cuff Size: Large)   Temp 98.4 F (36.9 C) (Oral)   Ht 5' 6.5" (1.689 m)   Wt 220 lb 1.6 oz (99.8 kg)   BMI 34.99 kg/m   Body mass index is 34.99 kg/m.  Physical Exam: Vital signs reviewed [id number] is a well-developed well-nourished alert cooperative    who appearsr stated age in no acute distress.  HEENT: normocephalic atraumatic , Eyes: PERRL EOM's full, conjunctiva clear, Nares: paten,t no deformity discharge or tenderness., Ears: no deformity EAC's clear TMs with normal landmarks. Mouth: clear OP, no lesions, edema.  Moist mucous membranes. Dentition in adequate  repair. NECK: supple without masses, thyromegaly or bruits. CHEST/PULM:  Clear to auscultation and percussion breath sounds equal no wheeze , rales or rhonchi. No chest wall deformities or tenderness. Breast declined done by gyne  CV: PMI is nondisplaced, S1 S2 no gallops, murmurs, rubs. Peripheral pulses are full without delay.No JVD .  ABDOMEN: Bowel sounds normal nontender  No guard or rebound, no hepato splenomegal no CVA tenderness.  Extremtities:  No clubbing cyanosis or edema, no acute joint  swelling or redness no focal atrophy NEURO:  Oriented x3, cranial nerves 3-12 appear to be intact, no obvious focal weakness,gait within normal limits no abnormal reflexes or asymmetrical SKIN: No acute rashes normal turgor, color, no bruising or petechiae. PSYCH: Oriented, good eye contact, no obvious depression anxiety, cognition and judgment appear normal. LN: no cervical axillary inguinal adenopathy  Lab Results  Component Value Date   WBC 8.2 04/17/2016   HGB 14.4 04/17/2016   HCT 42.5 04/17/2016   PLT 335.0 04/17/2016   GLUCOSE 93 04/17/2016   CHOL 183 04/17/2016   TRIG 193.0 (H) 04/17/2016   HDL 53.30 04/17/2016   LDLDIRECT 133.8 04/07/2013   LDLCALC 91 04/17/2016   ALT 20 04/17/2016   AST 21 04/17/2016   NA 141 04/17/2016   K 3.7 04/17/2016   CL 102 04/17/2016   CREATININE 0.92 04/17/2016   BUN 14 04/17/2016   CO2 31 04/17/2016   TSH 3.76 04/17/2016    ASSESSMENT AND PLAN:  Discussed the following assessment and plan:  Visit for preventive health examination  High blood triglycerides  Essential hypertension  Need for tetanus booster - Plan: Td vaccine greater than or equal to 7yo preservative free IM, CANCELED: Tdap vaccine greater than or equal to 7yo IM  Need for prophylactic vaccination and inoculation against influenza - Plan: Flu Vaccine QUAD 36+ mos PF IM (Fluarix & Fluzone Quad PF)  Stress due to illness of family member Disc lsi  Avoiding carbs strategies    Gi sx sound like ibs  But if problematic get back with her GI provider Patient Care Team: Burnis Medin, MD as PCP - General (Internal Medicine) Anastasio Auerbach, MD as Attending Physician (Obstetrics and Gynecology) Jarome Matin, MD as Consulting Physician (Dermatology) Thalia Bloodgood, OD as Referring Physician (Optometry) Patient Instructions  Avoid simple  procressed carbs breads chips etc    This can help avoiding getting diabetes and lower your triglycerides.   If having gi problems consider seeing  Your GI doc but could be Irritable bowel problem .    Also consider trying something called the FOD MAP diet .   SeeTennis.com.ee is a good web site  For accurate  Scientific information and advice    Wellness check in a year or as needed.    Food Choices to Lower Your Triglycerides Triglycerides are a type of fat in your blood. High levels of triglycerides can increase the risk of heart disease and stroke. If your triglyceride levels are high, the foods you eat and your eating habits are very important. Choosing the right foods can help lower your triglycerides.  WHAT GENERAL GUIDELINES DO I NEED TO FOLLOW?  Lose weight if you are overweight.   Limit or avoid alcohol.   Fill one half of your plate with vegetables and green salads.   Limit fruit to two servings a day. Choose fruit instead of juice.   Make one fourth of your plate whole grains. Look for the word "whole" as the first word in the ingredient list.  Fill one fourth of your plate with lean protein foods.  Enjoy fatty fish (such as salmon, mackerel, sardines, and tuna) three times a week.   Choose healthy fats.   Limit foods high in starch and sugar.  Eat more home-cooked food and less restaurant, buffet, and fast food.  Limit fried foods.  Cook foods using methods other than frying.  Limit saturated fats.  Check ingredient lists to avoid foods with partially hydrogenated oils (  trans fats)  in them. WHAT FOODS CAN I EAT?  Grains Whole grains, such as whole wheat or whole grain breads, crackers, cereals, and pasta. Unsweetened oatmeal, bulgur, barley, quinoa, or brown rice. Corn or whole wheat flour tortillas.  Vegetables Fresh or frozen vegetables (raw, steamed, roasted, or grilled). Green salads. Fruits All fresh, canned (in natural juice), or frozen fruits. Meat and Other Protein Products Ground beef (85% or leaner), grass-fed beef, or beef trimmed of fat. Skinless chicken or [place]. Ground chicken or [place]. Pork trimmed of fat. All fish and seafood. Eggs. Dried beans, peas, or lentils. Unsalted nuts or seeds. Unsalted canned or dry beans. Dairy Low-fat dairy products, such as skim or 1% milk, 2% or reduced-fat cheeses, low-fat ricotta or cottage cheese, or plain low-fat yogurt. Fats and Oils Tub margarines without trans fats. Light or reduced-fat mayonnaise and salad dressings. Avocado. Safflower, olive, or canola oils. Natural peanut or almond butter. The items listed above may not be a complete list of recommended foods or beverages. Contact your dietitian for more options. WHAT FOODS ARE NOT RECOMMENDED?  Grains White bread. White pasta. White rice. Cornbread. Bagels, pastries, and croissants. Crackers that contain trans fat. Vegetables White potatoes. Corn. Creamed or fried vegetables. Vegetables in a cheese sauce. Fruits Dried fruits. Canned fruit in light or heavy syrup. Fruit juice. Meat and Other Protein Products Fatty cuts of meat. Ribs, chicken wings, bacon, sausage, bologna, salami, chitterlings, fatback, hot dogs, bratwurst, and packaged luncheon meats. Dairy Whole or 2% milk, cream, half-and-half, and cream cheese. Whole-fat or sweetened yogurt. Full-fat cheeses. Nondairy creamers and whipped toppings. Processed cheese, cheese spreads, or cheese curds. Sweets and Desserts Corn syrup, sugars, honey, and molasses. Candy. Jam and jelly. Syrup. Sweetened cereals.  Cookies, pies, cakes, donuts, muffins, and ice cream. Fats and Oils Butter, stick margarine, lard, shortening, ghee, or bacon fat. Coconut, palm kernel, or palm oils. Beverages Alcohol. Sweetened drinks (such as sodas, lemonade, and fruit drinks or punches). The items listed above may not be a complete list of foods and beverages to avoid. Contact your dietitian for more information.   This information is not intended to replace advice given to you by your health care provider. Make sure you discuss any questions you have with your health care provider.   Document Released: 03/26/2004 Document Revised: 06/29/2014 Document Reviewed: 04/12/2013 Elsevier Interactive Patient Education 2016 Wilkin K. Panosh M.D.

## 2016-04-29 ENCOUNTER — Encounter: Payer: Self-pay | Admitting: Internal Medicine

## 2016-04-29 ENCOUNTER — Ambulatory Visit (INDEPENDENT_AMBULATORY_CARE_PROVIDER_SITE_OTHER): Payer: 59 | Admitting: Internal Medicine

## 2016-04-29 VITALS — BP 132/90 | Temp 98.4°F | Ht 66.5 in | Wt 220.1 lb

## 2016-04-29 DIAGNOSIS — Z6379 Other stressful life events affecting family and household: Secondary | ICD-10-CM

## 2016-04-29 DIAGNOSIS — E781 Pure hyperglyceridemia: Secondary | ICD-10-CM | POA: Diagnosis not present

## 2016-04-29 DIAGNOSIS — I10 Essential (primary) hypertension: Secondary | ICD-10-CM | POA: Diagnosis not present

## 2016-04-29 DIAGNOSIS — Z23 Encounter for immunization: Secondary | ICD-10-CM

## 2016-04-29 DIAGNOSIS — Z0000 Encounter for general adult medical examination without abnormal findings: Secondary | ICD-10-CM | POA: Diagnosis not present

## 2016-04-29 NOTE — Patient Instructions (Addendum)
Avoid simple  procressed carbs breads chips etc    This can help avoiding getting diabetes and lower your triglycerides.   If having gi problems consider seeing  Your GI doc but could be Irritable bowel problem .    Also consider trying something called the FOD MAP diet .   SeeTennis.com.ee is a good web site  For accurate  Scientific information and advice    Wellness check in a year or as needed.    Food Choices to Lower Your Triglycerides Triglycerides are a type of fat in your blood. High levels of triglycerides can increase the risk of heart disease and stroke. If your triglyceride levels are high, the foods you eat and your eating habits are very important. Choosing the right foods can help lower your triglycerides.  WHAT GENERAL GUIDELINES DO I NEED TO FOLLOW?  Lose weight if you are overweight.   Limit or avoid alcohol.   Fill one half of your plate with vegetables and green salads.   Limit fruit to two servings a day. Choose fruit instead of juice.   Make one fourth of your plate whole grains. Look for the word "whole" as the first word in the ingredient list.  Fill one fourth of your plate with lean protein foods.  Enjoy fatty fish (such as salmon, mackerel, sardines, and tuna) three times a week.   Choose healthy fats.   Limit foods high in starch and sugar.  Eat more home-cooked food and less restaurant, buffet, and fast food.  Limit fried foods.  Cook foods using methods other than frying.  Limit saturated fats.  Check ingredient lists to avoid foods with partially hydrogenated oils (trans fats) in them. WHAT FOODS CAN I EAT?  Grains Whole grains, such as whole wheat or whole grain breads, crackers, cereals, and pasta. Unsweetened oatmeal, bulgur, barley, quinoa, or brown rice. Corn or whole wheat flour tortillas.  Vegetables Fresh or frozen vegetables (raw, steamed, roasted, or grilled). Green salads. Fruits All fresh, canned (in natural  juice), or frozen fruits. Meat and Other Protein Products Ground beef (85% or leaner), grass-fed beef, or beef trimmed of fat. Skinless chicken or [place]. Ground chicken or [place]. Pork trimmed of fat. All fish and seafood. Eggs. Dried beans, peas, or lentils. Unsalted nuts or seeds. Unsalted canned or dry beans. Dairy Low-fat dairy products, such as skim or 1% milk, 2% or reduced-fat cheeses, low-fat ricotta or cottage cheese, or plain low-fat yogurt. Fats and Oils Tub margarines without trans fats. Light or reduced-fat mayonnaise and salad dressings. Avocado. Safflower, olive, or canola oils. Natural peanut or almond butter. The items listed above may not be a complete list of recommended foods or beverages. Contact your dietitian for more options. WHAT FOODS ARE NOT RECOMMENDED?  Grains White bread. White pasta. White rice. Cornbread. Bagels, pastries, and croissants. Crackers that contain trans fat. Vegetables White potatoes. Corn. Creamed or fried vegetables. Vegetables in a cheese sauce. Fruits Dried fruits. Canned fruit in light or heavy syrup. Fruit juice. Meat and Other Protein Products Fatty cuts of meat. Ribs, chicken wings, bacon, sausage, bologna, salami, chitterlings, fatback, hot dogs, bratwurst, and packaged luncheon meats. Dairy Whole or 2% milk, cream, half-and-half, and cream cheese. Whole-fat or sweetened yogurt. Full-fat cheeses. Nondairy creamers and whipped toppings. Processed cheese, cheese spreads, or cheese curds. Sweets and Desserts Corn syrup, sugars, honey, and molasses. Candy. Jam and jelly. Syrup. Sweetened cereals. Cookies, pies, cakes, donuts, muffins, and ice cream. Fats and Oils Butter, stick margarine,  lard, shortening, ghee, or bacon fat. Coconut, palm kernel, or palm oils. Beverages Alcohol. Sweetened drinks (such as sodas, lemonade, and fruit drinks or punches). The items listed above may not be a complete list of foods and beverages to avoid. Contact  your dietitian for more information.   This information is not intended to replace advice given to you by your health care provider. Make sure you discuss any questions you have with your health care provider.   Document Released: 03/26/2004 Document Revised: 06/29/2014 Document Reviewed: 04/12/2013 Elsevier Interactive Patient Education [employer].

## 2016-05-22 ENCOUNTER — Ambulatory Visit (INDEPENDENT_AMBULATORY_CARE_PROVIDER_SITE_OTHER): Payer: 59 | Admitting: Gynecology

## 2016-05-22 ENCOUNTER — Encounter: Payer: Self-pay | Admitting: Gynecology

## 2016-05-22 VITALS — BP 120/76 | Ht 67.0 in | Wt 219.0 lb

## 2016-05-22 DIAGNOSIS — Z01411 Encounter for gynecological examination (general) (routine) with abnormal findings: Secondary | ICD-10-CM

## 2016-05-22 DIAGNOSIS — K640 First degree hemorrhoids: Secondary | ICD-10-CM

## 2016-05-22 DIAGNOSIS — N952 Postmenopausal atrophic vaginitis: Secondary | ICD-10-CM

## 2016-05-22 NOTE — Progress Notes (Addendum)
    Laura Smith 1960/09/03 [id number]        55 y.o.  G1P1001  for annual exam.    Past medical history,surgical history, problem list, medications, allergies, family history and social history were all reviewed and documented as reviewed in the EPIC chart.  ROS:  Performed with pertinent positives and negatives included in the history, assessment and plan.   Additional significant findings :  None   Exam: Caryn Bee assistant Vitals:   05/22/16 1408  BP: 120/76  Weight: 219 lb (99.3 kg)  Height: 5\' 7"  (1.702 m)   Body mass index is 34.3 kg/m.  General appearance:  Normal affect, orientation and appearance. Skin: Grossly normal HEENT: Without gross lesions.  No cervical or supraclavicular adenopathy. Thyroid normal.  Lungs:  Clear without wheezing, rales or rhonchi Cardiac: RR, without RMG Abdominal:  Soft, nontender, without masses, guarding, rebound, organomegaly or hernia Breasts:  Examined lying and sitting without masses, retractions, discharge or axillary adenopathy. Pelvic:  Ext, BUS, Vagina with atrophic changes  Cervix with atrophic changes  Uterus anteverted, normal size, shape and contour, midline and mobile nontender   Adnexa without masses or tenderness    Anus and perineum with old external hemorrhoid noted   Rectovaginal normal sphincter tone without palpated masses or tenderness.    Assessment/Plan:  55 y.o. G48P1001 female for annual exam.   1. Postmenopausal/atrophic genital changes. No significant hot flushes, isodense, vaginal dryness or any vaginal bleeding. Continue monitor report any issues or bleeding. 2. Hemorrhoids. Patient has old external hemorrhoids. Occasional flares and irritation. Offered option to be evaluated for banding and she declined. Prefers just to monitor and put up with them. 3. Pap smear/HPV 2014 negative. No Pap smear done today. No history of significant abnormal Pap smears. Plan repeat Pap smear at 5 year interval per current  screening guidelines. 4. Colonoscopy 2015. Repeat at their recommended interval. 5. Mammography 07/2015. Continue with annual mammography when due. SBE monthly reviewed. 6. Maintenance. No routine lab work done as patient reports is done elsewhere. Follow up 1 year, sooner as needed.   Anastasio Auerbach MD, 2:26 PM 05/22/2016

## 2016-05-22 NOTE — Patient Instructions (Signed)

## 2016-05-23 LAB — URINALYSIS W MICROSCOPIC + REFLEX CULTURE
Bacteria, UA: NONE SEEN [HPF]
Bilirubin Urine: NEGATIVE
Casts: NONE SEEN [LPF]
Crystals: NONE SEEN [HPF]
Glucose, UA: NEGATIVE
Hgb urine dipstick: NEGATIVE
Ketones, ur: NEGATIVE
Leukocytes, UA: NEGATIVE
Nitrite: NEGATIVE
Protein, ur: NEGATIVE
RBC / HPF: NONE SEEN RBC/HPF (ref <=2)
Specific Gravity, Urine: 1.01 (ref 1.001–1.035)
Squamous Epithelial / LPF: NONE SEEN [HPF] (ref <=5)
WBC, UA: NONE SEEN WBC/HPF (ref <=5)
Yeast: NONE SEEN [HPF]
pH: 6 (ref 5.0–8.0)

## 2016-06-24 DIAGNOSIS — S335XXA Sprain of ligaments of lumbar spine, initial encounter: Secondary | ICD-10-CM | POA: Diagnosis not present

## 2016-07-13 ENCOUNTER — Telehealth: Payer: Self-pay | Admitting: Internal Medicine

## 2016-07-13 NOTE — Telephone Encounter (Signed)
Pt has new pharm [facility] mail order [facility] and needs new rxs  Montelukast,hctz and verapamil #90 w/refills

## 2016-07-14 MED ORDER — HYDROCHLOROTHIAZIDE 25 MG PO TABS: 25.0000 mg | ORAL_TABLET | Freq: Every day | ORAL | 2 refills | Status: DC

## 2016-07-14 MED ORDER — VERAPAMIL HCL ER 180 MG PO TBCR: 180.0000 mg | EXTENDED_RELEASE_TABLET | Freq: Every day | ORAL | 2 refills | Status: DC

## 2016-07-14 MED ORDER — MONTELUKAST SODIUM 10 MG PO TABS: 10.0000 mg | ORAL_TABLET | Freq: Every day | ORAL | 2 refills | Status: DC | PRN

## 2016-07-14 NOTE — Telephone Encounter (Signed)
Sent to the pharmacy by e-scribe.  Pt has upcoming yearly on 05/04/17.

## 2016-08-07 DIAGNOSIS — D1801 Hemangioma of skin and subcutaneous tissue: Secondary | ICD-10-CM | POA: Diagnosis not present

## 2016-08-07 DIAGNOSIS — D225 Melanocytic nevi of trunk: Secondary | ICD-10-CM | POA: Diagnosis not present

## 2016-08-07 DIAGNOSIS — L821 Other seborrheic keratosis: Secondary | ICD-10-CM | POA: Diagnosis not present

## 2016-08-07 DIAGNOSIS — L820 Inflamed seborrheic keratosis: Secondary | ICD-10-CM | POA: Diagnosis not present

## 2016-11-04 ENCOUNTER — Ambulatory Visit (INDEPENDENT_AMBULATORY_CARE_PROVIDER_SITE_OTHER): Payer: 59 | Admitting: Family Medicine

## 2016-11-04 VITALS — BP 120/88 | HR 78 | Temp 98.8°F | Wt 227.4 lb

## 2016-11-04 DIAGNOSIS — W57XXXA Bitten or stung by nonvenomous insect and other nonvenomous arthropods, initial encounter: Secondary | ICD-10-CM

## 2016-11-04 DIAGNOSIS — S70262A Insect bite (nonvenomous), left hip, initial encounter: Secondary | ICD-10-CM | POA: Diagnosis not present

## 2016-11-04 NOTE — Progress Notes (Signed)
Subjective:     Patient ID: Laura Smith, female   DOB: 1960/08/08, 56 y.o.   MRN: [id number]  HPI Patient seen following tick bite last Wednesday. By description, this sounded like a small deer tick. Tick bite location was left hip. She's had some mild itching and redness since then which has progressed somewhat. She's not had any fever or chills. No headaches. No generalize rash.  Past Medical History:  Diagnosis Date  . Allergy   . Endometriosis    surgery laser 2001  . Hemorrhoids   . Hx of basal cell carcinoma   . Hypertension   . Migraine   . Urinary tract infection    Past Surgical History:  Procedure Laterality Date  . [place]  . HYSTEROSCOPY  08/1999   AT TIME OF LAPAROSCOPY  . PELVIC LAPAROSCOPY  08/1999   AND HYSTEROSCOPY--ENDOMETRIOSIS    reports that she has never smoked. She has never used smokeless tobacco. She reports that she does not drink alcohol or use drugs. family history includes Breast cancer in her cousin and mother; Cancer in her paternal grandfather and paternal grandmother; Colon cancer (age of onset: 62) in her mother; Heart disease in her father and paternal grandmother; Hypertension in her paternal grandmother; Stroke in her maternal grandfather and paternal grandmother. Allergies  Allergen Reactions  . Etodolac Hives    Possible anaphylaxis  With vomiting and throat feeling tight  ED visit 8 14   . Latex Hives, Rash and Other (See Comments)    Causes blisters  . Other Hives, Rash and Other (See Comments)    Gel used for ultrasound-causes blisters also   . Adhesive [Tape] Itching and Rash     Review of Systems  Constitutional: Negative for chills and fever.  Neurological: Negative for headaches.       Objective:   Physical Exam  Constitutional: She appears well-developed and well-nourished.  Cardiovascular: Normal rate and regular rhythm.   Skin:  Patient has  small area of erythema about 1/2 cm diameter left lateral hip region. No obvious retained tick parts.       Assessment:     Tick bite with local allergic response.    Plan:     -Reassurance. -Follow-up promptly for any fever or new rash or other concerns -She is aware local itching and redness may take several weeks to fully resolve  Eulas Post MD [facility]

## 2016-11-04 NOTE — Patient Instructions (Signed)
Tick Bite Information, Adult Ticks are insects that draw blood for food. Most ticks live in shrubs and grassy areas. They climb onto people and animals that brush against the leaves and grasses that they rest on. Then they bite, attaching themselves to the skin. Most ticks are harmless, but some ticks carry germs that can spread to a person through a bite and cause a disease. To reduce your risk of getting a disease from a tick bite, it is important to take steps to prevent tick bites. It is also important to check for ticks after being outdoors. If you find that a tick has attached to you, watch for symptoms of disease. How can I prevent tick bites? Take these steps to help prevent tick bites when you are outdoors in an area where ticks are found:  Use insect repellent that has DEET (20% or higher), picaridin, or IR3535 in it. Use it on:  Skin that is showing.  The top of your boots.  Your pant legs.  Your sleeve cuffs.  For repellent products that contain permethrin, follow product instructions. Use these products on:  Clothing.  Gear.  Boots.  Tents.  Wear protective clothing. Long sleeves and long pants offer the best protection from ticks.  Wear light-colored clothing so you can see ticks more easily.  Tuck your pant legs into your socks.  If you go walking on a trail, stay in the middle of the trail so your skin, hair, and clothing do not touch the bushes.  Avoid walking through areas with long grass.  Check for ticks on your clothing, hair, and skin often while you are outside, and check again before you go inside. Make sure to check the places that ticks attach themselves most often. These places include the scalp, neck, armpits, waist, groin, and joint areas. Ticks that carry a disease called Lyme disease have to be attached to the skin for 24-48 hours. Checking for ticks every day will lessen your risk of this and other diseases.  When you come indoors, wash your  clothes and take a shower or a bath right away. Dry your clothes in a dryer on high heat for at least 60 minutes. This will kill any ticks in your clothes. What is the proper way to remove a tick? If you find a tick on your body, remove it as soon as possible. Removing a tick sooner rather than later can prevent germs from passing from the tick to your body. To remove a tick that is crawling on your skin but has not bitten:  Go outdoors and brush the tick off.  Remove the tick with tape or a lint roller. To remove a tick that is attached to your skin:  Wash your hands.  If you have latex gloves, put them on.  Use tweezers, curved forceps, or a tick-removal tool to gently grasp the tick as close to your skin and the tick's head as possible.  Gently pull with steady, upward pressure until the tick lets go. When removing the tick:  Take care to keep the tick's head attached to its body.  Do not twist or jerk the tick. This can make the tick's head or mouth break off.  Do not squeeze or crush the tick's body. This could force disease-carrying fluids from the tick into your body. Do not try to remove a tick with heat, alcohol, petroleum jelly, or fingernail polish. Using these methods can cause the tick to salivate and regurgitate into your  bloodstream, increasing your risk of getting a disease. What should I do after removing a tick?  Clean the bite area with soap and water, rubbing alcohol, or an iodine scrub.  If an antiseptic cream or ointment is available, apply a small amount to the bite site.  Wash and disinfect any instruments that you used to remove the tick. How should I dispose of a tick? To dispose of a live tick, use one of these methods:  Place it in rubbing alcohol.  Place it in a sealed bag or container.  Wrap it tightly in tape.  Flush it down the toilet. Contact a health care provider if:  You have symptoms of a disease after a tick bite. Symptoms of a  tick-borne disease can occur from moments after the tick bites to up to 30 days after a tick is removed. Symptoms include:  Muscle, joint, or bone pain.  Difficulty walking or moving your legs.  Numbness in the legs.  Paralysis.  Red rash around the tick bite area that is shaped like a target or a "bull's-eye."  Redness and swelling in the area of the tick bite.  Fever.  Repeated vomiting.  Diarrhea.  Weight loss.  Tender, swollen lymph glands.  Shortness of breath.  Cough.  Pain in the abdomen.  Headache.  Abnormal tiredness.  A change in your level of consciousness.  Confusion. Get help right away if:  You are not able to remove a tick.  A part of a tick breaks off and gets stuck in your skin.  Your symptoms get worse. Summary  Ticks may carry germs that can spread to a person through a bite and cause disease.  Wear protective clothing and use insect repellent to prevent tick bites. Follow product instructions.  If you find a tick on your body, remove it as soon as possible. If the tick is attached, do not try to remove with heat, alcohol, petroleum jelly, or fingernail polish.  Remove the attached tick using tweezers, curved forceps, or a tick-removal tool. Gently pull with steady, upward pressure until the tick lets go. Do not twist or jerk the tick. Do not squeeze or crush the tick's body.  If you have symptoms after being bitten by a tick, contact a health care provider. This information is not intended to replace advice given to you by your health care provider. Make sure you discuss any questions you have with your health care provider. Document Released: 06/05/2000 Document Revised: 03/20/2016 Document Reviewed: 03/20/2016 Elsevier Interactive Patient Education  2017 [employer].

## 2016-11-27 ENCOUNTER — Encounter: Payer: Self-pay | Admitting: Gynecology

## 2016-11-27 DIAGNOSIS — Z1231 Encounter for screening mammogram for malignant neoplasm of breast: Secondary | ICD-10-CM | POA: Diagnosis not present

## 2016-11-27 DIAGNOSIS — Z803 Family history of malignant neoplasm of breast: Secondary | ICD-10-CM | POA: Diagnosis not present

## 2017-01-13 ENCOUNTER — Telehealth: Payer: Self-pay | Admitting: Internal Medicine

## 2017-01-13 ENCOUNTER — Other Ambulatory Visit: Payer: Self-pay | Admitting: Emergency Medicine

## 2017-01-13 MED ORDER — MECLIZINE HCL 25 MG PO TABS: ORAL_TABLET | ORAL | 0 refills | Status: DC

## 2017-01-13 NOTE — Telephone Encounter (Signed)
° ° ° °  Pt said [place] and she to have the below med refill   meclizine (ANTIVERT) 25 MG tablet  [facility]

## 2017-01-13 NOTE — Telephone Encounter (Signed)
Rx was refilled 04/15/2016. Please advise.

## 2017-01-13 NOTE — Telephone Encounter (Signed)
Rx has been sent to the pharmacy

## 2017-01-13 NOTE — Telephone Encounter (Signed)
Ok to refill x 1  

## 2017-01-25 DIAGNOSIS — M545 Low back pain: Secondary | ICD-10-CM | POA: Diagnosis not present

## 2017-02-11 NOTE — Progress Notes (Signed)
Chief Complaint  Patient presents with  . Hematuria  . Vaginal Itching    HPI: Laura Smith 56 y.o.  sda abnormal   Color urine .   Orange like  ?    Went to ed . In past when had blood and associated kidney infection and needed antibiotics. Minor vaginal irritation with odor may be She notices blood when she wipes urinary area. Some back pain but no flank pain nausea vomiting or history of kidney stones.  ROS: See pertinent positives and negatives per HPI.  Past Medical History:  Diagnosis Date  . Allergy   . Endometriosis    surgery laser 2001  . Hemorrhoids   . Hx of basal cell carcinoma   . Hypertension   . Migraine   . Urinary tract infection     Family History  Problem Relation Age of Onset  . Breast cancer Mother        Age 49  . Colon cancer Mother 65  . Breast cancer Cousin        Age 54's  . Heart disease Father   . Hypertension Paternal Grandmother   . Heart disease Paternal Grandmother   . Stroke Paternal Grandmother   . Cancer Paternal Grandmother        Lung  . Cancer Paternal Grandfather        Prostate  . Stroke Maternal Grandfather     Social History   Social History  . Marital status: Married    Spouse name: N/A  . Number of children: N/A  . Years of education: N/A   Social History Main Topics  . Smoking status: Never Smoker  . Smokeless tobacco: Never Used  . Alcohol use No  . Drug use: No  . Sexual activity: Not Currently    Birth control/ protection: Post-menopausal     Comment: Pt declined sexual hx questions   Other Topics Concern  . None   Social History Narrative   h h of 2 Pet cat   Daughter /  Married    g1 p1   [employer]     Outpatient Medications Prior to Visit  Medication Sig Dispense Refill  . Ascorbic Acid (VITAMIN C) 500 MG CAPS Take 1,000 mg by mouth every morning.     Marland Kitchen b complex vitamins tablet Take 2 tablets by mouth daily.     Marland Kitchen CALCIUM-MAGNESIUM PO Take 3  tablets by mouth daily.     . cetirizine (ZYRTEC) 10 MG tablet Take 10 mg by mouth daily.    . hydrochlorothiazide (HYDRODIURIL) 25 MG tablet Take 1 tablet (25 mg total) by mouth daily. 90 tablet 2  . HYDROcodone-acetaminophen (NORCO) 7.5-325 MG per tablet Take 1-2 tablets by mouth every 4 (four) hours as needed for pain (as needed for migraine). Reported on 09/23/2015    . HYDROmorphone (DILAUDID) 4 MG tablet Take 4-8 mg by mouth every 4 (four) hours as needed for pain (rescure for migraine). Reported on 09/23/2015    . meclizine (ANTIVERT) 25 MG tablet TAKE 2 TABLET (50 MG TOTAL) BY MOUTH 2 (TWO) TIMES DAILY AS NEEDED (FOR TRAVEL). 30 tablet 0  . montelukast (SINGULAIR) 10 MG tablet Take 1 tablet (10 mg total) by mouth daily as needed (for seasonal allergies, uses as needed). 90 tablet 2  . Multiple Vitamins-Iron (MULTIVITAMIN/IRON) TABS Take 1 tablet by mouth daily.     . sulindac (CLINORIL) 200 MG tablet Take  200 mg by mouth 2 (two) times daily as needed (headache). Reported on 09/23/2015    . SUMAtriptan (IMITREX) 6 MG/0.5ML SOLN injection Inject 6 mg into the skin every 2 (two) hours as needed for migraine. Reported on 09/23/2015    . verapamil (CALAN-SR) 180 MG CR tablet Take 1 tablet (180 mg total) by mouth at bedtime. 90 tablet 2  . ZOLMitriptan (ZOMIG NA) Place 1 spray into the nose daily as needed (for migraines). Reported on 09/23/2015     No facility-administered medications prior to visit.      EXAM:  BP 120/80 (BP Location: Right Arm, Patient Position: Sitting, Cuff Size: Large)   Pulse 80   Temp 98.3 F (36.8 C) (Oral)   Wt 223 lb (101.2 kg)   BMI 34.93 kg/m   Body mass index is 34.93 kg/m.  GENERAL: vitals reviewed and listed above, alert, oriented, appears well hydrated and in no acute distress HEENT: atraumatic, conjunctiva  clear, no obvious abnormalities on inspection of external nose and ears Abdomen:  Sof,t normal bowel sounds without hepatosplenomegaly, no guarding  rebound or masses no CVA tenderness Mild bilateral low back pain MS: moves all extremities without noticeable focal  abnormality PSYCH: pleasant and cooperative, no obvious depression or anxiety ua pos blood neg leuk  Sent for cx and  micro ASSESSMENT AND PLAN:  Discussed the following assessment and plan:  Hematuria, unspecified type - by dx and pt report id infection cause rx over weekend plan fu if persistent progressive without cause. - Plan: POCT Urinalysis Dipstick (Automated), Urine Culture, Urine Microscopic Only, CANCELED: Urine Microscopic Only, CANCELED: Urine Culture   Expectant management.  -Patient advised to return or notify health care team  if symptoms worsen ,persist or new concerns arise.  Patient Instructions  wther is some blood inyour urine     Checking for infection . Sending for culture tests.   Over the weekend begin antibiotic    And  If not better plan follow up    Hematuria, Adult Hematuria is blood in your urine. It can be caused by a bladder infection, kidney infection, prostate infection, kidney stone, or cancer of your urinary tract. Infections can usually be treated with medicine, and a kidney stone usually will pass through your urine. If neither of these is the cause of your hematuria, further workup to find out the reason may be needed. It is very important that you tell your health care provider about any blood you see in your urine, even if the blood stops without treatment or happens without causing pain. Blood in your urine that happens and then stops and then happens again can be a symptom of a very serious condition. Also, pain is not a symptom in the initial stages of many urinary cancers. Follow these instructions at home:  Drink lots of fluid, 3-4 quarts a day. If you have been diagnosed with an infection, cranberry juice is especially recommended, in addition to large amounts of water.  Avoid caffeine, tea, and carbonated beverages because they  tend to irritate the bladder.  Avoid alcohol because it may irritate the prostate.  Take all medicines as directed by your health care provider.  If you were prescribed an antibiotic medicine, finish it all even if you start to feel better.  If you have been diagnosed with a kidney stone, follow your health care provider's instructions regarding straining your urine to catch the stone.  Empty your bladder often. Avoid holding urine for long periods of  time.  After a bowel movement, women should cleanse front to back. Use each tissue only once.  Empty your bladder before and after sexual intercourse if you are a female. Contact a health care provider if:  You develop back pain.  You have a fever.  You have a feeling of sickness in your stomach (nausea) or vomiting.  Your symptoms are not better in 3 days. Return sooner if you are getting worse. Get help right away if:  You develop severe vomiting and are unable to keep the medicine down.  You develop severe back or abdominal pain despite taking your medicines.  You begin passing a large amount of blood or clots in your urine.  You feel extremely weak or faint, or you pass out. This information is not intended to replace advice given to you by your health care provider. Make sure you discuss any questions you have with your health care provider. Document Released: 06/08/2005 Document Revised: 11/14/2015 Document Reviewed: 02/06/2013 Elsevier Interactive Patient Education  2017 [place] K. Panosh M.D.

## 2017-02-12 ENCOUNTER — Ambulatory Visit (INDEPENDENT_AMBULATORY_CARE_PROVIDER_SITE_OTHER): Payer: 59 | Admitting: Internal Medicine

## 2017-02-12 ENCOUNTER — Encounter: Payer: Self-pay | Admitting: Internal Medicine

## 2017-02-12 VITALS — BP 120/80 | HR 80 | Temp 98.3°F | Wt 223.0 lb

## 2017-02-12 DIAGNOSIS — R319 Hematuria, unspecified: Secondary | ICD-10-CM | POA: Diagnosis not present

## 2017-02-12 LAB — POC URINALSYSI DIPSTICK (AUTOMATED)
Bilirubin, UA: NEGATIVE
Glucose, UA: NEGATIVE
Ketones, UA: NEGATIVE
Leukocytes, UA: NEGATIVE
Nitrite, UA: NEGATIVE
Protein, UA: NEGATIVE
Spec Grav, UA: 1.025 (ref 1.010–1.025)
Urobilinogen, UA: 0.2 E.U./dL
pH, UA: 6 (ref 5.0–8.0)

## 2017-02-12 LAB — URINALYSIS, MICROSCOPIC ONLY

## 2017-02-12 MED ORDER — NITROFURANTOIN MONOHYD MACRO 100 MG PO CAPS: 100.0000 mg | ORAL_CAPSULE | Freq: Two times a day (BID) | ORAL | 0 refills | Status: AC

## 2017-02-12 NOTE — Patient Instructions (Addendum)
wther is some blood inyour urine     Checking for infection . Sending for culture tests.   Over the weekend begin antibiotic    And  If not better plan follow up    Hematuria, Adult Hematuria is blood in your urine. It can be caused by a bladder infection, kidney infection, prostate infection, kidney stone, or cancer of your urinary tract. Infections can usually be treated with medicine, and a kidney stone usually will pass through your urine. If neither of these is the cause of your hematuria, further workup to find out the reason may be needed. It is very important that you tell your health care provider about any blood you see in your urine, even if the blood stops without treatment or happens without causing pain. Blood in your urine that happens and then stops and then happens again can be a symptom of a very serious condition. Also, pain is not a symptom in the initial stages of many urinary cancers. Follow these instructions at home:  Drink lots of fluid, 3-4 quarts a day. If you have been diagnosed with an infection, cranberry juice is especially recommended, in addition to large amounts of water.  Avoid caffeine, tea, and carbonated beverages because they tend to irritate the bladder.  Avoid alcohol because it may irritate the prostate.  Take all medicines as directed by your health care provider.  If you were prescribed an antibiotic medicine, finish it all even if you start to feel better.  If you have been diagnosed with a kidney stone, follow your health care provider's instructions regarding straining your urine to catch the stone.  Empty your bladder often. Avoid holding urine for long periods of time.  After a bowel movement, women should cleanse front to back. Use each tissue only once.  Empty your bladder before and after sexual intercourse if you are a female. Contact a health care provider if:  You develop back pain.  You have a fever.  You have a feeling of  sickness in your stomach (nausea) or vomiting.  Your symptoms are not better in 3 days. Return sooner if you are getting worse. Get help right away if:  You develop severe vomiting and are unable to keep the medicine down.  You develop severe back or abdominal pain despite taking your medicines.  You begin passing a large amount of blood or clots in your urine.  You feel extremely weak or faint, or you pass out. This information is not intended to replace advice given to you by your health care provider. Make sure you discuss any questions you have with your health care provider. Document Released: 06/08/2005 Document Revised: 11/14/2015 Document Reviewed: 02/06/2013 Elsevier Interactive Patient Education  2017 [employer].

## 2017-02-13 LAB — URINE CULTURE

## 2017-02-14 NOTE — Progress Notes (Signed)
urine culture showed multiple bacteria but not predominant germ. Cannot tell if she has  UTI.   Take the antibiotic  And plan  Testing if symptoms continue  The urine microscopic  Did not show blood  So not sure about the  Color change.

## 2017-03-12 ENCOUNTER — Encounter: Payer: Self-pay | Admitting: Internal Medicine

## 2017-04-05 ENCOUNTER — Observation Stay (HOSPITAL_COMMUNITY)
Admission: EM | Admit: 2017-04-05 | Discharge: 2017-04-06 | Disposition: A | Discharge status: Home / Self Care | Payer: 59 | Attending: Internal Medicine | Admitting: Internal Medicine

## 2017-04-05 ENCOUNTER — Emergency Department (HOSPITAL_COMMUNITY): Payer: 59

## 2017-04-05 ENCOUNTER — Encounter (HOSPITAL_COMMUNITY): Payer: Self-pay | Admitting: Emergency Medicine

## 2017-04-05 DIAGNOSIS — Z8669 Personal history of other diseases of the nervous system and sense organs: Secondary | ICD-10-CM | POA: Diagnosis not present

## 2017-04-05 DIAGNOSIS — J309 Allergic rhinitis, unspecified: Secondary | ICD-10-CM | POA: Diagnosis present

## 2017-04-05 DIAGNOSIS — Z8249 Family history of ischemic heart disease and other diseases of the circulatory system: Secondary | ICD-10-CM | POA: Insufficient documentation

## 2017-04-05 DIAGNOSIS — I10 Essential (primary) hypertension: Secondary | ICD-10-CM | POA: Diagnosis not present

## 2017-04-05 DIAGNOSIS — E669 Obesity, unspecified: Secondary | ICD-10-CM | POA: Insufficient documentation

## 2017-04-05 DIAGNOSIS — Z23 Encounter for immunization: Secondary | ICD-10-CM | POA: Diagnosis not present

## 2017-04-05 DIAGNOSIS — Z79899 Other long term (current) drug therapy: Secondary | ICD-10-CM | POA: Diagnosis not present

## 2017-04-05 DIAGNOSIS — Z85828 Personal history of other malignant neoplasm of skin: Secondary | ICD-10-CM | POA: Insufficient documentation

## 2017-04-05 DIAGNOSIS — I2089 Other forms of angina pectoris: Secondary | ICD-10-CM | POA: Diagnosis present

## 2017-04-05 DIAGNOSIS — Z6833 Body mass index (BMI) 33.0-33.9, adult: Secondary | ICD-10-CM | POA: Diagnosis not present

## 2017-04-05 DIAGNOSIS — I208 Other forms of angina pectoris: Secondary | ICD-10-CM | POA: Diagnosis present

## 2017-04-05 DIAGNOSIS — R079 Chest pain, unspecified: Principal | ICD-10-CM | POA: Insufficient documentation

## 2017-04-05 DIAGNOSIS — G43909 Migraine, unspecified, not intractable, without status migrainosus: Secondary | ICD-10-CM | POA: Diagnosis present

## 2017-04-05 LAB — BASIC METABOLIC PANEL
Anion gap: 10 (ref 5–15)
BUN: 16 mg/dL (ref 6–20)
CO2: 28 mmol/L (ref 22–32)
Calcium: 9.4 mg/dL (ref 8.9–10.3)
Chloride: 102 mmol/L (ref 101–111)
Creatinine, Ser: 0.85 mg/dL (ref 0.44–1.00)
GFR calc Af Amer: >60 mL/min (ref >60)
GFR calc non Af Amer: >60 mL/min (ref >60)
Glucose, Bld: 103 mg/dL — ABNORMAL HIGH (ref 65–99)
Potassium: 3.5 mmol/L (ref 3.5–5.1)
Sodium: 140 mmol/L (ref 135–145)

## 2017-04-05 LAB — CBC
HCT: 43.6 % (ref 36.0–46.0)
Hemoglobin: 14.5 g/dL (ref 12.0–15.0)
MCH: 29.3 pg (ref 26.0–34.0)
MCHC: 33.3 g/dL (ref 30.0–36.0)
MCV: 88.1 fL (ref 78.0–100.0)
Platelets: 340 10*3/uL (ref 150–400)
RBC: 4.95 MIL/uL (ref 3.87–5.11)
RDW: 14 % (ref 11.5–15.5)
WBC: 9.1 10*3/uL (ref 4.0–10.5)

## 2017-04-05 LAB — POCT I-STAT TROPONIN I: Troponin i, poc: 0 ng/mL (ref 0.00–0.08)

## 2017-04-05 MED ORDER — ACETAMINOPHEN 500 MG PO TABS
1000.0000 mg | ORAL_TABLET | Freq: Once | ORAL | Status: AC
  Administered 2017-04-06: 1000 mg via ORAL
  Filled 2017-04-05: qty 2

## 2017-04-05 MED ORDER — NITROGLYCERIN 2 % TD OINT
1.0000 [in_us] | TOPICAL_OINTMENT | Freq: Four times a day (QID) | TRANSDERMAL | Status: DC
  Administered 2017-04-06: 1 [in_us] via TOPICAL
  Filled 2017-04-05: qty 30
  Filled 2017-04-05: qty 1

## 2017-04-05 MED ORDER — MORPHINE SULFATE (PF) 4 MG/ML IV SOLN: 4.0000 mg | INTRAVENOUS | Status: DC | PRN

## 2017-04-05 MED ORDER — NITROGLYCERIN 0.4 MG SL SUBL
0.4000 mg | SUBLINGUAL_TABLET | SUBLINGUAL | Status: DC | PRN
Start: 2017-04-05 — End: 2017-04-06
  Administered 2017-04-05: 0.4 mg via SUBLINGUAL
  Filled 2017-04-05: qty 1

## 2017-04-05 MED ORDER — ASPIRIN 325 MG PO TABS
325.0000 mg | ORAL_TABLET | Freq: Once | ORAL | Status: AC
  Administered 2017-04-06: 325 mg via ORAL
  Filled 2017-04-05: qty 1

## 2017-04-05 NOTE — ED Triage Notes (Signed)
Pt states she has been having chest pain and shortness of breath since Friday  Pt states the pain started off as sharp pain that she would have on exertion and now it is like a pressure  Pt states she has shortness of breath on exertion   Pt states today she started having numbness in her left arm

## 2017-04-05 NOTE — ED Provider Notes (Signed)
[facility] DEPT Provider Note   CSN: [id number] Arrival date & time: 04/05/17  1936     History   Chief Complaint Chief Complaint  Patient presents with  . Chest Pain  . Shortness of Breath    HPI Laura Smith is a 56 y.o. female.  HPI  56 y.o. female with a hx of HTN, presents to the Emergency Department today due to chest pain and shortness of breath since Friday. Pt states that the pain started off as a sharp pain that she would have with exertion, but now states that it feels like a pressure. Notes left arm numbness as well. No N/V. No diaphoresis. No cough or URI symptoms. Pt denies pain, but states it is just pressure in her chest with continued left arm radiation. No meds PTA. No headaches. No visual changes. Pt with no known hx ACS. Noted FH with father at 4. Risk factors HTN, Obesity. No hx DVT/PE. No recent travel. No recent surgeries. No other symptoms noted.   Past Medical History:  Diagnosis Date  . Allergy   . Endometriosis    surgery laser 2001  . Hemorrhoids   . Hx of basal cell carcinoma   . Hypertension   . Migraine   . Urinary tract infection     Patient Active Problem List   Diagnosis Date Noted  . Seasonal allergies 04/13/2014  . Fasting hyperglycemia 04/13/2014  . Skin lesion 04/13/2014  . Pain of left heel 11/21/2013  . Visit for preventive health examination 04/11/2013  . HTN (hypertension) 04/11/2013  . Hx of dizziness 04/11/2013  . Interstitial cystitis 09/15/2012  . Back pain 09/15/2012  . Allergic rhinitis, cause unspecified 07/10/2012  . Edema of both legs 07/10/2012  . BMI 34.0-34.9,adult 07/10/2012  . Frequent stools 07/10/2012  . Hypertension   . Endometriosis   . Hemorrhoids   . IC (interstitial cystitis)   . Migraine     Past Surgical History:  Procedure Laterality Date  . [place]  . HYSTEROSCOPY  08/1999   AT TIME  OF LAPAROSCOPY  . PELVIC LAPAROSCOPY  08/1999   AND HYSTEROSCOPY--ENDOMETRIOSIS    OB History    Gravida Para Term Preterm AB Living   1 1 1     1    SAB TAB Ectopic Multiple Live Births                   Home Medications    Prior to Admission medications   Medication Sig Start Date End Date Taking? Authorizing Provider  Ascorbic Acid (VITAMIN C) 500 MG CAPS Take 1,000 mg by mouth every morning.     [provider]  b complex vitamins tablet Take 2 tablets by mouth daily.     [provider]  CALCIUM-MAGNESIUM PO Take 3 tablets by mouth daily.     [provider]  cetirizine (ZYRTEC) 10 MG tablet Take 10 mg by mouth daily.    [provider]  cyclobenzaprine (FLEXERIL) 10 MG tablet  01/25/17   [provider]  hydrochlorothiazide (HYDRODIURIL) 25 MG tablet Take 1 tablet (25 mg total) by mouth daily. 07/14/16   Panosh, [name], MD  HYDROcodone-acetaminophen (NORCO) 7.5-325 MG per tablet Take 1-2 tablets by mouth every 4 (four) hours as needed for pain (as needed for migraine). Reported on 09/23/2015    Michel Santee, MD  HYDROmorphone (DILAUDID) 4  MG tablet Take 4-8 mg by mouth every 4 (four) hours as needed for pain (rescure for migraine). Reported on 09/23/2015    Michel Santee, MD  meclizine (ANTIVERT) 25 MG tablet TAKE 2 TABLET (50 MG TOTAL) BY MOUTH 2 (TWO) TIMES DAILY AS NEEDED (FOR TRAVEL). 01/13/17   Panosh, [name], MD  montelukast (SINGULAIR) 10 MG tablet Take 1 tablet (10 mg total) by mouth daily as needed (for seasonal allergies, uses as needed). 07/14/16   Panosh, [name], MD  Multiple Vitamins-Iron (MULTIVITAMIN/IRON) TABS Take 1 tablet by mouth daily.     [provider]  sulindac (CLINORIL) 200 MG tablet Take 200 mg by mouth 2 (two) times daily as needed (headache). Reported on 09/23/2015    [provider]  SUMAtriptan (IMITREX) 6 MG/0.5ML SOLN injection Inject 6 mg into the skin every 2 (two) hours as needed for migraine.  Reported on 09/23/2015    [provider]  traMADol Veatrice Bourbon) 50 MG tablet  01/19/17   [provider]  verapamil (CALAN-SR) 180 MG CR tablet Take 1 tablet (180 mg total) by mouth at bedtime. 07/14/16   Panosh, [name], MD  ZOLMitriptan (ZOMIG NA) Place 1 spray into the nose daily as needed (for migraines). Reported on 09/23/2015    [provider]    Family History Family History  Problem Relation Age of Onset  . Breast cancer Mother        Age 49  . Colon cancer Mother 62  . Breast cancer Cousin        Age 58's  . Heart disease Father   . Hypertension Paternal Grandmother   . Heart disease Paternal Grandmother   . Stroke Paternal Grandmother   . Cancer Paternal Grandmother        Lung  . Cancer Paternal Grandfather        Prostate  . Stroke Maternal Grandfather     Social History Social History  Substance Use Topics  . Smoking status: Never Smoker  . Smokeless tobacco: Never Used  . Alcohol use No     Allergies   Etodolac; Latex; Other; and Adhesive [tape]   Review of Systems Review of Systems ROS reviewed and all are negative for acute change except as noted in the HPI.  Physical Exam Updated Vital Signs BP 135/71 (BP Location: Right Arm)   Pulse 79   Temp 97.7 F (36.5 C) (Oral)   Resp 15   Ht 5' 7.5" (1.715 m)   Wt 98.4 kg (217 lb)   SpO2 99%   BMI 33.49 kg/m   Physical Exam  Constitutional: She is oriented to person, place, and time. She appears well-developed and well-nourished. No distress.  HENT:  Head: Normocephalic and atraumatic.  Right Ear: Tympanic membrane, external ear and ear canal normal.  Left Ear: Tympanic membrane, external ear and ear canal normal.  Nose: Nose normal.  Mouth/Throat: Uvula is midline, oropharynx is clear and moist and mucous membranes are normal. No trismus in the jaw. No oropharyngeal exudate, posterior oropharyngeal erythema or tonsillar abscesses.  Eyes: Pupils are equal, round, and reactive to  light. EOM are normal.  Neck: Normal range of motion. Neck supple. No tracheal deviation present.  Cardiovascular: Normal rate, regular rhythm, S1 normal, S2 normal, normal heart sounds, intact distal pulses and normal pulses.   Pulmonary/Chest: Effort normal and breath sounds normal. No respiratory distress. She has no decreased breath sounds. She has no wheezes. She has no rhonchi. She has no rales.  Abdominal: Normal appearance and bowel sounds are normal. There is no tenderness.  Musculoskeletal: Normal range of motion.  Neurological: She is alert and oriented to person, place, and time.  Skin: Skin is warm and dry.  Psychiatric: She has a normal mood and affect. Her speech is normal and behavior is normal. Thought content normal.     ED Treatments / Results  Labs (all labs ordered are listed, but only abnormal results are displayed) Labs Reviewed  BASIC METABOLIC PANEL - Abnormal; Notable for the following:       Result Value   Glucose, Bld 103 (*)    All other components within normal limits  CBC  I-STAT TROPONIN, ED  POCT I-STAT TROPONIN I    EKG  EKG Interpretation  Date/Time:  Monday April 05 2017 19:49:01 EDT Ventricular Rate:  82 PR Interval:    QRS Duration: 114 QT Interval:  406 QTC Calculation: 475 R Axis:   18 Text Interpretation:  Sinus rhythm Borderline intraventricular conduction delay RSR' in V1 or V2, right VCD or RVH Nonspecific T abnormalities, lateral leads Baseline wander in lead(s) II III aVF When compared with ECG of 11/20/2012, No significant change was found Confirmed by Delora Fuel ([id number]) on 04/05/2017 7:52:27 PM       Radiology Dg Chest 2 View  Result Date: 04/05/2017 CLINICAL DATA:  Chest pain EXAM: CHEST  2 VIEW COMPARISON:  11/15/2015 FINDINGS: The heart size and mediastinal contours are within normal limits. Both lungs are clear. The visualized skeletal structures are unremarkable. IMPRESSION: No active cardiopulmonary disease.  Electronically Signed   By: Donavan Foil M.D.   On: 04/05/2017 20:32    Procedures Procedures (including critical care time)  Medications Ordered in ED Medications  nitroGLYCERIN (NITROSTAT) SL tablet 0.4 mg (not administered)     Initial Impression / Assessment and Plan / ED Course  I have reviewed the triage vital signs and the nursing notes.  Pertinent labs & imaging results that were available during my care of the patient were reviewed by me and considered in my medical decision making (see chart for details).  Final Clinical Impressions(s) / ED Diagnoses  {I have reviewed and evaluated the relevant laboratory values. {I have reviewed and evaluated the relevant imaging studies. {I have interpreted the relevant EKG. {I have reviewed the relevant previous healthcare records.  {I obtained HPI from historian. {Patient discussed with supervising physician.  ED Course:  Assessment: Pt is a 56 y.o. female  with a hx of HTN, presents to the Emergency Department today due to chest pain and shortness of breath since Friday. Pt states that the pain started off as a sharp pain that she would have with exertion, but now states that it feels like a pressure. Notes left arm numbness as well. No N/V. No diaphoresis. No cough or URI symptoms. Pt denies pain, but states it is just pressure in her chest with continued left arm radiation. No meds PTA. No headaches. No visual changes. Pt with no known hx ACS. Noted FH with father at 77. Risk factors HTN, Obesity. No hx DVT/PE. No recent travel. No recent surgeries. Concern for cardiac etiology of Chest Pain. Medicine has been consulted and will see patient in the ED for likely admit. Heart Score 5. Pt does not meet criteria for CP protocol and a further evaluation is recommended. Pt has been re-evaluated prior to consult and VSS, NAD, heart RRR, pain 0/10, lungs CTAB. No acute abnormalities found on EKG and first round  of cardiac enzymes negative.    Disposition/Plan:  Admit Pt acknowledges and agrees with plan  Supervising Physician Milton Ferguson, MD  Final diagnoses:  Chest pain, unspecified type    New Prescriptions New Prescriptions   No medications on file       Shary Decamp, Hershal Coria 04/05/17 2308    Milton Ferguson, MD 04/05/17 2311

## 2017-04-06 ENCOUNTER — Ambulatory Visit (HOSPITAL_COMMUNITY)
Admit: 2017-04-06 | Discharge: 2017-04-06 | Disposition: A | Discharge status: Home / Self Care | Payer: 59 | Attending: Cardiovascular Disease | Admitting: Cardiovascular Disease

## 2017-04-06 ENCOUNTER — Observation Stay (HOSPITAL_COMMUNITY): Payer: 59

## 2017-04-06 DIAGNOSIS — R071 Chest pain on breathing: Secondary | ICD-10-CM | POA: Diagnosis not present

## 2017-04-06 DIAGNOSIS — R079 Chest pain, unspecified: Secondary | ICD-10-CM

## 2017-04-06 DIAGNOSIS — G43809 Other migraine, not intractable, without status migrainosus: Secondary | ICD-10-CM

## 2017-04-06 DIAGNOSIS — R0602 Shortness of breath: Secondary | ICD-10-CM | POA: Diagnosis not present

## 2017-04-06 DIAGNOSIS — I10 Essential (primary) hypertension: Secondary | ICD-10-CM | POA: Diagnosis not present

## 2017-04-06 DIAGNOSIS — I208 Other forms of angina pectoris: Secondary | ICD-10-CM

## 2017-04-06 DIAGNOSIS — J309 Allergic rhinitis, unspecified: Secondary | ICD-10-CM | POA: Diagnosis not present

## 2017-04-06 LAB — CBC
HCT: 37.8 % (ref 36.0–46.0)
Hemoglobin: 12.6 g/dL (ref 12.0–15.0)
MCH: 29 pg (ref 26.0–34.0)
MCHC: 33.3 g/dL (ref 30.0–36.0)
MCV: 87.1 fL (ref 78.0–100.0)
Platelets: 282 10*3/uL (ref 150–400)
RBC: 4.34 MIL/uL (ref 3.87–5.11)
RDW: 14.1 % (ref 11.5–15.5)
WBC: 8.6 10*3/uL (ref 4.0–10.5)

## 2017-04-06 LAB — TROPONIN I
Troponin I: <0.03 ng/mL (ref <0.03)
Troponin I: <0.03 ng/mL (ref <0.03)
Troponin I: <0.03 ng/mL (ref <0.03)

## 2017-04-06 LAB — HIV ANTIBODY (ROUTINE TESTING W REFLEX): HIV Screen 4th Generation wRfx: NONREACTIVE

## 2017-04-06 LAB — CREATININE, SERUM
Creatinine, Ser: 0.8 mg/dL (ref 0.44–1.00)
GFR calc Af Amer: >60 mL/min (ref >60)
GFR calc non Af Amer: >60 mL/min (ref >60)

## 2017-04-06 LAB — ECHOCARDIOGRAM COMPLETE
Height: 68 in
Weight: 3573.22 oz

## 2017-04-06 LAB — MRSA PCR SCREENING: MRSA by PCR: NEGATIVE

## 2017-04-06 MED ORDER — HYDRALAZINE HCL 20 MG/ML IJ SOLN: 10.0000 mg | Freq: Three times a day (TID) | INTRAMUSCULAR | Status: DC | PRN

## 2017-04-06 MED ORDER — VERAPAMIL HCL ER 180 MG PO TBCR
180.0000 mg | EXTENDED_RELEASE_TABLET | Freq: Every day | ORAL | Status: DC
  Filled 2017-04-06: qty 1

## 2017-04-06 MED ORDER — MONTELUKAST SODIUM 10 MG PO TABS: 10.0000 mg | ORAL_TABLET | Freq: Every day | ORAL | Status: DC | PRN

## 2017-04-06 MED ORDER — METOPROLOL SUCCINATE ER 25 MG PO TB24: 25.0000 mg | ORAL_TABLET | Freq: Every day | ORAL | 0 refills | Status: DC

## 2017-04-06 MED ORDER — VERAPAMIL HCL ER 180 MG PO TBCR: 180.0000 mg | EXTENDED_RELEASE_TABLET | Freq: Every day | ORAL | Status: DC

## 2017-04-06 MED ORDER — ONDANSETRON HCL 4 MG/2ML IJ SOLN: 4.0000 mg | Freq: Four times a day (QID) | INTRAMUSCULAR | Status: DC | PRN

## 2017-04-06 MED ORDER — ASPIRIN 325 MG PO TABS: 325.0000 mg | ORAL_TABLET | Freq: Every day | ORAL | Status: DC

## 2017-04-06 MED ORDER — ZOLPIDEM TARTRATE 5 MG PO TABS: 5.0000 mg | ORAL_TABLET | Freq: Every evening | ORAL | Status: DC | PRN

## 2017-04-06 MED ORDER — HYDROCHLOROTHIAZIDE 25 MG PO TABS: 25.0000 mg | ORAL_TABLET | Freq: Every day | ORAL | Status: DC

## 2017-04-06 MED ORDER — ACETAMINOPHEN 325 MG PO TABS: 650.0000 mg | ORAL_TABLET | ORAL | Status: DC | PRN

## 2017-04-06 MED ORDER — INFLUENZA VAC SPLIT QUAD 0.5 ML IM SUSY
0.5000 mL | PREFILLED_SYRINGE | INTRAMUSCULAR | Status: DC
  Filled 2017-04-06: qty 0.5

## 2017-04-06 MED ORDER — REGADENOSON 0.4 MG/5ML IV SOLN
INTRAVENOUS | Status: AC
  Filled 2017-04-06: qty 5

## 2017-04-06 MED ORDER — TECHNETIUM TC 99M TETROFOSMIN IV KIT
10.0000 | PACK | Freq: Once | INTRAVENOUS | Status: AC | PRN
  Administered 2017-04-06: 10 via INTRAVENOUS

## 2017-04-06 MED ORDER — HEPARIN SODIUM (PORCINE) 5000 UNIT/ML IJ SOLN
5000.0000 [IU] | Freq: Three times a day (TID) | INTRAMUSCULAR | Status: DC
  Administered 2017-04-06: 5000 [IU] via SUBCUTANEOUS
  Filled 2017-04-06 (×2): qty 1

## 2017-04-06 MED ORDER — METOPROLOL SUCCINATE ER 25 MG PO TB24
25.0000 mg | ORAL_TABLET | Freq: Every day | ORAL | Status: DC
  Administered 2017-04-06: 25 mg via ORAL
  Filled 2017-04-06 (×2): qty 1

## 2017-04-06 MED ORDER — INFLUENZA VAC SPLIT QUAD 0.5 ML IM SUSY
0.5000 mL | PREFILLED_SYRINGE | Freq: Once | INTRAMUSCULAR | Status: AC
  Administered 2017-04-06: 0.5 mL via INTRAMUSCULAR

## 2017-04-06 MED ORDER — TECHNETIUM TC 99M TETROFOSMIN IV KIT
30.0000 | PACK | Freq: Once | INTRAVENOUS | Status: AC | PRN
  Administered 2017-04-06: 30 via INTRAVENOUS

## 2017-04-06 MED ORDER — TRAMADOL HCL 50 MG PO TABS
50.0000 mg | ORAL_TABLET | Freq: Four times a day (QID) | ORAL | Status: DC | PRN
  Administered 2017-04-06: 50 mg via ORAL
  Filled 2017-04-06: qty 1

## 2017-04-06 MED ORDER — LORATADINE 10 MG PO TABS: 10.0000 mg | ORAL_TABLET | Freq: Every day | ORAL | Status: DC

## 2017-04-06 NOTE — Discharge Summary (Addendum)
Physician Discharge Summary  MEGIN CONSALVO JJK:[id number] DOB: April 08, 1961 DOA: 04/05/2017  PCP: Burnis Medin, MD  Admit date: 04/05/2017 Discharge date: 04/06/2017  Admitted From: Home Disposition:  Home  Recommendations for Outpatient Follow-up:  1. Follow up with PCP in 2-3 weeks  Discharge Condition:Stable CODE STATUS:Full Diet recommendation: Regular   Brief/Interim Summary: 56 y.o. female  Past medical history significant for HTN, fmhx of father with MI at 8; presents with angina at rest. Substernal CP. Pressure quality. Started with activity several days ago. Pt ignored. Now having it at rest. No trauma or cough. No hx of MI or VTE. Denies N/V & diaphoresis with CP  Discharge Diagnoses:  Principal Problem:   Angina at rest [facility]) Active Problems:   Hypertension   Migraine   Allergic rhinitis  CP -Heart score noted to be 5 on admission -Patient found to have serially neg troponin -Cardiology was consulted, recommendations for stress test -Patient underwent stress test which demonstrated no reversible ischemia, low risk stress test  Hypertension -Patient was continued on 10 mg IV as needed -continue on HCTZ, Calan  Allergies -Pt has been continued on singulair, zyrtec->claritin -Remained stable  Migraines Remained stable this admission  Discharge Instructions   Allergies as of 04/06/2017      Reactions   Etodolac Hives   Possible anaphylaxis  With vomiting and throat feeling tight  ED visit 8 14    Latex Hives, Rash, Other (See Comments)   Causes blisters   Other Hives, Rash, Other (See Comments)   Gel used for ultrasound-causes blisters also   Adhesive [tape] Itching, Rash      Medication List    TAKE these medications   b complex vitamins tablet Take 2 tablets by mouth daily.   CALCIUM-MAGNESIUM PO Take 3 tablets by mouth daily.   cetirizine 10 MG tablet Commonly known as:  ZYRTEC Take 10 mg by mouth daily.   cyclobenzaprine 10 MG  tablet Commonly known as:  FLEXERIL Take 10 mg by mouth as needed for muscle spasms.   hydrochlorothiazide 25 MG tablet Commonly known as:  HYDRODIURIL Take 1 tablet (25 mg total) by mouth daily.   HYDROcodone-acetaminophen 7.5-325 MG tablet Commonly known as:  NORCO Take 1-2 tablets by mouth every 4 (four) hours as needed for pain (as needed for migraine). Reported on 09/23/2015   HYDROmorphone 4 MG tablet Commonly known as:  DILAUDID Take 4-8 mg by mouth every 4 (four) hours as needed for pain (rescure for migraine). Reported on 09/23/2015   meclizine 25 MG tablet Commonly known as:  ANTIVERT TAKE 2 TABLET (50 MG TOTAL) BY MOUTH 2 (TWO) TIMES DAILY AS NEEDED (FOR TRAVEL).   metoprolol succinate 25 MG 24 hr tablet Commonly known as:  TOPROL-XL Take 1 tablet (25 mg total) by mouth daily.   montelukast 10 MG tablet Commonly known as:  SINGULAIR Take 1 tablet (10 mg total) by mouth daily as needed (for seasonal allergies, uses as needed).   Multivitamin/Iron Tabs Take 1 tablet by mouth daily.   sulindac 200 MG tablet Commonly known as:  CLINORIL Take 200 mg by mouth 2 (two) times daily as needed (headache). Reported on 09/23/2015   SUMAtriptan 6 MG/0.5ML Soln injection Commonly known as:  IMITREX Inject 6 mg into the skin every 2 (two) hours as needed for migraine. Reported on 09/23/2015   traMADol 50 MG tablet Commonly known as:  ULTRAM Take 50 mg by mouth as needed for severe pain.   verapamil 180 MG  CR tablet Commonly known as:  CALAN-SR Take 1 tablet (180 mg total) by mouth at bedtime.   Vitamin C 500 MG Caps Take 1,000 mg by mouth every morning.   ZOMIG NA Place 1 spray into the nose daily as needed (for migraines). Reported on 09/23/2015      Follow-up Information    Panosh, [name], MD. Schedule an appointment as soon as possible for a visit in 2 week(s).   Specialties:  Internal Medicine, Pediatrics Contact information: [place]  [address] [phone]        Dixie Dials, MD. Schedule an appointment as soon as possible for a visit in 1 week(s).   Specialty:  Cardiology Contact information: [place] [address] [phone]          Allergies  Allergen Reactions  . Etodolac Hives    Possible anaphylaxis  With vomiting and throat feeling tight  ED visit 8 14   . Latex Hives, Rash and Other (See Comments)    Causes blisters  . Other Hives, Rash and Other (See Comments)    Gel used for ultrasound-causes blisters also   . Adhesive [Tape] Itching and Rash    Consultations:  Cardiology  Procedures/Studies: Dg Chest 2 View  Result Date: 04/05/2017 CLINICAL DATA:  Chest pain EXAM: CHEST  2 VIEW COMPARISON:  11/15/2015 FINDINGS: The heart size and mediastinal contours are within normal limits. Both lungs are clear. The visualized skeletal structures are unremarkable. IMPRESSION: No active cardiopulmonary disease. Electronically Signed   By: Donavan Foil M.D.   On: 04/05/2017 20:32   Nm Myocar Multi W/spect W/wall Motion / Ef  Result Date: 04/06/2017 CLINICAL DATA:  Chest pain, shortness of breath and family history of coronary artery disease. EXAM: MYOCARDIAL IMAGING WITH SPECT (REST AND PHARMACOLOGIC-STRESS) GATED LEFT VENTRICULAR WALL MOTION STUDY LEFT VENTRICULAR EJECTION FRACTION TECHNIQUE: Standard myocardial SPECT imaging was performed after resting intravenous injection of 10 mCi Tc-9m tetrofosmin. Subsequently, intravenous infusion of Lexiscan was performed under the supervision of the Cardiology staff. At peak effect of the drug, 30 mCi Tc-71m tetrofosmin was injected intravenously and standard myocardial SPECT imaging was performed. Quantitative gated imaging was also performed to evaluate left ventricular wall motion, and estimate left ventricular ejection fraction. COMPARISON:  None. FINDINGS: Perfusion: No decreased activity in the left ventricle on stress imaging to suggest  reversible ischemia or infarction. Wall Motion: Normal left ventricular wall motion. No left ventricular dilation. Left Ventricular Ejection Fraction: 69 % End diastolic volume 79 ml End systolic volume 24 ml IMPRESSION: 1. No reversible ischemia or infarction. 2. Normal left ventricular wall motion. 3. Left ventricular ejection fraction 69% 4. Non invasive risk stratification*: Low *2012 Appropriate Use Criteria for Coronary Revascularization Focused Update: J Am Coll Cardiol. 4010;27(2):536-644. http://content.airportbarriers.com.aspx?articleid=[email or web address] Electronically Signed   By: Aletta Edouard M.D.   On: 04/06/2017 14:47    Subjective: Eager to go home  Discharge Exam: Vitals:   04/06/17 1042 04/06/17 1412  BP:  (!) 148/63  Pulse:  75  Resp:  (!) 25  Temp: 98.1 F (36.7 C)   SpO2:  97%   Vitals:   04/06/17 0800 04/06/17 1039 04/06/17 1042 04/06/17 1412  BP:  (!) 105/57  (!) 148/63  Pulse:  69  75  Resp:  14  (!) 25  Temp: 98.1 F (36.7 C)  98.1 F (36.7 C)   TempSrc: Oral     SpO2:  98%  97%  Weight:  Height:        General: Pt is alert, awake, not in acute distress Cardiovascular: RRR, S1/S2 +, no rubs, no gallops Respiratory: CTA bilaterally, no wheezing, no rhonchi Abdominal: Soft, NT, ND, bowel sounds + Extremities: no edema, no cyanosis   The results of significant diagnostics from this hospitalization (including imaging, microbiology, ancillary and laboratory) are listed below for reference.     Microbiology: Recent Results (from the past 240 hour(s))  MRSA PCR Screening     Status: None   Collection Time: 04/06/17  3:12 AM  Result Value Ref Range Status   MRSA by PCR NEGATIVE NEGATIVE Final    Comment:        The GeneXpert MRSA Assay (FDA approved for NASAL specimens only), is one component of a comprehensive MRSA colonization surveillance program. It is not intended to diagnose MRSA infection nor to guide or monitor treatment for MRSA  infections.      Labs: BNP (last 3 results) No results for input(s): BNP in the last 8760 hours. Basic Metabolic Panel:  Recent Labs Lab 04/05/17 2031 04/06/17 0109  NA 140  --   K 3.5  --   CL 102  --   CO2 28  --   GLUCOSE 103*  --   BUN 16  --   CREATININE 0.85 0.80  CALCIUM 9.4  --    Liver Function Tests: No results for input(s): AST, ALT, ALKPHOS, BILITOT, PROT, ALBUMIN in the last 168 hours. No results for input(s): LIPASE, AMYLASE in the last 168 hours. No results for input(s): AMMONIA in the last 168 hours. CBC:  Recent Labs Lab 04/05/17 2031 04/06/17 0644  WBC 9.1 8.6  HGB 14.5 12.6  HCT 43.6 37.8  MCV 88.1 87.1  PLT 340 282   Cardiac Enzymes:  Recent Labs Lab 04/06/17 0109 04/06/17 0644 04/06/17 1415  TROPONINI <0.03 <0.03 <0.03   BNP: Invalid input(s): POCBNP CBG: No results for input(s): GLUCAP in the last 168 hours. D-Dimer No results for input(s): DDIMER in the last 72 hours. Hgb A1c No results for input(s): HGBA1C in the last 72 hours. Lipid Profile No results for input(s): CHOL, HDL, LDLCALC, TRIG, CHOLHDL, LDLDIRECT in the last 72 hours. Thyroid function studies No results for input(s): TSH, T4TOTAL, T3FREE, THYROIDAB in the last 72 hours.  Invalid input(s): FREET3 Anemia work up No results for input(s): VITAMINB12, FOLATE, FERRITIN, TIBC, IRON, RETICCTPCT in the last 72 hours. Urinalysis    Component Value Date/Time   COLORURINE YELLOW 05/22/2016 [place] 05/22/2016 1539   LABSPEC 1.010 05/22/2016 1539   PHURINE 6.0 05/22/2016 1539   GLUCOSEU NEGATIVE 05/22/2016 1539   HGBUR NEGATIVE 05/22/2016 1539   BILIRUBINUR neg 02/12/2017 1352   KETONESUR NEGATIVE 05/22/2016 1539   PROTEINUR neg 02/12/2017 1352   PROTEINUR NEGATIVE 05/22/2016 1539   UROBILINOGEN 0.2 02/12/2017 1352   UROBILINOGEN 0.2 04/24/2015 0545   NITRITE neg 02/12/2017 1352   NITRITE NEGATIVE 05/22/2016 1539   LEUKOCYTESUR Negative  02/12/2017 1352   Sepsis Labs Invalid input(s): PROCALCITONIN,  WBC,  LACTICIDVEN Microbiology Recent Results (from the past 240 hour(s))  MRSA PCR Screening     Status: None   Collection Time: 04/06/17  3:12 AM  Result Value Ref Range Status   MRSA by PCR NEGATIVE NEGATIVE Final    Comment:        The GeneXpert MRSA Assay (FDA approved for NASAL specimens only), is one component of a comprehensive MRSA colonization surveillance program. It  is not intended to diagnose MRSA infection nor to guide or monitor treatment for MRSA infections.      SIGNED:   Donne Hazel, MD  [facility] 04/06/2017, 4:10 PM  If 7PM-7AM, please contact night-coverage [email or web address] Password [email or web address]

## 2017-04-06 NOTE — H&P (Signed)
[facility] History and Physical  Laura Smith VVO:[id number] DOB: 03/12/1961 DOA: 04/05/2017  Referring physician:  PCP: Burnis Medin, MD   Chief Complaint: CP  HPI: Laura Smith is a 56 y.o. female  Past medical history significant for HTN, fmhx of father with MI at 66; presents with angina at rest. Substernal CP. Pressure quality. Started with activity several days ago. Pt ignored. Now having it at rest. No trauma or cough. No hx of MI or VTE. Denies N/V & diaphoresis with CP.  ED Course: Checked EKG and CXR. Labs ordered. Hospitalist called.   Review of Systems:  As per HPI otherwise 10 point review of systems negative.    Past Medical History:  Diagnosis Date  . Allergy   . Endometriosis    surgery laser 2001  . Hemorrhoids   . Hx of basal cell carcinoma   . Hypertension   . Migraine   . Urinary tract infection    Past Surgical History:  Procedure Laterality Date  . [place]  . HYSTEROSCOPY  08/1999   AT TIME OF LAPAROSCOPY  . PELVIC LAPAROSCOPY  08/1999   AND HYSTEROSCOPY--ENDOMETRIOSIS   Social History:  reports that she has never smoked. She has never used smokeless tobacco. She reports that she does not drink alcohol or use drugs.  Allergies  Allergen Reactions  . Etodolac Hives    Possible anaphylaxis  With vomiting and throat feeling tight  ED visit 8 14   . Latex Hives, Rash and Other (See Comments)    Causes blisters  . Other Hives, Rash and Other (See Comments)    Gel used for ultrasound-causes blisters also   . Adhesive [Tape] Itching and Rash    Family History  Problem Relation Age of Onset  . Breast cancer Mother        Age 57  . Colon cancer Mother 63  . Breast cancer Cousin        Age 78's  . Heart disease Father   . Hypertension Paternal Grandmother   . Heart disease Paternal Grandmother   . Stroke Paternal Grandmother   . Cancer Paternal  Grandmother        Lung  . Cancer Paternal Grandfather        Prostate  . Stroke Maternal Grandfather      Prior to Admission medications   Medication Sig Start Date End Date Taking? Authorizing Provider  Ascorbic Acid (VITAMIN C) 500 MG CAPS Take 1,000 mg by mouth every morning.    Yes [provider]  b complex vitamins tablet Take 2 tablets by mouth daily.    Yes [provider]  CALCIUM-MAGNESIUM PO Take 3 tablets by mouth daily.    Yes [provider]  cetirizine (ZYRTEC) 10 MG tablet Take 10 mg by mouth daily.   Yes [provider]  cyclobenzaprine (FLEXERIL) 10 MG tablet Take 10 mg by mouth as needed for muscle spasms.  01/25/17  Yes [provider]  hydrochlorothiazide (HYDRODIURIL) 25 MG tablet Take 1 tablet (25 mg total) by mouth daily. 07/14/16  Yes Panosh, [name], MD  HYDROcodone-acetaminophen (NORCO) 7.5-325 MG per tablet Take 1-2 tablets by mouth every 4 (four) hours as needed for pain (as needed for migraine). Reported on 09/23/2015   Yes Lewit, Tedra Coupe, MD  HYDROmorphone (DILAUDID) 4 MG tablet Take 4-8 mg by mouth every 4 (four) hours as  needed for pain (rescure for migraine). Reported on 09/23/2015   Yes Lewit, Tedra Coupe, MD  meclizine (ANTIVERT) 25 MG tablet TAKE 2 TABLET (50 MG TOTAL) BY MOUTH 2 (TWO) TIMES DAILY AS NEEDED (FOR TRAVEL). 01/13/17  Yes Panosh, [name], MD  montelukast (SINGULAIR) 10 MG tablet Take 1 tablet (10 mg total) by mouth daily as needed (for seasonal allergies, uses as needed). 07/14/16  Yes Panosh, [name], MD  Multiple Vitamins-Iron (MULTIVITAMIN/IRON) TABS Take 1 tablet by mouth daily.    Yes [provider]  sulindac (CLINORIL) 200 MG tablet Take 200 mg by mouth 2 (two) times daily as needed (headache). Reported on 09/23/2015   Yes [provider]  SUMAtriptan (IMITREX) 6 MG/0.5ML SOLN injection Inject 6 mg into the skin every 2 (two) hours as needed for migraine. Reported on 09/23/2015   Yes [provider]  traMADol (ULTRAM) 50 MG tablet Take 50 mg by mouth as needed for severe pain.  01/19/17  Yes [provider]  verapamil (CALAN-SR) 180 MG CR tablet Take 1 tablet (180 mg total) by mouth at bedtime. 07/14/16  Yes Panosh, [name], MD  ZOLMitriptan (ZOMIG NA) Place 1 spray into the nose daily as needed (for migraines). Reported on 09/23/2015   Yes [provider]   Physical Exam: Vitals:   04/05/17 1947 04/05/17 2318  BP: 135/71 (!) 145/76  Pulse: 79 75  Resp: 15 17  Temp: 97.7 F (36.5 C)   TempSrc: Oral   SpO2: 99% 98%  Weight: 98.4 kg (217 lb)   Height: 5' 7.5" (1.715 m)     Wt Readings from Last 3 Encounters:  04/05/17 98.4 kg (217 lb)  02/12/17 101.2 kg (223 lb)  11/04/16 103.1 kg (227 lb 6.4 oz)    General:  Appears calm and comfortable; A&Ox3 Eyes:  PERRL, EOMI, normal lids, iris ENT:  grossly normal hearing, lips & tongue Neck:  no LAD, masses or thyromegaly Cardiovascular:  RRR, no m/r/g. No LE edema.  Respiratory:  CTA bilaterally, no w/r/r. Normal respiratory effort. Abdomen:  soft, ntnd Skin:  no rash or induration seen on limited exam Musculoskeletal:  grossly normal tone BUE/BLE Psychiatric:  grossly normal mood and affect, speech fluent and appropriate Neurologic:  CN 2-12 grossly intact, moves all extremities in coordinated fashion.          Labs on Admission:  Basic Metabolic Panel:  Recent Labs Lab 04/05/17 2031  NA 140  K 3.5  CL 102  CO2 28  GLUCOSE 103*  BUN 16  CREATININE 0.85  CALCIUM 9.4   Liver Function Tests: No results for input(s): AST, ALT, ALKPHOS, BILITOT, PROT, ALBUMIN in the last 168 hours. No results for input(s): LIPASE, AMYLASE in the last 168 hours. No results for input(s): AMMONIA in the last 168 hours. CBC:  Recent Labs Lab 04/05/17 2031  WBC 9.1  HGB 14.5  HCT 43.6  MCV 88.1  PLT 340   Cardiac Enzymes: No results for input(s): CKTOTAL, CKMB, CKMBINDEX, TROPONINI in the last 168  hours.  BNP (last 3 results) No results for input(s): BNP in the last 8760 hours.  ProBNP (last 3 results) No results for input(s): PROBNP in the last 8760 hours.   Serum creatinine: 0.85 mg/dL 04/05/17 2031 Estimated creatinine clearance: 88.8 mL/min  CBG: No results for input(s): GLUCAP in the last 168 hours.  Radiological Exams on Admission: Dg Chest 2 View  Result Date: 04/05/2017 CLINICAL DATA:  Chest pain EXAM: CHEST  2  VIEW COMPARISON:  11/15/2015 FINDINGS: The heart size and mediastinal contours are within normal limits. Both lungs are clear. The visualized skeletal structures are unremarkable. IMPRESSION: No active cardiopulmonary disease. Electronically Signed   By: Donavan Foil M.D.   On: 04/05/2017 20:32    EKG: Independently reviewed. NSR. No STEMI.  Assessment/Plan Active Problems:   Angina at rest [facility])  1) CP Given hear score of 5 in ED. - serial trop ordered, initial neg - prn EKG CP - prn moprhine CP - nitro paste - asa in ED and QD - ECHO ordered for AM - tele bed, cardiac monitoring - ambien for sleep prn - zofran prn for nausea  Hypertension When necessary hydralazine 10 mg IV as needed for severe blood pressure Cont HCTZ, Calan  Allergies Cont singulair, zyrtec->claritin  Migraines May need triptan or opioid for relief base don hx  Code Status: FC DVT Prophylaxis: heparin Family Communication: husband at bedside Disposition Plan: Pending Improvement  Status: obs tele  Elwin Mocha, MD Family Medicine [facility] [email or web address] Password [email or web address]

## 2017-04-06 NOTE — Progress Notes (Signed)
  Echocardiogram 2D Echocardiogram has been performed.  Laura Smith 04/06/2017, 9:29 AM

## 2017-04-06 NOTE — Care Management Note (Signed)
Case Management Note  Patient Details  Name: NIKOLE SWARTZENTRUBER MRN: [id number] Date of Birth: [birth year]  Subjective/Objective:                  Chest pain at rest  Action/Plan: Date:  October 16,2 018 Chart reviewed for concurrent status and case management needs.  Will continue to follow patient progress.  Discharge Planning: following for needs  Expected discharge date: [id number]  Velva Harman, BSN, [place], [place]  Expected Discharge Date:                  Expected Discharge Plan:  Home/Self Care  In-House Referral:     Discharge planning Services  CM Consult  Post Acute Care Choice:    Choice offered to:     DME Arranged:    DME Agency:     HH Arranged:    HH Agency:     Status of Service:  In process, will continue to follow  If discussed at Long Length of Stay Meetings, dates discussed:    Additional Comments:  Leeroy Cha, RN 04/06/2017, 8:54 AM

## 2017-04-06 NOTE — Consult Note (Signed)
Referring Physician:  ZAYANNA PUNDT is an 56 y.o. female.                       Chief Complaint: Chest pain  HPI: 56 year old female with past medical history of HTN and family history of father having MI at age 37 has substernal chest pain, sharp and heavy and associated with shortness of breath and left arm numbness. She denies fever, cough, nausea, vomiting or diaphoresis. EKG shows non-specific T wave changes and echocardiogram shows normal LV systolic function. Her troponin-Is are normal.  Past Medical History:  Diagnosis Date  . Allergy   . Endometriosis    surgery laser 2001  . Hemorrhoids   . Hx of basal cell carcinoma   . Hypertension   . Migraine   . Urinary tract infection       Past Surgical History:  Procedure Laterality Date  . [place]  . HYSTEROSCOPY  08/1999   AT TIME OF LAPAROSCOPY  . PELVIC LAPAROSCOPY  08/1999   AND HYSTEROSCOPY--ENDOMETRIOSIS    Family History  Problem Relation Age of Onset  . Breast cancer Mother        Age 97  . Colon cancer Mother 42  . Breast cancer Cousin        Age 41's  . Heart disease Father   . Hypertension Paternal Grandmother   . Heart disease Paternal Grandmother   . Stroke Paternal Grandmother   . Cancer Paternal Grandmother        Lung  . Cancer Paternal Grandfather        Prostate  . Stroke Maternal Grandfather    Social History:  reports that she has never smoked. She has never used smokeless tobacco. She reports that she does not drink alcohol or use drugs.  Allergies:  Allergies  Allergen Reactions  . Etodolac Hives    Possible anaphylaxis  With vomiting and throat feeling tight  ED visit 8 14   . Latex Hives, Rash and Other (See Comments)    Causes blisters  . Other Hives, Rash and Other (See Comments)    Gel used for ultrasound-causes blisters also   . Adhesive [Tape] Itching and Rash    Medications Prior to Admission   Medication Sig Dispense Refill  . Ascorbic Acid (VITAMIN C) 500 MG CAPS Take 1,000 mg by mouth every morning.     Marland Kitchen b complex vitamins tablet Take 2 tablets by mouth daily.     Marland Kitchen CALCIUM-MAGNESIUM PO Take 3 tablets by mouth daily.     . cetirizine (ZYRTEC) 10 MG tablet Take 10 mg by mouth daily.    . cyclobenzaprine (FLEXERIL) 10 MG tablet Take 10 mg by mouth as needed for muscle spasms.     . hydrochlorothiazide (HYDRODIURIL) 25 MG tablet Take 1 tablet (25 mg total) by mouth daily. 90 tablet 2  . HYDROcodone-acetaminophen (NORCO) 7.5-325 MG per tablet Take 1-2 tablets by mouth every 4 (four) hours as needed for pain (as needed for migraine). Reported on 09/23/2015    . HYDROmorphone (DILAUDID) 4 MG tablet Take 4-8 mg by mouth every 4 (four) hours as needed for pain (rescure for migraine). Reported on 09/23/2015    . meclizine (ANTIVERT) 25 MG tablet TAKE 2 TABLET (50 MG TOTAL) BY MOUTH 2 (TWO) TIMES DAILY AS NEEDED (FOR TRAVEL). 30 tablet 0  . montelukast (  SINGULAIR) 10 MG tablet Take 1 tablet (10 mg total) by mouth daily as needed (for seasonal allergies, uses as needed). 90 tablet 2  . Multiple Vitamins-Iron (MULTIVITAMIN/IRON) TABS Take 1 tablet by mouth daily.     . sulindac (CLINORIL) 200 MG tablet Take 200 mg by mouth 2 (two) times daily as needed (headache). Reported on 09/23/2015    . SUMAtriptan (IMITREX) 6 MG/0.5ML SOLN injection Inject 6 mg into the skin every 2 (two) hours as needed for migraine. Reported on 09/23/2015    . traMADol (ULTRAM) 50 MG tablet Take 50 mg by mouth as needed for severe pain.     . verapamil (CALAN-SR) 180 MG CR tablet Take 1 tablet (180 mg total) by mouth at bedtime. 90 tablet 2  . ZOLMitriptan (ZOMIG NA) Place 1 spray into the nose daily as needed (for migraines). Reported on 09/23/2015      Results for orders placed or performed during the hospital encounter of 04/05/17 (from the past 48 hour(s))  Basic metabolic panel     Status: Abnormal   Collection Time:  04/05/17  8:31 PM  Result Value Ref Range   Sodium 140 135 - 145 mmol/L   Potassium 3.5 3.5 - 5.1 mmol/L   Chloride 102 101 - 111 mmol/L   CO2 28 22 - 32 mmol/L   Glucose, Bld 103 (H) 65 - 99 mg/dL   BUN 16 6 - 20 mg/dL   Creatinine, Ser 0.85 0.44 - 1.00 mg/dL   Calcium 9.4 8.9 - 10.3 mg/dL   GFR calc non Af Amer >60 >60 mL/min   GFR calc Af Amer >60 >60 mL/min    Comment: (NOTE) The eGFR has been calculated using the CKD EPI equation. This calculation has not been validated in all clinical situations. eGFR's persistently <60 mL/min signify possible Chronic Kidney Disease.    Anion gap 10 5 - 15  CBC     Status: None   Collection Time: 04/05/17  8:31 PM  Result Value Ref Range   WBC 9.1 4.0 - 10.5 K/uL   RBC 4.95 3.87 - 5.11 MIL/uL   Hemoglobin 14.5 12.0 - 15.0 g/dL   HCT 43.6 36.0 - 46.0 %   MCV 88.1 78.0 - 100.0 fL   MCH 29.3 26.0 - 34.0 pg   MCHC 33.3 30.0 - 36.0 g/dL   RDW 14.0 11.5 - 15.5 %   Platelets 340 150 - 400 K/uL  POCT i-Stat troponin I     Status: None   Collection Time: 04/05/17  8:58 PM  Result Value Ref Range   Troponin i, poc 0.00 0.00 - 0.08 ng/mL   Comment 3            Comment: Due to the release kinetics of cTnI, a negative result within the first hours of the onset of symptoms does not rule out myocardial infarction with certainty. If myocardial infarction is still suspected, repeat the test at appropriate intervals.   Troponin I (q 6hr x 3)     Status: None   Collection Time: 04/06/17  1:09 AM  Result Value Ref Range   Troponin I <0.03 <0.03 ng/mL  Creatinine, serum     Status: None   Collection Time: 04/06/17  1:09 AM  Result Value Ref Range   Creatinine, Ser 0.80 0.44 - 1.00 mg/dL   GFR calc non Af Amer >60 >60 mL/min   GFR calc Af Amer >60 >60 mL/min    Comment: (NOTE) The eGFR has been  calculated using the CKD EPI equation. This calculation has not been validated in all clinical situations. eGFR's persistently <60 mL/min signify  possible Chronic Kidney Disease.   MRSA PCR Screening     Status: None   Collection Time: 04/06/17  3:12 AM  Result Value Ref Range   MRSA by PCR NEGATIVE NEGATIVE    Comment:        The GeneXpert MRSA Assay (FDA approved for NASAL specimens only), is one component of a comprehensive MRSA colonization surveillance program. It is not intended to diagnose MRSA infection nor to guide or monitor treatment for MRSA infections.   Troponin I (q 6hr x 3)     Status: None   Collection Time: 04/06/17  6:44 AM  Result Value Ref Range   Troponin I <0.03 <0.03 ng/mL  CBC     Status: None   Collection Time: 04/06/17  6:44 AM  Result Value Ref Range   WBC 8.6 4.0 - 10.5 K/uL   RBC 4.34 3.87 - 5.11 MIL/uL   Hemoglobin 12.6 12.0 - 15.0 g/dL   HCT 37.8 36.0 - 46.0 %   MCV 87.1 78.0 - 100.0 fL   MCH 29.0 26.0 - 34.0 pg   MCHC 33.3 30.0 - 36.0 g/dL   RDW 14.1 11.5 - 15.5 %   Platelets 282 150 - 400 K/uL   Dg Chest 2 View  Result Date: 04/05/2017 CLINICAL DATA:  Chest pain EXAM: CHEST  2 VIEW COMPARISON:  11/15/2015 FINDINGS: The heart size and mediastinal contours are within normal limits. Both lungs are clear. The visualized skeletal structures are unremarkable. IMPRESSION: No active cardiopulmonary disease. Electronically Signed   By: Donavan Foil M.D.   On: 04/05/2017 20:32    Review Of Systems Constitutional: No fever, chills, weight loss or gain. Eyes: No vision change, wears glasses. No discharge or pain. Ears: No hearing loss, No tinnitus. Respiratory: No asthma, COPD, pneumonias. No shortness of breath. No hemoptysis. Cardiovascular: Positive chest pain, no palpitation, leg edema. Gastrointestinal: No nausea, vomiting, diarrhea, constipation. No GI bleed. No hepatitis. Genitourinary: No dysuria, hematuria, kidney stone. No incontinance. Neurological: No headache, stroke, seizures.  Psychiatry: No psych facility admission for anxiety, depression, suicide. No detox. Skin: No  rash. Musculoskeletal: Positive joint pain, no fibromyalgia. No neck pain, back pain. Lymphadenopathy: No lymphadenopathy. Hematology: No anemia or easy bruising.   Blood pressure (!) 120/55, pulse 67, temperature 98.1 F (36.7 C), temperature source Oral, resp. rate 12, height 5' 8"  (1.727 m), weight 101.3 kg (223 lb 5.2 oz), SpO2 91 %. Body mass index is 33.96 kg/m. General appearance: alert, cooperative, appears stated age and no distress Head: Normocephalic, atraumatic. Eyes: Blue eyes, pink conjunctiva, corneas clear. PERRL, EOM's intact. Neck: No adenopathy, no carotid bruit, no JVD, supple, symmetrical, trachea midline and thyroid not enlarged. Resp: Clear to auscultation bilaterally. Cardio: Regular rate and rhythm, S1, S2 normal, II/VI systolic murmur, no click, rub or gallop GI: Soft, non-tender; bowel sounds normal; no organomegaly. Extremities: Trace edema, cyanosis or clubbing. Skin: Warm and dry.  Neurologic: Alert and oriented X 3, normal strength. Normal coordination and gait.  Assessment/Plan Chest pain MI ruled out Hypertension Obesity FH of premature CAD  Nuclear stress test today.  Birdie Riddle, MD  04/06/2017, 9:48 AM

## 2017-04-07 ENCOUNTER — Telehealth: Payer: Self-pay | Admitting: *Deleted

## 2017-04-07 NOTE — Telephone Encounter (Signed)
Unable to reach patient at time of TCM Call. Left message for patient to return call when available.  

## 2017-04-07 NOTE — Telephone Encounter (Signed)
Laura Smith pt returned your call.

## 2017-04-08 NOTE — Telephone Encounter (Signed)
Transition Care Management Follow-up Telephone Call  Per Discharge Summary: Admit date: 04/05/2017 Discharge date: 04/06/2017  Admitted From: Home Disposition:  Home  Recommendations for Outpatient Follow-up:  1. Follow up with PCP in 2-3 weeks  Discharge Condition:Stable CODE STATUS:Full Diet recommendation: Regular   Brief/Interim Summary: 56 y.o.femalePast medical history significant for HTN, fmhx of father with MI at 49; presents with angina at rest. Substernal CP. Pressure quality. Started with activity several days ago. Pt ignored. Now having it at rest. No trauma or cough. No hx of MI or VTE. Denies N/V & diaphoresis with CP   How have you been since you were released from the hospital? "Other than my migraine, I was really weak yesterday and couldn't go to work. I can't shake this headache."   Do you understand why you were in the hospital? yes   Do you understand the discharge instructions? yes   Where were you discharged to? Home  --  Items Reviewed:  Medications reviewed: yes  Allergies reviewed: yes  Dietary changes reviewed: yes  Referrals reviewed: yes, cardiology   Functional Questionnaire:   Activities of Daily Living (ADLs):   She states they are independent in the following: ambulation, bathing and hygiene, feeding, continence, grooming, toileting and dressing States they require assistance with the following: none   Any transportation issues/concerns?: no   Any patient concerns? no   Confirmed importance and date/time of follow-up visits scheduled no, unable to find appt time that worked for patient.  Confirmed with patient if condition begins to worsen call PCP or go to the ER.  Patient was given the office number and encouraged to call back with question or concerns.  : yes

## 2017-04-08 NOTE — Telephone Encounter (Signed)
She should be seeing ardiology in a week .   Discharge says follow-up PCP in 2-3 weeks Do not see any medical urgency to do sooner than that unless patient requests earlier time  Okay to schedule her for the week of November 5.which is 3 weeks

## 2017-04-08 NOTE — Telephone Encounter (Signed)
Constance Holster, please call patient to schedule 30 min hospital follow-up appointment on or before 04/20/17. Patient requesting Friday PM appointment. I was unable to find anything with PCP.

## 2017-04-08 NOTE — Telephone Encounter (Signed)
Can we create 30 min slot?

## 2017-04-08 NOTE — Telephone Encounter (Signed)
Appt scheduled 04/30/17 @ 2:30pm.

## 2017-04-16 ENCOUNTER — Ambulatory Visit (INDEPENDENT_AMBULATORY_CARE_PROVIDER_SITE_OTHER): Payer: 59 | Admitting: Internal Medicine

## 2017-04-16 ENCOUNTER — Encounter: Payer: Self-pay | Admitting: Internal Medicine

## 2017-04-16 VITALS — BP 148/82 | HR 86 | Temp 98.8°F | Wt 224.0 lb

## 2017-04-16 DIAGNOSIS — R079 Chest pain, unspecified: Secondary | ICD-10-CM

## 2017-04-16 DIAGNOSIS — Z636 Dependent relative needing care at home: Secondary | ICD-10-CM | POA: Diagnosis not present

## 2017-04-16 DIAGNOSIS — Z79899 Other long term (current) drug therapy: Secondary | ICD-10-CM

## 2017-04-16 DIAGNOSIS — I10 Essential (primary) hypertension: Secondary | ICD-10-CM | POA: Diagnosis not present

## 2017-04-16 DIAGNOSIS — Z8669 Personal history of other diseases of the nervous system and sense organs: Secondary | ICD-10-CM

## 2017-04-16 DIAGNOSIS — Z6379 Other stressful life events affecting family and household: Secondary | ICD-10-CM | POA: Diagnosis not present

## 2017-04-16 DIAGNOSIS — R142 Eructation: Secondary | ICD-10-CM

## 2017-04-16 MED ORDER — ZOLMITRIPTAN 5 MG NA SOLN: 1.0000 | Freq: Every day | NASAL | 0 refills | Status: DC | PRN

## 2017-04-16 MED ORDER — PANTOPRAZOLE SODIUM 40 MG PO TBEC: 40.0000 mg | DELAYED_RELEASE_TABLET | Freq: Every day | ORAL | 1 refills | Status: DC

## 2017-04-16 NOTE — Progress Notes (Signed)
Chief Complaint  Patient presents with  . Hospitalization Follow-up    Seen in [facility] for heat attack symptoms. No back to baseline - still having symptoms of chest pressure, a lot of belching and headaches.     HPI: Laura Smith 56 y.o. come in for same-day appointment follow-up hospital.  Was seen about October 15 for chest pain sharp shortness of breath left arm that lasted a few days.  She has been having increasing symptoms    2-3 days  Before   Went to ed.   was concerned about cardiovascular cause.  Had neg cv eval .  States that not many people talk to her and explained things.  The nitroglycerin did trigger a migraine. stabbing chest pain    The day went in . Now belching  And water  No  dyphagia   And taking supp for     Calcium mag  Nitrates .  Can taste the vitamins after she takes them in the acid in her chest.  She takes calcium magnesium for headache suppression.   dr Catalina Gravel.  Is been treating her migraines but she asked for [place] to take over possibly in the future she has not been seen in about a year as for refills of the Zomig nasal spray as this works quite well. She just feels bad is very stressed hard to catch up at work at [employer] and is caretaking for her mom. Feels overwhelmed at times and mixed emotions.  Feels that her memory is bad and sometimes cannot remember things.  Post prandial  Stools.     "Not feeling like me.  " No energy.  Snoring more.  No osa  She was given metoprolol to take when she went out of the emergency room but has not taken it yet wonders if she should.  Has not taken her blood pressure readings at home yet. Has a follow-up appointment cardiology with Dr. Einar Gip    ROS: See pertinent positives and negatives per HPI.  Past Medical History:  Diagnosis Date  . Allergy   . Endometriosis    surgery laser 2001  . Hemorrhoids   . Hx of basal cell carcinoma   . Hypertension   . Migraine   . Urinary tract infection     Family History  Problem  Relation Age of Onset  . Breast cancer Mother        Age 1  . Colon cancer Mother 34  . Breast cancer Cousin        Age 33's  . Heart disease Father   . Hypertension Paternal Grandmother   . Heart disease Paternal Grandmother   . Stroke Paternal Grandmother   . Cancer Paternal Grandmother        Lung  . Cancer Paternal Grandfather        Prostate  . Stroke Maternal Grandfather     Social History   Social History  . Marital status: Married    Spouse name: N/A  . Number of children: N/A  . Years of education: N/A   Social History Main Topics  . Smoking status: Never Smoker  . Smokeless tobacco: Never Used  . Alcohol use No  . Drug use: No  . Sexual activity: Not Currently    Birth control/ protection: Post-menopausal     Comment: Pt declined sexual hx questions   Other Topics Concern  . None   Social History Narrative   h h of 2 Pet cat  Daughter /  Married    g1 p1   Works [employer] engineer [employer]     Outpatient Medications Prior to Visit  Medication Sig Dispense Refill  . Ascorbic Acid (VITAMIN C) 500 MG CAPS Take 1,000 mg by mouth every morning.     Marland Kitchen b complex vitamins tablet Take 2 tablets by mouth daily.     Marland Kitchen CALCIUM-MAGNESIUM PO Take 3 tablets by mouth daily.     . cetirizine (ZYRTEC) 10 MG tablet Take 10 mg by mouth daily.    . cyclobenzaprine (FLEXERIL) 10 MG tablet Take 10 mg by mouth as needed for muscle spasms.     . hydrochlorothiazide (HYDRODIURIL) 25 MG tablet Take 1 tablet (25 mg total) by mouth daily. 90 tablet 2  . HYDROcodone-acetaminophen (NORCO) 7.5-325 MG per tablet Take 1-2 tablets by mouth every 4 (four) hours as needed for pain (as needed for migraine). Reported on 09/23/2015    . HYDROmorphone (DILAUDID) 4 MG tablet Take 4-8 mg by mouth every 4 (four) hours as needed for pain (rescure for migraine). Reported on 09/23/2015    . meclizine (ANTIVERT) 25 MG tablet TAKE 2 TABLET (50 MG TOTAL) BY MOUTH 2 (TWO) TIMES DAILY AS  NEEDED (FOR TRAVEL). 30 tablet 0  . metoprolol succinate (TOPROL-XL) 25 MG 24 hr tablet Take 1 tablet (25 mg total) by mouth daily. 30 tablet 0  . montelukast (SINGULAIR) 10 MG tablet Take 1 tablet (10 mg total) by mouth daily as needed (for seasonal allergies, uses as needed). 90 tablet 2  . Multiple Vitamins-Iron (MULTIVITAMIN/IRON) TABS Take 1 tablet by mouth daily.     . sulindac (CLINORIL) 200 MG tablet Take 200 mg by mouth 2 (two) times daily as needed (headache). Reported on 09/23/2015    . SUMAtriptan (IMITREX) 6 MG/0.5ML SOLN injection Inject 6 mg into the skin every 2 (two) hours as needed for migraine. Reported on 09/23/2015    . traMADol (ULTRAM) 50 MG tablet Take 50 mg by mouth as needed for severe pain.     . verapamil (CALAN-SR) 180 MG CR tablet Take 1 tablet (180 mg total) by mouth at bedtime. 90 tablet 2  . ZOLMitriptan (ZOMIG NA) Place 1 spray into the nose daily as needed (for migraines). Reported on 09/23/2015     No facility-administered medications prior to visit.      EXAM:  BP (!) 148/82 (BP Location: Right Arm, Patient Position: Sitting, Cuff Size: Normal)   Pulse 86   Temp 98.8 F (37.1 C) (Oral)   Wt 224 lb (101.6 kg)   BMI 34.06 kg/m   Body mass index is 34.06 kg/m.  GENERAL: vitals reviewed and listed above, alert, oriented, appears well hydrated and in no acute distress however has intermittent crying emotional tearfulness.  Intermittent severe burping and air swallowing.  However this does calm down when we are talking about certain things not related to stress. Nontoxic quiet respirations. HEENT: atraumatic, conjunctiva  clear, no obvious abnormalities on inspection of external nose and ears OP : no lesion edema or exudate  NECK: no obvious masses on inspection palpation  LUNGS: clear to auscultation bilaterally, no wheezes, rales or rhonchi, good air movement CV: HRRR, no clubbing cyanosis or  peripheral edema nl cap refill  Abdomen soft without  organomegaly guarding or rebound.  Points to mid sternum as area of discomfort when she gets this. MS: moves all extremities without noticeable focal  abnormality PSYCH: pleasant and cooperative,  looks stressed and a bit overwhelmed at times.  Normal speech Lab Results  Component Value Date   WBC 8.6 04/06/2017   HGB 12.6 04/06/2017   HCT 37.8 04/06/2017   PLT 282 04/06/2017   GLUCOSE 103 (H) 04/05/2017   CHOL 183 04/17/2016   TRIG 193.0 (H) 04/17/2016   HDL 53.30 04/17/2016   LDLDIRECT 133.8 04/07/2013   LDLCALC 91 04/17/2016   ALT 20 04/17/2016   AST 21 04/17/2016   NA 140 04/05/2017   K 3.5 04/05/2017   CL 102 04/05/2017   CREATININE 0.80 04/06/2017   BUN 16 04/05/2017   CO2 28 04/05/2017   TSH 3.76 04/17/2016   BP Readings from Last 3 Encounters:  04/16/17 (!) 148/82  04/06/17 132/74  04/06/17 (!) 148/63   Wt Readings from Last 3 Encounters:  04/16/17 224 lb (101.6 kg)  04/06/17 223 lb 5.2 oz (101.3 kg)  02/12/17 223 lb (101.2 kg)    ASSESSMENT AND PLAN:  Discussed the following assessment and plan:  Essential hypertension  History of migraine headaches  Belching symptom - Plan: US Abdomen Complete  Chest pain, unspecified type - Plan: US Abdomen Complete  Stress due to illness of family member  Caregiver stress - adding to  all of above problems   consdier counseling and  poss medication such as ssri   Medication management Total visit 40 mins > 50% spent counseling and coordinating care as indicated in above note and in instructions to patient .    At this time we will will feel her Zomig for headaches but would not prescribe controlled substances for her headaches at this time.  She may need to follow-up with her neurologist if he needs refills there. Pamphlet handout given about counseling between her full-time job and her family obligations and then having this illness stress it is been difficult for her to catch up at her work. -Patient advised to  return or notify health care team  if  new concerns arise.  Patient Instructions  Keep cardiology appt .   Will get  [place] of abd to check Gall bladder area .Marland Kitchen    Add acid blocker .   In case this is related to acid reflux and esophagus problem.  Advise  consider counseling..  About the stress and anxiety   Sometimes medication and  Counseling can be helpful .    If memory is an issue then we can gett   More evaluation .   But  Stress can cause poor memory also   Will refill   zomig nasal spray  For now  But   Usually I dont prescribe  Controlled substances for  Headaches    ( unless specialty care)    If bp 150 and over you can start metoprolol but  If not can wait and decide  Per Dr Einar Gip whether to start.   Plan FU visit in  3-4 weeks  Or as needed    Depending on results of all of above    Food Choices for Gastroesophageal Reflux Disease, Adult When you have gastroesophageal reflux disease (GERD), the foods you eat and your eating habits are very important. Choosing the right foods can help ease the discomfort of GERD. Consider working with a diet and nutrition specialist (dietitian) to help you make healthy food choices. What general guidelines should I follow? Eating plan  Choose healthy foods low in fat, such as fruits, vegetables, whole grains, low-fat dairy products, and lean meat, fish,  and poultry.  Eat frequent, small meals instead of three large meals each day. Eat your meals slowly, in a relaxed setting. Avoid bending over or lying down until 2-3 hours after eating.  Limit high-fat foods such as fatty meats or fried foods.  Limit your intake of oils, butter, and shortening to less than 8 teaspoons each day.  Avoid the following: ? Foods that cause symptoms. These may be different for different people. Keep a food diary to keep track of foods that cause symptoms. ? Alcohol. ? Drinking large amounts of liquid with meals. ? Eating meals during the 2-3 hours before  bed.  Cook foods using methods other than frying. This may include baking, grilling, or broiling. Lifestyle   Maintain a healthy weight. Ask your health care provider what weight is healthy for you. If you need to lose weight, work with your health care provider to do so safely.  Exercise for at least 30 minutes on 5 or more days each week, or as told by your health care provider.  Avoid wearing clothes that fit tightly around your waist and chest.  Do not use any products that contain nicotine or tobacco, such as cigarettes and e-cigarettes. If you need help quitting, ask your health care provider.  Sleep with the head of your bed raised. Use a wedge under the mattress or blocks under the bed frame to raise the head of the bed. What foods are not recommended? The items listed may not be a complete list. Talk with your dietitian about what dietary choices are best for you. Grains Pastries or quick breads with added fat. [place] toast. Vegetables Deep fried vegetables. [place] fries. Any vegetables prepared with added fat. Any vegetables that cause symptoms. For some people this may include tomatoes and tomato products, chili peppers, onions and garlic, and horseradish. Fruits Any fruits prepared with added fat. Any fruits that cause symptoms. For some people this may include citrus fruits, such as oranges, grapefruit, pineapple, and lemons. Meats and other protein foods High-fat meats, such as fatty beef or pork, hot dogs, ribs, ham, sausage, salami and bacon. Fried meat or protein, including fried fish and fried chicken. Nuts and nut butters. Dairy Whole milk and chocolate milk. Sour cream. Cream. Ice cream. Cream cheese. Milk shakes. Beverages Coffee and tea, with or without caffeine. Carbonated beverages. Sodas. Energy drinks. Fruit juice made with acidic fruits (such as orange or grapefruit). Tomato juice. Alcoholic drinks. Fats and oils Butter. Margarine. Shortening. Ghee. Sweets  and desserts Chocolate and cocoa. Donuts. Seasoning and other foods Pepper. Peppermint and spearmint. Any condiments, herbs, or seasonings that cause symptoms. For some people, this may include curry, hot sauce, or vinegar-based salad dressings. Summary  When you have gastroesophageal reflux disease (GERD), food and lifestyle choices are very important to help ease the discomfort of GERD.  Eat frequent, small meals instead of three large meals each day. Eat your meals slowly, in a relaxed setting. Avoid bending over or lying down until 2-3 hours after eating.  Limit high-fat foods such as fatty meat or fried foods. This information is not intended to replace advice given to you by your health care provider. Make sure you discuss any questions you have with your health care provider. Document Released: 06/08/2005 Document Revised: 06/09/2016 Document Reviewed: 06/09/2016 Elsevier Interactive Patient Education  2017 [place] K. Marques Ericson M.D.

## 2017-04-16 NOTE — Patient Instructions (Addendum)
Keep cardiology appt .   Will get  [place] of abd to check Gall bladder area .Laura Smith    Add acid blocker .   In case this is related to acid reflux and esophagus problem.  Advise  consider counseling..  About the stress and anxiety   Sometimes medication and  Counseling can be helpful .    If memory is an issue then we can gett   More evaluation .   But  Stress can cause poor memory also   Will refill   zomig nasal spray  For now  But   Usually I dont prescribe  Controlled substances for  Headaches    ( unless specialty care)    If bp 150 and over you can start metoprolol but  If not can wait and decide  Per Dr Einar Gip whether to start.   Plan FU visit in  3-4 weeks  Or as needed    Depending on results of all of above    Food Choices for Gastroesophageal Reflux Disease, Adult When you have gastroesophageal reflux disease (GERD), the foods you eat and your eating habits are very important. Choosing the right foods can help ease the discomfort of GERD. Consider working with a diet and nutrition specialist (dietitian) to help you make healthy food choices. What general guidelines should I follow? Eating plan  Choose healthy foods low in fat, such as fruits, vegetables, whole grains, low-fat dairy products, and lean meat, fish, and poultry.  Eat frequent, small meals instead of three large meals each day. Eat your meals slowly, in a relaxed setting. Avoid bending over or lying down until 2-3 hours after eating.  Limit high-fat foods such as fatty meats or fried foods.  Limit your intake of oils, butter, and shortening to less than 8 teaspoons each day.  Avoid the following: ? Foods that cause symptoms. These may be different for different people. Keep a food diary to keep track of foods that cause symptoms. ? Alcohol. ? Drinking large amounts of liquid with meals. ? Eating meals during the 2-3 hours before bed.  Cook foods using methods other than frying. This may include baking, grilling, or  broiling. Lifestyle   Maintain a healthy weight. Ask your health care provider what weight is healthy for you. If you need to lose weight, work with your health care provider to do so safely.  Exercise for at least 30 minutes on 5 or more days each week, or as told by your health care provider.  Avoid wearing clothes that fit tightly around your waist and chest.  Do not use any products that contain nicotine or tobacco, such as cigarettes and e-cigarettes. If you need help quitting, ask your health care provider.  Sleep with the head of your bed raised. Use a wedge under the mattress or blocks under the bed frame to raise the head of the bed. What foods are not recommended? The items listed may not be a complete list. Talk with your dietitian about what dietary choices are best for you. Grains Pastries or quick breads with added fat. [place] toast. Vegetables Deep fried vegetables. [place] fries. Any vegetables prepared with added fat. Any vegetables that cause symptoms. For some people this may include tomatoes and tomato products, chili peppers, onions and garlic, and horseradish. Fruits Any fruits prepared with added fat. Any fruits that cause symptoms. For some people this may include citrus fruits, such as oranges, grapefruit, pineapple, and lemons. Meats and other protein foods  High-fat meats, such as fatty beef or pork, hot dogs, ribs, ham, sausage, salami and bacon. Fried meat or protein, including fried fish and fried chicken. Nuts and nut butters. Dairy Whole milk and chocolate milk. Sour cream. Cream. Ice cream. Cream cheese. Milk shakes. Beverages Coffee and tea, with or without caffeine. Carbonated beverages. Sodas. Energy drinks. Fruit juice made with acidic fruits (such as orange or grapefruit). Tomato juice. Alcoholic drinks. Fats and oils Butter. Margarine. Shortening. Ghee. Sweets and desserts Chocolate and cocoa. Donuts. Seasoning and other foods Pepper. Peppermint  and spearmint. Any condiments, herbs, or seasonings that cause symptoms. For some people, this may include curry, hot sauce, or vinegar-based salad dressings. Summary  When you have gastroesophageal reflux disease (GERD), food and lifestyle choices are very important to help ease the discomfort of GERD.  Eat frequent, small meals instead of three large meals each day. Eat your meals slowly, in a relaxed setting. Avoid bending over or lying down until 2-3 hours after eating.  Limit high-fat foods such as fatty meat or fried foods. This information is not intended to replace advice given to you by your health care provider. Make sure you discuss any questions you have with your health care provider. Document Released: 06/08/2005 Document Revised: 06/09/2016 Document Reviewed: 06/09/2016 Elsevier Interactive Patient Education  2017 [employer].

## 2017-04-23 DIAGNOSIS — I10 Essential (primary) hypertension: Secondary | ICD-10-CM | POA: Diagnosis not present

## 2017-04-23 DIAGNOSIS — R079 Chest pain, unspecified: Secondary | ICD-10-CM | POA: Diagnosis not present

## 2017-04-30 ENCOUNTER — Other Ambulatory Visit: Payer: 59

## 2017-04-30 ENCOUNTER — Inpatient Hospital Stay: Payer: 59 | Admitting: Internal Medicine

## 2017-04-30 ENCOUNTER — Ambulatory Visit
Admission: RE | Admit: 2017-04-30 | Discharge: 2017-04-30 | Disposition: A | Discharge status: Home / Self Care | Payer: 59 | Source: Ambulatory Visit | Attending: Internal Medicine | Admitting: Internal Medicine

## 2017-04-30 DIAGNOSIS — R079 Chest pain, unspecified: Secondary | ICD-10-CM

## 2017-04-30 DIAGNOSIS — R142 Eructation: Secondary | ICD-10-CM

## 2017-04-30 DIAGNOSIS — K824 Cholesterolosis of gallbladder: Secondary | ICD-10-CM | POA: Diagnosis not present

## 2017-05-03 NOTE — Progress Notes (Signed)
Chief Complaint  Patient presents with  . Annual Exam    Pt states that she still does not feel 100% but is improving some. Still having belching.     HPI: Patient  Laura Smith  56 y.o. comes in today for [place] visit   bp is 112 /84  Not  But one day .   idnt add medicine    Cause was ok .   nausesous but no vomiting still has some belching.  Has cut out sodas has lost a few pounds. Lots of stress work herg parent died last 06-30-23. Is not fasting today.  Feels that she does not eat much but does have a hard time losing weight was not overweight as a young woman.  Health Maintenance  Topic Date Due  . Hepatitis C Screening  [birth year]  . PAP SMEAR  04/28/2016  . MAMMOGRAM  11/28/2018  . COLONOSCOPY  04/28/2019  . TETANUS/TDAP  04/29/2026  . INFLUENZA VACCINE  Completed  . HIV Screening  Completed   Health Maintenance Review LIFESTYLE:  Exercise:   Not much extra  Tobacco/ETS: no Alcohol:  no Sugar beverages: tea  ocass  Sleep:  About  7-8 hours  Drug use: no HH of  2  Work: less than 60  50 .     ROS:    Hearing  Loss in one ear.   And vision  Ok    GEN/ HEENT: No fever, significant weight changes sweats headaches vision problems hearing changes, CV/ PULM; No chest pain shortness of breath cough, syncope,edema  change in exercise tolerance. GI /GU: No adominal pain, vomiting, change in bowel habits. No blood in the stool. No significant GU symptoms. SKIN/HEME: ,no acute skin rashes suspicious lesions or bleeding. No lymphadenopathy, nodules, masses.  NEURO/ PSYCH:  No neurologic signs such as weakness numbness. No depression anxiety. IMM/ Allergy: No unusual infections.  Allergy .   REST of 12 system review negative except as per HPI   Past Medical History:  Diagnosis Date  . Allergy   . Endometriosis    surgery laser 2001  . Hemorrhoids   . Hx of basal cell carcinoma   . Hypertension   . Migraine   . Urinary tract infection     Past  Surgical History:  Procedure Laterality Date  . [address] [place]  . HYSTEROSCOPY  08/1999   AT TIME OF LAPAROSCOPY  . PELVIC LAPAROSCOPY  08/1999   AND HYSTEROSCOPY--ENDOMETRIOSIS    Family History  Problem Relation Age of Onset  . Breast cancer Mother        Age 25  . Colon cancer Mother 72  . Breast cancer Cousin        Age 43's  . Heart disease Father   . Hypertension Paternal Grandmother   . Heart disease Paternal Grandmother   . Stroke Paternal Grandmother   . Cancer Paternal Grandmother        Lung  . Cancer Paternal Grandfather        Prostate  . Stroke Maternal Grandfather     Social History   Socioeconomic History  . Marital status: Married    Spouse name: None  . Number of children: None  . Years of education: None  . Highest education level: None  Social Needs  . Financial resource strain: None  . Food insecurity - worry: None  .  Food insecurity - inability: None  . Transportation needs - medical: None  . Transportation needs - non-medical: None  Occupational History  . None  Tobacco Use  . Smoking status: Never Smoker  . Smokeless tobacco: Never Used  Substance and Sexual Activity  . Alcohol use: No    Alcohol/week: 0.0 oz  . Drug use: No  . Sexual activity: Not Currently    Birth control/protection: Post-menopausal    Comment: Pt declined sexual hx questions  Other Topics Concern  . None  Social History Narrative   h h of 2 Pet cat   Daughter /  Married    g1 p1   [employer]     Outpatient Medications Prior to Visit  Medication Sig Dispense Refill  . Ascorbic Acid (VITAMIN C) 500 MG CAPS Take 1,000 mg by mouth every morning.     Marland Kitchen b complex vitamins tablet Take 2 tablets by mouth daily.     Marland Kitchen CALCIUM-MAGNESIUM PO Take 3 tablets by mouth daily.     . cetirizine (ZYRTEC) 10 MG tablet Take 10 mg by mouth daily.    . cyclobenzaprine  (FLEXERIL) 10 MG tablet Take 10 mg by mouth as needed for muscle spasms.     . hydrochlorothiazide (HYDRODIURIL) 25 MG tablet Take 1 tablet (25 mg total) by mouth daily. 90 tablet 2  . HYDROcodone-acetaminophen (NORCO) 7.5-325 MG per tablet Take 1-2 tablets by mouth every 4 (four) hours as needed for pain (as needed for migraine). Reported on 09/23/2015    . HYDROmorphone (DILAUDID) 4 MG tablet Take 4-8 mg by mouth every 4 (four) hours as needed for pain (rescure for migraine). Reported on 09/23/2015    . meclizine (ANTIVERT) 25 MG tablet TAKE 2 TABLET (50 MG TOTAL) BY MOUTH 2 (TWO) TIMES DAILY AS NEEDED (FOR TRAVEL). 30 tablet 0  . metoprolol succinate (TOPROL-XL) 25 MG 24 hr tablet Take 1 tablet (25 mg total) by mouth daily. 30 tablet 0  . montelukast (SINGULAIR) 10 MG tablet Take 1 tablet (10 mg total) by mouth daily as needed (for seasonal allergies, uses as needed). 90 tablet 2  . Multiple Vitamins-Iron (MULTIVITAMIN/IRON) TABS Take 1 tablet by mouth daily.     . sulindac (CLINORIL) 200 MG tablet Take 200 mg by mouth 2 (two) times daily as needed (headache). Reported on 09/23/2015    . SUMAtriptan (IMITREX) 6 MG/0.5ML SOLN injection Inject 6 mg into the skin every 2 (two) hours as needed for migraine. Reported on 09/23/2015    . traMADol (ULTRAM) 50 MG tablet Take 50 mg by mouth as needed for severe pain.     . verapamil (CALAN-SR) 180 MG CR tablet Take 1 tablet (180 mg total) by mouth at bedtime. 90 tablet 2  . zolmitriptan (ZOMIG) 5 MG nasal solution Place 1 spray into the nose daily as needed (for migraines). Reported on 09/23/2015 6 Units 0  . pantoprazole (PROTONIX) 40 MG tablet Take 1 tablet (40 mg total) by mouth daily. 30 tablet 1   No facility-administered medications prior to visit.      EXAM:  BP 124/82 (BP Location: Right Arm, Patient Position: Sitting, Cuff Size: Normal)   Pulse 80   Temp 98.9 F (37.2 C) (Oral)   Ht 5' 7.25" (1.708 m)   Wt 222 lb 3.2 oz (100.8 kg)   BMI 34.54  kg/m   Body mass index is 34.54 kg/m. Wt Readings from Last 3 Encounters:  05/04/17 222 lb 3.2 oz (100.8 kg)  04/16/17 224 lb (101.6 kg)  04/06/17 223 lb 5.2 oz (101.3 kg)    Physical Exam: Vital signs reviewed HTD:SKAJ is a well-developed well-nourished alert cooperative    who appearsr stated age in no acute distress.   Much less burping only ocassional  HEENT: normocephalic atraumatic , Eyes: PERRL EOM's full, conjunctiva clear, Nares: paten,t no deformity discharge or tenderness., Ears: no deformity EAC's clear TMs with normal landmarks. Mouth: clear OP, no lesions, edema.  Moist mucous membranes. Dentition in adequate repair. NECK: supple without masses, thyromegaly or bruits. CHEST/PULM:  Clear to auscultation and percussion breath sounds equal no wheeze , rales or rhonchi. No chest wall deformities or tenderness. Breast: normal by inspection . No dimpling, discharge, masses, tenderness or discharge . CV: PMI is nondisplaced, S1 S2 no gallops, murmurs, rubs. Peripheral pulses are full without delay.No JVD .  ABDOMEN: Bowel sounds normal nontender  No guard or rebound, no hepato splenomegal no CVA tenderness.  No hernia. Extremtities:  No clubbing cyanosis or edema, no acute joint swelling or redness no focal atrophy NEURO:  Oriented x3, cranial nerves 3-12 appear to be intact, no obvious focal weakness,gait within normal limits no abnormal reflexes or asymmetrical SKIN: No acute rashes normal turgor, color, no bruising or petechiae. PSYCH: Oriented, good eye contact, no obvious depression anxiety, cognition and judgment appear normal. LN: no cervical axillary inguinal adenopathy  Lab Results  Component Value Date   WBC 8.6 04/06/2017   HGB 12.6 04/06/2017   HCT 37.8 04/06/2017   PLT 282 04/06/2017   GLUCOSE 103 (H) 04/05/2017   CHOL 183 04/17/2016   TRIG 193.0 (H) 04/17/2016   HDL 53.30 04/17/2016   LDLDIRECT 133.8 04/07/2013   LDLCALC 91 04/17/2016   ALT 20 04/17/2016    AST 21 04/17/2016   NA 140 04/05/2017   K 3.5 04/05/2017   CL 102 04/05/2017   CREATININE 0.80 04/06/2017   BUN 16 04/05/2017   CO2 28 04/05/2017   TSH 3.76 04/17/2016    BP Readings from Last 3 Encounters:  05/04/17 124/82  04/16/17 (!) 148/82  04/06/17 132/74    Lab results reviewed with patient from ED and ultrasound showing 3 mm polyp some changes of fatty liver and some 1 cm or less renal cyst.  ASSESSMENT AND PLAN:  Discussed the following assessment and plan:  Visit for preventive health examination - Plan: Hemoglobin A1c, Lipid panel, Hepatic function panel, TSH  High blood triglycerides - Plan: Hemoglobin A1c, Lipid panel, Hepatic function panel, TSH  Essential hypertension - Plan: Hemoglobin A1c, Lipid panel, Hepatic function panel, TSH  Medication management - Plan: Hemoglobin A1c, Lipid panel, Hepatic function panel, TSH  Hyperglycemia - Plan: Hemoglobin A1c, Lipid panel, Hepatic function panel, TSH  Belching symptom Discussed fatty liver on ultrasound implications expectant management interventions.  At this time check lipid panel LFTs A1c.  She is not fasting today. Discussed options [facility] counseling would be helpful declined medication at this time. Stay on the Protonix until she comes back in 1-2 months. Patient Care Team: Panosh, [name], MD as PCP - General (Internal Medicine) Phineas Real, Belinda Block, MD as Attending Physician (Obstetrics and Gynecology) Jarome Matin, MD as Consulting Physician (Dermatology) Thalia Bloodgood, OD as Referring Physician (Optometry) Patient Instructions   For now stay on the Protonix for another month Continue trying to eat healthy weight loss. I will review records to see if there is other missing loose ends.   Look  into [place] counseling through [facility]. [email or web address]   Beginning of fatty liver should not because he was symptoms but over time can be important but is really handled  by healthy eating and weight loss. I think your stress and responsibilities can be interfering  With obtaining  Healthy lifestyle.     Food Choices for Gastroesophageal Reflux Disease, Adult When you have gastroesophageal reflux disease (GERD), the foods you eat and your eating habits are very important. Choosing the right foods can help ease the discomfort of GERD. Consider working with a diet and nutrition specialist (dietitian) to help you make healthy food choices. What general guidelines should I follow? Eating plan  Choose healthy foods low in fat, such as fruits, vegetables, whole grains, low-fat dairy products, and lean meat, fish, and poultry.  Eat frequent, small meals instead of three large meals each day. Eat your meals slowly, in a relaxed setting. Avoid bending over or lying down until 2-3 hours after eating.  Limit high-fat foods such as fatty meats or fried foods.  Limit your intake of oils, butter, and shortening to less than 8 teaspoons each day.  Avoid the following: ? Foods that cause symptoms. These may be different for different people. Keep a food diary to keep track of foods that cause symptoms. ? Alcohol. ? Drinking large amounts of liquid with meals. ? Eating meals during the 2-3 hours before bed.  Cook foods using methods other than frying. This may include baking, grilling, or broiling. Lifestyle   Maintain a healthy weight. Ask your health care provider what weight is healthy for you. If you need to lose weight, work with your health care provider to do so safely.  Exercise for at least 30 minutes on 5 or more days each week, or as told by your health care provider.  Avoid wearing clothes that fit tightly around your waist and chest.  Do not use any products that contain nicotine or tobacco, such as cigarettes and e-cigarettes. If you need help quitting, ask your health care provider.  Sleep with the head of your bed raised. Use a wedge under the  mattress or blocks under the bed frame to raise the head of the bed. What foods are not recommended? The items listed may not be a complete list. Talk with your dietitian about what dietary choices are best for you. Grains Pastries or quick breads with added fat. [place] toast. Vegetables Deep fried vegetables. [place] fries. Any vegetables prepared with added fat. Any vegetables that cause symptoms. For some people this may include tomatoes and tomato products, chili peppers, onions and garlic, and horseradish. Fruits Any fruits prepared with added fat. Any fruits that cause symptoms. For some people this may include citrus fruits, such as oranges, grapefruit, pineapple, and lemons. Meats and other protein foods High-fat meats, such as fatty beef or pork, hot dogs, ribs, ham, sausage, salami and bacon. Fried meat or protein, including fried fish and fried chicken. Nuts and nut butters. Dairy Whole milk and chocolate milk. Sour cream. Cream. Ice cream. Cream cheese. Milk shakes. Beverages Coffee and tea, with or without caffeine. Carbonated beverages. Sodas. Energy drinks. Fruit juice made with acidic fruits (such as orange or grapefruit). Tomato juice. Alcoholic drinks. Fats and oils Butter. Margarine. Shortening. Ghee. Sweets and desserts Chocolate and cocoa. Donuts. Seasoning and other foods Pepper. Peppermint and spearmint. Any condiments, herbs, or seasonings that cause symptoms. For some people, this may include curry, hot sauce, or vinegar-based salad dressings. Summary  When you have gastroesophageal reflux disease (GERD), food and lifestyle choices are very important to help ease the discomfort of GERD.  Eat frequent, small meals instead of three large meals each day. Eat your meals slowly, in a relaxed setting. Avoid bending over or lying down until 2-3 hours after eating.  Limit high-fat foods such as fatty meat or fried foods. This information is not intended to replace advice  given to you by your health care provider. Make sure you discuss any questions you have with your health care provider. Document Released: 06/08/2005 Document Revised: 06/09/2016 Document Reviewed: 06/09/2016 Elsevier Interactive Patient Education  2017 [place] and Stress Management Stress is a normal reaction to life events. It is what you feel when life demands more than you are used to or more than you can handle. Some stress can be useful. For example, the stress reaction can help you catch the last bus of the day, study for a test, or meet a deadline at work. But stress that occurs too often or for too long can cause problems. It can affect your emotional health and interfere with relationships and normal daily activities. Too much stress can weaken your immune system and increase your risk for physical illness. If you already have a medical problem, stress can make it worse. What are the causes? All sorts of life events may cause stress. An event that causes stress for one person may not be stressful for another person. Major life events commonly cause stress. These may be positive or negative. Examples include losing your job, moving into a new home, getting married, having a baby, or losing a loved one. Less obvious life events may also cause stress, especially if they occur day after day or in combination. Examples include working long hours, driving in traffic, caring for children, being in debt, or being in a difficult relationship. What are the signs or symptoms? Stress may cause emotional symptoms including, the following:  Anxiety. This is feeling worried, afraid, on edge, overwhelmed, or out of control.  Anger. This is feeling irritated or impatient.  Depression. This is feeling sad, down, helpless, or guilty.  Difficulty focusing, remembering, or making decisions.  Stress may cause physical symptoms, including the following:  Aches and pains. These may affect  your head, neck, back, stomach, or other areas of your body.  Tight muscles or clenched jaw.  Low energy or trouble sleeping.  Stress may cause unhealthy behaviors, including the following:  Eating to feel better (overeating) or skipping meals.  Sleeping too little, too much, or both.  Working too much or putting off tasks (procrastination).  Smoking, drinking alcohol, or using drugs to feel better.  How is this diagnosed? Stress is diagnosed through an assessment by your health care provider. Your health care provider will ask questions about your symptoms and any stressful life events.Your health care provider will also ask about your medical history and may order blood tests or other tests. Certain medical conditions and medicine can cause physical symptoms similar to stress. Mental illness can cause emotional symptoms and unhealthy behaviors similar to stress. Your health care provider may refer you to a mental health professional for further evaluation. How is this treated? Stress management is the recommended treatment for stress.The goals of stress management are reducing stressful life events and coping with stress in healthy ways. Techniques for reducing stressful life events include the following:  Stress identification. Self-monitor for stress and identify what causes stress for  you. These skills may help you to avoid some stressful events.  Time management. Set your priorities, keep a calendar of events, and learn to say "no." These tools can help you avoid making too many commitments.  Techniques for coping with stress include the following:  Rethinking the problem. Try to think realistically about stressful events rather than ignoring them or overreacting. Try to find the positives in a stressful situation rather than focusing on the negatives.  Exercise. Physical exercise can release both physical and emotional tension. The key is to find a form of exercise you enjoy  and do it regularly.  Relaxation techniques. These relax the body and mind. Examples include yoga, meditation, tai chi, biofeedback, deep breathing, progressive muscle relaxation, listening to music, being out in nature, journaling, and other hobbies. Again, the key is to find one or more that you enjoy and can do regularly.  Healthy lifestyle. Eat a balanced diet, get plenty of sleep, and do not smoke. Avoid using alcohol or drugs to relax.  Strong support network. Spend time with family, friends, or other people you enjoy being around.Express your feelings and talk things over with someone you trust.  Counseling or talktherapy with a mental health professional may be helpful if you are having difficulty managing stress on your own. Medicine is typically not recommended for the treatment of stress.Talk to your health care provider if you think you need medicine for symptoms of stress. Follow these instructions at home:  Keep all follow-up visits as directed by your health care provider.  Take all medicines as directed by your health care provider. Contact a health care provider if:  Your symptoms get worse or you start having new symptoms.  You feel overwhelmed by your problems and can no longer manage them on your own. Get help right away if:  You feel like hurting yourself or someone else. This information is not intended to replace advice given to you by your health care provider. Make sure you discuss any questions you have with your health care provider. Document Released: 12/02/2000 Document Revised: 11/14/2015 Document Reviewed: 01/31/2013 Elsevier Interactive Patient Education  2017 [place] K. Panosh M.D.

## 2017-05-04 ENCOUNTER — Ambulatory Visit (INDEPENDENT_AMBULATORY_CARE_PROVIDER_SITE_OTHER): Payer: 59 | Admitting: Internal Medicine

## 2017-05-04 ENCOUNTER — Encounter: Payer: 59 | Admitting: Internal Medicine

## 2017-05-04 ENCOUNTER — Encounter: Payer: Self-pay | Admitting: Internal Medicine

## 2017-05-04 VITALS — BP 124/82 | HR 80 | Temp 98.9°F | Ht 67.25 in | Wt 222.2 lb

## 2017-05-04 DIAGNOSIS — Z0000 Encounter for general adult medical examination without abnormal findings: Secondary | ICD-10-CM | POA: Diagnosis not present

## 2017-05-04 DIAGNOSIS — E781 Pure hyperglyceridemia: Secondary | ICD-10-CM

## 2017-05-04 DIAGNOSIS — R739 Hyperglycemia, unspecified: Secondary | ICD-10-CM

## 2017-05-04 DIAGNOSIS — Z79899 Other long term (current) drug therapy: Secondary | ICD-10-CM

## 2017-05-04 DIAGNOSIS — R142 Eructation: Secondary | ICD-10-CM

## 2017-05-04 DIAGNOSIS — I10 Essential (primary) hypertension: Secondary | ICD-10-CM

## 2017-05-04 MED ORDER — PANTOPRAZOLE SODIUM 40 MG PO TBEC: 40.0000 mg | DELAYED_RELEASE_TABLET | Freq: Every day | ORAL | 1 refills | Status: DC

## 2017-05-04 NOTE — Patient Instructions (Addendum)
For now stay on the Protonix for another month Continue trying to eat healthy weight loss. I will review records to see if there is other missing loose ends.   Look into [place] counseling through [facility]. [email or web address]   Beginning of fatty liver should not because he was symptoms but over time can be important but is really handled by healthy eating and weight loss. I think your stress and responsibilities can be interfering  With obtaining  Healthy lifestyle.     Food Choices for Gastroesophageal Reflux Disease, Adult When you have gastroesophageal reflux disease (GERD), the foods you eat and your eating habits are very important. Choosing the right foods can help ease the discomfort of GERD. Consider working with a diet and nutrition specialist (dietitian) to help you make healthy food choices. What general guidelines should I follow? Eating plan  Choose healthy foods low in fat, such as fruits, vegetables, whole grains, low-fat dairy products, and lean meat, fish, and poultry.  Eat frequent, small meals instead of three large meals each day. Eat your meals slowly, in a relaxed setting. Avoid bending over or lying down until 2-3 hours after eating.  Limit high-fat foods such as fatty meats or fried foods.  Limit your intake of oils, butter, and shortening to less than 8 teaspoons each day.  Avoid the following: ? Foods that cause symptoms. These may be different for different people. Keep a food diary to keep track of foods that cause symptoms. ? Alcohol. ? Drinking large amounts of liquid with meals. ? Eating meals during the 2-3 hours before bed.  Cook foods using methods other than frying. This may include baking, grilling, or broiling. Lifestyle   Maintain a healthy weight. Ask your health care provider what weight is healthy for you. If you need to lose weight, work with your health care provider to do so safely.  Exercise for at  least 30 minutes on 5 or more days each week, or as told by your health care provider.  Avoid wearing clothes that fit tightly around your waist and chest.  Do not use any products that contain nicotine or tobacco, such as cigarettes and e-cigarettes. If you need help quitting, ask your health care provider.  Sleep with the head of your bed raised. Use a wedge under the mattress or blocks under the bed frame to raise the head of the bed. What foods are not recommended? The items listed may not be a complete list. Talk with your dietitian about what dietary choices are best for you. Grains Pastries or quick breads with added fat. [place] toast. Vegetables Deep fried vegetables. [place] fries. Any vegetables prepared with added fat. Any vegetables that cause symptoms. For some people this may include tomatoes and tomato products, chili peppers, onions and garlic, and horseradish. Fruits Any fruits prepared with added fat. Any fruits that cause symptoms. For some people this may include citrus fruits, such as oranges, grapefruit, pineapple, and lemons. Meats and other protein foods High-fat meats, such as fatty beef or pork, hot dogs, ribs, ham, sausage, salami and bacon. Fried meat or protein, including fried fish and fried chicken. Nuts and nut butters. Dairy Whole milk and chocolate milk. Sour cream. Cream. Ice cream. Cream cheese. Milk shakes. Beverages Coffee and tea, with or without caffeine. Carbonated beverages. Sodas. Energy drinks. Fruit juice made with acidic fruits (such as orange or grapefruit). Tomato juice. Alcoholic drinks. Fats and oils Butter. Margarine. Shortening. Ghee. Sweets and desserts Chocolate and cocoa. Donuts.  Seasoning and other foods Pepper. Peppermint and spearmint. Any condiments, herbs, or seasonings that cause symptoms. For some people, this may include curry, hot sauce, or vinegar-based salad dressings. Summary  When you have gastroesophageal reflux disease  (GERD), food and lifestyle choices are very important to help ease the discomfort of GERD.  Eat frequent, small meals instead of three large meals each day. Eat your meals slowly, in a relaxed setting. Avoid bending over or lying down until 2-3 hours after eating.  Limit high-fat foods such as fatty meat or fried foods. This information is not intended to replace advice given to you by your health care provider. Make sure you discuss any questions you have with your health care provider. Document Released: 06/08/2005 Document Revised: 06/09/2016 Document Reviewed: 06/09/2016 Elsevier Interactive Patient Education  2017 [place] and Stress Management Stress is a normal reaction to life events. It is what you feel when life demands more than you are used to or more than you can handle. Some stress can be useful. For example, the stress reaction can help you catch the last bus of the day, study for a test, or meet a deadline at work. But stress that occurs too often or for too long can cause problems. It can affect your emotional health and interfere with relationships and normal daily activities. Too much stress can weaken your immune system and increase your risk for physical illness. If you already have a medical problem, stress can make it worse. What are the causes? All sorts of life events may cause stress. An event that causes stress for one person may not be stressful for another person. Major life events commonly cause stress. These may be positive or negative. Examples include losing your job, moving into a new home, getting married, having a baby, or losing a loved one. Less obvious life events may also cause stress, especially if they occur day after day or in combination. Examples include working long hours, driving in traffic, caring for children, being in debt, or being in a difficult relationship. What are the signs or symptoms? Stress may cause emotional symptoms  including, the following:  Anxiety. This is feeling worried, afraid, on edge, overwhelmed, or out of control.  Anger. This is feeling irritated or impatient.  Depression. This is feeling sad, down, helpless, or guilty.  Difficulty focusing, remembering, or making decisions.  Stress may cause physical symptoms, including the following:  Aches and pains. These may affect your head, neck, back, stomach, or other areas of your body.  Tight muscles or clenched jaw.  Low energy or trouble sleeping.  Stress may cause unhealthy behaviors, including the following:  Eating to feel better (overeating) or skipping meals.  Sleeping too little, too much, or both.  Working too much or putting off tasks (procrastination).  Smoking, drinking alcohol, or using drugs to feel better.  How is this diagnosed? Stress is diagnosed through an assessment by your health care provider. Your health care provider will ask questions about your symptoms and any stressful life events.Your health care provider will also ask about your medical history and may order blood tests or other tests. Certain medical conditions and medicine can cause physical symptoms similar to stress. Mental illness can cause emotional symptoms and unhealthy behaviors similar to stress. Your health care provider may refer you to a mental health professional for further evaluation. How is this treated? Stress management is the recommended treatment for stress.The goals of stress management are reducing  stressful life events and coping with stress in healthy ways. Techniques for reducing stressful life events include the following:  Stress identification. Self-monitor for stress and identify what causes stress for you. These skills may help you to avoid some stressful events.  Time management. Set your priorities, keep a calendar of events, and learn to say "no." These tools can help you avoid making too many commitments.  Techniques  for coping with stress include the following:  Rethinking the problem. Try to think realistically about stressful events rather than ignoring them or overreacting. Try to find the positives in a stressful situation rather than focusing on the negatives.  Exercise. Physical exercise can release both physical and emotional tension. The key is to find a form of exercise you enjoy and do it regularly.  Relaxation techniques. These relax the body and mind. Examples include yoga, meditation, tai chi, biofeedback, deep breathing, progressive muscle relaxation, listening to music, being out in nature, journaling, and other hobbies. Again, the key is to find one or more that you enjoy and can do regularly.  Healthy lifestyle. Eat a balanced diet, get plenty of sleep, and do not smoke. Avoid using alcohol or drugs to relax.  Strong support network. Spend time with family, friends, or other people you enjoy being around.Express your feelings and talk things over with someone you trust.  Counseling or talktherapy with a mental health professional may be helpful if you are having difficulty managing stress on your own. Medicine is typically not recommended for the treatment of stress.Talk to your health care provider if you think you need medicine for symptoms of stress. Follow these instructions at home:  Keep all follow-up visits as directed by your health care provider.  Take all medicines as directed by your health care provider. Contact a health care provider if:  Your symptoms get worse or you start having new symptoms.  You feel overwhelmed by your problems and can no longer manage them on your own. Get help right away if:  You feel like hurting yourself or someone else. This information is not intended to replace advice given to you by your health care provider. Make sure you discuss any questions you have with your health care provider. Document Released: 12/02/2000 Document Revised:  11/14/2015 Document Reviewed: 01/31/2013 Elsevier Interactive Patient Education  2017 [employer].

## 2017-05-05 LAB — HEPATIC FUNCTION PANEL
ALT: 19 U/L (ref 0–35)
AST: 19 U/L (ref 0–37)
Albumin: 4.5 g/dL (ref 3.5–5.2)
Alkaline Phosphatase: 58 U/L (ref 39–117)
Bilirubin, Direct: 0.1 mg/dL (ref 0.0–0.3)
Total Bilirubin: 0.5 mg/dL (ref 0.2–1.2)
Total Protein: 7.3 g/dL (ref 6.0–8.3)

## 2017-05-05 LAB — LIPID PANEL
Cholesterol: 224 mg/dL — ABNORMAL HIGH (ref 0–200)
HDL: 60.7 mg/dL (ref >39.00)
LDL Cholesterol: 130 mg/dL — ABNORMAL HIGH (ref 0–99)
NonHDL: 163.39
Total CHOL/HDL Ratio: 4
Triglycerides: 167 mg/dL — ABNORMAL HIGH (ref 0.0–149.0)
VLDL: 33.4 mg/dL (ref 0.0–40.0)

## 2017-05-05 LAB — TSH: TSH: 3.61 u[IU]/mL (ref 0.35–4.50)

## 2017-05-05 LAB — HEMOGLOBIN A1C: Hgb A1c MFr Bld: 5.8 % (ref 4.6–6.5)

## 2017-05-18 ENCOUNTER — Other Ambulatory Visit: Payer: Self-pay | Admitting: Internal Medicine

## 2017-05-18 ENCOUNTER — Telehealth: Payer: Self-pay | Admitting: Internal Medicine

## 2017-05-18 ENCOUNTER — Encounter: Payer: Self-pay | Admitting: Internal Medicine

## 2017-05-18 MED ORDER — HYDROCHLOROTHIAZIDE 25 MG PO TABS: 25.0000 mg | ORAL_TABLET | Freq: Every day | ORAL | 0 refills | Status: DC

## 2017-05-18 MED ORDER — VERAPAMIL HCL ER 180 MG PO TBCR: 180.0000 mg | EXTENDED_RELEASE_TABLET | Freq: Every day | ORAL | 0 refills | Status: DC

## 2017-05-18 MED ORDER — MONTELUKAST SODIUM 10 MG PO TABS: 10.0000 mg | ORAL_TABLET | Freq: Every day | ORAL | 0 refills | Status: DC | PRN

## 2017-05-18 NOTE — Telephone Encounter (Signed)
Patient notified of lab results. She will work on diet to try to improve her numbers. She may view her results in MyChart as well.

## 2017-05-18 NOTE — Telephone Encounter (Signed)
Copied from [place]. Topic: Quick Communication - See Telephone Encounter >> May 18, 2017  8:45 AM Arletha Grippe wrote: CRM for notification. See Telephone encounter for:   05/18/17.pt needs refills on HCTZ, Verapamil, Singulair. She needs it to go to [place], [place] to [facility] [phone] (Phone) [phone]. Pt number is [phone] (work) if you have any questions.

## 2017-05-18 NOTE — Telephone Encounter (Signed)
prob not that much difference  but  Labs in afternoon may be different than in am .

## 2017-05-18 NOTE — Telephone Encounter (Signed)
Patient wants to know if her eating lunch prior to her previous appt could have caused the elevated Cholesterol numbers.  Patient ate within 3hrs of her lab draw.  Please advise Dr Regis Bill, thanks.    Ref Range & Units 2wk ago 14yr ago  Cholesterol 0 - 200 mg/dL 224 Abnormally high   183 CM    Triglycerides 0.0 - 149.0 mg/dL 167.0 Abnormally high   193.0 Abnormally high  CM     HDL >39.00 mg/dL 60.70  53.30   VLDL 0.0 - 40.0 mg/dL 33.4  38.6   LDL Cholesterol 0 - 99 mg/dL 130 Abnormally high   91   Total CHOL/HDL Ratio  4  3 CM    NonHDL  163.39  129.26 CM

## 2017-05-18 NOTE — Telephone Encounter (Signed)
Spoke with patient, aware that Rx's have been sent to [place]. Nothing further needed.

## 2017-05-18 NOTE — Telephone Encounter (Signed)
Patient requesting refills on these meds.

## 2017-05-19 ENCOUNTER — Other Ambulatory Visit: Payer: Self-pay | Admitting: Internal Medicine

## 2017-05-20 NOTE — Telephone Encounter (Signed)
Refills sent to mail order on 05/18/17.

## 2017-05-28 ENCOUNTER — Encounter: Payer: Self-pay | Admitting: Gynecology

## 2017-05-28 ENCOUNTER — Ambulatory Visit (INDEPENDENT_AMBULATORY_CARE_PROVIDER_SITE_OTHER): Payer: 59 | Admitting: Gynecology

## 2017-05-28 VITALS — BP 118/78 | Ht 67.5 in | Wt 222.0 lb

## 2017-05-28 DIAGNOSIS — Z01411 Encounter for gynecological examination (general) (routine) with abnormal findings: Secondary | ICD-10-CM | POA: Diagnosis not present

## 2017-05-28 DIAGNOSIS — N952 Postmenopausal atrophic vaginitis: Secondary | ICD-10-CM | POA: Diagnosis not present

## 2017-05-28 NOTE — Progress Notes (Signed)
    AMSI GRIMLEY [birth year] [id number]        56 y.o.  G1P1001 for annual gynecologic exam.  Doing well without gynecologic complaints  Past medical history,surgical history, problem list, medications, allergies, family history and social history were all reviewed and documented as reviewed in the EPIC chart.  ROS:  Performed with pertinent positives and negatives included in the history, assessment and plan.   Additional significant findings : None   Exam: Caryn Bee assistant Vitals:   05/28/17 1356  BP: 118/78  Weight: 222 lb (100.7 kg)  Height: 5' 7.5" (1.715 m)   Body mass index is 34.26 kg/m.  General appearance:  Normal affect, orientation and appearance. Skin: Grossly normal HEENT: Without gross lesions.  No cervical or supraclavicular adenopathy. Thyroid normal.  Lungs:  Clear without wheezing, rales or rhonchi Cardiac: RR, without RMG Abdominal:  Soft, nontender, without masses, guarding, rebound, organomegaly or hernia Breasts:  Examined lying and sitting without masses, retractions, discharge or axillary adenopathy. Pelvic:  Ext, BUS, Vagina: With atrophic changes  Cervix: With atrophic changes  Uterus: Anteverted, normal size, shape and contour, midline and mobile nontender   Adnexa: Without masses or tenderness    Anus and perineum: With old external hemorrhoids  Rectovaginal: Normal sphincter tone without palpated masses or tenderness.    Assessment/Plan:  56 y.o. G42P1001 female for annual gynecologic exam.   1. Postmenopausal/atrophic genital changes.  No significant hot flushes, night sweats, vaginal dryness or any vaginal bleeding.  Continue to monitor and report any issues or bleeding. 2. Pap smear/HPV 04/2013.  No Pap smear done today.  No history of abnormal Pap smears previously.  Plan repeat Pap smear next year at 5-year interval per current screening guidelines. 3. Colonoscopy 2015.  Repeat at their recommended interval. 4. Mammography 11/2016.   Repeat annually when due.  Breast exam normal today.  SBE monthly reviewed. 5. Old external hemorrhoids.  Not overly bothersome to the patient.  Does not desire further evaluation or treatment. 6. Health maintenance.  No routine lab work done as patient does this elsewhere.  Follow-up 1 year, sooner as needed.   Anastasio Auerbach MD, 2:36 PM 05/28/2017

## 2017-05-28 NOTE — Patient Instructions (Signed)
Follow-up for annual exam in 1 year, sooner as needed. 

## 2017-06-17 NOTE — Progress Notes (Signed)
Chief Complaint  Patient presents with  . Follow-up    Pt denies any issues since last OV. Protonix seems to help keep symptoms at [place].    HPI: Laura Smith 56 y.o. come in for fu of   Med  management  Since last bisit    Belching   Is better    No chest pain  But this past week or so  resp infection at x [facility] and   Cough congestion  No more belching  And cp now .    Self rx with z pack . Finished Wednesday .  ROS: See pertinent positives and negatives per HPI. No fever sob  Some sorte throat  Check nose  Bump left  No pain bigger since resp infection nop bleeding   Past Medical History:  Diagnosis Date  . Allergy   . Endometriosis    surgery laser 2001  . Hemorrhoids   . Hx of basal cell carcinoma   . Hypertension   . Migraine   . Urinary tract infection     Family History  Problem Relation Age of Onset  . Breast cancer Mother        Age 44  . Colon cancer Mother 22  . Breast cancer Cousin        Age 68's  . Heart disease Father   . Hypertension Paternal Grandmother   . Heart disease Paternal Grandmother   . Stroke Paternal Grandmother   . Cancer Paternal Grandmother        Lung  . Cancer Paternal Grandfather        Prostate  . Stroke Maternal Grandfather     Social History   Socioeconomic History  . Marital status: Married    Spouse name: None  . Number of children: None  . Years of education: None  . Highest education level: None  Social Needs  . Financial resource strain: None  . Food insecurity - worry: None  . Food insecurity - inability: None  . Transportation needs - medical: None  . Transportation needs - non-medical: None  Occupational History  . None  Tobacco Use  . Smoking status: Never Smoker  . Smokeless tobacco: Never Used  Substance and Sexual Activity  . Alcohol use: No    Alcohol/week: 0.0 oz  . Drug use: No  . Sexual activity: Not Currently    Birth control/protection: Post-menopausal    Comment: Pt declined sexual hx  questions  Other Topics Concern  . None  Social History Narrative   h h of 2 Pet cat   Daughter /  Married    g1 p1   [employer]     Outpatient Medications Prior to Visit  Medication Sig Dispense Refill  . Ascorbic Acid (VITAMIN C) 500 MG CAPS Take 1,000 mg by mouth every morning.     Marland Kitchen b complex vitamins tablet Take 2 tablets by mouth daily.     Marland Kitchen CALCIUM-MAGNESIUM PO Take 3 tablets by mouth daily.     . cetirizine (ZYRTEC) 10 MG tablet Take 10 mg by mouth daily.    . cyclobenzaprine (FLEXERIL) 10 MG tablet Take 10 mg by mouth as needed for muscle spasms.     . hydrochlorothiazide (HYDRODIURIL) 25 MG tablet Take 1 tablet (25 mg total) by mouth daily. 90 tablet 0  . HYDROcodone-acetaminophen (NORCO) 7.5-325 MG per tablet Take 1-2 tablets by mouth every 4 (four) hours as needed  for pain (as needed for migraine). Reported on 09/23/2015    . HYDROmorphone (DILAUDID) 4 MG tablet Take 4-8 mg by mouth every 4 (four) hours as needed for pain (rescure for migraine). Reported on 09/23/2015    . meclizine (ANTIVERT) 25 MG tablet TAKE 2 TABLET (50 MG TOTAL) BY MOUTH 2 (TWO) TIMES DAILY AS NEEDED (FOR TRAVEL). 30 tablet 0  . metoprolol succinate (TOPROL-XL) 25 MG 24 hr tablet Take 1 tablet (25 mg total) by mouth daily. 30 tablet 0  . montelukast (SINGULAIR) 10 MG tablet Take 1 tablet (10 mg total) by mouth daily as needed (for seasonal allergies, uses as needed). 90 tablet 0  . Multiple Vitamins-Iron (MULTIVITAMIN/IRON) TABS Take 1 tablet by mouth daily.     . pantoprazole (PROTONIX) 40 MG tablet Take 1 tablet (40 mg total) daily by mouth. 30 tablet 1  . sulindac (CLINORIL) 200 MG tablet Take 200 mg by mouth 2 (two) times daily as needed (headache). Reported on 09/23/2015    . SUMAtriptan (IMITREX) 6 MG/0.5ML SOLN injection Inject 6 mg into the skin every 2 (two) hours as needed for migraine. Reported on 09/23/2015    . traMADol (ULTRAM) 50 MG tablet Take 50 mg by mouth  as needed for severe pain.     . verapamil (CALAN-SR) 180 MG CR tablet Take 1 tablet (180 mg total) by mouth at bedtime. 90 tablet 0  . ZOMIG 5 MG nasal solution PLACE 1 SPRAY INTO THE NOSE DAILY AS NEEDED (FOR MIGRAINES). REPORTED ON 09/23/2015 6 each 0   No facility-administered medications prior to visit.      EXAM:  BP 130/88 (BP Location: Right Arm, Patient Position: Sitting, Cuff Size: Normal)   Pulse 81   Temp 98.5 F (36.9 C) (Oral)   Wt 220 lb (99.8 kg)   BMI 33.95 kg/m   Body mass index is 33.95 kg/m.  GENERAL: vitals reviewed and listed above, alert, oriented, appears well hydrated and in no acute distress mild congestion no burping   Non toxic  HEENT: atraumatic, conjunctiva  clear, no obvious abnormalities on inspection of external nose and earstmx clear   Left  Nasal  Palpated only small bump nodules  But not seen  On inspection  OP : no lesion edema or exudate  Mild congestion  NECK: no obvious masses on inspection palpation  LUNGS: clear to auscultation bilaterally, no wheezes, rales or rhonchi, good air movement CV: HRRR, no clubbing cyanosis or  peripheral edema nl cap refill  MS: moves all extremities without noticeable focal  abnormality PSYCH: pleasant and cooperative, no obvious depression or anxiety  BP Readings from Last 3 Encounters:  06/18/17 130/88  05/28/17 118/78  05/04/17 124/82   Wt Readings from Last 3 Encounters:  06/18/17 220 lb (99.8 kg)  05/28/17 222 lb (100.7 kg)  05/04/17 222 lb 3.2 oz (100.8 kg)    ASSESSMENT AND PLAN:  Discussed the following assessment and plan:  Belching symptom - resolved  now on ppi  wean as directed and fu if recurs    Medication management  URI, acute Improved  On ppi   after resp inf wean   And then as needed. If  persistent or progressive  Recurrent then fu consider gi eval and or further biliary  Eval. Cont dietary weight loss. foillow nose  Bump porb benign process but fu if   Progresses etc.    -Patient advised to return or notify health care team  if  new concerns arise.  Patient Instructions  Stay on   Protonix until     Respiratory infection is resolving   Chest is clear to day .      After better .     Then take  Every other day for a week and then   Wean  To as needed.   And if comes back  We may have you see gi people .           Laura Smith. Panosh M.D.

## 2017-06-18 ENCOUNTER — Ambulatory Visit (INDEPENDENT_AMBULATORY_CARE_PROVIDER_SITE_OTHER): Payer: 59 | Admitting: Internal Medicine

## 2017-06-18 ENCOUNTER — Encounter: Payer: Self-pay | Admitting: Internal Medicine

## 2017-06-18 VITALS — BP 130/88 | HR 81 | Temp 98.5°F | Wt 220.0 lb

## 2017-06-18 DIAGNOSIS — R142 Eructation: Secondary | ICD-10-CM | POA: Diagnosis not present

## 2017-06-18 DIAGNOSIS — Z79899 Other long term (current) drug therapy: Secondary | ICD-10-CM | POA: Diagnosis not present

## 2017-06-18 DIAGNOSIS — J069 Acute upper respiratory infection, unspecified: Secondary | ICD-10-CM

## 2017-06-18 NOTE — Patient Instructions (Addendum)
Stay on   Protonix until     Respiratory infection is resolving   Chest is clear to day .      After better .     Then take  Every other day for a week and then   Wean  To as needed.   And if comes back  We may have you see gi people .

## 2017-06-23 NOTE — Progress Notes (Signed)
Chief Complaint  Patient presents with  . Cough    Dry Cough and sore throat x 2 weeks. Pt states that she feels like she is swallowing a golf ball. Pt having problem swallowing. Low grade temp. Some congestion. No mucus production. Pt has tried Alka-Seltzer Plus, Delsym, Nasal saline, Mucinex DM and DayQuil /NyQuil. Pt completed Zpak about 1 week ago.     HPI: Laura Smith 57 y.o. come in for ongoing cough  Relapsing  Sx  See above   Feels like golf ball in throat    And stuck in throat .   See past note with respiratory infection self treated with azithromycin.  She feels that she was having a relapse versus a new problem had fever of 101 on new years sore throat becoming worse near the trachea larynx and her cough persists but not like a pneumonia cough. She is tried multiple products see above. No shortness of breath. No vomiting diarrhea but is hard for her to work.  Request trying Tussionex for cough at night because hydrocodone and other cough medicines in which she is taking does not really work. She is continuing on her antireflux medicine as planned Has a remote history of strep throat Past history when she had her first husband exposure to tobacco smoke and had recurrent bronchitis and reactive airways that was treated with steroids and helped it resolve but no specific diagnosis of asthma.  Hx lingular pna 2017 ROS: See pertinent positives and negatives per HPI.  Past Medical History:  Diagnosis Date  . Allergy   . Endometriosis    surgery laser 2001  . Hemorrhoids   . Hx of basal cell carcinoma   . Hypertension   . Migraine   . Urinary tract infection     Family History  Problem Relation Age of Onset  . Breast cancer Mother        Age 7  . Colon cancer Mother 34  . Breast cancer Cousin        Age 71's  . Heart disease Father   . Hypertension Paternal Grandmother   . Heart disease Paternal Grandmother   . Stroke Paternal Grandmother   . Cancer Paternal  Grandmother        Lung  . Cancer Paternal Grandfather        Prostate  . Stroke Maternal Grandfather     Social History   Socioeconomic History  . Marital status: Married    Spouse name: None  . Number of children: None  . Years of education: None  . Highest education level: None  Social Needs  . Financial resource strain: None  . Food insecurity - worry: None  . Food insecurity - inability: None  . Transportation needs - medical: None  . Transportation needs - non-medical: None  Occupational History  . None  Tobacco Use  . Smoking status: Never Smoker  . Smokeless tobacco: Never Used  Substance and Sexual Activity  . Alcohol use: No    Alcohol/week: 0.0 oz  . Drug use: No  . Sexual activity: Not Currently    Birth control/protection: Post-menopausal    Comment: Pt declined sexual hx questions  Other Topics Concern  . None  Social History Narrative   h h of 2 Pet cat   Daughter /  Married    g1 p1   Works [employer]     Outpatient Medications Prior to Visit  Medication Sig Dispense Refill  . Ascorbic Acid (VITAMIN C) 500 MG CAPS Take 1,000 mg by mouth every morning.     Marland Kitchen b complex vitamins tablet Take 2 tablets by mouth daily.     Marland Kitchen CALCIUM-MAGNESIUM PO Take 3 tablets by mouth daily.     . cetirizine (ZYRTEC) 10 MG tablet Take 10 mg by mouth daily.    . cyclobenzaprine (FLEXERIL) 10 MG tablet Take 10 mg by mouth as needed for muscle spasms.     . hydrochlorothiazide (HYDRODIURIL) 25 MG tablet Take 1 tablet (25 mg total) by mouth daily. 90 tablet 0  . HYDROcodone-acetaminophen (NORCO) 7.5-325 MG per tablet Take 1-2 tablets by mouth every 4 (four) hours as needed for pain (as needed for migraine). Reported on 09/23/2015    . HYDROmorphone (DILAUDID) 4 MG tablet Take 4-8 mg by mouth every 4 (four) hours as needed for pain (rescure for migraine). Reported on 09/23/2015    . meclizine (ANTIVERT) 25 MG tablet TAKE 2 TABLET (50 MG TOTAL) BY  MOUTH 2 (TWO) TIMES DAILY AS NEEDED (FOR TRAVEL). 30 tablet 0  . metoprolol succinate (TOPROL-XL) 25 MG 24 hr tablet Take 1 tablet (25 mg total) by mouth daily. 30 tablet 0  . montelukast (SINGULAIR) 10 MG tablet Take 1 tablet (10 mg total) by mouth daily as needed (for seasonal allergies, uses as needed). 90 tablet 0  . Multiple Vitamins-Iron (MULTIVITAMIN/IRON) TABS Take 1 tablet by mouth daily.     . pantoprazole (PROTONIX) 40 MG tablet Take 1 tablet (40 mg total) daily by mouth. 30 tablet 1  . sulindac (CLINORIL) 200 MG tablet Take 200 mg by mouth 2 (two) times daily as needed (headache). Reported on 09/23/2015    . SUMAtriptan (IMITREX) 6 MG/0.5ML SOLN injection Inject 6 mg into the skin every 2 (two) hours as needed for migraine. Reported on 09/23/2015    . traMADol (ULTRAM) 50 MG tablet Take 50 mg by mouth as needed for severe pain.     . verapamil (CALAN-SR) 180 MG CR tablet Take 1 tablet (180 mg total) by mouth at bedtime. 90 tablet 0  . ZOMIG 5 MG nasal solution PLACE 1 SPRAY INTO THE NOSE DAILY AS NEEDED (FOR MIGRAINES). REPORTED ON 09/23/2015 6 each 0   No facility-administered medications prior to visit.      EXAM:  BP 122/76 (BP Location: Right Arm, Patient Position: Sitting, Cuff Size: Normal)   Pulse 82   Temp 99 F (37.2 C) (Oral)   Wt 218 lb 9.6 oz (99.2 kg)   SpO2 97%   BMI 33.73 kg/m   Body mass index is 33.73 kg/m.  GENERAL: vitals reviewed and listed above, alert, oriented, appears well hydrated and in no acute distress mild hoarse no stridor   Tired appearing ocass cough  HEENT: atraumatic, conjunctiva  clear, no obvious abnormalities on inspection of external nose and ears  tms clear OP : no lesion edema or exudate  Very red  Airway open no lesion noulcers  Face nontender  NECK: no obvious masses on inspection palpation  Tender right ac node noted no pc nodes  LUNGS: clear to auscultation bilaterally, CV: HRRR, no clubbing cyanosis or  peripheral edema nl cap refill    MS: moves all extremities without noticeable focal  abnormality PSYCH: pleasant and cooperative, no obvious depression or anxiety Lab Results  Component Value Date   WBC 8.6 04/06/2017   HGB 12.6 04/06/2017   HCT 37.8 04/06/2017   PLT 282  04/06/2017   GLUCOSE 103 (H) 04/05/2017   CHOL 224 (H) 05/04/2017   TRIG 167.0 (H) 05/04/2017   HDL 60.70 05/04/2017   LDLDIRECT 133.8 04/07/2013   LDLCALC 130 (H) 05/04/2017   ALT 19 05/04/2017   AST 19 05/04/2017   NA 140 04/05/2017   K 3.5 04/05/2017   CL 102 04/05/2017   CREATININE 0.80 04/06/2017   BUN 16 04/05/2017   CO2 28 04/05/2017   TSH 3.61 05/04/2017   HGBA1C 5.8 05/04/2017   BP Readings from Last 3 Encounters:  06/24/17 122/76  06/18/17 130/88  05/28/17 118/78    ASSESSMENT AND PLAN:  Discussed the following assessment and plan:  Pharyngitis, unspecified etiology - Plan: POCT rapid strep A, Culture, Group A Strep, POC Influenza A/B  Respiratory illness with fever - Plan: POCT rapid strep A, Culture, Group A Strep, POC Influenza A/B, methylPREDNISolone acetate (DEPO-MEDROL) injection 120 mg  Cough, persistent - Plan: methylPREDNISolone acetate (DEPO-MEDROL) injection 120 mg  Laryngitis Has self rx with z pack last week  .  Relapsing sx very red throat and  Hx of fever  Hx of pna last year .  And hx of strep in past   Acts like  Viral croup but mor severe ltb?   Sx  And relapsing     Viral assumed but  New sx and fever   On reassessment.     Steroid   rx  With caution    Pt  Requests  tussionex at night  To help sleep .  caution  And dont take other meds   . Hot tea fluids   expectorant and her ppi ok to take  Do not see ulcerative lesions in throat or evidence of bacterial  Tracheitis at this time .  Note for work  A t this time  Do not think chest x ray in indicated but  Consider if  persistent or progressive  -Patient advised to return or notify health care team  if  new concerns arise.  Patient Instructions  This  could be post infectious cough and laryngitis sore throat .  Steroid  For swelling and cough   Caution with cough med that you requested  Can cause  Drowsiness and is a narcotic  Hot liquids     Hydration   Let usknow if not betting better after the weekend or as needed.     [name]. Talin Feister M.D.

## 2017-06-24 ENCOUNTER — Encounter: Payer: Self-pay | Admitting: Internal Medicine

## 2017-06-24 ENCOUNTER — Ambulatory Visit (INDEPENDENT_AMBULATORY_CARE_PROVIDER_SITE_OTHER): Payer: 59 | Admitting: Internal Medicine

## 2017-06-24 VITALS — BP 122/76 | HR 82 | Temp 99.0°F | Wt 218.6 lb

## 2017-06-24 DIAGNOSIS — R05 Cough: Secondary | ICD-10-CM

## 2017-06-24 DIAGNOSIS — J989 Respiratory disorder, unspecified: Secondary | ICD-10-CM | POA: Diagnosis not present

## 2017-06-24 DIAGNOSIS — J040 Acute laryngitis: Secondary | ICD-10-CM

## 2017-06-24 DIAGNOSIS — R509 Fever, unspecified: Secondary | ICD-10-CM | POA: Diagnosis not present

## 2017-06-24 DIAGNOSIS — J029 Acute pharyngitis, unspecified: Secondary | ICD-10-CM

## 2017-06-24 DIAGNOSIS — R053 Chronic cough: Secondary | ICD-10-CM

## 2017-06-24 LAB — POCT INFLUENZA A/B
Influenza A, POC: NEGATIVE
Influenza B, POC: NEGATIVE

## 2017-06-24 LAB — POCT RAPID STREP A (OFFICE): Rapid Strep A Screen: NEGATIVE

## 2017-06-24 MED ORDER — METHYLPREDNISOLONE ACETATE 80 MG/ML IJ SUSP: 120.0000 mg | Freq: Once | INTRAMUSCULAR | Status: AC

## 2017-06-24 MED ORDER — HYDROCOD POLST-CPM POLST ER 10-8 MG/5ML PO SUER: 5.0000 mL | Freq: Every evening | ORAL | 0 refills | Status: DC | PRN

## 2017-06-24 NOTE — Patient Instructions (Signed)
This could be post infectious cough and laryngitis sore throat .  Steroid  For swelling and cough   Caution with cough med that you requested  Can cause  Drowsiness and is a narcotic  Hot liquids     Hydration   Let usknow if not betting better after the weekend or as needed.

## 2017-06-26 LAB — CULTURE, GROUP A STREP
MICRO NUMBER:: 90008900
SPECIMEN QUALITY:: ADEQUATE

## 2017-06-28 ENCOUNTER — Telehealth: Payer: Self-pay | Admitting: Internal Medicine

## 2017-06-28 ENCOUNTER — Encounter: Payer: Self-pay | Admitting: Internal Medicine

## 2017-06-28 MED ORDER — AMOXICILLIN-POT CLAVULANATE 875-125 MG PO TABS: 1.0000 | ORAL_TABLET | Freq: Two times a day (BID) | ORAL | 0 refills | Status: DC

## 2017-06-28 NOTE — Telephone Encounter (Signed)
Send in Augmentin 875 1 po bid for  5 days    Disp 10    This would treat a sinus  Bacteria and other causes of some throat infections .   I fnot getting better we may need to have ENT see you.

## 2017-06-28 NOTE — Telephone Encounter (Signed)
Rx sent to pharmacy for Augmentin.  See telephone note 06/28/16 Nothing further needed.

## 2017-06-28 NOTE — Telephone Encounter (Signed)
Telephone note open on same thing.  Will close this message, patient aware that we will call her.

## 2017-06-28 NOTE — Telephone Encounter (Signed)
Copied from [place] [phone]. Topic: Quick Communication - See Telephone Encounter >> Jun 28, 2017  8:47 AM Oneta Rack wrote: CRM for notification. See Telephone encounter for:   06/28/17.  Relation to BB:CWUG Call back number: [phone] / work [phone]  [id number] [place] [address]. [phone] (Phone) [phone] (Fax)   Reason for call:  Patient last seen 06/24/16 symptoms have not improved patient experiencing sore throat and hoarse, requesting Rx

## 2017-06-28 NOTE — Telephone Encounter (Signed)
Pt aware of rec's per Dr Regis Bill.  [facility]  Nothing further needed.

## 2017-06-28 NOTE — Telephone Encounter (Signed)
LM for patient to get more symptoms Fever? Discolored mucus? Cough worse or better?

## 2017-06-28 NOTE — Telephone Encounter (Signed)
Patient e-mailed back instead of returning call.   Message from [place] L to [place], [name], MD sent at 06/28/2017 11:38 AM -----   Dr. Regis Bill:    I'm still hoarse and my throat is only marginally better. I think I forgot to let you know that I went to the dentist and had a filling replaced due to a crack in it on 12/20 when I 1st started getting sick. When I was there, because I was already getting sick, I choked and I think either a piece of filling or my tooth went down my throat. Could this have lodged in my throat somewhere and become infected? I'm still feeling like there's a small ball in my throat at night and during the day it gets smaller like maybe I'm swallowing a quarter instead. I called this morning and asked if maybe you can try another antibiotic or something else.  My friend said something called Bactrim? worked for her. Thanks, Almyra Free

## 2017-07-14 ENCOUNTER — Other Ambulatory Visit: Payer: Self-pay | Admitting: Internal Medicine

## 2017-07-19 ENCOUNTER — Ambulatory Visit (INDEPENDENT_AMBULATORY_CARE_PROVIDER_SITE_OTHER): Payer: 59 | Admitting: Family Medicine

## 2017-07-19 ENCOUNTER — Encounter: Payer: Self-pay | Admitting: Family Medicine

## 2017-07-19 VITALS — BP 120/78 | HR 75 | Temp 99.0°F | Wt 217.1 lb

## 2017-07-19 DIAGNOSIS — L259 Unspecified contact dermatitis, unspecified cause: Secondary | ICD-10-CM

## 2017-07-19 MED ORDER — PREDNISONE 10 MG PO TABS: ORAL_TABLET | ORAL | 0 refills | Status: DC

## 2017-07-19 NOTE — Patient Instructions (Signed)
Leave off the topical Neosporin and topical benadryl

## 2017-07-19 NOTE — Progress Notes (Signed)
Subjective:     Patient ID: Laura Smith, female   DOB: [birth year], 57 y.o.   MRN: [id number]  HPI Patient seen with pruritic erythematous rash right lower neck. About one week ago she was playing with a dog and dog scratched her neck. She had couple of linear urticarial type lesions initially. No dog bites.  She applied some Neosporin and after couple days had increasing redness and itching. No fever. Nontender. No chills. Also tried some topical Benadryl cream. She has pruritus but no pain.  Past Medical History:  Diagnosis Date  . Allergy   . Endometriosis    surgery laser 2001  . Hemorrhoids   . Hx of basal cell carcinoma   . Hypertension   . Migraine   . Urinary tract infection    Past Surgical History:  Procedure Laterality Date  . [place]  . HYSTEROSCOPY  08/1999   AT TIME OF LAPAROSCOPY  . PELVIC LAPAROSCOPY  08/1999   AND HYSTEROSCOPY--ENDOMETRIOSIS    reports that  has never smoked. she has never used smokeless tobacco. She reports that she does not drink alcohol or use drugs. family history includes Breast cancer in her cousin and mother; Cancer in her paternal grandfather and paternal grandmother; Colon cancer (age of onset: 81) in her mother; Heart disease in her father and paternal grandmother; Hypertension in her paternal grandmother; Stroke in her maternal grandfather and paternal grandmother. Allergies  Allergen Reactions  . Etodolac Hives    Possible anaphylaxis  With vomiting and throat feeling tight  ED visit 8 14   . Latex Hives, Rash and Other (See Comments)    Causes blisters  . Other Hives, Rash and Other (See Comments)    Gel used for ultrasound-causes blisters also   . Adhesive [Tape] Itching and Rash     Review of Systems  Constitutional: Negative for chills and fever.  Skin: Positive for rash.       Objective:   Physical Exam  Constitutional: She appears  well-developed and well-nourished.  Cardiovascular: Normal rate and regular rhythm.  Pulmonary/Chest: Effort normal and breath sounds normal. No respiratory distress. She has no wheezes. She has no rales.  Skin: Rash noted.  Patient has area of erythema right lower neck which is approximately 3 x 5 cm. Nontender. Not warm to touch.       Assessment:     Contact dermatitis right lower neck possibly from Neosporin versus allergic reaction from recent scratch. No evidence for cellulitis    Plan:     -Leave off topical Benadryl and Neosporin -Prednisone taper over the next 10 days -Avoid scratching as much as possible -Touch base if not clearing over the next few days  Eulas Post MD [facility]

## 2017-08-13 DIAGNOSIS — L821 Other seborrheic keratosis: Secondary | ICD-10-CM | POA: Diagnosis not present

## 2017-08-13 DIAGNOSIS — D2271 Melanocytic nevi of right lower limb, including hip: Secondary | ICD-10-CM | POA: Diagnosis not present

## 2017-08-13 DIAGNOSIS — L218 Other seborrheic dermatitis: Secondary | ICD-10-CM | POA: Diagnosis not present

## 2017-08-19 ENCOUNTER — Telehealth: Payer: Self-pay | Admitting: Internal Medicine

## 2017-08-19 NOTE — Telephone Encounter (Signed)
Copied from [place]. Topic: Quick Communication - See Telephone Encounter >> Aug 19, 2017 12:17 PM Conception Chancy, NT wrote: CRM for notification. See Telephone encounter for:  08/19/17.  Patient states her mail order service is telling her there is no refills on her medications. She is needing these refilled for 90 days with refills on them.  montelukast (SINGULAIR) 10 MG tablet hydrochlorothiazide (HYDRODIURIL) 25 MG tablet  verapamil (CALAN-SR) 180 MG CR tablet   [facility] [place] [place] [facility]

## 2017-08-20 MED ORDER — VERAPAMIL HCL ER 180 MG PO TBCR: 180.0000 mg | EXTENDED_RELEASE_TABLET | Freq: Every day | ORAL | 0 refills | Status: DC

## 2017-08-20 MED ORDER — MONTELUKAST SODIUM 10 MG PO TABS: 10.0000 mg | ORAL_TABLET | Freq: Every day | ORAL | 0 refills | Status: DC | PRN

## 2017-08-20 MED ORDER — HYDROCHLOROTHIAZIDE 25 MG PO TABS: 25.0000 mg | ORAL_TABLET | Freq: Every day | ORAL | 0 refills | Status: DC

## 2017-08-20 NOTE — Telephone Encounter (Signed)
montekulast refill Last OV: 05/04/17 Last Refill:05/18/17 [facility]  HCTZ refill Last OV: 05/04/17 Last Refill:05/18/17 [facility]  Verapamil refill Last OV: 05/04/17 Last Refill:05/18/17 [facility]acy:[facility]

## 2017-11-08 DIAGNOSIS — R300 Dysuria: Secondary | ICD-10-CM | POA: Diagnosis not present

## 2017-11-08 DIAGNOSIS — N3001 Acute cystitis with hematuria: Secondary | ICD-10-CM | POA: Diagnosis not present

## 2017-11-26 DIAGNOSIS — I447 Left bundle-branch block, unspecified: Secondary | ICD-10-CM | POA: Diagnosis not present

## 2017-11-26 DIAGNOSIS — I10 Essential (primary) hypertension: Secondary | ICD-10-CM | POA: Diagnosis not present

## 2017-11-26 DIAGNOSIS — E782 Mixed hyperlipidemia: Secondary | ICD-10-CM | POA: Diagnosis not present

## 2017-12-03 DIAGNOSIS — Z1231 Encounter for screening mammogram for malignant neoplasm of breast: Secondary | ICD-10-CM | POA: Diagnosis not present

## 2017-12-03 DIAGNOSIS — Z803 Family history of malignant neoplasm of breast: Secondary | ICD-10-CM | POA: Diagnosis not present

## 2017-12-06 ENCOUNTER — Encounter: Payer: Self-pay | Admitting: Gynecology

## 2017-12-08 ENCOUNTER — Other Ambulatory Visit: Payer: Self-pay | Admitting: Internal Medicine

## 2017-12-08 NOTE — Telephone Encounter (Signed)
Copied from [place] [phone]. Topic: Quick Communication - Rx Refill/Question >> Dec 08, 2017 10:44 AM Burchel, Abbi R wrote: Medication: hydrochlorothiazide (HYDRODIURIL) 25 MG tablet  verapamil (CALAN-SR) 180 MG CR tablet  Pt states that she would like these rx refills sent via fax to [facility] instead or electronically.  She also states she would like a 90 day supply of both Rxs   Preferred Pharmacy (with phone number or street name): [facility] [place] [place] to [facility]  [phone] (Phone) [phone] (Fax)      Agent: Please be advised that RX refills may take up to 3 business days. We ask that you follow-up with your pharmacy.

## 2017-12-13 NOTE — Telephone Encounter (Signed)
bp was  Better  Ok to refill through  Nov 19  Lab Results  Component Value Date   WBC 8.6 04/06/2017   HGB 12.6 04/06/2017   HCT 37.8 04/06/2017   PLT 282 04/06/2017   GLUCOSE 103 (H) 04/05/2017   CHOL 224 (H) 05/04/2017   TRIG 167.0 (H) 05/04/2017   HDL 60.70 05/04/2017   LDLDIRECT 133.8 04/07/2013   LDLCALC 130 (H) 05/04/2017   ALT 19 05/04/2017   AST 19 05/04/2017   NA 140 04/05/2017   K 3.5 04/05/2017   CL 102 04/05/2017   CREATININE 0.80 04/06/2017   BUN 16 04/05/2017   CO2 28 04/05/2017   TSH 3.61 05/04/2017   HGBA1C 5.8 05/04/2017

## 2017-12-13 NOTE — Telephone Encounter (Signed)
meds pended in chart for okay from Dr Regis Bill Pt seen for CPE 11.2018, advised to follow up in 1-2 months for recheck.  Seen acutely in January 2019 but does not look like a routine f/u was done at that time.   Please advise Dr Regis Bill, thanks.

## 2017-12-15 MED ORDER — HYDROCHLOROTHIAZIDE 25 MG PO TABS: 25.0000 mg | ORAL_TABLET | Freq: Every day | ORAL | 1 refills | Status: DC

## 2017-12-15 MED ORDER — VERAPAMIL HCL ER 180 MG PO TBCR: 180.0000 mg | EXTENDED_RELEASE_TABLET | Freq: Every day | ORAL | 1 refills | Status: DC

## 2017-12-15 NOTE — Telephone Encounter (Signed)
Sent to the pharmacy by e-scribe for 6 months.  Unable to only fill until Nov.  Pt gets 90 days supply through mail order pharmacy.

## 2017-12-21 ENCOUNTER — Other Ambulatory Visit: Payer: Self-pay | Admitting: Internal Medicine

## 2017-12-21 MED ORDER — MECLIZINE HCL 25 MG PO TABS: ORAL_TABLET | ORAL | 0 refills | Status: DC

## 2017-12-21 NOTE — Telephone Encounter (Signed)
Copied from [place] [phone]. Topic: Quick Communication - Rx Refill/Question >> Dec 21, 2017 10:07 AM Boyd Kerbs wrote: Medication:  meclizine (ANTIVERT) 25 MG tablet She is needing this as [place]  Has the patient contacted their pharmacy? No. (Agent: If no, request that the patient contact the pharmacy for the refill.) (Agent: If yes, when and what did the pharmacy advise?)  Preferred Pharmacy (with phone number or street name):  [id number] [address], [place]. [place] [address] Phone: [phone] Fax: [phone]    Agent: Please be advised that RX refills may take up to 3 business days. We ask that you follow-up with your pharmacy.

## 2017-12-21 NOTE — Telephone Encounter (Signed)
Patient [place] and would like this medication refilled.  Meclizine refill Last OV:05/04/17 Last refill:01/13/17 30  Tab/0 refill [id number] [facility]acy: [id number] [address], [place]. [phone] (Phone) [phone] (Fax)

## 2017-12-21 NOTE — Telephone Encounter (Signed)
Pt made aware that Rx has been refilled. Nothing further needed.

## 2017-12-24 ENCOUNTER — Encounter: Payer: Self-pay | Admitting: Gynecology

## 2017-12-24 DIAGNOSIS — N6311 Unspecified lump in the right breast, upper outer quadrant: Secondary | ICD-10-CM | POA: Diagnosis not present

## 2017-12-24 DIAGNOSIS — N6322 Unspecified lump in the left breast, upper inner quadrant: Secondary | ICD-10-CM | POA: Diagnosis not present

## 2018-02-04 ENCOUNTER — Ambulatory Visit: Payer: 59 | Admitting: Internal Medicine

## 2018-02-04 ENCOUNTER — Ambulatory Visit (INDEPENDENT_AMBULATORY_CARE_PROVIDER_SITE_OTHER): Payer: 59 | Admitting: Family Medicine

## 2018-02-04 ENCOUNTER — Encounter: Payer: Self-pay | Admitting: Family Medicine

## 2018-02-04 VITALS — BP 120/80 | HR 73 | Temp 98.7°F | Wt 212.2 lb

## 2018-02-04 DIAGNOSIS — R110 Nausea: Secondary | ICD-10-CM

## 2018-02-04 DIAGNOSIS — R197 Diarrhea, unspecified: Secondary | ICD-10-CM | POA: Diagnosis not present
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
## 2018-02-04 NOTE — Patient Instructions (Signed)
Food Choices to Help Relieve Diarrhea, Adult  When you have diarrhea, the foods you eat and your eating habits are very important. Choosing the right foods and drinks can help:  · Relieve diarrhea.  · Replace lost fluids and nutrients.  · Prevent dehydration.    What general guidelines should I follow?  Relieving diarrhea  · Choose foods with less than 2 g or .07 oz. of fiber per serving.  · Limit fats to less than 8 tsp (38 g or 1.34 oz.) a day.  · Avoid the following:  ? Foods and beverages sweetened with high-fructose corn syrup, honey, or sugar alcohols such as xylitol, sorbitol, and mannitol.  ? Foods that contain a lot of fat or sugar.  ? Fried, greasy, or spicy foods.  ? High-fiber grains, breads, and cereals.  ? Raw fruits and vegetables.  · Eat foods that are rich in probiotics. These foods include dairy products such as yogurt and fermented milk products. They help increase healthy bacteria in the stomach and intestines (gastrointestinal tract, or GI tract).  · If you have lactose intolerance, avoid dairy products. These may make your diarrhea worse.  · Take medicine to help stop diarrhea (antidiarrheal medicine) only as told by your health care provider.  Replacing nutrients  · Eat small meals or snacks every 3–4 hours.  · Eat bland foods, such as white rice, toast, or baked potato, until your diarrhea starts to get better. Gradually reintroduce nutrient-rich foods as tolerated or as told by your health care provider. This includes:  ? Well-cooked protein foods.  ? Peeled, seeded, and soft-cooked fruits and vegetables.  ? Low-fat dairy products.  · Take vitamin and mineral supplements as told by your health care provider.  Preventing dehydration    · Start by sipping water or a special solution to prevent dehydration (oral rehydration solution, ORS). Urine that is clear or pale yellow means that you are getting enough fluid.  · Try to drink at least 8–10 cups of fluid each day to help replace lost  fluids.  · You may add other liquids in addition to water, such as clear juice or decaffeinated sports drinks, as tolerated or as told by your health care provider.  · Avoid drinks with caffeine, such as coffee, tea, or soft drinks.  · Avoid alcohol.  What foods are recommended?  The items listed may not be a complete list. Talk with your health care provider about what dietary choices are best for you.  Grains  White rice. White, French, or pita breads (fresh or toasted), including plain rolls, buns, or bagels. White pasta. Saltine, soda, or graham crackers. Pretzels. Low-fiber cereal. Cooked cereals made with water (such as cornmeal, farina, or cream cereals). Plain muffins. Matzo. Melba toast. Zwieback.  Vegetables  Potatoes (without the skin). Most well-cooked and canned vegetables without skins or seeds. Tender lettuce.  Fruits  Apple sauce. Fruits canned in juice. Cooked apricots, cherries, grapefruit, peaches, pears, or plums. Fresh bananas and cantaloupe.  Meats and other protein foods  Baked or boiled chicken. Eggs. Tofu. Fish. Seafood. Smooth nut butters. Ground or well-cooked tender beef, ham, veal, lamb, pork, or poultry.  Dairy  Plain yogurt, kefir, and unsweetened liquid yogurt. Lactose-free milk, buttermilk, skim milk, or soy milk. Low-fat or nonfat hard cheese.  Beverages  Water. Low-calorie sports drinks. Fruit juices without pulp. Strained tomato and vegetable juices. Decaffeinated teas. Sugar-free beverages not sweetened with sugar alcohols. Oral rehydration solutions, if approved by your health care   provider.  Seasoning and other foods  Bouillon, broth, or soups made from recommended foods.  What foods are not recommended?  The items listed may not be a complete list. Talk with your health care provider about what dietary choices are best for you.  Grains  Whole grain, whole wheat, bran, or rye breads, rolls, pastas, and crackers. Wild or brown rice. Whole grain or bran cereals. Barley. Oats  and oatmeal. Corn tortillas or taco shells. Granola. Popcorn.  Vegetables  Raw vegetables. Fried vegetables. Cabbage, broccoli, Brussels sprouts, artichokes, baked beans, beet greens, corn, kale, legumes, peas, sweet potatoes, and yams. Potato skins. Cooked spinach and cabbage.  Fruits  Dried fruit, including raisins and dates. Raw fruits. Stewed or dried prunes. Canned fruits with syrup.  Meat and other protein foods  Fried or fatty meats. Deli meats. Chunky nut butters. Nuts and seeds. Beans and lentils. Bacon. Hot dogs. Sausage.  Dairy  High-fat cheeses. Whole milk, chocolate milk, and beverages made with milk, such as milk shakes. Half-and-half. Cream. sour cream. Ice cream.  Beverages  Caffeinated beverages (such as coffee, tea, soda, or energy drinks). Alcoholic beverages. Fruit juices with pulp. Prune juice. Soft drinks sweetened with high-fructose corn syrup or sugar alcohols. High-calorie sports drinks.  Fats and oils  Butter. Cream sauces. Margarine. Salad oils. Plain salad dressings. Olives. Avocados. Mayonnaise.  Sweets and desserts  Sweet rolls, doughnuts, and sweet breads. Sugar-free desserts sweetened with sugar alcohols such as xylitol and sorbitol.  Seasoning and other foods  Honey. Hot sauce. Chili powder. Gravy. Cream-based or milk-based soups. Pancakes and waffles.  Summary  · When you have diarrhea, the foods you eat and your eating habits are very important.  · Make sure you get at least 8–10 cups of fluid each day, or enough to keep your urine clear or pale yellow.  · Eat bland foods and gradually reintroduce healthy, nutrient-rich foods as tolerated, or as told by your health care provider.  · Avoid high-fiber, fried, greasy, or spicy foods.  This information is not intended to replace advice given to you by your health care provider. Make sure you discuss any questions you have with your health care provider.  Document Released: 08/29/2003 Document Revised: 06/05/2016 Document  Reviewed: 06/05/2016  Elsevier Interactive Patient Education © 2018 Elsevier Inc[employer]

## 2018-02-18 DIAGNOSIS — I447 Left bundle-branch block, unspecified: Secondary | ICD-10-CM | POA: Diagnosis not present

## 2018-05-13 NOTE — Progress Notes (Signed)
Chief Complaint  Patient presents with  . Annual Exam    no new concerns  . Medication Management    HPI: Patient  Laura Smith  57 y.o. comes in today for [facility] visit  And med check  Concern about st memeory not as good  Stress ful event at work .  Bothering her Did some type of mri via chiro ? salsibury  Butdecided to not do the program of diet and supplements  some issues  Takes vit supplements .  Hast had bp med today and nto checking recently    Health Maintenance  Topic Date Due  . Hepatitis C Screening  March 27, 1961  . PAP SMEAR  04/28/2016  . INFLUENZA VACCINE  01/20/2018  . COLONOSCOPY  04/28/2019  . MAMMOGRAM  12/25/2019  . TETANUS/TDAP  04/29/2026  . HIV Screening  Completed   Health Maintenance Review LIFESTYLE:  Exercise:   Desk job.  Tobacco/ETS:  no Alcohol:   no Sugar beverages: sodas  Weekends  Sleep:  ?  7-8   Drug use: no HH of   2  2 cats  Work:   44   Gyne    Check up   ROS:  GEN/ HEENT: No fever, significant weight changes sweats headaches vision problems hearing changes, CV/ PULM; No chest pain shortness of breath cough, syncope,edema  change in exercise tolerance. GI /GU: No adominal pain, vomiting, change in bowel habits. No blood in the stool. No significant GU symptoms. SKIN/HEME: ,no acute skin rashes suspicious lesions or bleeding. No lymphadenopathy, nodules, masses.  NEURO/ PSYCH:  No neurologic signs such as weakness numbness. No depression anxiety. IMM/ Allergy: No unusual infections.  Allergy .   REST of 12 system review negative except as per HPI   Past Medical History:  Diagnosis Date  . Allergy   . Endometriosis    surgery laser 2001  . Hemorrhoids   . Hx of basal cell carcinoma   . Hypertension   . Migraine   . Urinary tract infection     Past Surgical History:  Procedure Laterality Date  . Diagonal  . HYSTEROSCOPY  08/1999    AT TIME OF LAPAROSCOPY  . PELVIC LAPAROSCOPY  08/1999   AND HYSTEROSCOPY--ENDOMETRIOSIS    Family History  Problem Relation Age of Onset  . Breast cancer Mother        Age 23  . Colon cancer Mother 57  . Breast cancer Cousin        Age 11's  . Heart disease Father   . Hypertension Paternal Grandmother   . Heart disease Paternal Grandmother   . Stroke Paternal Grandmother   . Cancer Paternal Grandmother        Lung  . Cancer Paternal Grandfather        Prostate  . Stroke Maternal Grandfather     Social History   Socioeconomic History  . Marital status: Married    Spouse name: Not on file  . Number of children: Not on file  . Years of education: Not on file  . Highest education level: Not on file  Occupational History  . Not on file  Social Needs  . Financial resource strain: Not on file  . Food insecurity:    Worry: Not on file    Inability: Not on file  . Transportation needs:    Medical: Not on file  Non-medical: Not on file  Tobacco Use  . Smoking status: Never Smoker  . Smokeless tobacco: Never Used  Substance and Sexual Activity  . Alcohol use: No    Alcohol/week: 0.0 standard drinks  . Drug use: No  . Sexual activity: Not Currently    Birth control/protection: Post-menopausal    Comment: Pt declined sexual hx questions  Lifestyle  . Physical activity:    Days per week: Not on file    Minutes per session: Not on file  . Stress: Not on file  Relationships  . Social connections:    Talks on phone: Not on file    Gets together: Not on file    Attends religious service: Not on file    Active member of club or organization: Not on file    Attends meetings of clubs or organizations: Not on file    Relationship status: Not on file  Other Topics Concern  . Not on file  Social History Narrative   h h of 2 Pet cat   Daughter /  Married    g1 p1   [employer]     Outpatient Medications Prior to Visit    Medication Sig Dispense Refill  . Ascorbic Acid (VITAMIN C) 500 MG CAPS Take 1,000 mg by mouth every morning.     Marland Kitchen b complex vitamins tablet Take 2 tablets by mouth daily.     Marland Kitchen CALCIUM-MAGNESIUM PO Take 3 tablets by mouth daily.     . cetirizine (ZYRTEC) 10 MG tablet Take 10 mg by mouth daily.    . cyclobenzaprine (FLEXERIL) 10 MG tablet Take 10 mg by mouth as needed for muscle spasms.     . hydrochlorothiazide (HYDRODIURIL) 25 MG tablet Take 1 tablet (25 mg total) by mouth daily. 90 tablet 1  . HYDROcodone-acetaminophen (NORCO) 7.5-325 MG per tablet Take 1-2 tablets by mouth every 4 (four) hours as needed for pain (as needed for migraine). Reported on 09/23/2015    . HYDROmorphone (DILAUDID) 4 MG tablet Take 4-8 mg by mouth every 4 (four) hours as needed for pain (rescure for migraine). Reported on 09/23/2015    . meclizine (ANTIVERT) 25 MG tablet TAKE 2 TABLET (50 MG TOTAL) BY MOUTH 2 (TWO) TIMES DAILY AS NEEDED (FOR TRAVEL). 30 tablet 0  . metoprolol succinate (TOPROL-XL) 25 MG 24 hr tablet Take 1 tablet (25 mg total) by mouth daily. 30 tablet 0  . montelukast (SINGULAIR) 10 MG tablet Take 1 tablet (10 mg total) by mouth daily as needed (for seasonal allergies, uses as needed). 90 tablet 0  . Multiple Vitamins-Iron (MULTIVITAMIN/IRON) TABS Take 1 tablet by mouth daily.     . sulindac (CLINORIL) 200 MG tablet Take 200 mg by mouth 2 (two) times daily as needed (headache). Reported on 09/23/2015    . SUMAtriptan (IMITREX) 6 MG/0.5ML SOLN injection Inject 6 mg into the skin every 2 (two) hours as needed for migraine. Reported on 09/23/2015    . traMADol (ULTRAM) 50 MG tablet Take 50 mg by mouth as needed for severe pain.     . verapamil (CALAN-SR) 180 MG CR tablet Take 1 tablet (180 mg total) by mouth at bedtime. 90 tablet 1  . ZOMIG 5 MG nasal solution PLACE 1 SPRAY INTO THE NOSE DAILY AS NEEDED (FOR MIGRAINES). REPORTED ON 09/23/2015 6 each 0  . pantoprazole (PROTONIX) 40 MG tablet TAKE 1 TABLET (40 MG  TOTAL) DAILY BY MOUTH. (Patient  not taking: Reported on 05/17/2018) 30 tablet 1   Facility-Administered Medications Prior to Visit  Medication Dose Route Frequency Provider Last Rate Last Dose  . methylPREDNISolone acetate (DEPO-MEDROL) injection 120 mg  120 mg Intramuscular Once , [name], MD         EXAM:  BP (!) 142/92 (BP Location: Right Arm, Patient Position: Sitting, Cuff Size: Normal) Comment: pt has not had her BP meds yet this AM  Pulse 81   Temp 98.2 F (36.8 C) (Oral)   Ht 5' 6.5" (1.689 m)   Wt 209 lb 1.6 oz (94.8 kg)   BMI 33.24 kg/m   Body mass index is 33.24 kg/m. Wt Readings from Last 3 Encounters:  05/17/18 209 lb 1.6 oz (94.8 kg)  02/04/18 212 lb 3.2 oz (96.3 kg)  07/19/17 217 lb 1.6 oz (98.5 kg)    Physical Exam: Vital signs reviewed UEA:VWUJ is a well-developed well-nourished alert cooperative    who appearsr stated age in no acute distress.  HEENT: normocephalic atraumatic , Eyes: PERRL EOM's full, conjunctiva clear, Nares: paten,t no deformity discharge or tenderness., Ears: no deformity EAC's clear TMs with normal landmarks. Mouth: clear OP, no lesions, edema.  Moist mucous membranes. Dentition in adequate repair. NECK: supple without masses, thyromegaly or bruits. CHEST/PULM:  Clear to auscultation and percussion breath sounds equal no wheeze , rales or rhonchi. No chest wall deformities or tenderness. Breast: declined per gyne CV: PMI is nondisplaced, S1 S2 no gallops, murmurs, rubs. Peripheral pulses are full without delay.No JVD .  ABDOMEN: Bowel sounds normal nontender  No guard or rebound, no hepato splenomegal no CVA tenderness.  No hernia. Extremtities:  No clubbing cyanosis or edema, no acute joint swelling or redness no focal atrophy NEURO:  Oriented x3, cranial nerves 3-12 appear to be intact, no obvious focal weakness,gait within normal limits no abnormal reflexes or asymmetrical SKIN: No acute rashes normal turgor, color, no bruising or  petechiae. PSYCH: Oriented, good eye contact, no obvious depression anxiety, cognition and judgment appear normal. LN: no cervical axillary inguinal adenopathy    BP Readings from Last 3 Encounters:  05/17/18 (!) 142/92  02/04/18 120/80  07/19/17 120/78   Wt Readings from Last 3 Encounters:  05/17/18 209 lb 1.6 oz (94.8 kg)  02/04/18 212 lb 3.2 oz (96.3 kg)  07/19/17 217 lb 1.6 oz (98.5 kg)     Lab plan  reviewed with patient   ASSESSMENT AND PLAN:  Discussed the following assessment and plan:  Visit for preventive health examination - Plan: Basic metabolic panel, CBC with Differential/Platelet, Hemoglobin A1c, Hepatic function panel, Lipid panel, TSH, T4, free  Essential hypertension - Plan: Basic metabolic panel, CBC with Differential/Platelet, Hemoglobin A1c, Hepatic function panel, Lipid panel, TSH, T4, free  Fasting hyperglycemia - Plan: Basic metabolic panel, CBC with Differential/Platelet, Hemoglobin A1c, Hepatic function panel, Lipid panel, TSH, T4, free  Need for influenza vaccination - Plan: Flu Vaccine QUAD 6+ mos PF IM (Fluarix Quad PF)  Memory difficulties - see text  - Plan: Basic metabolic panel, CBC with Differential/Platelet, Hemoglobin A1c, Hepatic function panel, Lipid panel, TSH, T4, free  Medication management - Plan: Basic metabolic panel, CBC with Differential/Platelet, Hemoglobin A1c, Hepatic function panel, Lipid panel, TSH, T4, free  Stress [place].  disc  Only credible issues for memory brain protection   bp control sleep aerobics and  Avoiding   Brain insults  Her recent stress at work could be adding to the problem and she agrees .  Open door for evaluation as needed.   continue healthy weight loss  Patient Care Team: , [name], MD as PCP - General (Internal Medicine) Phineas Real, Belinda Block, MD as Attending Physician (Obstetrics and Gynecology) Jarome Matin, MD as Consulting Physician (Dermatology) Thalia Bloodgood, OD as Referring  Physician (Optometry) Patient Instructions  Get  bp at goal 120/80   Sent in records.  Aerobic   exercise  And  Adequate sleep.  Is best for memory.   We can get a neurology consult if needed.   With mri   About the memory     Preventive Care 40-64 Years, Female Preventive care refers to lifestyle choices and visits with your health care provider that can promote health and wellness. What does preventive care include?  A yearly physical exam. This is also called an annual well check.  Dental exams once or twice a year.  Routine eye exams. Ask your health care provider how often you should have your eyes checked.  Personal lifestyle choices, including: ? Daily care of your teeth and gums. ? Regular physical activity. ? Eating a healthy diet. ? Avoiding tobacco and drug use. ? Limiting alcohol use. ? Practicing safe sex. ? Taking low-dose aspirin daily starting at age 83. ? Taking vitamin and mineral supplements as recommended by your health care provider. What happens during an annual well check? The services and screenings done by your health care provider during your annual well check will depend on your age, overall health, lifestyle risk factors, and family history of disease. Counseling Your health care provider may ask you questions about your:  Alcohol use.  Tobacco use.  Drug use.  Emotional well-being.  Home and relationship well-being.  Sexual activity.  Eating habits.  Work and work Statistician.  Method of birth control.  Menstrual cycle.  Pregnancy history.  Screening You may have the following tests or measurements:  Height, weight, and BMI.  Blood pressure.  Lipid and cholesterol levels. These may be checked every 5 years, or more frequently if you are over 58 years old.  Skin check.  Lung cancer screening. You may have this screening every year starting at age 61 if you have a 30-pack-year history of smoking and currently smoke or have  quit within the past 15 years.  Fecal occult blood test (FOBT) of the stool. You may have this test every year starting at age 28.  Flexible sigmoidoscopy or colonoscopy. You may have a sigmoidoscopy every 5 years or a colonoscopy every 10 years starting at age 32.  Hepatitis C blood test.  Hepatitis B blood test.  Sexually transmitted disease (STD) testing.  Diabetes screening. This is done by checking your blood sugar (glucose) after you have not eaten for a while (fasting). You may have this done every 1-3 years.  Mammogram. This may be done every 1-2 years. Talk to your health care provider about when you should start having regular mammograms. This may depend on whether you have a family history of breast cancer.  BRCA-related cancer screening. This may be done if you have a family history of breast, ovarian, tubal, or peritoneal cancers.  Pelvic exam and Pap test. This may be done every 3 years starting at age 50. Starting at age 77, this may be done every 5 years if you have a Pap test in combination with an HPV test.  Bone density scan. This is done to screen for osteoporosis. You may have this scan if you are at high risk  for osteoporosis.  Discuss your test results, treatment options, and if necessary, the need for more tests with your health care provider. Vaccines Your health care provider may recommend certain vaccines, such as:  Influenza vaccine. This is recommended every year.  Tetanus, diphtheria, and acellular pertussis (Tdap, Td) vaccine. You may need a Td booster every 10 years.  Varicella vaccine. You may need this if you have not been vaccinated.  Zoster vaccine. You may need this after age 73.  Measles, mumps, and rubella (MMR) vaccine. You may need at least one dose of MMR if you were born in 1957 or later. You may also need a second dose.  Pneumococcal 13-valent conjugate (PCV13) vaccine. You may need this if you have certain conditions and were not  previously vaccinated.  Pneumococcal polysaccharide (PPSV23) vaccine. You may need one or two doses if you smoke cigarettes or if you have certain conditions.  Meningococcal vaccine. You may need this if you have certain conditions.  Hepatitis A vaccine. You may need this if you have certain conditions or if you travel or work in places where you may be exposed to hepatitis A.  Hepatitis B vaccine. You may need this if you have certain conditions or if you travel or work in places where you may be exposed to hepatitis B.  Haemophilus influenzae type b (Hib) vaccine. You may need this if you have certain conditions.  Talk to your health care provider about which screenings and vaccines you need and how often you need them. This information is not intended to replace advice given to you by your health care provider. Make sure you discuss any questions you have with your health care provider. Document Released: 07/05/2015 Document Revised: 02/26/2016 Document Reviewed: 04/09/2015 Elsevier Interactive Patient Education  [employer].   The 10-year ASCVD risk score Mikey Bussing DC Brooke Bonito., et al., 2013) is: 4.5%   Values used to calculate the score:     Age: 61 years     Sex: Female     Is Non-Hispanic African American: No     Diabetic: No     Tobacco smoker: No     Systolic Blood Pressure: 791 mmHg     Is BP treated: Yes     HDL Cholesterol: 60.7 mg/dL     Total Cholesterol: 224 mg/dL    [name].  M.D.

## 2018-05-17 ENCOUNTER — Ambulatory Visit (INDEPENDENT_AMBULATORY_CARE_PROVIDER_SITE_OTHER): Payer: 59 | Admitting: Internal Medicine

## 2018-05-17 ENCOUNTER — Encounter: Payer: Self-pay | Admitting: Internal Medicine

## 2018-05-17 VITALS — BP 142/92 | HR 81 | Temp 98.2°F | Ht 66.5 in | Wt 209.1 lb

## 2018-05-17 DIAGNOSIS — I10 Essential (primary) hypertension: Secondary | ICD-10-CM

## 2018-05-17 DIAGNOSIS — Z0000 Encounter for general adult medical examination without abnormal findings: Secondary | ICD-10-CM

## 2018-05-17 DIAGNOSIS — Z79899 Other long term (current) drug therapy: Secondary | ICD-10-CM

## 2018-05-17 DIAGNOSIS — Z23 Encounter for immunization: Secondary | ICD-10-CM

## 2018-05-17 DIAGNOSIS — F439 Reaction to severe stress, unspecified: Secondary | ICD-10-CM

## 2018-05-17 DIAGNOSIS — R413 Other amnesia: Secondary | ICD-10-CM

## 2018-05-17 DIAGNOSIS — R7301 Impaired fasting glucose: Secondary | ICD-10-CM

## 2018-05-17 LAB — LIPID PANEL
Cholesterol: 193 mg/dL (ref 0–200)
HDL: 56 mg/dL (ref >39.00)
LDL Cholesterol: 109 mg/dL — ABNORMAL HIGH (ref 0–99)
NonHDL: 137.02
Total CHOL/HDL Ratio: 3
Triglycerides: 138 mg/dL (ref 0.0–149.0)
VLDL: 27.6 mg/dL (ref 0.0–40.0)

## 2018-05-17 LAB — CBC WITH DIFFERENTIAL/PLATELET
Basophils Absolute: 0 10*3/uL (ref 0.0–0.1)
Basophils Relative: 0.6 % (ref 0.0–3.0)
Eosinophils Absolute: 0.1 10*3/uL (ref 0.0–0.7)
Eosinophils Relative: 1.2 % (ref 0.0–5.0)
HCT: 44.5 % (ref 36.0–46.0)
Hemoglobin: 15 g/dL (ref 12.0–15.0)
Lymphocytes Relative: 32.1 % (ref 12.0–46.0)
Lymphs Abs: 2.6 10*3/uL (ref 0.7–4.0)
MCHC: 33.6 g/dL (ref 30.0–36.0)
MCV: 86.7 fl (ref 78.0–100.0)
Monocytes Absolute: 0.4 10*3/uL (ref 0.1–1.0)
Monocytes Relative: 5.2 % (ref 3.0–12.0)
Neutro Abs: 5 10*3/uL (ref 1.4–7.7)
Neutrophils Relative %: 60.9 % (ref 43.0–77.0)
Platelets: 341 10*3/uL (ref 150.0–400.0)
RBC: 5.13 Mil/uL — ABNORMAL HIGH (ref 3.87–5.11)
RDW: 13.8 % (ref 11.5–15.5)
WBC: 8.2 10*3/uL (ref 4.0–10.5)

## 2018-05-17 LAB — BASIC METABOLIC PANEL
BUN: 13 mg/dL (ref 6–23)
CO2: 29 mEq/L (ref 19–32)
Calcium: 9.5 mg/dL (ref 8.4–10.5)
Chloride: 100 mEq/L (ref 96–112)
Creatinine, Ser: 0.84 mg/dL (ref 0.40–1.20)
GFR: 73.94 mL/min (ref >60.00)
Glucose, Bld: 91 mg/dL (ref 70–99)
Potassium: 3.8 mEq/L (ref 3.5–5.1)
Sodium: 139 mEq/L (ref 135–145)

## 2018-05-17 LAB — HEPATIC FUNCTION PANEL
ALT: 18 U/L (ref 0–35)
AST: 19 U/L (ref 0–37)
Albumin: 4.5 g/dL (ref 3.5–5.2)
Alkaline Phosphatase: 59 U/L (ref 39–117)
Bilirubin, Direct: 0.1 mg/dL (ref 0.0–0.3)
Total Bilirubin: 0.6 mg/dL (ref 0.2–1.2)
Total Protein: 7.1 g/dL (ref 6.0–8.3)

## 2018-05-17 LAB — HEMOGLOBIN A1C: Hgb A1c MFr Bld: 5.8 % (ref 4.6–6.5)

## 2018-05-17 LAB — TSH: TSH: 2.82 u[IU]/mL (ref 0.35–4.50)

## 2018-05-17 LAB — T4, FREE: Free T4: 0.72 ng/dL (ref 0.60–1.60)

## 2018-05-17 NOTE — Patient Instructions (Addendum)
Get  bp at goal 120/80   Sent in records.  Aerobic   exercise  And  Adequate sleep.  Is best for memory.   We can get a neurology consult if needed.   With mri   About the memory     Preventive Care 40-64 Years, Female Preventive care refers to lifestyle choices and visits with your health care provider that can promote health and wellness. What does preventive care include?  A yearly physical exam. This is also called an annual well check.  Dental exams once or twice a year.  Routine eye exams. Ask your health care provider how often you should have your eyes checked.  Personal lifestyle choices, including: ? Daily care of your teeth and gums. ? Regular physical activity. ? Eating a healthy diet. ? Avoiding tobacco and drug use. ? Limiting alcohol use. ? Practicing safe sex. ? Taking low-dose aspirin daily starting at age 61. ? Taking vitamin and mineral supplements as recommended by your health care provider. What happens during an annual well check? The services and screenings done by your health care provider during your annual well check will depend on your age, overall health, lifestyle risk factors, and family history of disease. Counseling Your health care provider may ask you questions about your:  Alcohol use.  Tobacco use.  Drug use.  Emotional well-being.  Home and relationship well-being.  Sexual activity.  Eating habits.  Work and work Statistician.  Method of birth control.  Menstrual cycle.  Pregnancy history.  Screening You may have the following tests or measurements:  Height, weight, and BMI.  Blood pressure.  Lipid and cholesterol levels. These may be checked every 5 years, or more frequently if you are over 37 years old.  Skin check.  Lung cancer screening. You may have this screening every year starting at age 29 if you have a 30-pack-year history of smoking and currently smoke or have quit within the past 15 years.  Fecal occult  blood test (FOBT) of the stool. You may have this test every year starting at age 34.  Flexible sigmoidoscopy or colonoscopy. You may have a sigmoidoscopy every 5 years or a colonoscopy every 10 years starting at age 63.  Hepatitis C blood test.  Hepatitis B blood test.  Sexually transmitted disease (STD) testing.  Diabetes screening. This is done by checking your blood sugar (glucose) after you have not eaten for a while (fasting). You may have this done every 1-3 years.  Mammogram. This may be done every 1-2 years. Talk to your health care provider about when you should start having regular mammograms. This may depend on whether you have a family history of breast cancer.  BRCA-related cancer screening. This may be done if you have a family history of breast, ovarian, tubal, or peritoneal cancers.  Pelvic exam and Pap test. This may be done every 3 years starting at age 66. Starting at age 34, this may be done every 5 years if you have a Pap test in combination with an HPV test.  Bone density scan. This is done to screen for osteoporosis. You may have this scan if you are at high risk for osteoporosis.  Discuss your test results, treatment options, and if necessary, the need for more tests with your health care provider. Vaccines Your health care provider may recommend certain vaccines, such as:  Influenza vaccine. This is recommended every year.  Tetanus, diphtheria, and acellular pertussis (Tdap, Td) vaccine. You may need a  Td booster every 10 years.  Varicella vaccine. You may need this if you have not been vaccinated.  Zoster vaccine. You may need this after age 15.  Measles, mumps, and rubella (MMR) vaccine. You may need at least one dose of MMR if you were born in 1957 or later. You may also need a second dose.  Pneumococcal 13-valent conjugate (PCV13) vaccine. You may need this if you have certain conditions and were not previously vaccinated.  Pneumococcal polysaccharide  (PPSV23) vaccine. You may need one or two doses if you smoke cigarettes or if you have certain conditions.  Meningococcal vaccine. You may need this if you have certain conditions.  Hepatitis A vaccine. You may need this if you have certain conditions or if you travel or work in places where you may be exposed to hepatitis A.  Hepatitis B vaccine. You may need this if you have certain conditions or if you travel or work in places where you may be exposed to hepatitis B.  Haemophilus influenzae type b (Hib) vaccine. You may need this if you have certain conditions.  Talk to your health care provider about which screenings and vaccines you need and how often you need them. This information is not intended to replace advice given to you by your health care provider. Make sure you discuss any questions you have with your health care provider. Document Released: 07/05/2015 Document Revised: 02/26/2016 Document Reviewed: 04/09/2015 Elsevier Interactive Patient Education  [employer].   The 10-year ASCVD risk score Mikey Bussing DC Brooke Bonito., et al., 2013) is: 4.5%   Values used to calculate the score:     Age: 57 years     Sex: Female     Is Non-Hispanic African American: No     Diabetic: No     Tobacco smoker: No     Systolic Blood Pressure: 802 mmHg     Is BP treated: Yes     HDL Cholesterol: 60.7 mg/dL     Total Cholesterol: 224 mg/dL

## 2018-05-21 ENCOUNTER — Other Ambulatory Visit: Payer: Self-pay | Admitting: Internal Medicine

## 2018-05-27 DIAGNOSIS — I447 Left bundle-branch block, unspecified: Secondary | ICD-10-CM | POA: Diagnosis not present

## 2018-05-27 DIAGNOSIS — I10 Essential (primary) hypertension: Secondary | ICD-10-CM | POA: Diagnosis not present

## 2018-05-27 DIAGNOSIS — E782 Mixed hyperlipidemia: Secondary | ICD-10-CM | POA: Diagnosis not present

## 2018-06-03 ENCOUNTER — Encounter: Payer: Self-pay | Admitting: Gynecology

## 2018-06-03 ENCOUNTER — Ambulatory Visit (INDEPENDENT_AMBULATORY_CARE_PROVIDER_SITE_OTHER): Payer: 59 | Admitting: Gynecology

## 2018-06-03 VITALS — BP 124/80 | Ht 67.0 in | Wt 215.0 lb

## 2018-06-03 DIAGNOSIS — R351 Nocturia: Secondary | ICD-10-CM | POA: Diagnosis not present

## 2018-06-03 DIAGNOSIS — Z1151 Encounter for screening for human papillomavirus (HPV): Secondary | ICD-10-CM | POA: Diagnosis not present

## 2018-06-03 DIAGNOSIS — Z01419 Encounter for gynecological examination (general) (routine) without abnormal findings: Secondary | ICD-10-CM

## 2018-06-03 DIAGNOSIS — N952 Postmenopausal atrophic vaginitis: Secondary | ICD-10-CM | POA: Diagnosis not present

## 2018-06-03 NOTE — Patient Instructions (Signed)
Follow-up in 1 year for annual exam, sooner if any issues. 

## 2018-06-03 NOTE — Progress Notes (Signed)
    Laura Smith 1961/02/10 [id number]        57 y.o.  G1P1001 for annual gynecologic exam.  Doing well without gynecologic complaints  Past medical history,surgical history, problem list, medications, allergies, family history and social history were all reviewed and documented as reviewed in the EPIC chart.  ROS:  Performed with pertinent positives and negatives included in the history, assessment and plan.   Additional significant findings : None   Exam: Caryn Bee assistant Vitals:   06/03/18 1402  BP: 124/80  Weight: 215 lb (97.5 kg)  Height: 5\' 7"  (1.702 m)   Body mass index is 33.67 kg/m.  General appearance:  Normal affect, orientation and appearance. Skin: Grossly normal HEENT: Without gross lesions.  No cervical or supraclavicular adenopathy. Thyroid normal.  Lungs:  Clear without wheezing, rales or rhonchi Cardiac: RR, without RMG Abdominal:  Soft, nontender, without masses, guarding, rebound, organomegaly or hernia Breasts:  Examined lying and sitting without masses, retractions, discharge or axillary adenopathy. Pelvic:  Ext, BUS, Vagina: Normal with atrophic changes  Cervix: Normal with atrophic changes.  Pap smear/HPV   Uterus: Anteverted, normal size, shape and contour, midline and mobile nontender   Adnexa: Without masses or tenderness    Anus and perineum: Normal.  Old external hemorrhoids noted.  Rectovaginal: Normal sphincter tone without palpated masses or tenderness.    Assessment/Plan:  57 y.o. G48P1001 female for annual gynecologic exam.   1. Postmenopausal.  No significant menopausal symptoms or any vaginal bleeding. 2. Nocturia.  Patient notes over the past year getting up more at night to void.  No frequency dysuria urgency low back pain fever or chills.  Reviewed not unusual as we get older.  Will check baseline urine analysis to rule out subclinical infection. 3. Pap smear/HPV 2014.  Pap smear/HPV today.  No history of abnormal Pap smears  previously. 4. Mammography 11/2017.  Continue with annual mammography when due.  Breast exam normal today. 5. Colonoscopy 2015.  Repeat at their recommended interval. 6. DEXA never.  Will plan further into the menopause at age 42. 62. Health maintenance.  No routine lab work done as patient does this elsewhere.  Follow-up 1 year, sooner as needed.   Anastasio Auerbach MD, 2:27 PM 06/03/2018

## 2018-06-03 NOTE — Addendum Note (Signed)
Addended by: Nelva Nay on: 06/03/2018 02:58 PM   Modules accepted: Orders

## 2018-06-04 LAB — URINALYSIS, COMPLETE W/RFL CULTURE
Bacteria, UA: NONE SEEN /HPF
Bilirubin Urine: NEGATIVE
Glucose, UA: NEGATIVE
Hgb urine dipstick: NEGATIVE
Hyaline Cast: NONE SEEN /LPF
Ketones, ur: NEGATIVE
Leukocyte Esterase: NEGATIVE
Nitrites, Initial: NEGATIVE
Protein, ur: NEGATIVE
RBC / HPF: NONE SEEN /HPF (ref 0–2)
Specific Gravity, Urine: 1.007 (ref 1.001–1.03)
Squamous Epithelial / LPF: NONE SEEN /HPF (ref <=5)
WBC, UA: NONE SEEN /HPF (ref 0–5)
pH: 5.5 (ref 5.0–8.0)

## 2018-06-04 LAB — NO CULTURE INDICATED

## 2018-06-07 LAB — PAP IG AND HPV HIGH-RISK: HPV DNA High Risk: NOT DETECTED

## 2018-07-01 DIAGNOSIS — N6325 Unspecified lump in the left breast, overlapping quadrants: Secondary | ICD-10-CM | POA: Diagnosis not present

## 2018-07-01 DIAGNOSIS — Z803 Family history of malignant neoplasm of breast: Secondary | ICD-10-CM | POA: Diagnosis not present

## 2018-07-01 DIAGNOSIS — N6322 Unspecified lump in the left breast, upper inner quadrant: Secondary | ICD-10-CM | POA: Diagnosis not present

## 2018-07-04 ENCOUNTER — Encounter: Payer: Self-pay | Admitting: Gynecology

## 2018-08-19 DIAGNOSIS — D1801 Hemangioma of skin and subcutaneous tissue: Secondary | ICD-10-CM | POA: Diagnosis not present

## 2018-08-19 DIAGNOSIS — L821 Other seborrheic keratosis: Secondary | ICD-10-CM | POA: Diagnosis not present

## 2018-08-19 DIAGNOSIS — L738 Other specified follicular disorders: Secondary | ICD-10-CM | POA: Diagnosis not present

## 2018-08-19 DIAGNOSIS — L218 Other seborrheic dermatitis: Secondary | ICD-10-CM | POA: Diagnosis not present

## 2018-10-27 DIAGNOSIS — M545 Low back pain: Secondary | ICD-10-CM | POA: Diagnosis not present

## 2018-11-04 ENCOUNTER — Telehealth: Payer: Self-pay | Admitting: Internal Medicine

## 2018-11-04 MED ORDER — HYDROCHLOROTHIAZIDE 25 MG PO TABS: 25.0000 mg | ORAL_TABLET | Freq: Every day | ORAL | 1 refills | Status: DC

## 2018-11-04 MED ORDER — VERAPAMIL HCL ER 180 MG PO TBCR: 180.0000 mg | EXTENDED_RELEASE_TABLET | Freq: Every day | ORAL | 1 refills | Status: DC

## 2018-11-04 NOTE — Telephone Encounter (Signed)
Patient needs Pharmacy changed in her chart to [facility]--Fax: [phone]/PWXGK: [phone]  Patient needs HCTZ and Verapamil sent to this pharmacy.  Patient is almost out of Verapamil.

## 2018-11-04 NOTE — Telephone Encounter (Signed)
Pharmacy has been changed and prescriptions have been sent.

## 2018-11-22 ENCOUNTER — Other Ambulatory Visit: Payer: Self-pay

## 2018-11-22 ENCOUNTER — Encounter: Payer: Self-pay | Admitting: Gynecology

## 2018-11-22 ENCOUNTER — Ambulatory Visit: Payer: BC Managed Care – PPO | Admitting: Gynecology

## 2018-11-22 VITALS — BP 122/80

## 2018-11-22 DIAGNOSIS — N3001 Acute cystitis with hematuria: Secondary | ICD-10-CM | POA: Diagnosis not present

## 2018-11-22 MED ORDER — CIPROFLOXACIN HCL 250 MG PO TABS: ORAL_TABLET | ORAL | 0 refills | Status: DC

## 2018-11-22 MED ORDER — NYSTATIN 100000 UNIT/GM EX POWD: Freq: Four times a day (QID) | CUTANEOUS | 2 refills | Status: DC

## 2018-11-22 NOTE — Patient Instructions (Signed)
Take 2 antibiotic pills twice daily for the first 2 days then 1 pill twice daily for the remaining 5 days to treat the urinary tract infection.

## 2018-11-22 NOTE — Progress Notes (Signed)
    COLLIE KITTEL February 15, 1961 [id number]        58 y.o.  G1P1001 presents with 2 days of worsening frequency dysuria and urgency.  Now with some low back pain.  No fever or chills.  No nausea vomiting.  No vaginal symptoms such as discharge odor or irritation.  Notes frank blood in her urine today.  Past medical history,surgical history, problem list, medications, allergies, family history and social history were all reviewed and documented in the EPIC chart.  Directed ROS with pertinent positives and negatives documented in the history of present illness/assessment and plan.  Exam: Vitals:   11/22/18 1247  BP: 122/80   General appearance:  Normal Spine straight with mild CVA tenderness Abdomen with mild suprapubic tenderness.  No masses guarding rebound  Assessment/Plan:  58 y.o. G1P1001 with hemorrhagic cystitis.  Will treat with ciprofloxacin 500 mg twice daily x2 days then 250 mg twice daily x5 days for a 1 week course.  Follow-up if symptoms persist, worsen or recur.  She asked for a refill on nystatin powder which she received through another physician several years ago but this is expired and she is developing a rash in her panniculus as the weather is getting warmer.  60 g refill provided with 2 additional refills.    Anastasio Auerbach MD, 1:05 PM 11/22/2018

## 2018-11-22 NOTE — Addendum Note (Signed)
Addended by: Nelva Nay on: 11/22/2018 01:50 PM   Modules accepted: Orders

## 2018-11-24 LAB — URINALYSIS, COMPLETE W/RFL CULTURE
Bilirubin Urine: NEGATIVE
Glucose, UA: NEGATIVE
Hyaline Cast: NONE SEEN /LPF
Ketones, ur: NEGATIVE
Nitrites, Initial: NEGATIVE
Specific Gravity, Urine: 1.011 (ref 1.001–1.03)
pH: 6 (ref 5.0–8.0)

## 2018-11-24 LAB — URINE CULTURE
MICRO NUMBER:: 532606
SPECIMEN QUALITY:: ADEQUATE

## 2018-11-24 LAB — CULTURE INDICATED

## 2018-12-01 DIAGNOSIS — L308 Other specified dermatitis: Secondary | ICD-10-CM | POA: Diagnosis not present

## 2018-12-16 ENCOUNTER — Encounter: Payer: Self-pay | Admitting: Gynecology

## 2018-12-16 DIAGNOSIS — Z803 Family history of malignant neoplasm of breast: Secondary | ICD-10-CM | POA: Diagnosis not present

## 2018-12-16 DIAGNOSIS — Z1231 Encounter for screening mammogram for malignant neoplasm of breast: Secondary | ICD-10-CM | POA: Diagnosis not present

## 2019-03-21 ENCOUNTER — Encounter: Payer: Self-pay | Admitting: Gynecology

## 2019-04-05 ENCOUNTER — Encounter: Payer: Self-pay | Admitting: General Practice

## 2019-04-14 ENCOUNTER — Other Ambulatory Visit: Payer: Self-pay | Admitting: Internal Medicine

## 2019-04-14 NOTE — Telephone Encounter (Signed)
Mfessage forwarded to PCP as the last Rx for Zomig given in 2018 and I do not see Sulindac on the pts current med list.

## 2019-04-14 NOTE — Telephone Encounter (Signed)
Medication Refill - Medication: ZOMIG 5 MG nasal solution, sulindac (CLINORIL) 200 MG tablet   Preferred Pharmacy:  [id number] - [place], [address] [place] [address]  Phone: [phone] Fax: [phone]    Pt was  advised that RX refills may take up to 3 business days. We ask that you follow-up with your pharmacy.

## 2019-04-17 MED ORDER — ZOMIG 5 MG NA SOLN: NASAL | 0 refills | Status: DC

## 2019-04-17 NOTE — Telephone Encounter (Signed)
Tried to contact pt, voicemail box is full. Rx for Zomig was sent to pharmacy. Will have to discuss other medication at visit per Dr. Regis Bill.

## 2019-04-17 NOTE — Telephone Encounter (Signed)
Ok to refill the zomig  X 2      Butsiince  I haven't  Ordered the sulindac  Would need visit to order  Or  discuss at her next cpx

## 2019-04-25 ENCOUNTER — Encounter: Payer: Self-pay | Admitting: Gynecology

## 2019-04-25 MED ORDER — ZOMIG 5 MG NA SOLN: NASAL | 0 refills | Status: DC

## 2019-04-28 ENCOUNTER — Telehealth: Payer: Self-pay | Admitting: Internal Medicine

## 2019-04-28 NOTE — Telephone Encounter (Signed)
Medication:  sulindac (CLINORIL) 200 MG tablet [id number]  DISCONTINUED Patient states that Dr. Regis Bill received all [facility]  Dr. Regis Bill is not the original prescriber.  Has the patient contacted their pharmacy? Yes  (Agent: If no, request that the patient contact the pharmacy for the refill.) (Agent: If yes, when and what did the pharmacy advise?)  Preferred Pharmacy (with phone number or street name): [id number] [address], [place]. [phone] (Phone) [phone] (Fax)    Agent: Please be advised that RX refills may take up to 3 business days. We ask that you follow-up with your pharmacy.

## 2019-05-01 NOTE — Telephone Encounter (Signed)
This med is not on her active list    And I dont see   The [facility] records in the system   So before I rx wi need more information about how often using  And dosing   We can take over prescribing  this med as  Appropriate but need OV or VV  to discuss    If she  Has an upcoming visit can discuss then or  VV if needed pre visit .

## 2019-05-01 NOTE — Telephone Encounter (Signed)
Pt states that she will wait until her physical told pt if she needs it sooner to call us to fit in for virtual

## 2019-05-03 ENCOUNTER — Telehealth: Payer: Self-pay | Admitting: Internal Medicine

## 2019-05-03 NOTE — Telephone Encounter (Signed)
zolmitriptan (ZOMIG) 5 MG nasal solution [id number]  Pt states that she needs this medication as soon as possible, had a bad migraine yesterday. Pt states that she spoke to insurance and they require authorization.    [id number] [address], [place] [address]  PhoneSE:[phone] Fax: [phone]

## 2019-05-05 NOTE — Telephone Encounter (Signed)
PA has been started

## 2019-05-08 NOTE — Telephone Encounter (Signed)
PA denied PT has been informed asked what else she could do told pt she can call and ask what they will cover

## 2019-05-09 DIAGNOSIS — S335XXA Sprain of ligaments of lumbar spine, initial encounter: Secondary | ICD-10-CM | POA: Diagnosis not present

## 2019-05-17 NOTE — Progress Notes (Signed)
Chief Complaint  Patient presents with   Annual Exam    Pt wants to talk about migraine medication   This visit occurred during the SARS-CoV-2 public health emergency.  Safety protocols were in place, including screening questions prior to the visit, additional usage of staff PPE, and extensive cleaning of exam room while observing appropriate contact time as indicated for disinfecting solutions.     HPI: Patient  Laura Smith  58 y.o. comes in today for [place] visit and med check  Other concerns    Back went out before t giving  Seeing dr Lytle Michaels.  Stayed in bed.  Heating pad.    Migraines not as often  But asks for sulindac and zo,mig as in past   Many other meds not helpful   Dr Catalina Gravel  Records had been sent .   Apparent insurance denied  the nasal zomig   ?  Step therapy ?    Had narcotic meds rescue in past but rare     Stress  Was better when furloughed but when back to work stress   Monday am vomiting gagging in shower  onluy on days back to work   Not on vacation or other mondays  Thinks stress  Coworker was on effexor and asks about using this   No longer in therapy previous  Retired for health reasons  Wants to work for a nother 2 years until retirement  25 years  .  Doesn't feel bad other times  Just after weekend going back to work . May have tried prozac in remote past  And cause worsening? Of some sx ?   bp needs refill no se noted  Of meds     Health Maintenance  Topic Date Due   Hepatitis C Screening  1960/08/19   INFLUENZA VACCINE  01/21/2019   COLONOSCOPY  04/28/2019   MAMMOGRAM  12/15/2020   PAP SMEAR-Modifier  06/03/2021   TETANUS/TDAP  04/29/2026   HIV Screening  Completed   Health Maintenance Review LIFESTYLE:  Exercise:   Not much  Tobacco/ETS:  no Alcohol:   no Sugar beverages: on weekends . Sleep: about 7  When not  Drug use: no HH of  2  Work:  40  Quality work   ROS: see above  GEN/ HEENT: No fever, significant  weight changes sweats headaches vision problems hearing changes, CV/ PULM; No chest pain shortness of breath cough, syncope,edema  change in exercise tolerance. GI /GU: No adominal pain, vomiting, change in bowel habits. No blood in the stool. No significant GU symptoms. SKIN/HEME: ,no acute skin rashes suspicious lesions or bleeding. No lymphadenopathy, nodules, masses.  NEURO/ PSYCH:  No neurologic signs such as weakness numbness. No depression anxiety. IMM/ Allergy: No unusual infections.  Allergy .   REST of 12 system review negative except as per HPI   Past Medical History:  Diagnosis Date   Allergy    Breast cyst    Endometriosis    surgery laser 2001   Hemorrhoids    Hx of basal cell carcinoma    Hypertension    Migraine    Urinary tract infection     Past Surgical History:  Procedure Laterality Date   APPENDECTOMY  1992   AT C/S   CESAREAN SECTION  1992   APPENDECTOMY SAME TIME   HYSTEROSCOPY  08/1999   AT TIME OF LAPAROSCOPY   PELVIC LAPAROSCOPY  08/1999   AND HYSTEROSCOPY--ENDOMETRIOSIS    Family History  Problem Relation Age of Onset   Breast cancer Mother        Age 38   Colon cancer Mother 61   Breast cancer Cousin        Age 47's   Heart disease Father    Hypertension Paternal Grandmother    Heart disease Paternal Grandmother    Stroke Paternal Grandmother    Cancer Paternal Grandmother        Lung   Cancer Paternal Grandfather        Prostate   Stroke Maternal Grandfather     Social History   Socioeconomic History   Marital status: Married    Spouse name: Not on file   Number of children: Not on file   Years of education: Not on file   Highest education level: Not on file  Occupational History   Not on file  Social Needs   Financial resource strain: Not on file   Food insecurity    Worry: Not on file    Inability: Not on file   Transportation needs    Medical: Not on file    Non-medical: Not on file    Tobacco Use   Smoking status: Never Smoker   Smokeless tobacco: Never Used  Substance and Sexual Activity   Alcohol use: No    Alcohol/week: 0.0 standard drinks   Drug use: No   Sexual activity: Yes    Birth control/protection: Post-menopausal    Comment: Pt declined sexual hx questions  Lifestyle   Physical activity    Days per week: Not on file    Minutes per session: Not on file   Stress: Not on file  Relationships   Social connections    Talks on phone: Not on file    Gets together: Not on file    Attends religious service: Not on file    Active member of club or organization: Not on file    Attends meetings of clubs or organizations: Not on file    Relationship status: Not on file  Other Topics Concern   Not on file  Social History Narrative   h h of 2 Pet cat   Daughter /  Married    g1 p1   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      b complex vitamins tablet Take 2 tablets by mouth daily.      CALCIUM-MAGNESIUM PO Take 3 tablets by mouth daily.      cetirizine (ZYRTEC) 10 MG tablet Take 10 mg by mouth daily.     cyclobenzaprine (FLEXERIL) 10 MG tablet Take 10 mg by mouth as needed for muscle spasms.      HYDROcodone-acetaminophen (NORCO) 7.5-325 MG per tablet Take 1-2 tablets by mouth every 4 (four) hours as needed for pain (as needed for migraine). Reported on 09/23/2015     HYDROmorphone (DILAUDID) 4 MG tablet Take 4-8 mg by mouth every 4 (four) hours as needed for pain (rescure for migraine). Reported on 09/23/2015     Multiple Vitamins-Iron (MULTIVITAMIN/IRON) TABS Take 1 tablet by mouth daily.      nystatin (NYSTATIN) powder Apply topically 4 (four) times daily. As needed for rash 30 g 2   SUMAtriptan (IMITREX) 6 MG/0.5ML SOLN injection Inject 6 mg into the skin every 2 (two) hours  as  needed for migraine. Reported on 09/23/2015     traMADol (ULTRAM) 50 MG tablet Take 50 mg by mouth as needed for severe pain.      zolmitriptan (ZOMIG) 5 MG nasal solution PLACE 1 SPRAY INTO THE NOSE DAILY AS NEEDED (FOR MIGRAINES). REPORTED ON 09/23/2015 6 each 0   hydrochlorothiazide (HYDRODIURIL) 25 MG tablet Take 1 tablet (25 mg total) by mouth daily. 90 tablet 1   montelukast (SINGULAIR) 10 MG tablet Take 1 tablet (10 mg total) by mouth daily as needed (for seasonal allergies, uses as needed). 90 tablet 0   verapamil (CALAN-SR) 180 MG CR tablet Take 1 tablet (180 mg total) by mouth at bedtime. 90 tablet 1   meclizine (ANTIVERT) 25 MG tablet TAKE 2 TABLET (50 MG TOTAL) BY MOUTH 2 (TWO) TIMES DAILY AS NEEDED (FOR TRAVEL). (Patient not taking: Reported on 11/22/2018) 30 tablet 0   ciprofloxacin (CIPRO) 250 MG tablet Take 2 pills twice daily for 2 days then one pill twice daily for 5 days (Patient not taking: Reported on 05/22/2019) 18 tablet 0   Facility-Administered Medications Prior to Visit  Medication Dose Route Frequency Provider Last Rate Last Dose   methylPREDNISolone acetate (DEPO-MEDROL) injection 120 mg  120 mg Intramuscular Once Kamori Kitchens, [name], MD         EXAM:  BP 120/64 (BP Location: Right Arm, Patient Position: Sitting, Cuff Size: Normal)    Pulse 86    Temp 97.6 F (36.4 C) (Temporal)    Ht 5' 7"  (1.702 m)    Wt 210 lb (95.3 kg)    SpO2 98%    BMI 32.89 kg/m   Body mass index is 32.89 kg/m. Wt Readings from Last 3 Encounters:  05/22/19 210 lb (95.3 kg)  06/03/18 215 lb (97.5 kg)  05/17/18 209 lb 1.6 oz (94.8 kg)    Physical Exam: Vital signs reviewed WLS:LHTD is a well-developed well-nourished alert cooperative    who appearsr stated age in no acute distress.  HEENT: normocephalic atraumatic , Eyes: PERRL EOM's full, conjunctiva clear, Nares: paten,t no deformity discharge or tenderness., Ears: no deformity EAC's clear TMs with normal landmarks. Mouth:masked  . NECK: supple without masses, thyromegaly or bruits. CHEST/PULM:  Clear to auscultation and percussion breath sounds equal no wheeze , rales or rhonchi. No chest wall deformities or tenderness. Breast: normal by inspection . No dimpling, discharge, masses, tenderness or discharge . CV: PMI is nondisplaced, S1 S2 no gallops, murmurs, rubs. Peripheral pulses are full without delay.No JVD .  ABDOMEN: Bowel sounds normal nontender  No guard or rebound, no hepato splenomegal no CVA tenderness.  Back botering her   Uncomfortable laying flat  Extremtities:  No clubbing cyanosis or edema, no acute joint swelling or redness no focal atrophy NEURO:  Oriented x3, cranial nerves 3-12 appear to be intact, no obvious focal weakness,gait within normal limits no abnormal reflexes or asymmetrical SKIN: No acute rashes normal turgor, color, no bruising or petechiae. PSYCH: Oriented, good eye contact, no obvious depression anxiety, cognition and judgment appear normal.  Mild  Anxiety  LN: no cervical axillaryadenopathy  Lab Results  Component Value Date   WBC 8.2 05/17/2018   HGB 15.0 05/17/2018   HCT 44.5 05/17/2018   PLT 341.0 05/17/2018   GLUCOSE 91 05/17/2018   CHOL 193 05/17/2018   TRIG 138.0 05/17/2018   HDL 56.00 05/17/2018   LDLDIRECT 133.8 04/07/2013   LDLCALC 109 (H) 05/17/2018   ALT 18 05/17/2018   AST 19  05/17/2018   NA 139 05/17/2018   K 3.8 05/17/2018   CL 100 05/17/2018   CREATININE 0.84 05/17/2018   BUN 13 05/17/2018   CO2 29 05/17/2018   TSH 2.82 05/17/2018   HGBA1C 5.8 05/17/2018    BP Readings from Last 3 Encounters:  05/22/19 120/64  11/22/18 122/80  06/03/18 124/80    Lab plan  reviewed with patient   ASSESSMENT AND PLAN:  Discussed the following assessment and plan:    ICD-10-CM   1. Visit for preventive health examination  S81.10 Basic metabolic panel    CBC with Differential    Hepatic function panel    Lipid panel    TSH    Hemoglobin A1c  2. Medication  management  R15.945 Basic metabolic panel    CBC with Differential    Hepatic function panel    Lipid panel    TSH    Hemoglobin A1c  3. Other migraine without status migrainosus, not intractable  O59.292 Basic metabolic panel    CBC with Differential    Hepatic function panel    Lipid panel    TSH  4. Stress  F43.9   5. Essential hypertension  K46 Basic metabolic panel    CBC with Differential    Hepatic function panel    Lipid panel    TSH    Hemoglobin A1c  6. Fasting hyperglycemia  K86.38 Basic metabolic panel    CBC with Differential    Hepatic function panel    Lipid panel    TSH    Hemoglobin A1c  7. Need for immunization against influenza  Z23 Flu Vaccine QUAD 36+ mos IM   Lab due  Trial of  ODT zomig   To see if woirks  Disc  about stress and work anc counseling would probably help a good bit as seems situational but sent in effexoir   Risk benefit of medication discussed.   She may chose not to begin this  But if so then rov VV ok in 3-4 weeks  Disc work  And sitting  With back pain  Physical modal;ityies may be the best  To recover  To ger her updated colon  Patient Care Team: Niang Mitcheltree, [name], MD as PCP - General (Internal Medicine) Phineas Real, Belinda Block, MD as Attending Physician (Obstetrics and Gynecology) Jarome Matin, MD as Consulting Physician (Dermatology) Thalia Bloodgood, OD as Referring Physician (Optometry) Patient Instructions  Sent in sulindac for as needed migraine   It may be that  They tell you have to try  And fail other meds first  For the  somig nasal  Be we can try the oral and see if that is approved .  Sending in effexor   doseing  Can help with anxietty depression and headaches in some people  Specific counseling may help.   Plan rov virtual visit in 3-4 weeks or as needed   Will notify you  of labs when available.  Health Maintenance, Female Adopting a healthy lifestyle and getting preventive care are important in promoting health and  wellness. Ask your health care provider about:  The right schedule for you to have regular tests and exams.  Things you can do on your own to prevent diseases and keep yourself healthy. What should I know about diet, weight, and exercise? Eat a healthy diet   Eat a diet that includes plenty of vegetables, fruits, low-fat dairy products, and lean protein.  Do not eat a lot of foods that  are high in solid fats, added sugars, or sodium. Maintain a healthy weight Body mass index (BMI) is used to identify weight problems. It estimates body fat based on height and weight. Your health care provider can help determine your BMI and help you achieve or maintain a healthy weight. Get regular exercise Get regular exercise. This is one of the most important things you can do for your health. Most adults should:  Exercise for at least 150 minutes each week. The exercise should increase your heart rate and make you sweat (moderate-intensity exercise).  Do strengthening exercises at least twice a week. This is in addition to the moderate-intensity exercise.  Spend less time sitting. Even light physical activity can be beneficial. Watch cholesterol and blood lipids Have your blood tested for lipids and cholesterol at 59 years of age, then have this test every 5 years. Have your cholesterol levels checked more often if:  Your lipid or cholesterol levels are high.  You are older than 59 years of age.  You are at high risk for heart disease. What should I know about cancer screening? Depending on your health history and family history, you may need to have cancer screening at various ages. This may include screening for:  Breast cancer.  Cervical cancer.  Colorectal cancer.  Skin cancer.  Lung cancer. What should I know about heart disease, diabetes, and high blood pressure? Blood pressure and heart disease  High blood pressure causes heart disease and increases the risk of stroke. This is  more likely to develop in people who have high blood pressure readings, are of African descent, or are overweight.  Have your blood pressure checked: ? Every 3-5 years if you are 62-70 years of age. ? Every year if you are 68 years old or older. Diabetes Have regular diabetes screenings. This checks your fasting blood sugar level. Have the screening done:  Once every three years after age 41 if you are at a normal weight and have a low risk for diabetes.  More often and at a younger age if you are overweight or have a high risk for diabetes. What should I know about preventing infection? Hepatitis B If you have a higher risk for hepatitis B, you should be screened for this virus. Talk with your health care provider to find out if you are at risk for hepatitis B infection. Hepatitis C Testing is recommended for:  Everyone born from [birth year] through 1966.  Anyone with known risk factors for hepatitis C. Sexually transmitted infections (STIs)  Get screened for STIs, including gonorrhea and chlamydia, if: ? You are sexually active and are younger than 59 years of age. ? You are older than 59 years of age and your health care provider tells you that you are at risk for this type of infection. ? Your sexual activity has changed since you were last screened, and you are at increased risk for chlamydia or gonorrhea. Ask your health care provider if you are at risk.  Ask your health care provider about whether you are at high risk for HIV. Your health care provider may recommend a prescription medicine to help prevent HIV infection. If you choose to take medicine to prevent HIV, you should first get tested for HIV. You should then be tested every 3 months for as long as you are taking the medicine. Pregnancy  If you are about to stop having your period (premenopausal) and you may become pregnant, seek counseling before you get pregnant.  Take 400 to 800 micrograms (mcg) of folic acid every day if  you become pregnant.  Ask for birth control (contraception) if you want to prevent pregnancy. Osteoporosis and menopause Osteoporosis is a disease in which the bones lose minerals and strength with aging. This can result in bone fractures. If you are 54 years old or older, or if you are at risk for osteoporosis and fractures, ask your health care provider if you should:  Be screened for bone loss.  Take a calcium or vitamin D supplement to lower your risk of fractures.  Be given hormone replacement therapy (HRT) to treat symptoms of menopause. Follow these instructions at home: Lifestyle  Do not use any products that contain nicotine or tobacco, such as cigarettes, e-cigarettes, and chewing tobacco. If you need help quitting, ask your health care provider.  Do not use street drugs.  Do not share needles.  Ask your health care provider for help if you need support or information about quitting drugs. Alcohol use  Do not drink alcohol if: ? Your health care provider tells you not to drink. ? You are pregnant, may be pregnant, or are planning to become pregnant.  If you drink alcohol: ? Limit how much you use to 0-1 drink a day. ? Limit intake if you are breastfeeding.  Be aware of how much alcohol is in your drink. In the [place] one drink equals one 12 oz bottle of beer (355 mL), one 5 oz glass of wine (148 mL), or one 1 oz glass of hard liquor (44 mL). General instructions  Schedule regular health, dental, and eye exams.  Stay current with your vaccines.  Tell your health care provider if: ? You often feel depressed. ? You have ever been abused or do not feel safe at home. Summary  Adopting a healthy lifestyle and getting preventive care are important in promoting health and wellness.  Follow your health care provider's instructions about healthy diet, exercising, and getting tested or screened for diseases.  Follow your health care provider's instructions on monitoring  your cholesterol and blood pressure. This information is not intended to replace advice given to you by your health care provider. Make sure you discuss any questions you have with your health care provider. Document Released: 12/22/2010 Document Revised: 06/01/2018 Document Reviewed: 06/01/2018 Elsevier Patient Education  2020 [place] is a normal reaction to life events. Stress is what you feel when life demands more than you are used to, or more than you think you can handle. Some stress can be useful, such as studying for a test or meeting a deadline at work. Stress that occurs too often or for too long can cause problems. It can affect your emotional health and interfere with relationships and normal daily activities. Too much stress can weaken your body's defense system (immune system) and increase your risk for physical illness. If you already have a medical problem, stress can make it worse. What are the causes? All sorts of life events can cause stress. An event that causes stress for one person may not be stressful for another person. Major life events, whether positive or negative, commonly cause stress. Examples include:  Losing a job or starting a new job.  Losing a loved one.  Moving to a new town or home.  Getting married or divorced.  Having a baby.  Injury or illness. Less obvious life events can also cause stress, especially if they occur day after day or  in combination with each other. Examples include:  Working long hours.  Driving in traffic.  Caring for children.  Being in debt.  Being in a difficult relationship. What are the signs or symptoms? Stress can cause emotional symptoms, including:  Anxiety. This is feeling worried, afraid, on edge, overwhelmed, or out of control.  Anger, including irritation or impatience.  Depression. This is feeling sad, down, helpless, or guilty.  Trouble focusing, remembering, or making  decisions. Stress can cause physical symptoms, including:  Aches and pains. These may affect your head, neck, back, stomach, or other areas of your body.  Tight muscles or a clenched jaw.  Low energy.  Trouble sleeping. Stress can cause unhealthy behaviors, including:  Eating to feel better (overeating) or skipping meals.  Working too much or putting off tasks.  Smoking, drinking alcohol, or using drugs to feel better. How is this diagnosed? Stress is diagnosed through an assessment by your health care provider. He or she may diagnose this condition based on:  Your symptoms and any stressful life events.  Your medical history.  Tests to rule out other causes of your symptoms. Depending on your condition, your health care provider may refer you to a specialist for further evaluation. How is this treated?  Stress management techniques are the recommended treatment for stress. Medicine is not typically recommended for the treatment of stress. Techniques to reduce your reaction to stressful life events include:  Stress identification. Monitor yourself for symptoms of stress and identify what causes stress for you. These skills may help you to avoid or prepare for stressful events.  Time management. Set your priorities, keep a calendar of events, and learn to say no. Taking these actions can help you avoid making too many commitments. Techniques for coping with stress include:  Rethinking the problem. Try to think realistically about stressful events rather than ignoring them or overreacting. Try to find the positives in a stressful situation rather than focusing on the negatives.  Exercise. Physical exercise can release both physical and emotional tension. The key is to find a form of exercise that you enjoy and do it regularly.  Relaxation techniques. These relax the body and mind. The key is to find one or more that you enjoy and use the technique(s) regularly. Examples  include: ? Meditation, deep breathing, or progressive relaxation techniques. ? Yoga or tai chi. ? Biofeedback, mindfulness techniques, or journaling. ? Listening to music, being out in nature, or participating in other hobbies.  Practicing a healthy lifestyle. Eat a balanced diet, drink plenty of water, limit or avoid caffeine, and get plenty of sleep.  Having a strong support network. Spend time with family, friends, or other people you enjoy being around. Express your feelings and talk things over with someone you trust. Counseling or talk therapy with a mental health professional may be helpful if you are having trouble managing stress on your own. Follow these instructions at home: Lifestyle   Avoid drugs.  Do not use any products that contain nicotine or tobacco, such as cigarettes and e-cigarettes. If you need help quitting, ask your health care provider.  Limit alcohol intake to no more than 1 drink a day for nonpregnant women and 2 drinks a day for men. One drink equals 12 oz of beer, 5 oz of wine, or 1 oz of hard liquor.  Do not use alcohol or drugs to relax.  Eat a balanced diet that includes fresh fruits and vegetables, whole grains, lean meats, fish, eggs,  and beans, and low-fat dairy. Avoid processed foods and foods high in added fat, sugar, and salt.  Exercise at least 30 minutes on 5 or more days each week.  Get 7-8 hours of sleep each night. General instructions   Practice stress management techniques as discussed with your health care provider.  Drink enough fluid to keep your urine clear or pale yellow.  Take over-the-counter and prescription medicines only as told by your health care provider.  Keep all follow-up visits as told by your health care provider. This is important. Contact a health care provider if:  Your symptoms get worse.  You have new symptoms.  You feel overwhelmed by your problems and can no longer manage them on your own. Get help  right away if:  You have thoughts of hurting yourself or others. If you ever feel like you may hurt yourself or others, or have thoughts about taking your own life, get help right away. You can go to your nearest emergency department or call:  Your local emergency services (911 in the [place]  A suicide crisis helpline, such as the [facility] at 616-295-0422. This is open 24 hours a day. Summary  Stress is a normal reaction to life events. It can cause problems if it happens too often or for too long.  Practicing stress management techniques is the best way to treat stress.  Counseling or talk therapy with a mental health professional may be helpful if you are having trouble managing stress on your own. This information is not intended to replace advice given to you by your health care provider. Make sure you discuss any questions you have with your health care provider. Document Released: 12/02/2000 Document Revised: 05/21/2017 Document Reviewed: 07/29/2016 Elsevier Patient Education  2020 [place] Paul M.D.

## 2019-05-22 ENCOUNTER — Ambulatory Visit (INDEPENDENT_AMBULATORY_CARE_PROVIDER_SITE_OTHER): Payer: BLUE CROSS/BLUE SHIELD | Admitting: Internal Medicine

## 2019-05-22 ENCOUNTER — Encounter: Payer: Self-pay | Admitting: Internal Medicine

## 2019-05-22 ENCOUNTER — Other Ambulatory Visit: Payer: Self-pay

## 2019-05-22 VITALS — BP 120/64 | HR 86 | Temp 97.6°F | Ht 67.0 in | Wt 210.0 lb

## 2019-05-22 DIAGNOSIS — Z0000 Encounter for general adult medical examination without abnormal findings: Secondary | ICD-10-CM | POA: Diagnosis not present

## 2019-05-22 DIAGNOSIS — G43809 Other migraine, not intractable, without status migrainosus: Secondary | ICD-10-CM

## 2019-05-22 DIAGNOSIS — Z79899 Other long term (current) drug therapy: Secondary | ICD-10-CM | POA: Diagnosis not present

## 2019-05-22 DIAGNOSIS — R7301 Impaired fasting glucose: Secondary | ICD-10-CM | POA: Diagnosis not present

## 2019-05-22 DIAGNOSIS — I10 Essential (primary) hypertension: Secondary | ICD-10-CM

## 2019-05-22 DIAGNOSIS — Z23 Encounter for immunization: Secondary | ICD-10-CM | POA: Diagnosis not present

## 2019-05-22 DIAGNOSIS — F439 Reaction to severe stress, unspecified: Secondary | ICD-10-CM | POA: Diagnosis not present

## 2019-05-22 LAB — LIPID PANEL
Cholesterol: 197 mg/dL (ref 0–200)
HDL: 61.1 mg/dL (ref >39.00)
LDL Cholesterol: 117 mg/dL — ABNORMAL HIGH (ref 0–99)
NonHDL: 135.91
Total CHOL/HDL Ratio: 3
Triglycerides: 95 mg/dL (ref 0.0–149.0)
VLDL: 19 mg/dL (ref 0.0–40.0)

## 2019-05-22 LAB — CBC WITH DIFFERENTIAL/PLATELET
Basophils Absolute: 0.1 10*3/uL (ref 0.0–0.1)
Basophils Relative: 0.5 % (ref 0.0–3.0)
Eosinophils Absolute: 0.1 10*3/uL (ref 0.0–0.7)
Eosinophils Relative: 0.5 % (ref 0.0–5.0)
HCT: 47.8 % — ABNORMAL HIGH (ref 36.0–46.0)
Hemoglobin: 15.8 g/dL — ABNORMAL HIGH (ref 12.0–15.0)
Lymphocytes Relative: 20.9 % (ref 12.0–46.0)
Lymphs Abs: 2.2 10*3/uL (ref 0.7–4.0)
MCHC: 33 g/dL (ref 30.0–36.0)
MCV: 87.7 fl (ref 78.0–100.0)
Monocytes Absolute: 0.6 10*3/uL (ref 0.1–1.0)
Monocytes Relative: 5.6 % (ref 3.0–12.0)
Neutro Abs: 7.5 10*3/uL (ref 1.4–7.7)
Neutrophils Relative %: 72.5 % (ref 43.0–77.0)
Platelets: 367 10*3/uL (ref 150.0–400.0)
RBC: 5.44 Mil/uL — ABNORMAL HIGH (ref 3.87–5.11)
RDW: 13.8 % (ref 11.5–15.5)
WBC: 10.4 10*3/uL (ref 4.0–10.5)

## 2019-05-22 LAB — BASIC METABOLIC PANEL
BUN: 19 mg/dL (ref 6–23)
CO2: 30 mEq/L (ref 19–32)
Calcium: 9.6 mg/dL (ref 8.4–10.5)
Chloride: 99 mEq/L (ref 96–112)
Creatinine, Ser: 0.88 mg/dL (ref 0.40–1.20)
GFR: 65.7 mL/min (ref >60.00)
Glucose, Bld: 97 mg/dL (ref 70–99)
Potassium: 4.2 mEq/L (ref 3.5–5.1)
Sodium: 138 mEq/L (ref 135–145)

## 2019-05-22 LAB — TSH: TSH: 3.67 u[IU]/mL (ref 0.35–4.50)

## 2019-05-22 LAB — HEPATIC FUNCTION PANEL
ALT: 13 U/L (ref 0–35)
AST: 14 U/L (ref 0–37)
Albumin: 4.2 g/dL (ref 3.5–5.2)
Alkaline Phosphatase: 63 U/L (ref 39–117)
Bilirubin, Direct: 0.1 mg/dL (ref 0.0–0.3)
Total Bilirubin: 0.5 mg/dL (ref 0.2–1.2)
Total Protein: 7.1 g/dL (ref 6.0–8.3)

## 2019-05-22 LAB — HEMOGLOBIN A1C: Hgb A1c MFr Bld: 5.8 % (ref 4.6–6.5)

## 2019-05-22 MED ORDER — ZOLMITRIPTAN 5 MG PO TBDP: 5.0000 mg | ORAL_TABLET | ORAL | 2 refills | Status: DC | PRN

## 2019-05-22 MED ORDER — SULINDAC 200 MG PO TABS: 200.0000 mg | ORAL_TABLET | Freq: Two times a day (BID) | ORAL | 3 refills | Status: DC | PRN

## 2019-05-22 MED ORDER — VENLAFAXINE HCL ER 37.5 MG PO CP24: 37.5000 mg | ORAL_CAPSULE | Freq: Every day | ORAL | 1 refills | Status: DC

## 2019-05-22 MED ORDER — VERAPAMIL HCL ER 180 MG PO TBCR: 180.0000 mg | EXTENDED_RELEASE_TABLET | Freq: Every day | ORAL | 3 refills | Status: DC

## 2019-05-22 MED ORDER — HYDROCHLOROTHIAZIDE 25 MG PO TABS: 25.0000 mg | ORAL_TABLET | Freq: Every day | ORAL | 3 refills | Status: DC

## 2019-05-22 NOTE — Patient Instructions (Addendum)
Sent in sulindac for as needed migraine   It may be that  They tell you have to try  And fail other meds first  For the  somig nasal  Be we can try the oral and see if that is approved .  Sending in effexor   doseing  Can help with anxietty depression and headaches in some people  Specific counseling may help.   Plan rov virtual visit in 3-4 weeks or as needed   Will notify you  of labs when available.  Health Maintenance, Female Adopting a healthy lifestyle and getting preventive care are important in promoting health and wellness. Ask your health care provider about:  The right schedule for you to have regular tests and exams.  Things you can do on your own to prevent diseases and keep yourself healthy. What should I know about diet, weight, and exercise? Eat a healthy diet   Eat a diet that includes plenty of vegetables, fruits, low-fat dairy products, and lean protein.  Do not eat a lot of foods that are high in solid fats, added sugars, or sodium. Maintain a healthy weight Body mass index (BMI) is used to identify weight problems. It estimates body fat based on height and weight. Your health care provider can help determine your BMI and help you achieve or maintain a healthy weight. Get regular exercise Get regular exercise. This is one of the most important things you can do for your health. Most adults should:  Exercise for at least 150 minutes each week. The exercise should increase your heart rate and make you sweat (moderate-intensity exercise).  Do strengthening exercises at least twice a week. This is in addition to the moderate-intensity exercise.  Spend less time sitting. Even light physical activity can be beneficial. Watch cholesterol and blood lipids Have your blood tested for lipids and cholesterol at 59 years of age, then have this test every 5 years. Have your cholesterol levels checked more often if:  Your lipid or cholesterol levels are high.  You are  older than 59 years of age.  You are at high risk for heart disease. What should I know about cancer screening? Depending on your health history and family history, you may need to have cancer screening at various ages. This may include screening for:  Breast cancer.  Cervical cancer.  Colorectal cancer.  Skin cancer.  Lung cancer. What should I know about heart disease, diabetes, and high blood pressure? Blood pressure and heart disease  High blood pressure causes heart disease and increases the risk of stroke. This is more likely to develop in people who have high blood pressure readings, are of African descent, or are overweight.  Have your blood pressure checked: ? Every 3-5 years if you are 8-57 years of age. ? Every year if you are 73 years old or older. Diabetes Have regular diabetes screenings. This checks your fasting blood sugar level. Have the screening done:  Once every three years after age 37 if you are at a normal weight and have a low risk for diabetes.  More often and at a younger age if you are overweight or have a high risk for diabetes. What should I know about preventing infection? Hepatitis B If you have a higher risk for hepatitis B, you should be screened for this virus. Talk with your health care provider to find out if you are at risk for hepatitis B infection. Hepatitis C Testing is recommended for:  Everyone born from  1946 through [birth year].  Anyone with known risk factors for hepatitis C. Sexually transmitted infections (STIs)  Get screened for STIs, including gonorrhea and chlamydia, if: ? You are sexually active and are younger than 59 years of age. ? You are older than 59 years of age and your health care provider tells you that you are at risk for this type of infection. ? Your sexual activity has changed since you were last screened, and you are at increased risk for chlamydia or gonorrhea. Ask your health care provider if you are at  risk.  Ask your health care provider about whether you are at high risk for HIV. Your health care provider may recommend a prescription medicine to help prevent HIV infection. If you choose to take medicine to prevent HIV, you should first get tested for HIV. You should then be tested every 3 months for as long as you are taking the medicine. Pregnancy  If you are about to stop having your period (premenopausal) and you may become pregnant, seek counseling before you get pregnant.  Take 400 to 800 micrograms (mcg) of folic acid every day if you become pregnant.  Ask for birth control (contraception) if you want to prevent pregnancy. Osteoporosis and menopause Osteoporosis is a disease in which the bones lose minerals and strength with aging. This can result in bone fractures. If you are 40 years old or older, or if you are at risk for osteoporosis and fractures, ask your health care provider if you should:  Be screened for bone loss.  Take a calcium or vitamin D supplement to lower your risk of fractures.  Be given hormone replacement therapy (HRT) to treat symptoms of menopause. Follow these instructions at home: Lifestyle  Do not use any products that contain nicotine or tobacco, such as cigarettes, e-cigarettes, and chewing tobacco. If you need help quitting, ask your health care provider.  Do not use street drugs.  Do not share needles.  Ask your health care provider for help if you need support or information about quitting drugs. Alcohol use  Do not drink alcohol if: ? Your health care provider tells you not to drink. ? You are pregnant, may be pregnant, or are planning to become pregnant.  If you drink alcohol: ? Limit how much you use to 0-1 drink a day. ? Limit intake if you are breastfeeding.  Be aware of how much alcohol is in your drink. In the [place] one drink equals one 12 oz bottle of beer (355 mL), one 5 oz glass of wine (148 mL), or one 1 oz glass of hard liquor  (44 mL). General instructions  Schedule regular health, dental, and eye exams.  Stay current with your vaccines.  Tell your health care provider if: ? You often feel depressed. ? You have ever been abused or do not feel safe at home. Summary  Adopting a healthy lifestyle and getting preventive care are important in promoting health and wellness.  Follow your health care provider's instructions about healthy diet, exercising, and getting tested or screened for diseases.  Follow your health care provider's instructions on monitoring your cholesterol and blood pressure. This information is not intended to replace advice given to you by your health care provider. Make sure you discuss any questions you have with your health care provider. Document Released: 12/22/2010 Document Revised: 06/01/2018 Document Reviewed: 06/01/2018 Elsevier Patient Education  2020 [place] is a normal reaction to life events. Stress is what  you feel when life demands more than you are used to, or more than you think you can handle. Some stress can be useful, such as studying for a test or meeting a deadline at work. Stress that occurs too often or for too long can cause problems. It can affect your emotional health and interfere with relationships and normal daily activities. Too much stress can weaken your body's defense system (immune system) and increase your risk for physical illness. If you already have a medical problem, stress can make it worse. What are the causes? All sorts of life events can cause stress. An event that causes stress for one person may not be stressful for another person. Major life events, whether positive or negative, commonly cause stress. Examples include:  Losing a job or starting a new job.  Losing a loved one.  Moving to a new town or home.  Getting married or divorced.  Having a baby.  Injury or illness. Less obvious life events can also cause  stress, especially if they occur day after day or in combination with each other. Examples include:  Working long hours.  Driving in traffic.  Caring for children.  Being in debt.  Being in a difficult relationship. What are the signs or symptoms? Stress can cause emotional symptoms, including:  Anxiety. This is feeling worried, afraid, on edge, overwhelmed, or out of control.  Anger, including irritation or impatience.  Depression. This is feeling sad, down, helpless, or guilty.  Trouble focusing, remembering, or making decisions. Stress can cause physical symptoms, including:  Aches and pains. These may affect your head, neck, back, stomach, or other areas of your body.  Tight muscles or a clenched jaw.  Low energy.  Trouble sleeping. Stress can cause unhealthy behaviors, including:  Eating to feel better (overeating) or skipping meals.  Working too much or putting off tasks.  Smoking, drinking alcohol, or using drugs to feel better. How is this diagnosed? Stress is diagnosed through an assessment by your health care provider. He or she may diagnose this condition based on:  Your symptoms and any stressful life events.  Your medical history.  Tests to rule out other causes of your symptoms. Depending on your condition, your health care provider may refer you to a specialist for further evaluation. How is this treated?  Stress management techniques are the recommended treatment for stress. Medicine is not typically recommended for the treatment of stress. Techniques to reduce your reaction to stressful life events include:  Stress identification. Monitor yourself for symptoms of stress and identify what causes stress for you. These skills may help you to avoid or prepare for stressful events.  Time management. Set your priorities, keep a calendar of events, and learn to say no. Taking these actions can help you avoid making too many commitments. Techniques for  coping with stress include:  Rethinking the problem. Try to think realistically about stressful events rather than ignoring them or overreacting. Try to find the positives in a stressful situation rather than focusing on the negatives.  Exercise. Physical exercise can release both physical and emotional tension. The key is to find a form of exercise that you enjoy and do it regularly.  Relaxation techniques. These relax the body and mind. The key is to find one or more that you enjoy and use the technique(s) regularly. Examples include: ? Meditation, deep breathing, or progressive relaxation techniques. ? Yoga or tai chi. ? Biofeedback, mindfulness techniques, or journaling. ? Listening to music, being  out in nature, or participating in other hobbies.  Practicing a healthy lifestyle. Eat a balanced diet, drink plenty of water, limit or avoid caffeine, and get plenty of sleep.  Having a strong support network. Spend time with family, friends, or other people you enjoy being around. Express your feelings and talk things over with someone you trust. Counseling or talk therapy with a mental health professional may be helpful if you are having trouble managing stress on your own. Follow these instructions at home: Lifestyle   Avoid drugs.  Do not use any products that contain nicotine or tobacco, such as cigarettes and e-cigarettes. If you need help quitting, ask your health care provider.  Limit alcohol intake to no more than 1 drink a day for nonpregnant women and 2 drinks a day for men. One drink equals 12 oz of beer, 5 oz of wine, or 1 oz of hard liquor.  Do not use alcohol or drugs to relax.  Eat a balanced diet that includes fresh fruits and vegetables, whole grains, lean meats, fish, eggs, and beans, and low-fat dairy. Avoid processed foods and foods high in added fat, sugar, and salt.  Exercise at least 30 minutes on 5 or more days each week.  Get 7-8 hours of sleep each  night. General instructions   Practice stress management techniques as discussed with your health care provider.  Drink enough fluid to keep your urine clear or pale yellow.  Take over-the-counter and prescription medicines only as told by your health care provider.  Keep all follow-up visits as told by your health care provider. This is important. Contact a health care provider if:  Your symptoms get worse.  You have new symptoms.  You feel overwhelmed by your problems and can no longer manage them on your own. Get help right away if:  You have thoughts of hurting yourself or others. If you ever feel like you may hurt yourself or others, or have thoughts about taking your own life, get help right away. You can go to your nearest emergency department or call:  Your local emergency services (911 in the [place]  A suicide crisis helpline, such as the [facility] at 781 546 6646. This is open 24 hours a day. Summary  Stress is a normal reaction to life events. It can cause problems if it happens too often or for too long.  Practicing stress management techniques is the best way to treat stress.  Counseling or talk therapy with a mental health professional may be helpful if you are having trouble managing stress on your own. This information is not intended to replace advice given to you by your health care provider. Make sure you discuss any questions you have with your health care provider. Document Released: 12/02/2000 Document Revised: 05/21/2017 Document Reviewed: 07/29/2016 Elsevier Patient Education  2020 [employer].

## 2019-05-25 ENCOUNTER — Other Ambulatory Visit: Payer: Self-pay

## 2019-05-25 DIAGNOSIS — D582 Other hemoglobinopathies: Secondary | ICD-10-CM

## 2019-05-26 DIAGNOSIS — M545 Low back pain: Secondary | ICD-10-CM | POA: Diagnosis not present

## 2019-06-08 ENCOUNTER — Other Ambulatory Visit: Payer: Self-pay

## 2019-06-09 ENCOUNTER — Ambulatory Visit (INDEPENDENT_AMBULATORY_CARE_PROVIDER_SITE_OTHER): Payer: BC Managed Care – PPO | Admitting: Gynecology

## 2019-06-09 ENCOUNTER — Encounter: Payer: Self-pay | Admitting: Gynecology

## 2019-06-09 VITALS — BP 130/80 | Ht 67.0 in | Wt 206.0 lb

## 2019-06-09 DIAGNOSIS — Z01419 Encounter for gynecological examination (general) (routine) without abnormal findings: Secondary | ICD-10-CM

## 2019-06-09 DIAGNOSIS — N952 Postmenopausal atrophic vaginitis: Secondary | ICD-10-CM

## 2019-06-09 NOTE — Progress Notes (Signed)
    MAHATHI WIECHMAN 12/13/1960 [id number]        58 y.o.  G1P1001 for annual gynecologic exam.  Without gynecologic complaints  Past medical history,surgical history, problem list, medications, allergies, family history and social history were all reviewed and documented as reviewed in the EPIC chart.  ROS:  Performed with pertinent positives and negatives included in the history, assessment and plan.   Additional significant findings : None   Exam: Caryn Bee assistant Vitals:   06/09/19 1410  BP: 130/80  Weight: 206 lb (93.4 kg)  Height: 5\' 7"  (1.702 m)   Body mass index is 32.26 kg/m.  General appearance:  Normal affect, orientation and appearance. Skin: Grossly normal HEENT: Without gross lesions.  No cervical or supraclavicular adenopathy. Thyroid normal.  Lungs:  Clear without wheezing, rales or rhonchi Cardiac: RR, without RMG Abdominal:  Soft, nontender, without masses, guarding, rebound, organomegaly or hernia Breasts:  Examined lying and sitting without masses, retractions, discharge or axillary adenopathy. Pelvic:  Ext, BUS, Vagina: Normal with atrophic changes  Cervix: Normal with atrophic changes  Uterus: Grossly normal, midline mobile nontender.  Adnexa: Without masses or tenderness    Anus and perineum: Normal   Rectovaginal: Normal sphincter tone without palpated masses or tenderness.    Assessment/Plan:  58 y.o. G73P1001 female for annual gynecologic exam.   1. Postmenopausal/atrophic genital changes.  No significant menopausal symptoms or any vaginal bleeding. 2. Mammography 11/2018.  Breast exam normal today.  Patient notes right breast little larger than the left.  Has lost 20 pounds over the past year.  Breast exam is totally normal noting slight asymmetry.  Suspect secondary to weight loss and probable asymmetry not noted in the past.  Patient is going to continue with self breast exams and as long as no palpable masses or other changes will follow.  We  will follow-up when due for her annual mammogram in 6 months. 3. Pap smear/HPV 05/2018.  No Pap smear done today.  No history of significant abnormal Pap smears.  Plan repeat Pap smear/HPV at 5-year interval per current screening guidelines. 4. Colonoscopy 2015.  Repeat at their recommended interval. 5. DEXA never.  Will plan next year at age 41. 94. Health maintenance.  No routine lab work done as patient does this elsewhere.  Follow-up 1 year, sooner as needed.   Anastasio Auerbach MD, 2:45 PM 06/09/2019

## 2019-06-09 NOTE — Patient Instructions (Signed)
Follow-up in 1 year for annual exam, sooner if any issues. 

## 2019-06-12 DIAGNOSIS — L821 Other seborrheic keratosis: Secondary | ICD-10-CM | POA: Diagnosis not present

## 2019-06-12 DIAGNOSIS — L820 Inflamed seborrheic keratosis: Secondary | ICD-10-CM | POA: Diagnosis not present

## 2019-07-22 ENCOUNTER — Emergency Department (HOSPITAL_COMMUNITY): Payer: BC Managed Care – PPO

## 2019-07-22 ENCOUNTER — Encounter (HOSPITAL_COMMUNITY): Payer: Self-pay | Admitting: Emergency Medicine

## 2019-07-22 ENCOUNTER — Emergency Department (HOSPITAL_COMMUNITY)
Admission: EM | Admit: 2019-07-22 | Discharge: 2019-07-23 | Disposition: A | Discharge status: Home / Self Care | Payer: BC Managed Care – PPO | Attending: Emergency Medicine | Admitting: Emergency Medicine

## 2019-07-22 ENCOUNTER — Other Ambulatory Visit: Payer: Self-pay

## 2019-07-22 DIAGNOSIS — I10 Essential (primary) hypertension: Secondary | ICD-10-CM | POA: Insufficient documentation

## 2019-07-22 DIAGNOSIS — R918 Other nonspecific abnormal finding of lung field: Secondary | ICD-10-CM | POA: Diagnosis not present

## 2019-07-22 DIAGNOSIS — R079 Chest pain, unspecified: Secondary | ICD-10-CM | POA: Diagnosis not present

## 2019-07-22 DIAGNOSIS — R0789 Other chest pain: Secondary | ICD-10-CM | POA: Diagnosis not present

## 2019-07-22 DIAGNOSIS — I447 Left bundle-branch block, unspecified: Secondary | ICD-10-CM | POA: Diagnosis not present

## 2019-07-22 DIAGNOSIS — N281 Cyst of kidney, acquired: Secondary | ICD-10-CM | POA: Diagnosis not present

## 2019-07-22 DIAGNOSIS — R911 Solitary pulmonary nodule: Secondary | ICD-10-CM | POA: Insufficient documentation

## 2019-07-22 DIAGNOSIS — Z79899 Other long term (current) drug therapy: Secondary | ICD-10-CM | POA: Insufficient documentation

## 2019-07-22 LAB — BASIC METABOLIC PANEL
Anion gap: 10 (ref 5–15)
BUN: 16 mg/dL (ref 6–20)
CO2: 27 mmol/L (ref 22–32)
Calcium: 9.1 mg/dL (ref 8.9–10.3)
Chloride: 104 mmol/L (ref 98–111)
Creatinine, Ser: 0.85 mg/dL (ref 0.44–1.00)
GFR calc Af Amer: >60 mL/min (ref >60)
GFR calc non Af Amer: >60 mL/min (ref >60)
Glucose, Bld: 122 mg/dL — ABNORMAL HIGH (ref 70–99)
Potassium: 3.6 mmol/L (ref 3.5–5.1)
Sodium: 141 mmol/L (ref 135–145)

## 2019-07-22 LAB — CBC
HCT: 41.9 % (ref 36.0–46.0)
Hemoglobin: 13.5 g/dL (ref 12.0–15.0)
MCH: 29.1 pg (ref 26.0–34.0)
MCHC: 32.2 g/dL (ref 30.0–36.0)
MCV: 90.3 fL (ref 80.0–100.0)
Platelets: 349 10*3/uL (ref 150–400)
RBC: 4.64 MIL/uL (ref 3.87–5.11)
RDW: 14.2 % (ref 11.5–15.5)
WBC: 9.4 10*3/uL (ref 4.0–10.5)
nRBC: 0 % (ref 0.0–0.2)

## 2019-07-22 LAB — TROPONIN I (HIGH SENSITIVITY)
Troponin I (High Sensitivity): 4 ng/L (ref <18)
Troponin I (High Sensitivity): 4 ng/L (ref <18)

## 2019-07-22 LAB — I-STAT BETA HCG BLOOD, ED (MC, WL, AP ONLY): I-stat hCG, quantitative: <5 m[IU]/mL (ref <5)

## 2019-07-22 MED ORDER — SODIUM CHLORIDE 0.9% FLUSH
3.0000 mL | Freq: Once | INTRAVENOUS | Status: AC
  Administered 2019-07-22: 3 mL via INTRAVENOUS

## 2019-07-22 MED ORDER — METHOCARBAMOL 500 MG PO TABS: 500.0000 mg | ORAL_TABLET | Freq: Three times a day (TID) | ORAL | 0 refills | Status: DC | PRN

## 2019-07-22 MED ORDER — MORPHINE SULFATE (PF) 2 MG/ML IV SOLN
2.0000 mg | Freq: Once | INTRAVENOUS | Status: AC
  Administered 2019-07-22: 2 mg via INTRAVENOUS
  Filled 2019-07-22: qty 1

## 2019-07-22 MED ORDER — ASPIRIN 81 MG PO CHEW
324.0000 mg | CHEWABLE_TABLET | Freq: Once | ORAL | Status: AC
  Administered 2019-07-22: 324 mg via ORAL
  Filled 2019-07-22: qty 4

## 2019-07-22 MED ORDER — NITROGLYCERIN 0.4 MG SL SUBL: 0.4000 mg | SUBLINGUAL_TABLET | SUBLINGUAL | Status: DC | PRN

## 2019-07-22 MED ORDER — IOHEXOL 350 MG/ML SOLN
100.0000 mL | Freq: Once | INTRAVENOUS | Status: AC | PRN
  Administered 2019-07-22: 100 mL via INTRAVENOUS

## 2019-07-22 NOTE — ED Provider Notes (Signed)
[facility] EMERGENCY DEPARTMENT Provider Note   CSN: [id number] Arrival date & time: 07/22/19  2005     History Chief Complaint  Patient presents with  . Chest Pain    Laura Smith is a 59 y.o. female with a history of hypertension, migraines, and basal cell carcinoma s/p resection who presents to the ED with complaints of chest discomfort today. Patient states she has had pain between her shoulder blades since this morning, this has been constant, difficult to describe states it just hurts.. Did feel a bit lightheaded earlier with the pain but no syncope. This evening about 1 hour PTA she developed some chest tightness, substernal, more to the left, radiates into the LUE some with associated LUE paresthesias. She felt a bit short of breath but was unsure if this was secondary to concern over her sxs. No alleviating/aggravating factors to her overall sxs. Current discomfort is about a 7/10 in severity. Denies nausea, vomiting, diaphoresis,  leg pain/swelling, hemoptysis, recent surgery/trauma, recent long travel, hormone use, personal hx of cancer,  hx of DVT/PE, fever, or cough. Denies prior hx of CAD, had a stress test about 4-5 years ago which she thinks was reassuring, she has not required stents in the past, states she is followed by Dr. Einar Gip. Denies use of cocaine/methamphetamines.    HPI     Past Medical History:  Diagnosis Date  . Allergy   . Breast cyst   . Endometriosis    surgery laser 2001  . Hemorrhoids   . Hx of basal cell carcinoma   . Hypertension   . Migraine   . Urinary tract infection     Patient Active Problem List   Diagnosis Date Noted  . Angina at rest [facility]) 04/06/2017  . Seasonal allergies 04/13/2014  . Fasting hyperglycemia 04/13/2014  . Skin lesion 04/13/2014  . Pain of left heel 11/21/2013  . Visit for preventive health examination 04/11/2013  . HTN (hypertension) 04/11/2013  . Hx of dizziness 04/11/2013  . Interstitial  cystitis 09/15/2012  . Back pain 09/15/2012  . Allergic rhinitis 07/10/2012  . Edema of both legs 07/10/2012  . BMI 34.0-34.9,adult 07/10/2012  . Frequent stools 07/10/2012  . Hypertension   . Endometriosis   . Hemorrhoids   . IC (interstitial cystitis)   . Migraine     Past Surgical History:  Procedure Laterality Date  . [place]  . HYSTEROSCOPY  08/1999   AT TIME OF LAPAROSCOPY  . PELVIC LAPAROSCOPY  08/1999   AND HYSTEROSCOPY--ENDOMETRIOSIS     OB History    Gravida  1   Para  1   Term  1   Preterm      AB      Living  1     SAB      TAB      Ectopic      Multiple      Live Births              Family History  Problem Relation Age of Onset  . Breast cancer Mother        Age 54  . Colon cancer Mother 9  . Breast cancer Cousin        Age 52's  . Heart disease Father   . Hypertension Paternal Grandmother   . Heart disease Paternal Grandmother   . Stroke Paternal Grandmother   .  Cancer Paternal Grandmother        Lung  . Cancer Paternal Grandfather        Prostate  . Stroke Maternal Grandfather     Social History   Tobacco Use  . Smoking status: Never Smoker  . Smokeless tobacco: Never Used  Substance Use Topics  . Alcohol use: No    Alcohol/week: 0.0 standard drinks  . Drug use: No    Home Medications Prior to Admission medications   Medication Sig Start Date End Date Taking? Authorizing Provider  Ascorbic Acid (VITAMIN C) 500 MG CAPS Take 1,000 mg by mouth every morning.     [provider]  b complex vitamins tablet Take 2 tablets by mouth daily.     [provider]  CALCIUM-MAGNESIUM PO Take 3 tablets by mouth daily.     [provider]  cetirizine (ZYRTEC) 10 MG tablet Take 10 mg by mouth daily.    [provider]  cyclobenzaprine (FLEXERIL) 10 MG tablet Take 10 mg by mouth as needed for muscle spasms.  01/25/17    [provider]  hydrochlorothiazide (HYDRODIURIL) 25 MG tablet Take 1 tablet (25 mg total) by mouth daily. 05/22/19   Panosh, [name], MD  HYDROcodone-acetaminophen (NORCO) 7.5-325 MG per tablet Take 1-2 tablets by mouth every 4 (four) hours as needed for pain (as needed for migraine). Reported on 09/23/2015    Michel Santee, MD  HYDROmorphone (DILAUDID) 4 MG tablet Take 4-8 mg by mouth every 4 (four) hours as needed for pain (rescure for migraine). Reported on 09/23/2015    Michel Santee, MD  meclizine (ANTIVERT) 25 MG tablet TAKE 2 TABLET (50 MG TOTAL) BY MOUTH 2 (TWO) TIMES DAILY AS NEEDED (FOR TRAVEL). Patient not taking: Reported on 06/09/2019 12/21/17   Panosh, [name], MD  Multiple Vitamins-Iron (MULTIVITAMIN/IRON) TABS Take 1 tablet by mouth daily.     [provider]  nystatin (NYSTATIN) powder Apply topically 4 (four) times daily. As needed for rash 11/22/18   Fontaine, Belinda Block, MD  sulindac (CLINORIL) 200 MG tablet Take 1 tablet (200 mg total) by mouth 2 (two) times daily as needed (headache). Reported on 09/23/2015 05/22/19   Panosh, [name], MD  SUMAtriptan (IMITREX) 6 MG/0.5ML SOLN injection Inject 6 mg into the skin every 2 (two) hours as needed for migraine. Reported on 09/23/2015    [provider]  traMADol (ULTRAM) 50 MG tablet Take 50 mg by mouth as needed for severe pain.  01/19/17   [provider]  venlafaxine XR (EFFEXOR XR) 37.5 MG 24 hr capsule Take 1 capsule (37.5 mg total) by mouth daily with breakfast. Increase to 75 mg in am( 2 capsules) after 1 week. Patient not taking: Reported on 06/09/2019 05/22/19   Panosh, [name], MD  verapamil (CALAN-SR) 180 MG CR tablet Take 1 tablet (180 mg total) by mouth at bedtime. 05/22/19   Panosh, [name], MD  zolmitriptan (ZOMIG ZMT) 5 MG disintegrating tablet Take 1 tablet (5 mg total) by mouth as needed for migraine. May repeat in 2 hours 05/22/19   Panosh, [name], MD    Allergies    Etodolac, Latex, Other,  and Adhesive [tape]  Review of Systems   Review of Systems  Constitutional: Negative for chills and fever.  Eyes: Negative for visual disturbance.  Respiratory: Positive for shortness of breath.   Cardiovascular: Positive for chest pain.  Gastrointestinal: Negative for abdominal pain, diarrhea, nausea and vomiting.  Genitourinary: Negative for dysuria.  Musculoskeletal: Positive for back pain.  Neurological: Positive for light-headedness and numbness (LUE). Negative for syncope, speech difficulty, weakness and headaches.       Negative for incontinence or saddle anesthesia.   All other systems reviewed and are negative.   Physical Exam Updated Vital Signs BP 136/76 (BP Location: Right Arm)   Pulse 81   Temp 97.9 F (36.6 C) (Oral)   Ht 5\' 7"  (1.702 m)   Wt 95.3 kg   SpO2 97%   BMI 32.89 kg/m   Physical Exam Vitals and nursing note reviewed.  Constitutional:      General: She is not in acute distress.    Appearance: Normal appearance. She is not toxic-appearing.  HENT:     Head: Normocephalic and atraumatic.     Mouth/Throat:     Pharynx: Oropharynx is clear. Uvula midline.  Eyes:     General: Vision grossly intact. Gaze aligned appropriately.     Extraocular Movements: Extraocular movements intact.     Conjunctiva/sclera: Conjunctivae normal.     Pupils: Pupils are equal, round, and reactive to light.     Comments: No proptosis.   Cardiovascular:     Rate and Rhythm: Normal rate and regular rhythm.     Pulses:          Radial pulses are 2+ on the right side and 2+ on the left side.  Pulmonary:     Effort: Pulmonary effort is normal.     Breath sounds: Normal breath sounds.  Chest:     Chest wall: Tenderness (left anterior chest wall without overlying skin changes or crepitus) present.  Abdominal:     General: There is no distension.     Palpations: Abdomen is soft.     Tenderness: There is no abdominal tenderness. There is no guarding or rebound.    Musculoskeletal:     Cervical back: Normal range of motion and neck supple. No rigidity.     Right lower leg: No tenderness. No edema.     Left lower leg: No tenderness. No edema.     Comments: Back: Tender to palpation to the upper midline thoracic region extending to paraspinal muscles. No point/focal vertebral tenderness UEs: Intact AROM. No point focal bony tenderness.   Skin:    General: Skin is warm and dry.  Neurological:     Mental Status: She is alert.     Comments: Alert. Clear speech. No facial droop. CNIII-XII grossly intact. Bilateral upper and lower extremities sensation intact to sharp/dull touch. 5/5 symmetric strength with grip strength and with plantar and dorsi flexion bilaterally . Normal finger to nose bilaterally. Negative pronator drift. Gait intact.    Psychiatric:        Mood and Affect: Mood normal.        Behavior: Behavior normal.    ED Results / Procedures / Treatments   Labs (all labs ordered are listed, but only abnormal results are displayed) Labs Reviewed  CBC  BASIC METABOLIC PANEL  I-STAT BETA HCG BLOOD, ED ([facility], [facility], [facility] ONLY)  TROPONIN I (HIGH SENSITIVITY)    EKG None  Radiology DG Chest 2 View  Result Date: 07/22/2019 CLINICAL DATA:  Pain. EXAM: CHEST - 2 VIEW COMPARISON:  April 05, 2017 FINDINGS: No pneumothorax. The heart, hila, mediastinum, and pleura are normal. Mild opacity in left base favored represent atelectasis. No other acute abnormalities. IMPRESSION: Mild opacity in left base is probably mild atelectasis. Recommend clinical correlation. No other acute abnormalities. Electronically Signed  By: Dorise Bullion III M.D   On: 07/22/2019 20:50   CT Angio Chest/Abd/Pel for Dissection W and/or W/WO  Result Date: 07/22/2019 CLINICAL DATA:  Chest pain and back pain. Concern for aortic dissection. EXAM: CT ANGIOGRAPHY CHEST, ABDOMEN AND PELVIS TECHNIQUE: Multidetector CT imaging through the chest, abdomen and pelvis was performed using  the standard protocol during bolus administration of intravenous contrast. Multiplanar reconstructed images and MIPs were obtained and reviewed to evaluate the vascular anatomy. CONTRAST:  156mL OMNIPAQUE IOHEXOL 350 MG/ML SOLN COMPARISON:  None. FINDINGS: CTA CHEST FINDINGS Cardiovascular: There is no evidence for a thoracic aortic aneurysm or dissection. There is a crescentic hypodensity along the anterior margin of the ascending aorta favored to represent pulsation artifact as opposed to a dissection or acute intramural hematoma. Evaluation is limited on this non gated study. The heart size is normal. There is no evidence for a pulmonary embolism. Mediastinum/Nodes: --No mediastinal or hilar lymphadenopathy. --No axillary lymphadenopathy. --No supraclavicular lymphadenopathy. --Normal thyroid gland. --The esophagus is unremarkable Lungs/Pleura: There is a 5 mm pulmonary nodule in the posteromedial right upper lobe (axial series 8, image 36). There is a 1.2 cm ground-glass airspace opacity in the medial left upper lobe (axial series 8, image 24). There is no pneumothorax. No large pleural effusion. Musculoskeletal: No chest wall abnormality. No acute or significant osseous findings. Review of the MIP images confirms the above findings. CTA ABDOMEN AND PELVIS FINDINGS VASCULAR Aorta: Normal caliber aorta without aneurysm, dissection, vasculitis or significant stenosis. Celiac: Patent without evidence of aneurysm, dissection, vasculitis or significant stenosis. SMA: Patent without evidence of aneurysm, dissection, vasculitis or significant stenosis. Renals: Both renal arteries are patent without evidence of aneurysm, dissection, vasculitis, fibromuscular dysplasia or significant stenosis. IMA: Patent without evidence of aneurysm, dissection, vasculitis or significant stenosis. Inflow: Patent without evidence of aneurysm, dissection, vasculitis or significant stenosis. Veins: No obvious venous abnormality within the  limitations of this arterial phase study. Review of the MIP images confirms the above findings. NON-VASCULAR Hepatobiliary: The liver is normal. Normal gallbladder.There is no biliary ductal dilation. Pancreas: Normal contours without ductal dilatation. No peripancreatic fluid collection. Spleen: No splenic laceration or hematoma. Adrenals/Urinary Tract: --Adrenal glands: No adrenal hemorrhage. --Right kidney/ureter: There are few small indeterminate cystic structures arising from the lower pole of the left kidney measuring up to approximately 1.2 cm (axial series 6, image 181). These were previously described as simple cysts on April 30, 2017. --Left kidney/ureter: No hydronephrosis or perinephric hematoma. --Urinary bladder: Unremarkable. Stomach/Bowel: --Stomach/Duodenum: No hiatal hernia or other gastric abnormality. Normal duodenal course and caliber. --Small bowel: No dilatation or inflammation. --Colon: No focal abnormality. --Appendix: Not visualized. No right lower quadrant inflammation or free fluid. Lymphatic: --No retroperitoneal lymphadenopathy. --No mesenteric lymphadenopathy. --No pelvic or inguinal lymphadenopathy. Reproductive: Unremarkable Other: No ascites or free air. The abdominal wall is normal. Musculoskeletal. No acute displaced fractures. Review of the MIP images confirms the above findings. IMPRESSION: 1. No evidence of a thoracic aortic aneurysm or dissection. 2. There is a 5 mm pulmonary nodule in the right upper lobe as well as a 1.2 cm ground-glass airspace opacity in the left upper lobe. Follow-up is recommended. Non-contrast chest CT at 3-6 months is recommended. If the nodules are stable at time of repeat CT, then future CT at 18-24 months (from today's scan) is considered optional for low-risk patients, but is recommended for high-risk patients. This recommendation follows the consensus statement: Guidelines for Management of Incidental Pulmonary Nodules Detected on CT Images:  From the Fleischner Society 2017; Radiology 2017; [phone]. 3. No acute findings in the chest, abdomen or pelvis. Electronically Signed   By: Constance Holster M.D.   On: 07/22/2019 23:08    Procedures Procedures (including critical care time)  Medications Ordered in ED Medications  sodium chloride flush (NS) 0.9 % injection 3 mL (3 mLs Intravenous Given 07/22/19 2213)  aspirin chewable tablet 324 mg (324 mg Oral Given 07/22/19 2143)  morphine 2 MG/ML injection 2 mg (2 mg Intravenous Given 07/22/19 2207)  iohexol (OMNIPAQUE) 350 MG/ML injection 100 mL (100 mLs Intravenous Contrast Given 07/22/19 2233)    ED Course  I have reviewed the triage vital signs and the nursing notes.  Pertinent labs & imaging results that were available during my care of the patient were reviewed by me and considered in my medical decision making (see chart for details).    Laura Smith was evaluated in Emergency Department on 07/22/2019 for the symptoms described in the history of present illness. He/she was evaluated in the context of the global COVID-19 pandemic, which necessitated consideration that the patient might be at risk for infection with the SARS-CoV-2 virus that causes COVID-19. Institutional protocols and algorithms that pertain to the evaluation of patients at risk for COVID-19 are in a state of rapid change based on information released by regulatory bodies including the CDC and federal and state organizations. These policies and algorithms were followed during the patient's care in the ED.  MDM Rules/Calculators/A&P                      Patient presents to the ED with complaints of back & chest discomfort. Nontoxic, vitals WNL. Exam with some anterior left chest wall tenderness & some thoracic tenderness to palpation. No focal deficits. Symmetric pulses.   Prior heart catheterization reviewed: N/A Patient's primary cardiologist: Dr. Einar Gip  DDX: ACS, pulmonary embolism, dissection,  pneumothorax, pneumonia, arrhythmia, severe anemia, MSK, GERD, anxiety. Given isolated paresthesias to the LUE, sensation intact to sharp/dull touch on exam, and no focal neuro deficits do not suspect acute CVA. Evaluation initiated with labs, EKG, and CXR. Patient on cardiac monitor.   EKG: LBBB, most recent on record from 2 years prior- will discuss w/ cardiology CBC: No anemia/leukocytosis.  BMP: No electrolyte derangement.  Troponin: 4 [id number] opacity in left base is probably mild atelectasis. Recommend clinical correlation. No other acute abnormalities.--> no cough/fever  21:38: CONSULT: Discussed with cardiologist Dr. Virgina Jock, on call for [facility], new LBBB not necessarily new onset with no recent EKGs, in agreement with Aspirin, recommends consideration of CTA to evaluate for dissection, if no dissection and reassuring delta troponin can be discharged with cardiology follow up outpatient. Appreciate consultation.   Repeat troponin flat @ 4.  CTA: No evidence of a thoracic aortic aneurysm or dissection. There is a 5 mm pulmonary nodule in the right upper lobe as well as a 1.2 cm ground-glass airspace opacity in the left upper lobe. Follow-up is recommended.  Troponin flat, doubt ACS, follow up with cardiology as outpatient as discussed with her cardiology team. Patient is low risk wells, no obvious VTE On CTA, doubt pulmonary embolism. CTA negative for dissection.  Cardiac monitor reviewed, no notable arrhythmias or tachycardia. Patient feeling somewhat better. Unclear cause of pain, given some pain with palpation will trial robaxin for muscular type pain- discussed no driving/operating heavy machinery with this medicine. Patient has appeared hemodynamically stable throughout ER visit and appears safe for discharge  with close PCP/cardiology follow up. I discussed results, treatment plan, need for PCP follow-up, and return precautions with the patient. Provided opportunity for  questions, patient confirmed understanding and is in agreement with plan.   Findings and plan of care discussed with supervising physician Dr. Rex Kras who is in agreement.    Final Clinical Impression(s) / ED Diagnoses Final diagnoses:  Chest pain, unspecified type  Pulmonary nodule    Rx / DC Orders ED Discharge Orders         Ordered    methocarbamol (ROBAXIN) 500 MG tablet  Every 8 hours PRN     07/22/19 2323           Amaryllis Dyke, PA-C 07/22/19 2325    Little, Wenda Overland, MD 07/23/19 [phone]

## 2019-07-22 NOTE — Discharge Instructions (Addendum)
You were seen in the emergency department today for chest pain. Your work-up in the emergency department has been overall reassuring. Your labs have been fairly normal and or similar to previous blood work you have had done. The enzyme we use to check your heart did not show an acute heart attack at this time. The imaging of your chest did not show a dissection. There were some abnormal findings including a pulmonary nodule in the right upper lung as well as a ground glass opacity (abnormal appearance) in the left upper lung. With these abnormal findings it is recommended that you have a repeat non contrast chest CT in 3-6 months- this can be ordered by your primary care provider outpatient.   We are sending you home with a prescription for robaxin to see if this helps with discomfort.   - Robaxin is the muscle relaxer I have prescribed, this is meant to help with muscle tightness. Be aware that this medication may make you drowsy therefore the first time you take this it should be at a time you are in an environment where you can rest. Do not drive or operate heavy machinery when taking this medication. Do not drink alcohol or take other sedating medications with this medicine such as narcotics or benzodiazepines.   You make take Tylenol per over the counter dosing with these medications.   We have prescribed you new medication(s) today. Discuss the medications prescribed today with your pharmacist as they can have adverse effects and interactions with your other medicines including over the counter and prescribed medications. Seek medical evaluation if you start to experience new or abnormal symptoms after taking one of these medicines, seek care immediately if you start to experience difficulty breathing, feeling of your throat closing, facial swelling, or rash as these could be indications of a more serious allergic reaction  We would like you to follow up closely with your primary care provider and/or  the cardiologist provided in your discharge instructions within 1-3 days. Return to the ER immediately should you experience any new or worsening symptoms including but not limited to return of pain, worsened pain, vomiting, shortness of breath, dizziness, lightheadedness, passing out, or any other concerns that you may have.

## 2019-07-22 NOTE — ED Triage Notes (Signed)
Pt c/o left side cp radiating to her left arm and shoulder blade since this morning getting worse tonight with some SOB.

## 2019-07-22 NOTE — ED Notes (Signed)
Pt to CT

## 2019-07-22 NOTE — ED Notes (Signed)
Pt requesting to go to bathroom prior to [place] be able to get Vitals or get EKG in the first 10 min of arrival.

## 2019-07-22 NOTE — ED Notes (Signed)
IV team at bedside; 2mg  Morphine administered; pt reported that it made her chest feel heavy and that she didn't like the way it made her feel. Primary RN notified.

## 2019-07-23 NOTE — ED Notes (Signed)
Pt verbalized understanding of d/c instructions, follow up care, prescriptions and s/s requiring return to ED. Pt had no further questions at this time

## 2019-08-04 ENCOUNTER — Telehealth: Payer: Self-pay | Admitting: Internal Medicine

## 2019-08-04 DIAGNOSIS — R079 Chest pain, unspecified: Secondary | ICD-10-CM

## 2019-08-04 NOTE — Telephone Encounter (Signed)
Pt went to the ER a couple weeks ago with heart-attack like symptoms. The ER doc told her to follow up with a heart doctor.  Pt would like a referral to the doc her husband goes to.  Cardiologist-Dr. Minus Breeding with [facility]  Pt can be reached at [phone]

## 2019-08-04 NOTE — Telephone Encounter (Signed)
Ok to place referral   For chest pain   And then she can arrange  Fu visit  In person or virtual after  Evaluation

## 2019-08-04 NOTE — Telephone Encounter (Signed)
Please advise okay to place referral?

## 2019-08-07 NOTE — Telephone Encounter (Signed)
I left a detailed message at the pts cell number with the information below.  Referral placed and message left stating someone will call with appt info.

## 2019-08-11 ENCOUNTER — Ambulatory Visit: Payer: Self-pay | Admitting: Cardiology

## 2019-08-23 DIAGNOSIS — D1801 Hemangioma of skin and subcutaneous tissue: Secondary | ICD-10-CM | POA: Diagnosis not present

## 2019-08-23 DIAGNOSIS — L812 Freckles: Secondary | ICD-10-CM | POA: Diagnosis not present

## 2019-08-23 DIAGNOSIS — L821 Other seborrheic keratosis: Secondary | ICD-10-CM | POA: Diagnosis not present

## 2019-09-07 DIAGNOSIS — Z7189 Other specified counseling: Secondary | ICD-10-CM | POA: Insufficient documentation

## 2019-09-07 DIAGNOSIS — R072 Precordial pain: Secondary | ICD-10-CM | POA: Insufficient documentation

## 2019-09-07 NOTE — Progress Notes (Signed)
Cardiology Office Note   Date:  09/08/2019   ID:  Laura Smith, DOB May 28, 1961, MRN [id number]  PCP:  Burnis Medin, MD  Cardiologist:   No primary care provider on file. Referring:  ED  Chief Complaint  Patient presents with  . Chest Pain      History of Present Illness: Laura Smith is a 59 y.o. female who presents for evaluation of chest discomfort.  I do notice that she was in the hospital in 2018.  She does not remember the symptoms.  There was chest discomfort discussed.  Stress perfusion study in 2018 demonstrated no ischemia.  She had this when she was in the hospital with chest pain.    She had an echo in 2019 with normal EF.       She most recently was in the hospital on 30 January with chest discomfort.  She was doing a lot of work around the house.  She felt chest tightness.  It was just a feeling of discomfort that was somewhat moderate and did not go away with Tums.  She did not have any shortness of breath.  When her left arm felt numb she thought she should go to the emergency room.  There she developed severe pain between her shoulder blades..  She did rule out for myocardial infarction.  Her EKG demonstrates an interventricular conduction delay this borderline left bundle branch block that is new compared to previous.  She had CT on 07/22/19.  She had a 5 mm nodule in the right upper lobe but no evidence of aortic dissection or aneurysm.  She was in the ED with chest pain.  I reviewed these records for this visit.      Since that episode she has not had any further symptoms.  She is active around the house.  She denies chest pressure, neck or arm discomfort.  She is not had any palpitations, presyncope or syncope.  Has had no PND orthopnea.  She can walk up 20 flights of stairs without symptoms.  Past Medical History:  Diagnosis Date  . Allergy   . Breast cyst   . Endometriosis    surgery laser 2001  . Hemorrhoids   . Hx of basal cell carcinoma   .  Hypertension   . Migraine   . Urinary tract infection     Past Surgical History:  Procedure Laterality Date  . [place]  . HYSTEROSCOPY  08/1999   AT TIME OF LAPAROSCOPY  . PELVIC LAPAROSCOPY  08/1999   AND HYSTEROSCOPY--ENDOMETRIOSIS     Current Outpatient Medications  Medication Sig Dispense Refill  . Ascorbic Acid (VITAMIN C) 500 MG CAPS Take 1,000 mg by mouth every morning.     Marland Kitchen b complex vitamins tablet Take 2 tablets by mouth daily.     Marland Kitchen CALCIUM-MAGNESIUM PO Take 3 tablets by mouth daily.     . cetirizine (ZYRTEC) 10 MG tablet Take 10 mg by mouth daily.    . cyclobenzaprine (FLEXERIL) 10 MG tablet Take 10 mg by mouth as needed for muscle spasms.     . hydrochlorothiazide (HYDRODIURIL) 25 MG tablet Take 1 tablet (25 mg total) by mouth daily. 90 tablet 3  . HYDROcodone-acetaminophen (NORCO) 7.5-325 MG per tablet Take 1-2 tablets by mouth every 4 (four) hours as needed for pain (as needed for migraine). Reported on 09/23/2015    .  HYDROmorphone (DILAUDID) 4 MG tablet Take 4-8 mg by mouth every 4 (four) hours as needed for pain (rescure for migraine). Reported on 09/23/2015    . meclizine (ANTIVERT) 25 MG tablet TAKE 2 TABLET (50 MG TOTAL) BY MOUTH 2 (TWO) TIMES DAILY AS NEEDED (FOR TRAVEL). 30 tablet 0  . methocarbamol (ROBAXIN) 500 MG tablet Take 1 tablet (500 mg total) by mouth every 8 (eight) hours as needed for muscle spasms. 15 tablet 0  . Multiple Vitamins-Iron (MULTIVITAMIN/IRON) TABS Take 1 tablet by mouth daily.     Marland Kitchen nystatin (NYSTATIN) powder Apply topically 4 (four) times daily. As needed for rash 30 g 2  . sulindac (CLINORIL) 200 MG tablet Take 1 tablet (200 mg total) by mouth 2 (two) times daily as needed (headache). Reported on 09/23/2015 30 tablet 3  . SUMAtriptan (IMITREX) 6 MG/0.5ML SOLN injection Inject 6 mg into the skin every 2 (two) hours as needed for migraine. Reported on 09/23/2015    .  traMADol (ULTRAM) 50 MG tablet Take 50 mg by mouth as needed for severe pain.     Marland Kitchen venlafaxine XR (EFFEXOR XR) 37.5 MG 24 hr capsule Take 1 capsule (37.5 mg total) by mouth daily with breakfast. Increase to 75 mg in am( 2 capsules) after 1 week. 60 capsule 1  . verapamil (CALAN-SR) 180 MG CR tablet Take 1 tablet (180 mg total) by mouth at bedtime. 90 tablet 3  . zolmitriptan (ZOMIG ZMT) 5 MG disintegrating tablet Take 1 tablet (5 mg total) by mouth as needed for migraine. May repeat in 2 hours 10 tablet 2   Current Facility-Administered Medications  Medication Dose Route Frequency Provider Last Rate Last Admin  . methylPREDNISolone acetate (DEPO-MEDROL) injection 120 mg  120 mg Intramuscular Once Panosh, [name], MD        Allergies:   Etodolac, Latex, Other, and Adhesive [tape]    Social History:  The patient  reports that she has never smoked. She has never used smokeless tobacco. She reports that she does not drink alcohol or use drugs.   Family History:  The patient's family history includes Breast cancer in her cousin and mother; Cancer in her paternal grandfather and paternal grandmother; Colon cancer (age of onset: 31) in her mother; Heart disease in her paternal grandmother; Heart disease (age of onset: 77) in her father; Hypertension in her paternal grandmother; Stroke in her maternal grandfather and paternal grandmother.    ROS:  Please see the history of present illness.   Otherwise, review of systems are positive for none.   All other systems are reviewed and negative.    PHYSICAL EXAM: VS:  BP 128/88   Pulse 79   Temp 98.1 F (36.7 C) (Temporal)   Resp 15   Ht 5' 7.5" (1.715 m)   Wt 224 lb 6.4 oz (101.8 kg)   SpO2 97%   BMI 34.63 kg/m  , BMI Body mass index is 34.63 kg/m. GENERAL:  Well appearing HEENT:  Pupils equal round and reactive, fundi not visualized, oral mucosa unremarkable NECK:  No jugular venous distention, waveform within normal limits, carotid upstroke  brisk and symmetric, no bruits, no thyromegaly LYMPHATICS:  No cervical, inguinal adenopathy LUNGS:  Clear to auscultation bilaterally BACK:  No CVA tenderness CHEST:  Unremarkable HEART:  PMI not displaced or sustained,S1 and S2 within normal limits, no S3, no S4, no clicks, no rubs, no murmurs ABD:  Flat, positive bowel sounds normal in frequency in pitch, no bruits, no rebound,  no guarding, no midline pulsatile mass, no hepatomegaly, no splenomegaly EXT:  2 plus pulses throughout, mild edema, no cyanosis no clubbing SKIN:  No rashes no nodules NEURO:  Cranial nerves II through XII grossly intact, motor grossly intact throughout PSYCH:  Cognitively intact, oriented to person place and time    EKG:  EKG is ordered today. The ekg ordered today demonstrates sinus rhythm, rate 79, borderline left bundle branch block, no acute ST-T wave changes.   Recent Labs: 05/22/2019: ALT 13; TSH 3.67 07/22/2019: BUN 16; Creatinine, Ser 0.85; Hemoglobin 13.5; Platelets 349; Potassium 3.6; Sodium 141    Lipid Panel    Component Value Date/Time   CHOL 197 05/22/2019 1030   TRIG 95.0 05/22/2019 1030   HDL 61.10 05/22/2019 1030   CHOLHDL 3 05/22/2019 1030   VLDL 19.0 05/22/2019 1030   LDLCALC 117 (H) 05/22/2019 1030   LDLDIRECT 133.8 04/07/2013 1146      Wt Readings from Last 3 Encounters:  09/08/19 224 lb 6.4 oz (101.8 kg)  07/22/19 210 lb (95.3 kg)  06/09/19 206 lb (93.4 kg)      Other studies Reviewed: Additional studies/ records that were reviewed today include: ED records. Review of the above records demonstrates:  Please see elsewhere in the note.     ASSESSMENT AND PLAN:  CHEST PAIN:   The patient's chest pain was atypical.  Enzymes were negative.  CT was unremarkable.  She had a prior negative stress test and normal echo.  She is not having any further symptoms.  At this point she will let me know if she has any increasing or further symptoms at which point I might need to  follow-up.  However, in the absence of further symptoms and with the negative enzymes after sustained pain and minimal risk factors I do not feel that further testing is indicated.  NODULES: I will order CT to follow this up in 6 months.  HTN: Blood pressure is controlled.  No change in therapy.    Current medicines are reviewed at length with the patient today.  The patient does not have concerns regarding medicines.  The following changes have been made:  no change  Labs/ tests ordered today include:   Orders Placed This Encounter  Procedures  . CT Chest Wo Contrast  . EKG 12-Lead     Disposition:   FU with me in six months    Signed, Minus Breeding, MD  09/08/2019 5:31 PM    [facility]

## 2019-09-08 ENCOUNTER — Encounter: Payer: Self-pay | Admitting: Cardiology

## 2019-09-08 ENCOUNTER — Ambulatory Visit: Payer: BC Managed Care – PPO | Admitting: Cardiology

## 2019-09-08 ENCOUNTER — Other Ambulatory Visit: Payer: Self-pay

## 2019-09-08 VITALS — BP 128/88 | HR 79 | Temp 98.1°F | Resp 15 | Ht 67.5 in | Wt 224.4 lb

## 2019-09-08 DIAGNOSIS — R9389 Abnormal findings on diagnostic imaging of other specified body structures: Secondary | ICD-10-CM

## 2019-09-08 DIAGNOSIS — I10 Essential (primary) hypertension: Secondary | ICD-10-CM | POA: Diagnosis not present

## 2019-09-08 DIAGNOSIS — R072 Precordial pain: Secondary | ICD-10-CM

## 2019-09-08 NOTE — Patient Instructions (Addendum)
Medication Instructions:  No changes *If you need a refill on your cardiac medications before your next appointment, please call your pharmacy*  Lab Work: None needed this visit If you have labs (blood work) drawn today and your tests are completely normal, you will receive your results only by: Marland Kitchen MyChart Message (if you have MyChart) OR . A paper copy in the mail If you have any lab test that is abnormal or we need to change your treatment, we will call you to review the results.  Testing/Procedures: Chest CT without contrast in 6 months  Follow-Up: At [facility], you and your health needs are our priority.  As part of our continuing mission to provide you with exceptional heart care, we have created designated Provider Care Teams.  These Care Teams include your primary Cardiologist (physician) and Advanced Practice Providers (APPs -  Physician Assistants and Nurse Practitioners) who all work together to provide you with the care you need, when you need it.    Your next appointment:   6 month(s) after chest CT You will receive a reminder letter in the mail two months in advance. If you don't receive a letter, please call our office to schedule the follow-up appointment.  The format for your next appointment:   In Person  Provider:   Minus Breeding, MD

## 2019-11-03 DIAGNOSIS — S335XXA Sprain of ligaments of lumbar spine, initial encounter: Secondary | ICD-10-CM | POA: Diagnosis not present

## 2019-12-07 ENCOUNTER — Telehealth: Payer: Self-pay | Admitting: Internal Medicine

## 2019-12-07 ENCOUNTER — Other Ambulatory Visit: Payer: Self-pay

## 2019-12-07 MED ORDER — MECLIZINE HCL 25 MG PO TABS: ORAL_TABLET | ORAL | 0 refills | Status: DC

## 2019-12-07 NOTE — Telephone Encounter (Signed)
Medication has been sent to the requested pharmacy.

## 2019-12-07 NOTE — Telephone Encounter (Signed)
Pt call and want a refill on meclizine (ANTIVERT) 25 MG tablet call in to  [id number] - [place], [place] - [address]. Phone:  [phone]  Fax:  [phone]

## 2019-12-07 NOTE — Telephone Encounter (Signed)
Pt call and want a refill on

## 2019-12-29 ENCOUNTER — Encounter: Payer: Self-pay | Admitting: Obstetrics and Gynecology

## 2019-12-29 DIAGNOSIS — Z1231 Encounter for screening mammogram for malignant neoplasm of breast: Secondary | ICD-10-CM | POA: Diagnosis not present

## 2020-01-12 ENCOUNTER — Encounter: Payer: Self-pay | Admitting: Obstetrics and Gynecology

## 2020-01-12 DIAGNOSIS — Z9189 Other specified personal risk factors, not elsewhere classified: Secondary | ICD-10-CM

## 2020-01-16 NOTE — Telephone Encounter (Signed)
I would have to defer to the [facility] physicians as to what criteria, information, and risk models they are using to calculate the breast cancer risk. A calculated risk of 20% is considered 'high risk' Also could refer for genetic counseling and/or breast specialist for more detailed information.

## 2020-01-18 NOTE — Telephone Encounter (Signed)
Yes. [name] over at [facility] has a Genetic Counseling office. Laura Smith can do a referral for her and they will call her to schedule an appointment.

## 2020-01-18 NOTE — Telephone Encounter (Signed)
Do we have genetic counseling referral that we could do for this patient in regard to reviewing her breast cancer risk?

## 2020-01-18 NOTE — Telephone Encounter (Signed)
Hi, can we please refer the patient to genetic counseling? Thanks

## 2020-01-26 ENCOUNTER — Telehealth: Payer: Self-pay | Admitting: Genetic Counselor

## 2020-01-26 NOTE — Telephone Encounter (Signed)
Received a genetic counseling referral from Dr. Delilah Shan for high risk for breast cancer. A genetic counseling appt has been scheduled for Mrs. Laura Smith to see Santiago Glad on 8/23 at 2pm. Pt aware to arrive 15 minutes early.

## 2020-02-02 NOTE — Telephone Encounter (Signed)
Patient has appt for Genetic Counseling 02/12/20 at 2:00pm.

## 2020-02-12 ENCOUNTER — Inpatient Hospital Stay: Payer: BC Managed Care – PPO

## 2020-02-12 ENCOUNTER — Other Ambulatory Visit: Payer: Self-pay

## 2020-02-12 ENCOUNTER — Inpatient Hospital Stay: Payer: BC Managed Care – PPO | Attending: Genetic Counselor | Admitting: Genetic Counselor

## 2020-02-12 ENCOUNTER — Other Ambulatory Visit: Payer: Self-pay | Admitting: Genetic Counselor

## 2020-02-12 DIAGNOSIS — Z803 Family history of malignant neoplasm of breast: Secondary | ICD-10-CM

## 2020-02-12 DIAGNOSIS — Z800 Family history of malignant neoplasm of digestive organs: Secondary | ICD-10-CM | POA: Diagnosis not present

## 2020-02-12 DIAGNOSIS — Z8042 Family history of malignant neoplasm of prostate: Secondary | ICD-10-CM

## 2020-02-13 ENCOUNTER — Encounter: Payer: Self-pay | Admitting: Genetic Counselor

## 2020-02-13 DIAGNOSIS — Z803 Family history of malignant neoplasm of breast: Secondary | ICD-10-CM | POA: Insufficient documentation

## 2020-02-13 DIAGNOSIS — Z8042 Family history of malignant neoplasm of prostate: Secondary | ICD-10-CM | POA: Insufficient documentation

## 2020-02-13 DIAGNOSIS — Z800 Family history of malignant neoplasm of digestive organs: Secondary | ICD-10-CM | POA: Insufficient documentation

## 2020-02-13 LAB — GENETIC SCREENING ORDER

## 2020-02-13 NOTE — Progress Notes (Addendum)
REFERRING PROVIDER: Lorelle Gibbs, MD [facility] [address] [place],  [place] [address]  PRIMARY PROVIDER:  Burnis Medin, MD  PRIMARY REASON FOR VISIT:  1. Family history of prostate cancer   2. Family history of breast cancer   3. Family history of colon cancer      HISTORY OF PRESENT ILLNESS:   Ms. Alkins, a 59 y.o. female, was seen for a [place] cancer genetics consultation at the request of Dr. Isaiah Blakes and [facility] due to a family history of breast, prostate and colon cancer.  Ms. Bristow presents to clinic today to discuss the possibility of a hereditary predisposition to cancer, genetic testing, and to further clarify her future cancer risks, as well as potential cancer risks for family members.   Ms. Batty is a 59 y.o. female with no personal history of cancer.    CANCER HISTORY:  Oncology History   No history exists.     RISK FACTORS:  Menarche was at age 26.  First live birth at age 25.   Ovaries intact: yes.  Hysterectomy: no.  Menopausal status: postmenopausal.  HRT use: 0 years. Colonoscopy: yes; 2 polyps. Mammogram within the last year: yes. Number of breast biopsies: 0. Up to date with pelvic exams: yes. Any excessive radiation exposure in the past: no  Past Medical History:  Diagnosis Date  . Allergy   . Breast cyst   . Endometriosis    surgery laser 2001  . Family history of breast cancer   . Family history of colon cancer   . Family history of prostate cancer   . Hemorrhoids   . Hx of basal cell carcinoma   . Hypertension   . Migraine   . Urinary tract infection     Past Surgical History:  Procedure Laterality Date  . [place]  . HYSTEROSCOPY  08/1999   AT TIME OF LAPAROSCOPY  . PELVIC LAPAROSCOPY  08/1999   AND HYSTEROSCOPY--ENDOMETRIOSIS    Social History   Socioeconomic History  . Marital status:  Married    Spouse name: Not on file  . Number of children: Not on file  . Years of education: Not on file  . Highest education level: Not on file  Occupational History  . Not on file  Tobacco Use  . Smoking status: Never Smoker  . Smokeless tobacco: Never Used  Vaping Use  . Vaping Use: Never used  Substance and Sexual Activity  . Alcohol use: No    Alcohol/week: 0.0 standard drinks  . Drug use: No  . Sexual activity: Yes    Birth control/protection: Post-menopausal    Comment: Pt declined sexual hx questions  Other Topics Concern  . Not on file  Social History Narrative   h h of 2 Pet cat   Daughter /  Married    g1 p1   Works at [employer]:   . Difficulty of Paying Living Expenses: Not on file  Food Insecurity:   . Worried About Charity fundraiser in the Last Year: Not on file  . Ran Out of Food in the Last Year: Not on file  Transportation Needs:   . Lack of Transportation (Medical): Not on file  . Lack of Transportation (Non-Medical): Not on file  Physical Activity:   . Days of  Exercise per Week: Not on file  . Minutes of Exercise per Session: Not on file  Stress:   . Feeling of Stress : Not on file  Social Connections:   . Frequency of Communication with Friends and Family: Not on file  . Frequency of Social Gatherings with Friends and Family: Not on file  . Attends Religious Services: Not on file  . Active Member of Clubs or Organizations: Not on file  . Attends Archivist Meetings: Not on file  . Marital Status: Not on file     FAMILY HISTORY:  We obtained a detailed, 4-generation family history.  Significant diagnoses are listed below: Family History  Problem Relation Age of Onset  . Breast cancer Mother        Age 23  . Colon cancer Mother 65  . Breast cancer Cousin 60       paternal first cousin  . Heart disease Father 85       Heart attack  . Hypertension Paternal  Grandmother   . Heart disease Paternal Grandmother   . Stroke Paternal Grandmother   . Cancer Paternal Grandmother        Lung  . Cancer Paternal Grandfather        Prostate  . Stroke Maternal Grandfather   . COPD Maternal Uncle   . Liver cancer Paternal Uncle   . Congestive Heart Failure Maternal Grandmother   . Hypertension Paternal Aunt     The patient has one daughter who is cancer free.  She has one brother who is not reported to have cancer.  Her father is deceased and her mother is living.  The patient's mother had breast cancer at 43 and colon cancer at 62.  She had one brother who died of COPD at 63.  The maternal grandparents are deceased from non-cancer related issues.  The patient's father died of a heart attack at 70.  He had one brother and four sisters.  One sister died at 41 from a heart defect and another died at 12 from complications of HTN.  This sister had a daughter with breast cancer at 56.  The paternal grandparents are deceased.  The grandfather had prostate cancer and the grandmother had lung cancer.  Ms. Kirtley is unaware of previous family history of genetic testing for hereditary cancer risks. Patient's maternal ancestors are of Caucasian descent, and paternal ancestors are of English descent. There is no reported Ashkenazi Jewish ancestry. There is no known consanguinity.   GENETIC COUNSELING ASSESSMENT: Ms. Trew is a 59 y.o. female with a family history of cancer which is somewhat suggestive of a hereditary cancer syndrome and predisposition to cancer given the combination of prostate and breast cancer and the relatively young age of onset of the patient's cousin. We, therefore, discussed and recommended the following at today's visit.   DISCUSSION: We discussed that 5 - 10% of breast cancer is hereditary, with most cases associated with BRCA mutations.  There are other genes that can be associated with hereditary breast cancer syndromes.  These include ATM,  CHEK2 and PALB2.  We discussed that testing is beneficial for several reasons including knowing how to follow individuals after completing their treatment, identifying whether potential treatment options such as PARP inhibitors would be beneficial, and understand if other family members could be at risk for cancer and allow them to undergo genetic testing.   We reviewed the characteristics, features and inheritance patterns of hereditary cancer syndromes. We also discussed genetic testing,  including the appropriate family members to test, the process of testing, insurance coverage and turn-around-time for results. We discussed the implications of a negative, positive and/or variant of uncertain significant result. In order to get genetic test results in a timely manner so that Ms. Klausner can use these genetic test results for surgical decisions, we recommended Ms. Teng pursue genetic testing for the BRCA1 and BRCA2 genes. Once complete, we recommend Ms. Pasquini pursue reflex genetic testing to the common hereditary cancer gene panel. The Common Hereditary Gene Panel offered by [facility] includes sequencing and/or deletion duplication testing of the following 48 genes: APC, ATM, AXIN2, BARD1, BMPR1A, BRCA1, BRCA2, BRIP1, CDH1, CDK4, CDKN2A (p14ARF), CDKN2A (p16INK4a), CHEK2, CTNNA1, DICER1, EPCAM (Deletion/duplication testing only), GREM1 (promoter region deletion/duplication testing only), KIT, MEN1, MLH1, MSH2, MSH3, MSH6, MUTYH, NBN, NF1, NHTL1, PALB2, PDGFRA, PMS2, POLD1, POLE, PTEN, RAD50, RAD51C, RAD51D, RNF43, SDHB, SDHC, SDHD, SMAD4, SMARCA4. STK11, TP53, TSC1, TSC2, and VHL.  The following genes were evaluated for sequence changes only: SDHA and HOXB13 c.251G>A variant only.   Based on Ms. Corbridge's family history of cancer, she meets medical criteria for genetic testing. Despite that she meets criteria, she may still have an out of pocket cost.   Based on the patient's family history, a statistical  model (Tyrer [place]) was used to estimate her risk of developing breast cancer. This estimates her lifetime risk of developing breast cancer to be approximately 22.7%. This estimation does not consider any genetic testing results.  The patient's lifetime breast cancer risk is a preliminary estimate based on available information using one of several models endorsed by the [place] (ACS). The ACS recommends consideration of breast MRI screening as an adjunct to mammography for patients at high risk (defined as 20% or greater lifetime risk).   Ms. Matsushita has been determined to be at high risk for breast cancer.  Therefore, we recommend that annual screening with mammography and breast MRI be performed.  We discussed that Ms. Koerner should discuss her individual situation with her referring physician and determine a breast cancer screening plan with which they are both comfortable.      PLAN: After considering the risks, benefits, and limitations, Ms. Hirota provided informed consent to pursue genetic testing and the blood sample was sent to [facility] for analysis of the common hereditary cancer panel. Results should be available within approximately 2-3 weeks' time, at which point they will be disclosed by telephone to Ms. Barrie, as will any additional recommendations warranted by these results. Ms. Goodnough will receive a summary of her genetic counseling visit and a copy of her results once available. This information will also be available in Epic.   Lastly, we encouraged Ms. Beckstrand to remain in contact with cancer genetics annually so that we can continuously update the family history and inform her of any changes in cancer genetics and testing that may be of benefit for this family.   Ms. Beretta questions were answered to her satisfaction today. Our contact information was provided should additional questions or concerns arise. Thank you for the referral and allowing [place]  to share in the care of your patient.   Trong Gosling P. Florene Glen, [place], [facility] Licensed, Insurance risk surveyor [email or web address] phone: [phone]  The patient was seen for a total of 45 minutes in face-to-face genetic counseling.  This patient was discussed with Drs. Magrinat, Lindi Adie and/or Burr Medico who agrees with the above.    _______________________________________________________________________ For Office Staff:  Number of people involved in  session: 1 Was an Intern/ student involved with case: yes; Layne Benton

## 2020-02-16 ENCOUNTER — Ambulatory Visit (INDEPENDENT_AMBULATORY_CARE_PROVIDER_SITE_OTHER)
Admission: RE | Admit: 2020-02-16 | Discharge: 2020-02-16 | Disposition: A | Discharge status: Home / Self Care | Payer: BC Managed Care – PPO | Source: Ambulatory Visit | Attending: Cardiology | Admitting: Cardiology

## 2020-02-16 ENCOUNTER — Other Ambulatory Visit: Payer: Self-pay

## 2020-02-16 DIAGNOSIS — R911 Solitary pulmonary nodule: Secondary | ICD-10-CM | POA: Diagnosis not present

## 2020-02-16 DIAGNOSIS — R9389 Abnormal findings on diagnostic imaging of other specified body structures: Secondary | ICD-10-CM | POA: Diagnosis not present

## 2020-02-16 DIAGNOSIS — R918 Other nonspecific abnormal finding of lung field: Secondary | ICD-10-CM | POA: Diagnosis not present

## 2020-02-16 DIAGNOSIS — Z85828 Personal history of other malignant neoplasm of skin: Secondary | ICD-10-CM | POA: Diagnosis not present

## 2020-02-21 ENCOUNTER — Telehealth: Payer: Self-pay | Admitting: *Deleted

## 2020-02-21 DIAGNOSIS — R918 Other nonspecific abnormal finding of lung field: Secondary | ICD-10-CM

## 2020-02-21 NOTE — Telephone Encounter (Signed)
-----   Message from Minus Breeding, MD sent at 02/20/2020  6:00 PM EDT ----- The right upper lobe nodule is stable and would not require further imaging for another couple of years.  There are some non specific findings in the left upper lobe as well and this was mentioned before.  They suggest follow up of this in 12 months so to be safe we should repeat a non contrasted CT of the chest in 12 months.  Call Ms. Vickrey with the results and send results to Panosh, [name], MD

## 2020-02-21 NOTE — Telephone Encounter (Signed)
Released in my chart with Dr Hochrein's comments attached and sent to PCP  Future orders placed

## 2020-02-22 ENCOUNTER — Encounter: Payer: Self-pay | Admitting: Genetic Counselor

## 2020-02-22 ENCOUNTER — Telehealth: Payer: Self-pay | Admitting: Genetic Counselor

## 2020-02-22 DIAGNOSIS — Z1379 Encounter for other screening for genetic and chromosomal anomalies: Secondary | ICD-10-CM | POA: Insufficient documentation

## 2020-02-22 NOTE — Telephone Encounter (Signed)
LM on VM that results are back and to please call. 

## 2020-02-23 ENCOUNTER — Ambulatory Visit: Payer: Self-pay | Admitting: Genetic Counselor

## 2020-02-23 DIAGNOSIS — Z1379 Encounter for other screening for genetic and chromosomal anomalies: Secondary | ICD-10-CM

## 2020-02-23 NOTE — Progress Notes (Signed)
HPI:  Ms. Shamoon was previously seen in the [facility] clinic due to a family history of cancer and concerns regarding a hereditary predisposition to cancer. Please refer to our prior cancer genetics clinic note for more information regarding our discussion, assessment and recommendations, at the time. Ms. Stierwalt recent genetic test results were disclosed to her, as were recommendations warranted by these results. These results and recommendations are discussed in more detail below.  CANCER HISTORY:  Oncology History   No history exists.    FAMILY HISTORY:  We obtained a detailed, 4-generation family history.  Significant diagnoses are listed below: Family History  Problem Relation Age of Onset  . Breast cancer Mother        Age 19  . Colon cancer Mother 92  . Breast cancer Cousin 82       paternal first cousin  . Heart disease Father 59       Heart attack  . Hypertension Paternal Grandmother   . Heart disease Paternal Grandmother   . Stroke Paternal Grandmother   . Cancer Paternal Grandmother        Lung  . Cancer Paternal Grandfather        Prostate  . Stroke Maternal Grandfather   . COPD Maternal Uncle   . Liver cancer Paternal Uncle   . Congestive Heart Failure Maternal Grandmother   . Hypertension Paternal Aunt     The patient has one daughter who is cancer free.  She has one brother who is not reported to have cancer.  Her father is deceased and her mother is living.  The patient's mother had breast cancer at 72 and colon cancer at 41.  She had one brother who died of COPD at 34.  The maternal grandparents are deceased from non-cancer related issues.  The patient's father died of a heart attack at 74.  He had one brother and four sisters.  One sister died at 23 from a heart defect and another died at 60 from complications of HTN.  This sister had a daughter with breast cancer at 37.  The paternal grandparents are deceased.  The grandfather had prostate  cancer and the grandmother had lung cancer.  Ms. Robley is unaware of previous family history of genetic testing for hereditary cancer risks. Patient's maternal ancestors are of Caucasian descent, and paternal ancestors are of English descent. There is no reported Ashkenazi Jewish ancestry. There is no known consanguinity.    GENETIC TEST RESULTS: Genetic testing reported out on February 21, 2020 through the Common Hereditary cancer panel found no pathogenic mutations. The Common Hereditary Gene Panel offered by [facility] includes sequencing and/or deletion duplication testing of the following 48 genes: APC, ATM, AXIN2, BARD1, BMPR1A, BRCA1, BRCA2, BRIP1, CDH1, CDK4, CDKN2A (p14ARF), CDKN2A (p16INK4a), CHEK2, CTNNA1, DICER1, EPCAM (Deletion/duplication testing only), GREM1 (promoter region deletion/duplication testing only), KIT, MEN1, MLH1, MSH2, MSH3, MSH6, MUTYH, NBN, NF1, NHTL1, PALB2, PDGFRA, PMS2, POLD1, POLE, PTEN, RAD50, RAD51C, RAD51D, RNF43, SDHB, SDHC, SDHD, SMAD4, SMARCA4. STK11, TP53, TSC1, TSC2, and VHL.  The following genes were evaluated for sequence changes only: SDHA and HOXB13 c.251G>A variant only. The test report has been scanned into EPIC and is located under the Molecular Pathology section of the Results Review tab.  A portion of the result report is included below for reference.     We discussed with Ms. Gravois that because current genetic testing is not perfect, it is possible there may be a gene mutation in one of  these genes that current testing cannot detect, but that chance is small.  We also discussed, that there could be another gene that has not yet been discovered, or that we have not yet tested, that is responsible for the cancer diagnoses in the family. It is also possible there is a hereditary cause for the cancer in the family that Ms. Lanes did not inherit and therefore was not identified in her testing.  Therefore, it is important to remain in touch with cancer  genetics in the future so that we can continue to offer Ms. Tsuchiya the most up to date genetic testing.   ADDITIONAL GENETIC TESTING: We discussed with Ms. Fatzinger that there are other genes that are associated with increased cancer risk that can be analyzed. Should Ms. Rodger wish to pursue additional genetic testing, we are happy to discuss and coordinate this testing, at any time.    CANCER SCREENING RECOMMENDATIONS: Ms. Wieseler test result is considered negative (normal).  This means that we have not identified a hereditary cause for her family history of cancer at this time. Most cancers happen by chance and this negative test suggests that her cancer may fall into this category.    While reassuring, this does not definitively rule out a hereditary predisposition to cancer. It is still possible that there could be genetic mutations that are undetectable by current technology. There could be genetic mutations in genes that have not been tested or identified to increase cancer risk.  Therefore, it is recommended she continue to follow the cancer management and screening guidelines provided by her oncology and primary healthcare provider.   An individual's cancer risk and medical management are not determined by genetic test results alone. Overall cancer risk assessment incorporates additional factors, including personal medical history, family history, and any available genetic information that may result in a personalized plan for cancer prevention and surveillance  Based on the patient's family history, a statistical model (Tyrer Cusik) was used to estimate her risk of developing breast cancer. This estimates her lifetime risk of developing breast cancer to be approximately 22.7%. This estimation does not consider any genetic testing results.  The patient's lifetime breast cancer risk is a preliminary estimate based on available information using one of several models endorsed by the [employer] (ACS). The ACS recommends consideration of breast MRI screening as an adjunct to mammography for patients at high risk (defined as 20% or greater lifetime risk).   Ms. Krontz has been determined to be at high risk for breast cancer. Therefore, we recommend that annual screening with mammography and breast MRI be performed.  We discussed that Ms. Bickert could be referred to the high risk breast clinic to be followed for her increased risk for breast cancer.  She has agreed to this referral.     RECOMMENDATIONS FOR FAMILY MEMBERS:  Individuals in this family might be at some increased risk of developing cancer, over the general population risk, simply due to the family history of cancer.  We recommended women in this family have a yearly mammogram beginning at age 107, or 33 years younger than the earliest onset of cancer, an annual clinical breast exam, and perform monthly breast self-exams. Women in this family should also have a gynecological exam as recommended by their primary provider. All family members should be referred for colonoscopy starting at age 76.  FOLLOW-UP: Lastly, we discussed with Ms. Klimas that cancer genetics is a rapidly advancing field and it is possible  that new genetic tests will be appropriate for her and/or her family members in the future. We encouraged her to remain in contact with cancer genetics on an annual basis so we can update her personal and family histories and let her know of advances in cancer genetics that may benefit this family.   Our contact number was provided. Ms. Khalid questions were answered to her satisfaction, and she knows she is welcome to call us at anytime with additional questions or concerns.   Roma Kayser, [place], [facility] Licensed, Certified Genetic Counselor [email or web address]

## 2020-02-23 NOTE — Telephone Encounter (Signed)
Revealed negative genetic testing.  Discussed that we do not know why there is cancer in the family. It could be due to a different gene that we are not testing, or maybe our current technology may not be able to pick something up.  It will be important for her to keep in contact with genetics to keep up with whether additional testing may be needed.  Patient's TC indicates increased risk for breast cancer.  Will refer to high risk clinic.

## 2020-02-27 ENCOUNTER — Telehealth: Payer: Self-pay | Admitting: Genetic Counselor

## 2020-02-27 NOTE — Telephone Encounter (Signed)
Received a referral from genetics to schedule Laura Smith to be seen in the high risk breast clinic. I lft the pt a vm to return my call.

## 2020-03-01 ENCOUNTER — Telehealth: Payer: Self-pay | Admitting: Hematology

## 2020-03-01 NOTE — Telephone Encounter (Signed)
Laura Smith has been cld and scheduled to see Dr. Burr Medico in 07/05/20 for the high risk breast clinic. Per pt she needed a Friday appt due to her work schedule.

## 2020-03-05 DIAGNOSIS — M545 Low back pain: Secondary | ICD-10-CM | POA: Diagnosis not present

## 2020-03-05 DIAGNOSIS — M25552 Pain in left hip: Secondary | ICD-10-CM | POA: Diagnosis not present

## 2020-03-06 ENCOUNTER — Other Ambulatory Visit: Payer: Self-pay | Admitting: Internal Medicine

## 2020-03-18 ENCOUNTER — Telehealth: Payer: Self-pay | Admitting: Internal Medicine

## 2020-03-18 ENCOUNTER — Other Ambulatory Visit: Payer: Self-pay

## 2020-03-18 MED ORDER — VERAPAMIL HCL ER 180 MG PO TBCR: 180.0000 mg | EXTENDED_RELEASE_TABLET | Freq: Every day | ORAL | 0 refills | Status: DC

## 2020-03-18 NOTE — Telephone Encounter (Signed)
I have sent in 5 pills to the pharmacy requested for patient.

## 2020-03-18 NOTE — Telephone Encounter (Signed)
Pt call and stated she need a refill on verapamil (CALAN-SR) 180 MG CR tablet she stated that she need only a few day until her mail order come in sent to  [id number] - [address], [place]. Phone:  [phone]  Fax:  [phone]

## 2020-03-28 NOTE — Progress Notes (Signed)
Cardiology Office Note   Date:  03/29/2020   ID:  Laura Smith, DOB [birth year], MRN [id number]  PCP:  Burnis Medin, MD  Cardiologist:   No primary care provider on file.   Chief Complaint  Patient presents with  . Chest Pain      History of Present Illness: Laura Smith is a 59 y.o. female who presents for evaluation of chest discomfort.  She was in the hospital in 2018.    There was chest discomfort discussed.  Stress perfusion study in 2018 demonstrated no ischemia.  She had this when she was in the hospital with chest pain.    She had an echo in 2019 with normal EF.   She did rule out for myocardial infarction.  Her EKG demonstrates an interventricular conduction delay this borderline left bundle branch block that is new compared to previous.  She had CT on 07/22/19.  She had a 5 mm nodule in the right upper lobe but no evidence of aortic dissection or aneurysm.  She had a follow up CT in August with results listed below.     Since I last saw her she has had no new cardiovascular complaints.  She denies any palpitations, presyncope or syncope.  Has had no PND or orthopnea.  She has had no weight gain or edema.  She has had none of the chest pain that prompted either her 2018 or January visit.  Past Medical History:  Diagnosis Date  . Allergy   . Breast cyst   . Endometriosis    surgery laser 2001  . Family history of breast cancer   . Family history of colon cancer   . Family history of prostate cancer   . Hemorrhoids   . Hx of basal cell carcinoma   . Hypertension   . Migraine   . Urinary tract infection     Past Surgical History:  Procedure Laterality Date  . [place]  . HYSTEROSCOPY  08/1999   AT TIME OF LAPAROSCOPY  . PELVIC LAPAROSCOPY  08/1999   AND HYSTEROSCOPY--ENDOMETRIOSIS     Current Outpatient Medications  Medication Sig Dispense Refill  . Ascorbic Acid (VITAMIN C) 500  MG CAPS Take 1,000 mg by mouth every morning.     Marland Kitchen b complex vitamins tablet Take 2 tablets by mouth daily.     Marland Kitchen CALCIUM-MAGNESIUM PO Take 3 tablets by mouth daily.     . cetirizine (ZYRTEC) 10 MG tablet Take 10 mg by mouth daily.    . cyclobenzaprine (FLEXERIL) 10 MG tablet Take 10 mg by mouth as needed for muscle spasms.     . hydrochlorothiazide (HYDRODIURIL) 25 MG tablet TAKE 1 TABLET BY MOUTH DAILY 90 tablet 0  . HYDROcodone-acetaminophen (NORCO) 7.5-325 MG per tablet Take 1-2 tablets by mouth every 4 (four) hours as needed for pain (as needed for migraine). Reported on 09/23/2015    . HYDROmorphone (DILAUDID) 4 MG tablet Take 4-8 mg by mouth every 4 (four) hours as needed for pain (rescure for migraine). Reported on 09/23/2015    . meclizine (ANTIVERT) 25 MG tablet TAKE 2 TABLET (50 MG TOTAL) BY MOUTH 2 (TWO) TIMES DAILY AS NEEDED (FOR TRAVEL). 30 tablet 0  . Multiple Vitamins-Iron (MULTIVITAMIN/IRON) TABS Take 1 tablet by mouth daily.     Marland Kitchen nystatin (NYSTATIN) powder Apply topically 4 (four) times daily. As needed for rash  30 g 2  . sulindac (CLINORIL) 200 MG tablet Take 1 tablet (200 mg total) by mouth 2 (two) times daily as needed (headache). Reported on 09/23/2015 30 tablet 3  . SUMAtriptan (IMITREX) 6 MG/0.5ML SOLN injection Inject 6 mg into the skin every 2 (two) hours as needed for migraine. Reported on 09/23/2015    . traMADol (ULTRAM) 50 MG tablet Take 50 mg by mouth as needed for severe pain.     Marland Kitchen venlafaxine XR (EFFEXOR XR) 37.5 MG 24 hr capsule Take 1 capsule (37.5 mg total) by mouth daily with breakfast. Increase to 75 mg in am( 2 capsules) after 1 week. 60 capsule 1  . verapamil (CALAN-SR) 180 MG CR tablet Take 1 tablet (180 mg total) by mouth at bedtime. 5 tablet 0  . zolmitriptan (ZOMIG ZMT) 5 MG disintegrating tablet Take 1 tablet (5 mg total) by mouth as needed for migraine. May repeat in 2 hours 10 tablet 2   Current Facility-Administered Medications  Medication Dose Route  Frequency Provider Last Rate Last Admin  . methylPREDNISolone acetate (DEPO-MEDROL) injection 120 mg  120 mg Intramuscular Once Panosh, [name], MD        Allergies:   Etodolac, Latex, Other, and Adhesive [tape]    ROS:  Please see the history of present illness.   Otherwise, review of systems are positive for hip pain.   All other systems are reviewed and negative.    PHYSICAL EXAM: VS:  BP 125/64   Pulse 75   Temp (!) 97.1 F (36.2 C)   Ht 5' 7.5" (1.715 m)   Wt 217 lb 3.2 oz (98.5 kg)   SpO2 97%   BMI 33.52 kg/m  , BMI Body mass index is 33.52 kg/m. GENERAL:  Well appearing NECK:  No jugular venous distention, waveform within normal limits, carotid upstroke brisk and symmetric, no bruits, no thyromegaly LUNGS:  Clear to auscultation bilaterally CHEST:  Unremarkable HEART:  PMI not displaced or sustained,S1 and S2 within normal limits, no S3, no S4, no clicks, no rubs, no murmurs ABD:  Flat, positive bowel sounds normal in frequency in pitch, no bruits, no rebound, no guarding, no midline pulsatile mass, no hepatomegaly, no splenomegaly EXT:  2 plus pulses throughout, no edema, no cyanosis no clubbing    EKG:  EKG is not ordered today.   Recent Labs: 05/22/2019: ALT 13; TSH 3.67 07/22/2019: BUN 16; Creatinine, Ser 0.85; Hemoglobin 13.5; Platelets 349; Potassium 3.6; Sodium 141    Lipid Panel    Component Value Date/Time   CHOL 197 05/22/2019 1030   TRIG 95.0 05/22/2019 1030   HDL 61.10 05/22/2019 1030   CHOLHDL 3 05/22/2019 1030   VLDL 19.0 05/22/2019 1030   LDLCALC 117 (H) 05/22/2019 1030   LDLDIRECT 133.8 04/07/2013 1146      Wt Readings from Last 3 Encounters:  03/29/20 217 lb 3.2 oz (98.5 kg)  09/08/19 224 lb 6.4 oz (101.8 kg)  07/22/19 210 lb (95.3 kg)      Other studies Reviewed: Additional studies/ records that were reviewed today include: CT. Review of the above records demonstrates:  Please see elsewhere in the note.     ASSESSMENT AND  PLAN:  CHEST PAIN:   Patient has had no further chest discomfort.  No further risk testing is indicated.  NODULES:  The right upper lobe nodule is stable and would not require further imaging for another couple of years. There are some non specific findings in the left upper lobe  as well and this was mentioned before. They suggest follow up of this in 12 months so to be safe we should repeat a non contrasted CT of the chest in august 2022.     HTN: Blood pressure is controlled.  No change in therapy.   COVID EDUCATION: She has been vaccinated.  Wants me to talk to her husband who is also my patient.  He has not been vaccinated.  We talked about the vaccine on his behalf.   Current medicines are reviewed at length with the patient today.  The patient does not have concerns regarding medicines.  The following changes have been made:  no change  Labs/ tests ordered today include: CT No orders of the defined types were placed in this encounter.    Disposition:   FU with me as needed.      Signed, Minus Breeding, MD  03/29/2020 3:19 PM    [facility]

## 2020-03-29 ENCOUNTER — Encounter: Payer: Self-pay | Admitting: Cardiology

## 2020-03-29 ENCOUNTER — Other Ambulatory Visit: Payer: Self-pay

## 2020-03-29 ENCOUNTER — Ambulatory Visit: Payer: BC Managed Care – PPO | Admitting: Cardiology

## 2020-03-29 VITALS — BP 125/64 | HR 75 | Temp 97.1°F | Ht 67.5 in | Wt 217.2 lb

## 2020-03-29 DIAGNOSIS — Z7189 Other specified counseling: Secondary | ICD-10-CM | POA: Diagnosis not present

## 2020-03-29 DIAGNOSIS — I10 Essential (primary) hypertension: Secondary | ICD-10-CM

## 2020-03-29 DIAGNOSIS — R072 Precordial pain: Secondary | ICD-10-CM

## 2020-03-29 NOTE — Patient Instructions (Signed)
Medication Instructions:  Your physician recommends that you continue on your current medications as directed. Please refer to the Current Medication list given to you today.  *If you need a refill on your cardiac medications before your next appointment, please call your pharmacy*   Testing/Procedures: Chest CT August/September 2022   Follow-Up: At [facility], you and your health needs are our priority.  As part of our continuing mission to provide you with exceptional heart care, we have created designated Provider Care Teams.  These Care Teams include your primary Cardiologist (physician) and Advanced Practice Providers (APPs -  Physician Assistants and Nurse Practitioners) who all work together to provide you with the care you need, when you need it.  We recommend signing up for the patient portal called "MyChart".  Sign up information is provided on this After Visit Summary.  MyChart is used to connect with patients for Virtual Visits (Telemedicine).  Patients are able to view lab/test results, encounter notes, upcoming appointments, etc.  Non-urgent messages can be sent to your provider as well.   To learn more about what you can do with MyChart, go to [email or web address].    Your next appointment:   AS NEEDED with Dr. Percival Spanish

## 2020-04-12 DIAGNOSIS — M5441 Lumbago with sciatica, right side: Secondary | ICD-10-CM | POA: Diagnosis not present

## 2020-04-12 DIAGNOSIS — M25551 Pain in right hip: Secondary | ICD-10-CM | POA: Diagnosis not present

## 2020-04-16 ENCOUNTER — Other Ambulatory Visit: Payer: Self-pay | Admitting: Neurosurgery

## 2020-04-16 DIAGNOSIS — M5441 Lumbago with sciatica, right side: Secondary | ICD-10-CM

## 2020-05-02 ENCOUNTER — Other Ambulatory Visit: Payer: Self-pay

## 2020-05-02 ENCOUNTER — Ambulatory Visit
Admission: RE | Admit: 2020-05-02 | Discharge: 2020-05-02 | Disposition: A | Discharge status: Home / Self Care | Payer: BC Managed Care – PPO | Source: Ambulatory Visit | Attending: Neurosurgery | Admitting: Neurosurgery

## 2020-05-02 DIAGNOSIS — M5441 Lumbago with sciatica, right side: Secondary | ICD-10-CM

## 2020-05-02 DIAGNOSIS — M545 Low back pain, unspecified: Secondary | ICD-10-CM | POA: Diagnosis not present

## 2020-05-03 ENCOUNTER — Other Ambulatory Visit: Payer: BC Managed Care – PPO

## 2020-05-14 DIAGNOSIS — M5416 Radiculopathy, lumbar region: Secondary | ICD-10-CM | POA: Diagnosis not present

## 2020-05-14 DIAGNOSIS — M5126 Other intervertebral disc displacement, lumbar region: Secondary | ICD-10-CM | POA: Diagnosis not present

## 2020-05-22 NOTE — Progress Notes (Signed)
Chief Complaint  Patient presents with  . Annual Exam    HPI: Patient  Laura Smith  59 y.o. comes in today for Preventive Health Care visit \\ And med management. She has been on short-term disability because of back predicament since November.  Dr Arnoldo Morale.  He is to do surgery on Dec 16    For ruptured disc.  Of note she has not had her migraine headaches since she has been off of work probably is better hydrated.  She took the Zomig instead of the nasal spray and although it helped the headache she felt very bad side effect wise and does not really want to take it anymore. She has been given tramadol for pain says it does not really worked out well so will take Tylenol previously was on narcotics not anymore previously saw Dr. Trellis Moment it for headaches and had aquatics for rescue.   Health Maintenance  Topic Date Due  . Hepatitis C Screening  Never done  . COVID-19 Vaccine (1) Never done  . COLONOSCOPY  04/28/2019  . PAP SMEAR-Modifier  06/03/2021  . MAMMOGRAM  12/28/2021  . TETANUS/TDAP  04/29/2026  . INFLUENZA VACCINE  Completed  . HIV Screening  Completed   Health Maintenance Review LIFESTYLE:  Exercise:   Tobacco/ETS: Alcohol:   Sugar beverages: soda s 4-5 per week . Sleep: interrupted   Under   Care  radiculopathy . Drug use: no HH of  2ov 1  wORK   9 HOURS PER DAY  Pre   short-term disability nov 2021.  Dr. Phineas Real retired but she will have a new OB/GYN evaluation in January.  She sees dermatology yearly. Her chest pain was felt to be noncardiac. Addendum pulmonary Noller joules being followed. ROS: Except as per HPI sometimes her memory is off. GEN/ HEENT: No fever, significant weight changes sweats headaches vision problems hearing changes, CV/ PULM; No chest pain shortness of breath cough, syncope,edema  change in exercise tolerance. GI /GU: No adominal pain, vomiting, change in bowel habits. No blood in the stool. No significant GU symptoms. SKIN/HEME: ,no  acute skin rashes suspicious lesions or bleeding. No lymphadenopathy, nodules, masses.  NEURO/ PSYCH:  No neurologic signs such as weakness numbness. No depression anxiety. IMM/ Allergy: No unusual infections.  Allergy .   REST of 12 system review negative except as per HPI   Past Medical History:  Diagnosis Date  . Allergy   . Breast cyst   . Endometriosis    surgery laser 2001  . Family history of breast cancer   . Family history of colon cancer   . Family history of prostate cancer   . Hemorrhoids   . Hx of basal cell carcinoma   . Hypertension   . Migraine   . Urinary tract infection     Past Surgical History:  Procedure Laterality Date  . [place]  . HYSTEROSCOPY  08/1999   AT TIME OF LAPAROSCOPY  . PELVIC LAPAROSCOPY  08/1999   AND HYSTEROSCOPY--ENDOMETRIOSIS    Family History  Problem Relation Age of Onset  . Breast cancer Mother        Age 59  . Colon cancer Mother 10  . Breast cancer Cousin 56       paternal first cousin  . Heart disease Father 63       Heart attack  . Hypertension Paternal Grandmother   .  Heart disease Paternal Grandmother   . Stroke Paternal Grandmother   . Cancer Paternal Grandmother        Lung  . Cancer Paternal Grandfather        Prostate  . Stroke Maternal Grandfather   . COPD Maternal Uncle   . Liver cancer Paternal Uncle   . Congestive Heart Failure Maternal Grandmother   . Hypertension Paternal Aunt     Social History   Socioeconomic History  . Marital status: Married    Spouse name: Not on file  . Number of children: Not on file  . Years of education: Not on file  . Highest education level: Not on file  Occupational History  . Not on file  Tobacco Use  . Smoking status: Never Smoker  . Smokeless tobacco: Never Used  Vaping Use  . Vaping Use: Never used  Substance and Sexual Activity  . Alcohol use: No    Alcohol/week: 0.0 standard drinks    . Drug use: No  . Sexual activity: Yes    Birth control/protection: Post-menopausal    Comment: Pt declined sexual hx questions  Other Topics Concern  . Not on file  Social History Narrative   h h of 2 Pet cat   Daughter /  Married    g1 p1   Works at [employer] Strain:   . Difficulty of Paying Living Expenses: Not on file  Food Insecurity:   . Worried About Charity fundraiser in the Last Year: Not on file  . Ran Out of Food in the Last Year: Not on file  Transportation Needs:   . Lack of Transportation (Medical): Not on file  . Lack of Transportation (Non-Medical): Not on file  Physical Activity:   . Days of Exercise per Week: Not on file  . Minutes of Exercise per Session: Not on file  Stress:   . Feeling of Stress : Not on file  Social Connections:   . Frequency of Communication with Friends and Family: Not on file  . Frequency of Social Gatherings with Friends and Family: Not on file  . Attends Religious Services: Not on file  . Active Member of Clubs or Organizations: Not on file  . Attends Archivist Meetings: Not on file  . Marital Status: Not on file    Outpatient Medications Prior to Visit  Medication Sig Dispense Refill  . Ascorbic Acid (VITAMIN C) 500 MG CAPS Take 1,000 mg by mouth every morning.     Marland Kitchen b complex vitamins tablet Take 2 tablets by mouth daily.     Marland Kitchen CALCIUM-MAGNESIUM PO Take 3 tablets by mouth daily.     . cetirizine (ZYRTEC) 10 MG tablet Take 10 mg by mouth daily.    . cyclobenzaprine (FLEXERIL) 10 MG tablet Take 10 mg by mouth as needed for muscle spasms.     . hydrochlorothiazide (HYDRODIURIL) 25 MG tablet TAKE 1 TABLET BY MOUTH DAILY 90 tablet 0  . HYDROcodone-acetaminophen (NORCO) 7.5-325 MG per tablet Take 1-2 tablets by mouth every 4 (four) hours as needed for pain (as needed for migraine). Reported on 09/23/2015    . HYDROmorphone (DILAUDID) 4 MG tablet Take 4-8 mg by mouth  every 4 (four) hours as needed for pain (rescure for migraine). Reported on 09/23/2015    . meclizine (ANTIVERT) 25 MG tablet TAKE 2 TABLET (50 MG TOTAL) BY MOUTH 2 (TWO) TIMES DAILY AS NEEDED (FOR TRAVEL).  30 tablet 0  . Multiple Vitamins-Iron (MULTIVITAMIN/IRON) TABS Take 1 tablet by mouth daily.     Marland Kitchen nystatin (NYSTATIN) powder Apply topically 4 (four) times daily. As needed for rash 30 g 2  . sulindac (CLINORIL) 200 MG tablet Take 1 tablet (200 mg total) by mouth 2 (two) times daily as needed (headache). Reported on 09/23/2015 30 tablet 3  . SUMAtriptan (IMITREX) 6 MG/0.5ML SOLN injection Inject 6 mg into the skin every 2 (two) hours as needed for migraine. Reported on 09/23/2015    . traMADol (ULTRAM) 50 MG tablet Take 50 mg by mouth as needed for severe pain.     . verapamil (CALAN-SR) 180 MG CR tablet Take 1 tablet (180 mg total) by mouth at bedtime. 5 tablet 0  . zolmitriptan (ZOMIG ZMT) 5 MG disintegrating tablet Take 1 tablet (5 mg total) by mouth as needed for migraine. May repeat in 2 hours 10 tablet 2  . venlafaxine XR (EFFEXOR XR) 37.5 MG 24 hr capsule Take 1 capsule (37.5 mg total) by mouth daily with breakfast. Increase to 75 mg in am( 2 capsules) after 1 week. (Patient not taking: Reported on 05/24/2020) 60 capsule 1   Facility-Administered Medications Prior to Visit  Medication Dose Route Frequency Provider Last Rate Last Admin  . methylPREDNISolone acetate (DEPO-MEDROL) injection 120 mg  120 mg Intramuscular Once Zi Newbury, [name], MD         EXAM:  BP 134/84   Pulse 82   Temp 98.6 F (37 C) (Oral)   Ht 5' 7.5" (1.715 m)   Wt 214 lb 12.8 oz (97.4 kg)   SpO2 97%   BMI 33.15 kg/m   Body mass index is 33.15 kg/m. Wt Readings from Last 3 Encounters:  05/24/20 214 lb 12.8 oz (97.4 kg)  03/29/20 217 lb 3.2 oz (98.5 kg)  09/08/19 224 lb 6.4 oz (101.8 kg)    Physical Exam: Vital signs reviewed HER:DEYC is a well-developed well-nourished alert cooperative    who appearsr  stated age in no acute distress.  HEENT: normocephalic atraumatic , Eyes: PERRL EOM's full, conjunctiva clear, Nares: paten,t no deformity discharge or tenderness., Ears: no deformity EAC's clear TMs with normal landmarks. Mouth: Masked NECK: supple without masses, thyromegaly or bruits. CHEST/PULM:  Clear to auscultation and percussion breath sounds equal no wheeze , rales or rhonchi. No chest wall deformities or tenderness.. CV: PMI is nondisplaced, S1 S2 no gallops, murmurs, rubs. Peripheral pulses are present without delay.No JVD .  ABDOMEN: Bowel sounds normal nontender  No guard or rebound, no hepato splenomegal no CVA tenderness.   Extremtities:  No clubbing cyanosis or edema, no acute joint swelling or redness no focal atrophy NEURO:  Oriented x3, cranial nerves 3-12 appear to be intact, no obvious focal weakness,gait within normal limits SKIN: No acute rashes normal turgor, color, no bruising or petechiae. PSYCH: Oriented, good eye contact, no obvious depression anxiety, cognition and judgment appear normal. LN: no cervical axillary inguinal adenopathy  Lab Results  Component Value Date   WBC 9.4 07/22/2019   HGB 13.5 07/22/2019   HCT 41.9 07/22/2019   PLT 349 07/22/2019   GLUCOSE 122 (H) 07/22/2019   CHOL 197 05/22/2019   TRIG 95.0 05/22/2019   HDL 61.10 05/22/2019   LDLDIRECT 133.8 04/07/2013   LDLCALC 117 (H) 05/22/2019   ALT 13 05/22/2019   AST 14 05/22/2019   NA 141 07/22/2019   K 3.6 07/22/2019   CL 104 07/22/2019   CREATININE 0.85 07/22/2019  BUN 16 07/22/2019   CO2 27 07/22/2019   TSH 3.67 05/22/2019   HGBA1C 5.8 05/22/2019    BP Readings from Last 3 Encounters:  05/24/20 134/84  03/29/20 125/64  09/08/19 128/88    Lab results reviewed with patient   ASSESSMENT AND PLAN:  Discussed the following assessment and plan:    ICD-10-CM   1. Visit for preventive health examination  Z00.00 Hemoglobin A1c    TSH    Hepatic function panel    Lipid panel     BASIC METABOLIC PANEL WITH GFR    CBC with Differential/Platelet    CBC with Differential/Platelet    BASIC METABOLIC PANEL WITH GFR    Lipid panel    Hepatic function panel    TSH    Hemoglobin A1c  2. Medication management  Z79.899 Hemoglobin A1c    TSH    Hepatic function panel    Lipid panel    BASIC METABOLIC PANEL WITH GFR    CBC with Differential/Platelet    CBC with Differential/Platelet    BASIC METABOLIC PANEL WITH GFR    Lipid panel    Hepatic function panel    TSH    Hemoglobin A1c  3. Elevated hemoglobin (HCC)  D58.2 Hemoglobin A1c    TSH    Hepatic function panel    Lipid panel    BASIC METABOLIC PANEL WITH GFR    CBC with Differential/Platelet    CBC with Differential/Platelet    BASIC METABOLIC PANEL WITH GFR    Lipid panel    Hepatic function panel    TSH    Hemoglobin A1c  4. Fasting hyperglycemia  R73.01 Hemoglobin A1c    TSH    Hepatic function panel    Lipid panel    BASIC METABOLIC PANEL WITH GFR    CBC with Differential/Platelet    CBC with Differential/Platelet    BASIC METABOLIC PANEL WITH GFR    Lipid panel    Hepatic function panel    TSH    Hemoglobin A1c  5. Other migraine without status migrainosus, not intractable  G43.809 Hemoglobin A1c    TSH    Hepatic function panel    Lipid panel    BASIC METABOLIC PANEL WITH GFR    CBC with Differential/Platelet    CBC with Differential/Platelet    BASIC METABOLIC PANEL WITH GFR    Lipid panel    Hepatic function panel    TSH    Hemoglobin A1c   Side effects of oral triptans prescription given to get filled if needed as a trial  6. Need for shingles vaccine  Z23 Varicella-zoster vaccine IM (Shingrix)  Due for medication and lab monitoring. She has pending surgery in regard to her back predicament. She can try new tach if needed when she goes back to work for insurance pays with failure with oral triptans.  Because of side effects. Would avoid narcotics at this point and only rarely for  rescue consider other neurology referrals of week need to in regard to her headaches.  Interesting they are much better since she is been on short-term disability  Return in about 1 year (around 05/24/2021) for depending on results, preventive /cpx and medications.  Patient Care Team: Dinari Stgermaine, [name], MD as PCP - General (Internal Medicine) Phineas Real, Belinda Block, MD (Inactive) as Attending Physician (Obstetrics and Gynecology) Jarome Matin, MD as Consulting Physician (Dermatology) Thalia Bloodgood, OD as Referring Physician (Optometry) Patient Instructions  Will notify you  of labs  when available.  Attention to healthy eating and activity  Stay hydrated  Avoid sugar drinks .  Can try a new med  For  Migraines  If needed when goes back to work if migraines flare  Only one a day at most  Instead of the triptan medication.  Second shingles  vaccine in 2-6 months    Health Maintenance, Female Adopting a healthy lifestyle and getting preventive care are important in promoting health and wellness. Ask your health care provider about:  The right schedule for you to have regular tests and exams.  Things you can do on your own to prevent diseases and keep yourself healthy. What should I know about diet, weight, and exercise? Eat a healthy diet   Eat a diet that includes plenty of vegetables, fruits, low-fat dairy products, and lean protein.  Do not eat a lot of foods that are high in solid fats, added sugars, or sodium. Maintain a healthy weight Body mass index (BMI) is used to identify weight problems. It estimates body fat based on height and weight. Your health care provider can help determine your BMI and help you achieve or maintain a healthy weight. Get regular exercise Get regular exercise. This is one of the most important things you can do for your health. Most adults should:  Exercise for at least 150 minutes each week. The exercise should increase your heart rate and make you sweat  (moderate-intensity exercise).  Do strengthening exercises at least twice a week. This is in addition to the moderate-intensity exercise.  Spend less time sitting. Even light physical activity can be beneficial. Watch cholesterol and blood lipids Have your blood tested for lipids and cholesterol at 60 years of age, then have this test every 5 years. Have your cholesterol levels checked more often if:  Your lipid or cholesterol levels are high.  You are older than 60 years of age.  You are at high risk for heart disease. What should I know about cancer screening? Depending on your health history and family history, you may need to have cancer screening at various ages. This may include screening for:  Breast cancer.  Cervical cancer.  Colorectal cancer.  Skin cancer.  Lung cancer. What should I know about heart disease, diabetes, and high blood pressure? Blood pressure and heart disease  High blood pressure causes heart disease and increases the risk of stroke. This is more likely to develop in people who have high blood pressure readings, are of African descent, or are overweight.  Have your blood pressure checked: ? Every 3-5 years if you are 21-21 years of age. ? Every year if you are 46 years old or older. Diabetes Have regular diabetes screenings. This checks your fasting blood sugar level. Have the screening done:  Once every three years after age 47 if you are at a normal weight and have a low risk for diabetes.  More often and at a younger age if you are overweight or have a high risk for diabetes. What should I know about preventing infection? Hepatitis B If you have a higher risk for hepatitis B, you should be screened for this virus. Talk with your health care provider to find out if you are at risk for hepatitis B infection. Hepatitis C Testing is recommended for:  Everyone born from [birth year] through 1966.  Anyone with known risk factors for hepatitis  C. Sexually transmitted infections (STIs)  Get screened for STIs, including gonorrhea and chlamydia, if: ? You are  sexually active and are younger than 60 years of age. ? You are older than 60 years of age and your health care provider tells you that you are at risk for this type of infection. ? Your sexual activity has changed since you were last screened, and you are at increased risk for chlamydia or gonorrhea. Ask your health care provider if you are at risk.  Ask your health care provider about whether you are at high risk for HIV. Your health care provider may recommend a prescription medicine to help prevent HIV infection. If you choose to take medicine to prevent HIV, you should first get tested for HIV. You should then be tested every 3 months for as long as you are taking the medicine. Pregnancy  If you are about to stop having your period (premenopausal) and you may become pregnant, seek counseling before you get pregnant.  Take 400 to 800 micrograms (mcg) of folic acid every day if you become pregnant.  Ask for birth control (contraception) if you want to prevent pregnancy. Osteoporosis and menopause Osteoporosis is a disease in which the bones lose minerals and strength with aging. This can result in bone fractures. If you are 58 years old or older, or if you are at risk for osteoporosis and fractures, ask your health care provider if you should:  Be screened for bone loss.  Take a calcium or vitamin D supplement to lower your risk of fractures.  Be given hormone replacement therapy (HRT) to treat symptoms of menopause. Follow these instructions at home: Lifestyle  Do not use any products that contain nicotine or tobacco, such as cigarettes, e-cigarettes, and chewing tobacco. If you need help quitting, ask your health care provider.  Do not use street drugs.  Do not share needles.  Ask your health care provider for help if you need support or information about quitting  drugs. Alcohol use  Do not drink alcohol if: ? Your health care provider tells you not to drink. ? You are pregnant, may be pregnant, or are planning to become pregnant.  If you drink alcohol: ? Limit how much you use to 0-1 drink a day. ? Limit intake if you are breastfeeding.  Be aware of how much alcohol is in your drink. In the [place] one drink equals one 12 oz bottle of beer (355 mL), one 5 oz glass of wine (148 mL), or one 1 oz glass of hard liquor (44 mL). General instructions  Schedule regular health, dental, and eye exams.  Stay current with your vaccines.  Tell your health care provider if: ? You often feel depressed. ? You have ever been abused or do not feel safe at home. Summary  Adopting a healthy lifestyle and getting preventive care are important in promoting health and wellness.  Follow your health care provider's instructions about healthy diet, exercising, and getting tested or screened for diseases.  Follow your health care provider's instructions on monitoring your cholesterol and blood pressure. This information is not intended to replace advice given to you by your health care provider. Make sure you discuss any questions you have with your health care provider. Document Revised: 06/01/2018 Document Reviewed: 06/01/2018 Elsevier Patient Education  2020 [place] Jayleah Garbers M.D.

## 2020-05-24 ENCOUNTER — Encounter: Payer: Self-pay | Admitting: Internal Medicine

## 2020-05-24 ENCOUNTER — Ambulatory Visit (INDEPENDENT_AMBULATORY_CARE_PROVIDER_SITE_OTHER): Payer: BC Managed Care – PPO | Admitting: Internal Medicine

## 2020-05-24 ENCOUNTER — Other Ambulatory Visit: Payer: Self-pay

## 2020-05-24 VITALS — BP 134/84 | HR 82 | Temp 98.6°F | Ht 67.5 in | Wt 214.8 lb

## 2020-05-24 DIAGNOSIS — R7301 Impaired fasting glucose: Secondary | ICD-10-CM | POA: Diagnosis not present

## 2020-05-24 DIAGNOSIS — D582 Other hemoglobinopathies: Secondary | ICD-10-CM | POA: Diagnosis not present

## 2020-05-24 DIAGNOSIS — Z0000 Encounter for general adult medical examination without abnormal findings: Secondary | ICD-10-CM | POA: Diagnosis not present

## 2020-05-24 DIAGNOSIS — Z79899 Other long term (current) drug therapy: Secondary | ICD-10-CM

## 2020-05-24 DIAGNOSIS — G43809 Other migraine, not intractable, without status migrainosus: Secondary | ICD-10-CM | POA: Diagnosis not present

## 2020-05-24 DIAGNOSIS — Z23 Encounter for immunization: Secondary | ICD-10-CM | POA: Diagnosis not present

## 2020-05-24 MED ORDER — NURTEC 75 MG PO TBDP: 1.0000 | ORAL_TABLET | ORAL | 0 refills | Status: DC | PRN

## 2020-05-24 NOTE — Patient Instructions (Addendum)
Will notify you  of labs when available.  Attention to healthy eating and activity  Stay hydrated  Avoid sugar drinks .  Can try a new med  For  Migraines  If needed when goes back to work if migraines flare  Only one a day at most  Instead of the triptan medication.  Second shingles  vaccine in 2-6 months    Health Maintenance, Female Adopting a healthy lifestyle and getting preventive care are important in promoting health and wellness. Ask your health care provider about:  The right schedule for you to have regular tests and exams.  Things you can do on your own to prevent diseases and keep yourself healthy. What should I know about diet, weight, and exercise? Eat a healthy diet   Eat a diet that includes plenty of vegetables, fruits, low-fat dairy products, and lean protein.  Do not eat a lot of foods that are high in solid fats, added sugars, or sodium. Maintain a healthy weight Body mass index (BMI) is used to identify weight problems. It estimates body fat based on height and weight. Your health care provider can help determine your BMI and help you achieve or maintain a healthy weight. Get regular exercise Get regular exercise. This is one of the most important things you can do for your health. Most adults should:  Exercise for at least 150 minutes each week. The exercise should increase your heart rate and make you sweat (moderate-intensity exercise).  Do strengthening exercises at least twice a week. This is in addition to the moderate-intensity exercise.  Spend less time sitting. Even light physical activity can be beneficial. Watch cholesterol and blood lipids Have your blood tested for lipids and cholesterol at 60 years of age, then have this test every 5 years. Have your cholesterol levels checked more often if:  Your lipid or cholesterol levels are high.  You are older than 60 years of age.  You are at high risk for heart disease. What should I know about  cancer screening? Depending on your health history and family history, you may need to have cancer screening at various ages. This may include screening for:  Breast cancer.  Cervical cancer.  Colorectal cancer.  Skin cancer.  Lung cancer. What should I know about heart disease, diabetes, and high blood pressure? Blood pressure and heart disease  High blood pressure causes heart disease and increases the risk of stroke. This is more likely to develop in people who have high blood pressure readings, are of African descent, or are overweight.  Have your blood pressure checked: ? Every 3-5 years if you are 39-69 years of age. ? Every year if you are 104 years old or older. Diabetes Have regular diabetes screenings. This checks your fasting blood sugar level. Have the screening done:  Once every three years after age 37 if you are at a normal weight and have a low risk for diabetes.  More often and at a younger age if you are overweight or have a high risk for diabetes. What should I know about preventing infection? Hepatitis B If you have a higher risk for hepatitis B, you should be screened for this virus. Talk with your health care provider to find out if you are at risk for hepatitis B infection. Hepatitis C Testing is recommended for:  Everyone born from [birth year] through 1966.  Anyone with known risk factors for hepatitis C. Sexually transmitted infections (STIs)  Get screened for STIs, including gonorrhea and  chlamydia, if: ? You are sexually active and are younger than 60 years of age. ? You are older than 60 years of age and your health care provider tells you that you are at risk for this type of infection. ? Your sexual activity has changed since you were last screened, and you are at increased risk for chlamydia or gonorrhea. Ask your health care provider if you are at risk.  Ask your health care provider about whether you are at high risk for HIV. Your health care  provider may recommend a prescription medicine to help prevent HIV infection. If you choose to take medicine to prevent HIV, you should first get tested for HIV. You should then be tested every 3 months for as long as you are taking the medicine. Pregnancy  If you are about to stop having your period (premenopausal) and you may become pregnant, seek counseling before you get pregnant.  Take 400 to 800 micrograms (mcg) of folic acid every day if you become pregnant.  Ask for birth control (contraception) if you want to prevent pregnancy. Osteoporosis and menopause Osteoporosis is a disease in which the bones lose minerals and strength with aging. This can result in bone fractures. If you are 19 years old or older, or if you are at risk for osteoporosis and fractures, ask your health care provider if you should:  Be screened for bone loss.  Take a calcium or vitamin D supplement to lower your risk of fractures.  Be given hormone replacement therapy (HRT) to treat symptoms of menopause. Follow these instructions at home: Lifestyle  Do not use any products that contain nicotine or tobacco, such as cigarettes, e-cigarettes, and chewing tobacco. If you need help quitting, ask your health care provider.  Do not use street drugs.  Do not share needles.  Ask your health care provider for help if you need support or information about quitting drugs. Alcohol use  Do not drink alcohol if: ? Your health care provider tells you not to drink. ? You are pregnant, may be pregnant, or are planning to become pregnant.  If you drink alcohol: ? Limit how much you use to 0-1 drink a day. ? Limit intake if you are breastfeeding.  Be aware of how much alcohol is in your drink. In the [place] one drink equals one 12 oz bottle of beer (355 mL), one 5 oz glass of wine (148 mL), or one 1 oz glass of hard liquor (44 mL). General instructions  Schedule regular health, dental, and eye exams.  Stay current  with your vaccines.  Tell your health care provider if: ? You often feel depressed. ? You have ever been abused or do not feel safe at home. Summary  Adopting a healthy lifestyle and getting preventive care are important in promoting health and wellness.  Follow your health care provider's instructions about healthy diet, exercising, and getting tested or screened for diseases.  Follow your health care provider's instructions on monitoring your cholesterol and blood pressure. This information is not intended to replace advice given to you by your health care provider. Make sure you discuss any questions you have with your health care provider. Document Revised: 06/01/2018 Document Reviewed: 06/01/2018 Elsevier Patient Education  2020 [employer].

## 2020-05-25 LAB — CBC WITH DIFFERENTIAL/PLATELET
Absolute Monocytes: 587 cells/uL (ref 200–950)
Basophils Absolute: 62 cells/uL (ref 0–200)
Basophils Relative: 0.7 %
Eosinophils Absolute: 116 cells/uL (ref 15–500)
Eosinophils Relative: 1.3 %
HCT: 44.7 % (ref 35.0–45.0)
Hemoglobin: 14.9 g/dL (ref 11.7–15.5)
Lymphs Abs: 2448 cells/uL (ref 850–3900)
MCH: 28.9 pg (ref 27.0–33.0)
MCHC: 33.3 g/dL (ref 32.0–36.0)
MCV: 86.6 fL (ref 80.0–100.0)
MPV: 9.7 fL (ref 7.5–12.5)
Monocytes Relative: 6.6 %
Neutro Abs: 5687 cells/uL (ref 1500–7800)
Neutrophils Relative %: 63.9 %
Platelets: 342 10*3/uL (ref 140–400)
RBC: 5.16 10*6/uL — ABNORMAL HIGH (ref 3.80–5.10)
RDW: 14.1 % (ref 11.0–15.0)
Total Lymphocyte: 27.5 %
WBC: 8.9 10*3/uL (ref 3.8–10.8)

## 2020-05-25 LAB — HEPATIC FUNCTION PANEL
AG Ratio: 1.7 (calc) (ref 1.0–2.5)
ALT: 24 U/L (ref 6–29)
AST: 25 U/L (ref 10–35)
Albumin: 4.4 g/dL (ref 3.6–5.1)
Alkaline phosphatase (APISO): 65 U/L (ref 37–153)
Bilirubin, Direct: 0.1 mg/dL (ref 0.0–0.2)
Globulin: 2.6 g/dL (calc) (ref 1.9–3.7)
Indirect Bilirubin: 0.5 mg/dL (calc) (ref 0.2–1.2)
Total Bilirubin: 0.6 mg/dL (ref 0.2–1.2)
Total Protein: 7 g/dL (ref 6.1–8.1)

## 2020-05-25 LAB — LIPID PANEL
Cholesterol: 222 mg/dL — ABNORMAL HIGH (ref <200)
HDL: 62 mg/dL (ref >=50)
LDL Cholesterol (Calc): 126 mg/dL (calc) — ABNORMAL HIGH
Non-HDL Cholesterol (Calc): 160 mg/dL (calc) — ABNORMAL HIGH (ref <130)
Total CHOL/HDL Ratio: 3.6 (calc) (ref <5.0)
Triglycerides: 200 mg/dL — ABNORMAL HIGH (ref <150)

## 2020-05-25 LAB — TSH: TSH: 6.13 mIU/L — ABNORMAL HIGH (ref 0.40–4.50)

## 2020-05-25 LAB — BASIC METABOLIC PANEL WITH GFR
BUN: 17 mg/dL (ref 7–25)
CO2: 27 mmol/L (ref 20–32)
Calcium: 9.7 mg/dL (ref 8.6–10.4)
Chloride: 99 mmol/L (ref 98–110)
Creat: 0.94 mg/dL (ref 0.50–0.99)
GFR, Est African American: 76 mL/min/{1.73_m2} (ref >=60)
GFR, Est Non African American: 66 mL/min/{1.73_m2} (ref >=60)
Glucose, Bld: 98 mg/dL (ref 65–99)
Potassium: 3.8 mmol/L (ref 3.5–5.3)
Sodium: 137 mmol/L (ref 135–146)

## 2020-05-25 LAB — HEMOGLOBIN A1C
Hgb A1c MFr Bld: 5.6 % of total Hgb (ref <5.7)
Mean Plasma Glucose: 114 (calc)
eAG (mmol/L): 6.3 (calc)

## 2020-06-03 ENCOUNTER — Other Ambulatory Visit: Payer: Self-pay | Admitting: Internal Medicine

## 2020-06-06 DIAGNOSIS — M5126 Other intervertebral disc displacement, lumbar region: Secondary | ICD-10-CM | POA: Diagnosis not present

## 2020-06-06 DIAGNOSIS — M5116 Intervertebral disc disorders with radiculopathy, lumbar region: Secondary | ICD-10-CM | POA: Diagnosis not present

## 2020-06-06 HISTORY — PX: BACK SURGERY: SHX140

## 2020-06-10 NOTE — Progress Notes (Signed)
Thyroid is borderline off  cholesterol up from last year. Rest of labin rnage of normal . Intensify lifestyle  life style changes  and diet . FU lab  TSH, free t4 . Thyroid TPO antibodies  ( make sure not taking biotin that can effect results )   .The 10-year ASCVD risk score Mikey Bussing DC Brooke Bonito., et al., 2013) is: 4.8%   Values used to calculate the score:     Age: 59 years     Sex: Female     Is Non-Hispanic African American: No     Diabetic: No     Tobacco smoker: No     Systolic Blood Pressure: 815 mmHg     Is BP treated: Yes     HDL Cholesterol: 62 mg/dL     Total Cholesterol: 222 mg/dL

## 2020-06-14 NOTE — Progress Notes (Signed)
Do labs in about a month.

## 2020-06-18 NOTE — Telephone Encounter (Signed)
So I was out of the office for 2 weeks and that is why there was a delay in a comment on your lab results .  The results  should have still been  available immediately to you on MyChart.  Since there were no critical alarming findings there was no review by other providers when I was out of office.  Apologies for any concerns.  You do not have to stop the biotin long-term t Was advising to stop only for a couple days before getting your blood drawn.  The biotin can interfere with the thyroid assay but if you feel it is crucial to remain on it you do not have to stop it but I have to know if you were taking it when the blood was drawn.  I am glad the be complexes helps you with your migraines.  Get the thyroid blood tests tests in Jan or February  Any time  you do not have to fast.

## 2020-06-20 ENCOUNTER — Other Ambulatory Visit: Payer: Self-pay | Admitting: Internal Medicine

## 2020-06-20 DIAGNOSIS — R946 Abnormal results of thyroid function studies: Secondary | ICD-10-CM

## 2020-06-28 ENCOUNTER — Other Ambulatory Visit: Payer: Self-pay

## 2020-06-28 ENCOUNTER — Ambulatory Visit (INDEPENDENT_AMBULATORY_CARE_PROVIDER_SITE_OTHER): Payer: BC Managed Care – PPO | Admitting: Obstetrics and Gynecology

## 2020-06-28 ENCOUNTER — Encounter: Payer: Self-pay | Admitting: Obstetrics and Gynecology

## 2020-06-28 VITALS — BP 124/82 | Ht 67.0 in | Wt 214.0 lb

## 2020-06-28 DIAGNOSIS — Z13820 Encounter for screening for osteoporosis: Secondary | ICD-10-CM

## 2020-06-28 DIAGNOSIS — Z01419 Encounter for gynecological examination (general) (routine) without abnormal findings: Secondary | ICD-10-CM | POA: Diagnosis not present

## 2020-06-28 DIAGNOSIS — Z9189 Other specified personal risk factors, not elsewhere classified: Secondary | ICD-10-CM

## 2020-06-28 NOTE — Progress Notes (Signed)
Laura Smith [birth year] [id number]  SUBJECTIVE:  60 y.o. G1P1001 female for annual routine gynecologic exam.  Recently had back surgery and is still recovering from that. She has no gynecologic concerns.  Current Outpatient Medications  Medication Sig Dispense Refill  . Ascorbic Acid (VITAMIN C) 500 MG CAPS Take 1,000 mg by mouth every morning.     Marland Kitchen b complex vitamins tablet Take 2 tablets by mouth daily.    Marland Kitchen CALCIUM-MAGNESIUM PO Take 3 tablets by mouth daily.     . cetirizine (ZYRTEC) 10 MG tablet Take 10 mg by mouth daily.    . cyclobenzaprine (FLEXERIL) 10 MG tablet Take 10 mg by mouth as needed for muscle spasms.     . hydrochlorothiazide (HYDRODIURIL) 25 MG tablet TAKE 1 TABLET BY MOUTH DAILY 90 tablet 3  . HYDROcodone-acetaminophen (NORCO) 7.5-325 MG per tablet Take 1-2 tablets by mouth every 4 (four) hours as needed for pain (as needed for migraine). Reported on 09/23/2015    . HYDROmorphone (DILAUDID) 4 MG tablet Take 4-8 mg by mouth every 4 (four) hours as needed for pain (rescure for migraine). Reported on 09/23/2015    . meclizine (ANTIVERT) 25 MG tablet TAKE 2 TABLET (50 MG TOTAL) BY MOUTH 2 (TWO) TIMES DAILY AS NEEDED (FOR TRAVEL). 30 tablet 0  . Multiple Vitamins-Iron (MULTIVITAMIN/IRON) TABS Take 1 tablet by mouth daily.    Marland Kitchen nystatin (NYSTATIN) powder Apply topically 4 (four) times daily. As needed for rash 30 g 2  . Rimegepant Sulfate (NURTEC) 75 MG TBDP Take 1 tablet by mouth as needed (for migraine). 30 tablet 0  . sulindac (CLINORIL) 200 MG tablet Take 1 tablet (200 mg total) by mouth 2 (two) times daily as needed (headache). Reported on 09/23/2015 30 tablet 3  . SUMAtriptan (IMITREX) 6 MG/0.5ML SOLN injection Inject 6 mg into the skin every 2 (two) hours as needed for migraine. Reported on 09/23/2015    . traMADol (ULTRAM) 50 MG tablet Take 50 mg by mouth as needed for severe pain.     . verapamil (CALAN-SR) 180 MG CR tablet TAKE 1 TABLET BY MOUTH AT BEDTIME 90 tablet 3  .  zolmitriptan (ZOMIG ZMT) 5 MG disintegrating tablet Take 1 tablet (5 mg total) by mouth as needed for migraine. May repeat in 2 hours 10 tablet 2   Current Facility-Administered Medications  Medication Dose Route Frequency Provider Last Rate Last Admin  . methylPREDNISolone acetate (DEPO-MEDROL) injection 120 mg  120 mg Intramuscular Once Panosh, [name], MD       Allergies: Etodolac, Latex, Other, and Adhesive [tape]  No LMP recorded. Patient is postmenopausal.  Past medical history,surgical history, problem list, medications, allergies, family history and social history were all reviewed and documented as reviewed in the EPIC chart.  ROS: Pertinent positives and negatives as reviewed in HPI    OBJECTIVE:  BP 124/82 (BP Location: Right Arm, Patient Position: Sitting, Cuff Size: Normal)   Ht 5\' 7"  (1.702 m)   Wt 214 lb (97.1 kg)   BMI 33.52 kg/m  The patient appears well, alert, oriented, in no distress. ENT normal.  Neck supple. No cervical or supraclavicular adenopathy or thyromegaly.  Lungs are clear, good air entry, no wheezes, rhonchi or rales. S1 and S2 normal, no murmurs, regular rate and rhythm.  Abdomen soft without tenderness, guarding, mass or organomegaly.  Neurological is normal, no focal findings.  BREAST EXAM: breasts appear normal, no suspicious masses, no skin or nipple changes or axillary nodes  PELVIC EXAM: VULVA: normal appearing vulva with atrophic change, no masses, tenderness or lesions, VAGINA: normal appearing vagina with atrophic change, normal color and discharge, no lesions, CERVIX: normal appearing cervix without discharge or lesions, UTERUS: uterus is normal size, shape, consistency and nontender, ADNEXA: normal adnexa in size, nontender and no masses  Chaperone: Wandra Scot Bonham present during the examination  ASSESSMENT:  60 y.o. G1P1001 here for annual gynecologic exam  PLAN:   1. Postmenopausal.  No significant hot flashes or night sweats.  No  vaginal bleeding. 2. Pap smear/HPV 05/2018.  No significant history of abnormal Pap smears.  Next Pap smear due 2024 following the current guidelines recommending the 5 year interval. 3. Mammogram 12/2019.  Patient had a genetic counseling session 01/2020 with calculated lifetime risk of breast cancer at 22.7%, considered high risk.  Her expanded genetic mutation panel was negative.  Normal breast exam today.  We discussed the ACS recommendations that for individuals calculated high risk for breast cancer, annual breast MRI is also additionally recommended.  She does have an appointment coming up with Dr. Burr Medico in oncology to further go over the genetic counseling and testing results and recommendations for breast cancer screening. 4. Colonoscopy 2015.  Recommended that she follow up at the recommended interval.   5. DEXA never.  Discussed recommendation to have osteoporosis screening at age 81.  Since she recently had back surgery and is still recovering, I told her to plan on scheduling a DEXA scan in spring after recovery from back surgery.  She will plan to call back to do this. 6. Health maintenance.  No labs today as she normally has these completed elsewhere. Return annually or sooner, prn.  Joseph Pierini MD 06/28/20

## 2020-07-02 NOTE — Telephone Encounter (Signed)
Okay noted Hope you continue to recover from your back surgery in a timely manner.  Although you may have to wait give [place] a call if you need further help.  And cannot get to the MyChart.

## 2020-07-03 NOTE — Progress Notes (Signed)
[place]   [phone] Fax:([phone]) [place] Note   Patient Care Team: Panosh, [name], MD as PCP - General (Internal Medicine) Phineas Real, Belinda Block, MD (Inactive) as Attending Physician (Obstetrics and Gynecology) Jarome Matin, MD as Consulting Physician (Dermatology) Thalia Bloodgood, OD as Referring Physician (Optometry)  Date of Service:  07/05/2020   CHIEF COMPLAINTS/PURPOSE OF CONSULTATION:  High Risk for breast cancer   REFERRING PHYSICIAN:  Santiago Glad Powell-Genetic Counselor    HISTORY OF PRESENTING ILLNESS:  Dimas Aguas 60 y.o. female is a here because of high risk for breast cancer. The patient was referred by Roma Kayser, genetic counselor. The patient presents to the clinic today alone. The patient's past medical history includes hypertension, endometriosis, migraines, basal cell carcinoma, interstitial cystitis, hemorrhoids, and angina. The patient does not have any personal history for breast cancer, ovarian cancer, tubal cancer, peritoneal cancer, or any other malignancy other than basal cell carcinoma of the skin.   The patient's evaluation began after she was referred to genetic counseling in August 2021 by her OBGYN, Dr. Delilah Shan, due to her family history for malignancy. She met with genetic counseling on 02/12/20 to discuss her hereditary predisposition to cancer, genetic testing, and to clarify her future risk, as well as potential cancer risk for family.   Based on the patient'sfamilyhistory, a statistical model (Tyrer Cusik) was used to [employer] of developing breast cancer. This estimatesherlifetime risk of developing breast cancerto be approximately 22.7%. Her risk after 10 years is 9.6%. She had genetic testing performed which did not identify any known hereditary causes for her family history of cancer. The ACS recommends consideration of breast MRI screening as an adjunct to mammography for patients at high  risk (defined as 20% or greater lifetime risk) which she qualifies for.She was referred to the high risk breast clinic to be followed for her increased risk for breast cancer. She agreed to this referral.   Today, the patient is feeling fairly well without any concerning complaints except she recently had back surgery on 06/06/20 under the care of Dr. Arnoldo Morale for herniated discs. She recently had an office visit with her PCP on 05/24/20 and a routine gynocologic exam with her GYN on 06/28/20. She is due for her next pap smear in 2024. A breast exam was performed at that visit which was unremarkable. A DEXA scan was ordered to be performed in Spring 2022. This was ordered for spring to allow time to heal from her recent back surgery. The patient denies history of fractures. She denies any concerning breast lumps or lymphadenopathy. She denies breast swelling, nipple discharge, dimpling of the breast tissue, or overlying skin changes. She reports a history of cystic breasts. Denies unintentional weight loss. Denies appetite changes.   She is up to date on her mammogram, having had this performed at on 12/29/[facility] at [facility]. She states her next mammogram is already scheduled for July 15th, 2022. No suspicious lesions were identified on most recent mammogram. Her breast composition is category B. She has never required a breast biopsy. She has never received radiation to the chest.  She her last colonoscopy was performed on 04/27/14 under the care of Dr. Fuller Plan. At that time, two polyps were removed. He recommended a repeat colonoscopy in 5 years. She is aware that she is overdue for an appointment. She states she delayed her colonoscopy due to [place] and her insurance changing. She is presently out of work until March 2022 due  to her recent back surgery.   Her family history consists of a living mother who was diagnosed with breast cancer ~61 years of age. The patient's mother also had colorectal cancer. The patient does  not know how old her mother was when she was diagnosed. Her mother is 76 years old.  The patient's father is deceased and passed away secondary to a myocardial infarction at the age of 71. The patient's maternal grandparents are deceased from non-cancer related health concerns. The patient has a brother who passed away secondary to COPD. The patient had two paternal aunts. One paternal aunt passed away due to a heart condition at the age of 42. The other passed away at the age of 23 due to complications from hypertension. The patient had a cousin on her father's side who was diagnosed with breast cancer initially in her 105's. The breast cancer reoccurred at a later point but the patient does not know at what age. The paternal grandparents are deceased. The patient's grandfather had prostate cancer. The patient's grandmother was a never smoker and had lung cancer. The patient has 1 daughter who is healthy. She denies Ashkenazi [place], African [place], [place], [place], or Hispanic ancestry. Her mother's ancestors are caucasian decent. Her father's ancestor is English descent.   Regarding risk factors, she underwent menarche at the age of 85. She had her first live birth at the age of 50. She had 1 pregnancy and 1 live birth. She is postmenopausal. She still has her uterus/ovaries. She has never used hormone replacement therapy.   The patient works in Education administrator. She is married to her second husband and has 1 child. Denies tobacco use, alcohol use, or drug use. She does not have a regular exercise routine. She is trying to walk at least 100 yards daily as instructed by her surgeon. She has a tredmill at home. She states she tries to eat healthy but does not have a regular dietary routine.   REVIEW OF SYSTEMS:    Constitutional: Denies fevers, chills or abnormal night sweats Eyes: Denies blurriness of vision, double vision or watery eyes Ears, nose, mouth, throat, and face: Denies mucositis or  sore throat Respiratory: Denies cough, dyspnea or wheezes Cardiovascular: Denies palpitation, chest discomfort or lower extremity swelling Gastrointestinal:  Denies nausea, heartburn or change in bowel habits Skin: Denies abnormal skin rashes Lymphatics: Denies new lymphadenopathy or easy bruising Neurological:Denies numbness, tingling or new weaknesses Behavioral/Psych: Mood is stable, no new changes  All other systems were reviewed with the patient and are negative.   MEDICAL HISTORY:  Past Medical History:  Diagnosis Date  . Allergy   . Breast cyst   . Endometriosis    surgery laser 2001  . Family history of breast cancer   . Family history of colon cancer   . Family history of prostate cancer   . Hemorrhoids   . Hx of basal cell carcinoma   . Hypertension   . Migraine   . Urinary tract infection     SURGICAL HISTORY: Past Surgical History:  Procedure Laterality Date  . [place]  . HYSTEROSCOPY  08/1999   AT TIME OF LAPAROSCOPY  . PELVIC LAPAROSCOPY  08/1999   AND HYSTEROSCOPY--ENDOMETRIOSIS    SOCIAL HISTORY: Social History   Socioeconomic History  . Marital status: Married    Spouse name: Not on file  . Number of children: Not on  file  . Years of education: Not on file  . Highest education level: Not on file  Occupational History  . Not on file  Tobacco Use  . Smoking status: Never Smoker  . Smokeless tobacco: Never Used  Vaping Use  . Vaping Use: Never used  Substance and Sexual Activity  . Alcohol use: No    Alcohol/week: 0.0 standard drinks  . Drug use: No  . Sexual activity: Yes    Birth control/protection: Post-menopausal    Comment: Pt declined sexual hx questions  Other Topics Concern  . Not on file  Social History Narrative   h h of 2 Pet cat   Daughter /  Married    g1 p1   Works at [employer] Strain: Not  on [employer] Insecurity: Not on file  Transportation Needs: Not on file  Physical Activity: Not on file  Stress: Not on file  Social Connections: Not on file  Intimate Partner Violence: Not on file    FAMILY HISTORY: Family History  Problem Relation Age of Onset  . Breast cancer Mother        Age 76  . Colon cancer Mother 13  . Breast cancer Cousin 89       paternal first cousin  . Heart disease Father 63       Heart attack  . Hypertension Paternal Grandmother   . Heart disease Paternal Grandmother   . Stroke Paternal Grandmother   . Cancer Paternal Grandmother        Lung  . Cancer Paternal Grandfather        Prostate  . Stroke Maternal Grandfather   . COPD Maternal Uncle   . Liver cancer Paternal Uncle   . Congestive Heart Failure Maternal Grandmother   . Hypertension Paternal Aunt     ALLERGIES:  is allergic to etodolac, latex, other, and adhesive [tape].  MEDICATIONS:  Current Outpatient Medications  Medication Sig Dispense Refill  . Ascorbic Acid (VITAMIN C) 500 MG CAPS Take 1,000 mg by mouth every morning.     Marland Kitchen b complex vitamins tablet Take 2 tablets by mouth daily.    Marland Kitchen CALCIUM-MAGNESIUM PO Take 3 tablets by mouth daily.     . cetirizine (ZYRTEC) 10 MG tablet Take 10 mg by mouth daily.    . cyclobenzaprine (FLEXERIL) 10 MG tablet Take 10 mg by mouth as needed for muscle spasms.     . hydrochlorothiazide (HYDRODIURIL) 25 MG tablet TAKE 1 TABLET BY MOUTH DAILY 90 tablet 3  . HYDROcodone-acetaminophen (NORCO) 7.5-325 MG per tablet Take 1-2 tablets by mouth every 4 (four) hours as needed for pain (as needed for migraine). Reported on 09/23/2015    . HYDROmorphone (DILAUDID) 4 MG tablet Take 4-8 mg by mouth every 4 (four) hours as needed for pain (rescure for migraine). Reported on 09/23/2015    . meclizine (ANTIVERT) 25 MG tablet TAKE 2 TABLET (50 MG TOTAL) BY MOUTH 2 (TWO) TIMES DAILY AS NEEDED (FOR TRAVEL). 30 tablet 0  . Multiple Vitamins-Iron (MULTIVITAMIN/IRON)  TABS Take 1 tablet by mouth daily.    Marland Kitchen nystatin (NYSTATIN) powder Apply topically 4 (four) times daily. As needed for rash 30 g 2  . oxyCODONE-acetaminophen (PERCOCET/ROXICET) 5-325 MG tablet Take 1 tablet by mouth every 4 (four) hours as needed.    . Rimegepant Sulfate (NURTEC) 75 MG TBDP Take 1 tablet by mouth as needed (for migraine). 30 tablet 0  .  sulindac (CLINORIL) 200 MG tablet Take 1 tablet (200 mg total) by mouth 2 (two) times daily as needed (headache). Reported on 09/23/2015 30 tablet 3  . SUMAtriptan (IMITREX) 6 MG/0.5ML SOLN injection Inject 6 mg into the skin every 2 (two) hours as needed for migraine. Reported on 09/23/2015    . traMADol (ULTRAM) 50 MG tablet Take 50 mg by mouth as needed for severe pain.     . verapamil (CALAN-SR) 180 MG CR tablet TAKE 1 TABLET BY MOUTH AT BEDTIME 90 tablet 3  . zolmitriptan (ZOMIG ZMT) 5 MG disintegrating tablet Take 1 tablet (5 mg total) by mouth as needed for migraine. May repeat in 2 hours 10 tablet 2   Current Facility-Administered Medications  Medication Dose Route Frequency Provider Last Rate Last Admin  . methylPREDNISolone acetate (DEPO-MEDROL) injection 120 mg  120 mg Intramuscular Once Panosh, [name], MD        PHYSICAL EXAMINATION: ECOG PERFORMANCE STATUS: 0 - Asymptomatic  Vitals:   07/05/20 1131  BP: 137/71  Pulse: 79  Resp: 12  Temp: 98.4 F (36.9 C)  SpO2: 100%   Filed Weights   07/05/20 1131  Weight: 219 lb 3.2 oz (99.4 kg)    GENERAL:alert, no distress and comfortable SKIN: skin color, texture, turgor are normal, no rashes or significant lesions EYES: normal, Conjunctiva are pink and non-injected, sclera clear OROPHARYNX:no exudate, no erythema and lips, buccal mucosa, and tongue normal NECK: supple, thyroid normal size, non-tender, without nodularity LYMPH:  no palpable lymphadenopathy in the cervical, axillary  LUNGS: clear to auscultation and percussion with normal breathing effort HEART: regular rate &  rhythm and no murmurs and no lower extremity edema ABDOMEN:abdomen soft, non-tender and normal bowel sounds Musculoskeletal:no cyanosis of digits and no clubbing  NEURO: alert & oriented x 3 with fluent speech, no focal motor/sensory deficits BREAST: No palpable mass, nodules or adenopathy bilaterally. Breast exam benign.  LABORATORY DATA:  I have reviewed the data as listed CBC Latest Ref Rng & Units 05/24/2020 07/22/2019 05/22/2019  WBC 3.8 - 10.8 Thousand/uL 8.9 9.4 10.4  Hemoglobin 11.7 - 15.5 g/dL 14.9 13.5 15.8(H)  Hematocrit 35.0 - 45.0 % 44.7 41.9 47.8(H)  Platelets 140 - 400 Thousand/uL 342 349 367.0    CMP Latest Ref Rng & Units 05/24/2020 07/22/2019 05/22/2019  Glucose 65 - 99 mg/dL 98 122(H) 97  BUN 7 - 25 mg/dL 17 16 19   Creatinine 0.50 - 0.99 mg/dL 0.94 0.85 0.88  Sodium 135 - 146 mmol/L 137 141 138  Potassium 3.5 - 5.3 mmol/L 3.8 3.6 4.2  Chloride 98 - 110 mmol/L 99 104 99  CO2 20 - 32 mmol/L 27 27 30   Calcium 8.6 - 10.4 mg/dL 9.7 9.1 9.6  Total Protein 6.1 - 8.1 g/dL 7.0 - 7.1  Total Bilirubin 0.2 - 1.2 mg/dL 0.6 - 0.5  Alkaline Phos 39 - 117 U/L - - 63  AST 10 - 35 U/L 25 - 14  ALT 6 - 29 U/L 24 - 13     RADIOGRAPHIC STUDIES: I have personally reviewed the radiological images as listed and agreed with the findings in the report. No results found.    GENETIC TEST RESULTS: Genetic testing reported out on February 21, 2020 through the Common Hereditary cancer panel found no pathogenic mutations. The Common Hereditary Gene Panel offered by [facility] includes sequencing and/or deletion duplication testing of the following 48 genes: APC, ATM, AXIN2, BARD1, BMPR1A, BRCA1, BRCA2, BRIP1, CDH1, CDK4, CDKN2A (p14ARF), CDKN2A (p16INK4a), CHEK2,  CTNNA1, DICER1, EPCAM (Deletion/duplication testing only), GREM1 (promoter region deletion/duplication testing only), KIT, MEN1, MLH1, MSH2, MSH3, MSH6, MUTYH, NBN, NF1, NHTL1, PALB2, PDGFRA, PMS2, POLD1, POLE, PTEN, RAD50, RAD51C, RAD51D,  RNF43, SDHB, SDHC, SDHD, SMAD4, SMARCA4. STK11, TP53, TSC1, TSC2, and VHL.  The following genes were evaluated for sequence changes only: SDHA and HOXB13 c.251G>A variant only. The test report has been scanned into EPIC and is located under the Molecular Pathology section of the Results Review tab. A portion of the result report is included below for reference.     ASSESSMENT & PLAN:  JESSCIA IMM is a 60 y.o. Caucasian female with a history of H/o basal cell carcinoma, HTN, H/o endometriosis, migraines, interstitial cystitis, hemorrhoids, and angina.  The patient was seen with Dr. Burr Medico today.  1. High Risk Breast for Breast Cancer, 22.7% lifetime risk -The patient had a genetic counseling session in August/September 2021 due to her family history for malignancy. Mother with history of colorectal cancer and breast cancer. Paternal grandfather with prostate cancer. Paternal cousin with breast cancer. -Genetic counseling used a statistical model (Tyrer Cusik) to estimateherrisk of developing breast cancer. This estimatesherlifetime risk of developing breast cancerto be approximately 22.7%.  -Ms. Alden's genetic test result is considered negative (normal).  No identified hereditary cause for her family history of cancer at this time.  -Based on the American Cancer Society recommendations for high risk (defined as 20% or greater lifetime risk of developing breast cancer), she should consider breast MRI screening as an adjunct to mammography annually.  -Dr. Burr Medico discussed the risks and benefits of breast MRI. Dr. Burr Medico discussed MRI is very sensitive; however, there is more chance of false positives which may require biopsies to further evaluate.  -The patient is agreeable to annual breast MRI. Dr. Burr Medico recommends that we arrange for this within the month (January 2022 or early February 2022). She recommends this be staggered about 6 months apart from her routine mammogram which is scheduled for  July 2022).  -Genetic counseling also recommends women in her family to have yearly mammogram. All family members should be referred for colonoscopy at age 42.  -Her last colonoscopy was 6 years ago, recommend she follow up with Dr. Fuller Plan -She is UTD on gyn exam, next appointment with GYN in January 2023. DEXA ordered by GYN for spring 2022. -Breast exam unremarkable today -Given education handout on American Cancer Society exercise recommendations and nutrition education for the anti-cancer benefits of good nutrition and exercise. This was reviewed with the patient.  -Dr. Burr Medico would not recommend chemoprevention therapy at this time given the side effects and negative genetic markers for hereditary pre-disposition to cancer. Her calculated Gail score is 2.9% for 5 year risk of developing breast cancer.  -Dr. Burr Medico recommend monthly self breast exams.   -We will arrange a follow up with Dr. Burr Medico in June 2023 and annually there on after. Since the patient is scheduled to see her OBGYN in January 2023, Dr. Burr Medico would like to stagger out her appointments with GYN by 6 months.      PLAN:  -Breast MRI this month (January 2022) and annually there on after. Ordered to be performed at [place].  -Dr. Burr Medico recommends for her to continue annual mammogram (scheduled at [facility] in July 2022) -Follow up with Dr. Burr Medico in June 2023.      Orders Placed This Encounter  Procedures  . MR Breast Bilateral W Contrast    Please try to schedule this January  2022. Patient high risk breast cancer patient. Guidelines recommend annual breast MRI    Standing Status:   Future    Standing Expiration Date:   07/05/2021    Scheduling Instructions:     Please try to schedule this January 2022.    Order Specific Question:   If indicated for the ordered procedure, I authorize the administration of contrast media per Radiology protocol    Answer:   Yes    Order Specific Question:   What is the patient's sedation  requirement?    Answer:   No Sedation    Order Specific Question:   Does the patient have a pacemaker or implanted devices?    Answer:   No    Order Specific Question:   Preferred imaging location?    Answer:   [facility] (table limit-550lbs)   Cassandra L Heilingoetter PA-C 07/05/20  Addendum  I have seen the patient, examined her. I agree with the assessment and and plan and have edited the notes.   I discussed her risk of breast cancer based on [place] (life time 22.7%) which is high and management straggly including screening and prevention.  I recommend annual breast MRI in addition to annual screening mammogram, for intensive screening, and I recommend 6 months apart between this 2 screening tests.  I discussed that MRI is more sensitive, and that she may have more biopsy if she has concerning findings on scan.  She voiced good understanding and agreed to proceed.  We will start first MRI in the next months.  We discussed lifestyle for breast cancer prevention, including healthy diet, no alcohol and smoking, regular exercise, nutritional supplement and stay active and positive.  We also discussed weight management, and I encouraged her to lose weight which she will reduce her risk of breast cancer also.  Finally we discussed chemoprevention with antiestrogen therapy.  Based on the [place], she does not benefit from chemoprevention, and I do not recommend.  Pt is interested in following with [place], for breast exam and cancer screening. Will see her back in 6 months then annually for a few more year. If she is diligent on her cancer screening and prevention, she may just need follow-up with her PCP and GYN in the future.  Truitt Merle  07/05/2020

## 2020-07-04 DIAGNOSIS — D1801 Hemangioma of skin and subcutaneous tissue: Secondary | ICD-10-CM | POA: Diagnosis not present

## 2020-07-04 DIAGNOSIS — D2261 Melanocytic nevi of right upper limb, including shoulder: Secondary | ICD-10-CM | POA: Diagnosis not present

## 2020-07-04 DIAGNOSIS — L820 Inflamed seborrheic keratosis: Secondary | ICD-10-CM | POA: Diagnosis not present

## 2020-07-04 DIAGNOSIS — L821 Other seborrheic keratosis: Secondary | ICD-10-CM | POA: Diagnosis not present

## 2020-07-04 DIAGNOSIS — D2262 Melanocytic nevi of left upper limb, including shoulder: Secondary | ICD-10-CM | POA: Diagnosis not present

## 2020-07-05 ENCOUNTER — Encounter: Payer: Self-pay | Admitting: Physician Assistant

## 2020-07-05 ENCOUNTER — Inpatient Hospital Stay: Payer: BC Managed Care – PPO | Attending: Hematology | Admitting: Physician Assistant

## 2020-07-05 ENCOUNTER — Other Ambulatory Visit: Payer: Self-pay

## 2020-07-05 VITALS — BP 137/71 | HR 79 | Temp 98.4°F | Resp 12 | Ht 67.5 in | Wt 219.2 lb

## 2020-07-05 DIAGNOSIS — Z79899 Other long term (current) drug therapy: Secondary | ICD-10-CM

## 2020-07-05 DIAGNOSIS — Z8042 Family history of malignant neoplasm of prostate: Secondary | ICD-10-CM

## 2020-07-05 DIAGNOSIS — Z1501 Genetic susceptibility to malignant neoplasm of breast: Secondary | ICD-10-CM | POA: Diagnosis not present

## 2020-07-05 DIAGNOSIS — Z85828 Personal history of other malignant neoplasm of skin: Secondary | ICD-10-CM

## 2020-07-05 DIAGNOSIS — N809 Endometriosis, unspecified: Secondary | ICD-10-CM

## 2020-07-05 DIAGNOSIS — Z823 Family history of stroke: Secondary | ICD-10-CM

## 2020-07-05 DIAGNOSIS — Z9189 Other specified personal risk factors, not elsewhere classified: Secondary | ICD-10-CM | POA: Insufficient documentation

## 2020-07-05 DIAGNOSIS — Z8249 Family history of ischemic heart disease and other diseases of the circulatory system: Secondary | ICD-10-CM | POA: Diagnosis not present

## 2020-07-05 DIAGNOSIS — G43909 Migraine, unspecified, not intractable, without status migrainosus: Secondary | ICD-10-CM

## 2020-07-05 DIAGNOSIS — Z836 Family history of other diseases of the respiratory system: Secondary | ICD-10-CM

## 2020-07-05 DIAGNOSIS — K649 Unspecified hemorrhoids: Secondary | ICD-10-CM

## 2020-07-05 DIAGNOSIS — Z800 Family history of malignant neoplasm of digestive organs: Secondary | ICD-10-CM

## 2020-07-05 DIAGNOSIS — N3010 Interstitial cystitis (chronic) without hematuria: Secondary | ICD-10-CM

## 2020-07-05 DIAGNOSIS — I10 Essential (primary) hypertension: Secondary | ICD-10-CM | POA: Diagnosis not present

## 2020-07-05 DIAGNOSIS — Z803 Family history of malignant neoplasm of breast: Secondary | ICD-10-CM | POA: Diagnosis not present

## 2020-07-05 DIAGNOSIS — Z1239 Encounter for other screening for malignant neoplasm of breast: Secondary | ICD-10-CM

## 2020-07-08 ENCOUNTER — Other Ambulatory Visit: Payer: BC Managed Care – PPO

## 2020-07-09 ENCOUNTER — Other Ambulatory Visit: Payer: BC Managed Care – PPO

## 2020-07-11 ENCOUNTER — Other Ambulatory Visit (INDEPENDENT_AMBULATORY_CARE_PROVIDER_SITE_OTHER): Payer: BC Managed Care – PPO

## 2020-07-11 ENCOUNTER — Other Ambulatory Visit: Payer: Self-pay

## 2020-07-11 ENCOUNTER — Telehealth: Payer: Self-pay | Admitting: Internal Medicine

## 2020-07-11 DIAGNOSIS — R946 Abnormal results of thyroid function studies: Secondary | ICD-10-CM

## 2020-07-11 DIAGNOSIS — E039 Hypothyroidism, unspecified: Secondary | ICD-10-CM

## 2020-07-11 LAB — TSH: TSH: 6.81 u[IU]/mL — ABNORMAL HIGH (ref 0.35–4.50)

## 2020-07-11 LAB — T4, FREE: Free T4: 0.71 ng/dL (ref 0.60–1.60)

## 2020-07-11 MED ORDER — LEVOTHYROXINE SODIUM 50 MCG PO TABS: 50.0000 ug | ORAL_TABLET | Freq: Every day | ORAL | 1 refills | Status: DC

## 2020-07-11 NOTE — Addendum Note (Signed)
Addended by: Agnes Lawrence on: 07/11/2020 01:57 PM   Modules accepted: Orders

## 2020-07-11 NOTE — Addendum Note (Signed)
Addended by: Agnes Lawrence on: 07/11/2020 02:02 PM   Modules accepted: Orders

## 2020-07-11 NOTE — Telephone Encounter (Signed)
Patient had to stop taking B-Complex for her lab work, so she wants to know if it is ok to start taking it again?  Patient also needs a reference to [facility] [phone]).  She would like to see Dr. Leta Baptist-- she got a referral to this dr from a friend.  She would like to go due to memory issues and ability to retain new job responsibilities at work.  She has been called into the conference room about ability to do her job and perform her duties for this reason.    -Please give patient a call back.

## 2020-07-11 NOTE — Telephone Encounter (Signed)
Patient informed-see results note. 

## 2020-07-11 NOTE — Progress Notes (Signed)
So thyroid test is still off you may have a condition called subclinical hypothyroidism.  It is possible to affect how you feel and mental alertness.  the abnormality is not very severe.  And many people get no symptoms at all.  If you agree we can add a low-dose of thyroid medicine that you would take every day and we will check another level in 6 to 8 weeks.  We can do the neurology referral in the meantime since there would be a delay in your appointment time. It is okay to go back on your B vitamins and biotin. Please send in levothyroxine 50 mcg to take 1 po qd  disp 90 refill x1  Arrange  tsh and free t4 in 2 mos ( dont need to fast .)

## 2020-07-11 NOTE — Telephone Encounter (Signed)
   So sure can go back on your B vitamins.  Please see result note about adding thyroid medicine. And send in medication  do a referral to Dr. Corwin Levins as she requests prefers  for diagnosis change in memory.cognition

## 2020-07-12 LAB — THYROID PEROXIDASE ANTIBODY: Thyroperoxidase Ab SerPl-aCnc: <1 [IU]/mL

## 2020-07-29 ENCOUNTER — Other Ambulatory Visit: Payer: Self-pay

## 2020-07-29 ENCOUNTER — Ambulatory Visit (AMBULATORY_SURGERY_CENTER): Payer: BC Managed Care – PPO | Admitting: *Deleted

## 2020-07-29 VITALS — Ht 67.5 in | Wt 214.0 lb

## 2020-07-29 DIAGNOSIS — Z86010 Personal history of colonic polyps: Secondary | ICD-10-CM

## 2020-07-29 DIAGNOSIS — Z800 Family history of malignant neoplasm of digestive organs: Secondary | ICD-10-CM

## 2020-07-29 MED ORDER — PLENVU 140 G PO SOLR: 1.0000 | Freq: Once | ORAL | 0 refills | Status: AC

## 2020-07-29 NOTE — Progress Notes (Signed)
Patient's pre-visit was done today over the phone with the patient due to COVID-19 pandemic. Name,DOB and address verified. Insurance verified. Patient denies any allergies to Eggs and Soy. Patient denies any problems with anesthesia/sedation. Patient denies taking diet pills or blood thinners. Packet of Prep instructions mailed to patient including a copy of a consent form and pre-procedure patient acknowledgement form (with envelope to mail back to us)-pt is aware. Plenvu Coupon included. Patient understands to call us back with any questions or concerns. COVID-19 vaccines completed on 06/2020 x3, per patient. Patient is aware of our care-partner policy and [id number] safety protocol. EMMI education assigned to the patient for the procedure, sent to [place].

## 2020-08-01 ENCOUNTER — Ambulatory Visit
Admission: RE | Admit: 2020-08-01 | Discharge: 2020-08-01 | Disposition: A | Discharge status: Home / Self Care | Payer: BC Managed Care – PPO | Source: Ambulatory Visit | Attending: Physician Assistant | Admitting: Physician Assistant

## 2020-08-01 ENCOUNTER — Other Ambulatory Visit: Payer: Self-pay

## 2020-08-01 ENCOUNTER — Other Ambulatory Visit: Payer: Self-pay | Admitting: Physician Assistant

## 2020-08-01 DIAGNOSIS — Z9189 Other specified personal risk factors, not elsewhere classified: Secondary | ICD-10-CM

## 2020-08-01 DIAGNOSIS — Z803 Family history of malignant neoplasm of breast: Secondary | ICD-10-CM | POA: Diagnosis not present

## 2020-08-01 DIAGNOSIS — Z1239 Encounter for other screening for malignant neoplasm of breast: Secondary | ICD-10-CM

## 2020-08-01 DIAGNOSIS — Z853 Personal history of malignant neoplasm of breast: Secondary | ICD-10-CM | POA: Diagnosis not present

## 2020-08-01 MED ORDER — GADOBUTROL 1 MMOL/ML IV SOLN
10.0000 mL | Freq: Once | INTRAVENOUS | Status: AC | PRN
  Administered 2020-08-01: 10 mL via INTRAVENOUS

## 2020-08-01 NOTE — Progress Notes (Signed)
I called the patient about her MRI results from today. Left a voicemail that the scan did not show any concerning findings. I am placing an order for her MRI of the breast next year in 2023. If she has any questions she can feel free to call the office back.

## 2020-08-02 ENCOUNTER — Other Ambulatory Visit: Payer: Self-pay

## 2020-08-02 ENCOUNTER — Ambulatory Visit (INDEPENDENT_AMBULATORY_CARE_PROVIDER_SITE_OTHER): Payer: BC Managed Care – PPO | Admitting: *Deleted

## 2020-08-02 DIAGNOSIS — Z23 Encounter for immunization: Secondary | ICD-10-CM | POA: Diagnosis not present

## 2020-08-05 NOTE — Addendum Note (Signed)
Addended by: Marrion Coy on: 08/05/2020 01:32 PM   Modules accepted: Orders

## 2020-08-11 NOTE — Progress Notes (Signed)
Chief Complaint  Patient presents with  . Follow-up    Memory thyroid   . neck nodule    HPI: Laura Smith 60 y.o. come in for a couple of issues and follow-up She still have concern about her memory and be able to learn new things is affecting her work over the last 6 months may be.  Still would like a neurology referral Dr. Felecia Jan mildly.  Patiently forgets names but forgetting what she was doing at a given time She is noted since her back surgery nodule in her neck left tender to touch not growing no other. She is taking her thyroid medicine has not been 6 weeks yet.  Has a few questions about the thyroid.  Mom had thyroid  Disease   And had medication . ROS: See pertinent positives and negatives per HPI. Of note on med list is not taking narcotics there left over from previous surgeries headaches have pretty much gone since she has been out of work. No history of sleep apnea per se. Asks about supplement to help lose weight has not started yet. Past Medical History:  Diagnosis Date  . Allergy   . Breast cyst   . Endometriosis    surgery laser 2001  . Family history of breast cancer   . Family history of colon cancer   . Family history of prostate cancer   . Hemorrhoids   . Hx of basal cell carcinoma   . Hypertension   . Migraine   . Thyroid disease   . Urinary tract infection     Family History  Problem Relation Age of Onset  . Breast cancer Mother        Age 29  . Colon cancer Mother 24  . Breast cancer Cousin 28       paternal first cousin  . Heart disease Father 77       Heart attack  . Hypertension Paternal Grandmother   . Heart disease Paternal Grandmother   . Stroke Paternal Grandmother   . Cancer Paternal Grandmother        Lung  . Cancer Paternal Grandfather        Prostate  . Stroke Maternal Grandfather   . COPD Maternal Uncle   . Liver cancer Paternal Uncle   . Congestive Heart Failure Maternal Grandmother   . Hypertension Paternal Aunt   .  Colon polyps Neg Hx   . Stomach cancer Neg Hx   . Rectal cancer Neg Hx   . Esophageal cancer Neg Hx     Social History   Socioeconomic History  . Marital status: Married    Spouse name: Not on file  . Number of children: Not on file  . Years of education: Not on file  . Highest education level: Not on file  Occupational History  . Not on file  Tobacco Use  . Smoking status: Never Smoker  . Smokeless tobacco: Never Used  Vaping Use  . Vaping Use: Never used  Substance and Sexual Activity  . Alcohol use: No    Alcohol/week: 0.0 standard drinks  . Drug use: No  . Sexual activity: Yes    Birth control/protection: Post-menopausal    Comment: Pt declined sexual hx questions  Other Topics Concern  . Not on file  Social History Narrative   h h of 2 Pet cat   Daughter /  Married    g1 p1   Works at [employer]   Financial Resource Strain: Not on file  Food Insecurity: Not on file  Transportation Needs: Not on file  Physical Activity: Not on file  Stress: Not on file  Social Connections: Not on file    Outpatient Medications Prior to Visit  Medication Sig Dispense Refill  . Ascorbic Acid (VITAMIN C) 500 MG CAPS Take 1,000 mg by mouth every morning.     Marland Kitchen b complex vitamins tablet Take 2 tablets by mouth daily.    Marland Kitchen CALCIUM-MAGNESIUM PO Take 3 tablets by mouth daily.     . cetirizine (ZYRTEC) 10 MG tablet Take 10 mg by mouth daily.    . cyclobenzaprine (FLEXERIL) 10 MG tablet Take 10 mg by mouth as needed for muscle spasms.     . hydrochlorothiazide (HYDRODIURIL) 25 MG tablet TAKE 1 TABLET BY MOUTH DAILY 90 tablet 3  . HYDROcodone-acetaminophen (NORCO) 7.5-325 MG per tablet Take 1-2 tablets by mouth every 4 (four) hours as needed for pain (as needed for migraine). Reported on 09/23/2015    . HYDROmorphone (DILAUDID) 4 MG tablet Take 4-8 mg by mouth every 4 (four) hours as needed for pain (rescure for migraine). Reported on 09/23/2015    .  levothyroxine (SYNTHROID) 50 MCG tablet Take 1 tablet (50 mcg total) by mouth daily. 90 tablet 1  . meclizine (ANTIVERT) 25 MG tablet TAKE 2 TABLET (50 MG TOTAL) BY MOUTH 2 (TWO) TIMES DAILY AS NEEDED (FOR TRAVEL). 30 tablet 0  . Misc Natural Products (RED WINE EXTRACT PO) Take by mouth.    . Multiple Vitamins-Iron (MULTIVITAMIN/IRON) TABS Take 1 tablet by mouth daily.    Marland Kitchen nystatin (NYSTATIN) powder Apply topically 4 (four) times daily. As needed for rash 30 g 2  . oxyCODONE-acetaminophen (PERCOCET/ROXICET) 5-325 MG tablet Take 1 tablet by mouth every 4 (four) hours as needed.    . Rimegepant Sulfate (NURTEC) 75 MG TBDP Take 1 tablet by mouth as needed (for migraine). 30 tablet 0  . sulindac (CLINORIL) 200 MG tablet Take 1 tablet (200 mg total) by mouth 2 (two) times daily as needed (headache). Reported on 09/23/2015 30 tablet 3  . SUMAtriptan (IMITREX) 6 MG/0.5ML SOLN injection Inject 6 mg into the skin every 2 (two) hours as needed for migraine. Reported on 09/23/2015    . traMADol (ULTRAM) 50 MG tablet Take 50 mg by mouth as needed for severe pain.     . verapamil (CALAN-SR) 180 MG CR tablet TAKE 1 TABLET BY MOUTH AT BEDTIME 90 tablet 3  . zolmitriptan (ZOMIG ZMT) 5 MG disintegrating tablet Take 1 tablet (5 mg total) by mouth as needed for migraine. May repeat in 2 hours 10 tablet 2   Facility-Administered Medications Prior to Visit  Medication Dose Route Frequency Provider Last Rate Last Admin  . methylPREDNISolone acetate (DEPO-MEDROL) injection 120 mg  120 mg Intramuscular Once Jovan Colligan, [name], MD         EXAM:  BP 128/78 (BP Location: Left Arm, Patient Position: Sitting, Cuff Size: Large)   Pulse 74   Temp 98.1 F (36.7 C) (Oral)   Wt 223 lb (101.2 kg)   SpO2 98%   BMI 34.41 kg/m   Body mass index is 34.41 kg/m.  GENERAL: vitals reviewed and listed above, alert, oriented, appears well hydrated and in no acute distress HEENT: atraumatic, conjunctiva  clear, no obvious  abnormalities on inspection of external nose and ears OP :masked  NECK: thyroid no obv nodule  There is a 1.5 cm smooth  movole nodule left paratracheal area.  No other no adenopathy.  Not seem attached to thyroid. CV: HRRR, no clubbing cyanosis or  peripheral edema nl cap refill  MS: moves all extremities without noticeable focal  abnormality  Lab Results  Component Value Date   WBC 8.9 05/24/2020   HGB 14.9 05/24/2020   HCT 44.7 05/24/2020   PLT 342 05/24/2020   GLUCOSE 98 05/24/2020   CHOL 222 (H) 05/24/2020   TRIG 200 (H) 05/24/2020   HDL 62 05/24/2020   LDLDIRECT 133.8 04/07/2013   LDLCALC 126 (H) 05/24/2020   ALT 24 05/24/2020   AST 25 05/24/2020   NA 137 05/24/2020   K 3.8 05/24/2020   CL 99 05/24/2020   CREATININE 0.94 05/24/2020   BUN 17 05/24/2020   CO2 27 05/24/2020   TSH 6.81 (H) 07/11/2020   HGBA1C 5.6 05/24/2020   BP Readings from Last 3 Encounters:  08/12/20 128/78  07/05/20 137/71  06/28/20 124/82    ASSESSMENT AND PLAN:  Discussed the following assessment and plan:  Neck nodule - Plan: Ambulatory referral to ENT  Thyroid function test abnormal - Plan: Ambulatory referral to ENT  Memory difficulties - Plan: Ambulatory referral to Neurology Discussion about thyroid and effects on body and brain.  I do not think it was abnormal enough to affect her functioning to the degree she describes. Referral to neurology for help and evaluation.  On her memory and thought process  In regard to the nodule on her neck it appears to be superior to the thyroid and not attached For that reason we will do ENT referral for evaluation question if needs CT imaging or other.  It feels benign otherwise.  Have her make appointment for lab work at least 6 to 8 weeks since beginning the thyroid medicine.  See previous note.  FYI to have her colonoscopy soon.-Patient advised to return or notify health care team  if  new concerns arise. Would wait on any weight loss supplements  till after thyroid tests are done are in range.  They are unknowns. Record review counsel explanation plan follow-up referrals 30 minutes Patient Instructions  Will do  Referral  Neurology   And  ENT  nodule  .   This may  Be a gland and is probably benign .  Continue thyroid medication as planned .  And follow up.     [name]. Gid Schoffstall M.D.

## 2020-08-12 ENCOUNTER — Encounter: Payer: Self-pay | Admitting: Internal Medicine

## 2020-08-12 ENCOUNTER — Other Ambulatory Visit: Payer: Self-pay

## 2020-08-12 ENCOUNTER — Ambulatory Visit: Payer: BC Managed Care – PPO | Admitting: Internal Medicine

## 2020-08-12 ENCOUNTER — Encounter: Payer: Self-pay | Admitting: Gastroenterology

## 2020-08-12 VITALS — BP 128/78 | HR 74 | Temp 98.1°F | Wt 223.0 lb

## 2020-08-12 DIAGNOSIS — R413 Other amnesia: Secondary | ICD-10-CM | POA: Diagnosis not present

## 2020-08-12 DIAGNOSIS — R946 Abnormal results of thyroid function studies: Secondary | ICD-10-CM

## 2020-08-12 DIAGNOSIS — R221 Localized swelling, mass and lump, neck: Secondary | ICD-10-CM | POA: Diagnosis not present

## 2020-08-12 NOTE — Patient Instructions (Addendum)
Will do  Referral  Neurology   And  ENT  nodule  .   This may  Be a gland and is probably benign .  Continue thyroid medication as planned .  And follow up.

## 2020-08-14 ENCOUNTER — Ambulatory Visit (AMBULATORY_SURGERY_CENTER): Payer: BC Managed Care – PPO | Admitting: Gastroenterology

## 2020-08-14 ENCOUNTER — Encounter: Payer: Self-pay | Admitting: Gastroenterology

## 2020-08-14 ENCOUNTER — Other Ambulatory Visit: Payer: Self-pay

## 2020-08-14 VITALS — BP 108/75 | HR 72 | Temp 98.6°F | Resp 19 | Ht 67.5 in | Wt 214.0 lb

## 2020-08-14 DIAGNOSIS — Z800 Family history of malignant neoplasm of digestive organs: Secondary | ICD-10-CM | POA: Diagnosis not present

## 2020-08-14 DIAGNOSIS — Z86010 Personal history of colonic polyps: Secondary | ICD-10-CM

## 2020-08-14 DIAGNOSIS — D124 Benign neoplasm of descending colon: Secondary | ICD-10-CM

## 2020-08-14 DIAGNOSIS — Z1211 Encounter for screening for malignant neoplasm of colon: Secondary | ICD-10-CM | POA: Diagnosis not present

## 2020-08-14 DIAGNOSIS — D120 Benign neoplasm of cecum: Secondary | ICD-10-CM | POA: Diagnosis not present

## 2020-08-14 MED ORDER — SODIUM CHLORIDE 0.9 % IV SOLN: 500.0000 mL | INTRAVENOUS | Status: DC

## 2020-08-14 NOTE — Patient Instructions (Signed)
Please read handouts provided. Continue present medications. Await pathology results.   YOU HAD AN ENDOSCOPIC PROCEDURE TODAY AT THE [facility]:   Refer to the procedure report that was given to you for any specific questions about what was found during the examination.  If the procedure report does not answer your questions, please call your gastroenterologist to clarify.  If you requested that your care partner not be given the details of your procedure findings, then the procedure report has been included in a sealed envelope for you to review at your convenience later.  YOU SHOULD EXPECT: Some feelings of bloating in the abdomen. Passage of more gas than usual.  Walking can help get rid of the air that was put into your GI tract during the procedure and reduce the bloating. If you had a lower endoscopy (such as a colonoscopy or flexible sigmoidoscopy) you may notice spotting of blood in your stool or on the toilet paper. If you underwent a bowel prep for your procedure, you may not have a normal bowel movement for a few days.  Please Note:  You might notice some irritation and congestion in your nose or some drainage.  This is from the oxygen used during your procedure.  There is no need for concern and it should clear up in a day or so.  SYMPTOMS TO REPORT IMMEDIATELY:  Following lower endoscopy (colonoscopy or flexible sigmoidoscopy):  Excessive amounts of blood in the stool  Significant tenderness or worsening of abdominal pains  Swelling of the abdomen that is new, acute  Fever of 100F or higher   For urgent or emergent issues, a gastroenterologist can be reached at any hour by calling [phone]. Do not use MyChart messaging for urgent concerns.    DIET:  We do recommend a small meal at first, but then you may proceed to your regular diet.  Drink plenty of fluids but you should avoid alcoholic beverages for 24 hours.  ACTIVITY:  You should plan to take it easy  for the rest of today and you should NOT DRIVE or use heavy machinery until tomorrow (because of the sedation medicines used during the test).    FOLLOW UP: Our staff will call the number listed on your records 48-72 hours following your procedure to check on you and address any questions or concerns that you may have regarding the information given to you following your procedure. If we do not reach you, we will leave a message.  We will attempt to reach you two times.  During this call, we will ask if you have developed any symptoms of COVID 19. If you develop any symptoms (ie: fever, flu-like symptoms, shortness of breath, cough etc.) before then, please call [phone].  If you test positive for Covid 19 in the 2 weeks post procedure, please call and report this information to us.    If any biopsies were taken you will be contacted by phone or by letter within the next 1-3 weeks.  Please call us at [phone] if you have not heard about the biopsies in 3 weeks.    SIGNATURES/CONFIDENTIALITY: You and/or your care partner have signed paperwork which will be entered into your electronic medical record.  These signatures attest to the fact that that the information above on your After Visit Summary has been reviewed and is understood.  Full responsibility of the confidentiality of this discharge information lies with you and/or your care-partner.  

## 2020-08-14 NOTE — Progress Notes (Signed)
To pacu, VSS. Report to [email or web address] 

## 2020-08-14 NOTE — Progress Notes (Signed)
Called to room to assist during endoscopic procedure.  Patient ID and intended procedure confirmed with present staff. Received instructions for my participation in the procedure from the performing physician.  

## 2020-08-14 NOTE — Op Note (Signed)
[facility] Patient Name: Nadalie Laughner Procedure Date: 08/14/2020 9:08 AM MRN: [id number] Endoscopist: Ladene Artist , MD Age: 60 Referring MD:  Date of Birth: [birth year] Gender: Female Account #: [id number] Procedure:                Colonoscopy Indications:              Surveillance: Personal history of adenomatous                            polyps and SSP on last colonoscopy > 5 years ago.                            Family history of colon cancer, first degree                            relative. Medicines:                Monitored Anesthesia Care Procedure:                Pre-Anesthesia Assessment:                           - Prior to the procedure, a History and Physical                            was performed, and patient medications and                            allergies were reviewed. The patient's tolerance of                            previous anesthesia was also reviewed. The risks                            and benefits of the procedure and the sedation                            options and risks were discussed with the patient.                            All questions were answered, and informed consent                            was obtained. Prior Anticoagulants: The patient has                            taken no previous anticoagulant or antiplatelet                            agents. ASA Grade Assessment: II - A patient with                            mild systemic disease. After reviewing the risks  and benefits, the patient was deemed in                            satisfactory condition to undergo the procedure.                           After obtaining informed consent, the colonoscope                            was passed under direct vision. Throughout the                            procedure, the patient's blood pressure, pulse, and                            oxygen saturations were monitored continuously. The                             Olympus CF-HQ190L (Serial# [id number]) Colonoscope was                            introduced through the anus with the intention of                            advancing to the cecum. The scope was advanced to                            the sigmoid colon. The Olympus [id number]-[id number]                            (#[id number]) Colonoscope was introduced through the                            anus with the intention of advancing to the cecum.                            The scope was advanced to the sigmoid colon . The                            Olympus (#[id number]) upper endoscope was introduced                            through the anus and advanced to the cecum. The                            ileocecal valve, appendiceal orifice, and rectum                            were photographed. The quality of the bowel                            preparation was good. The colonoscopy was somewhat  difficult due to restricted mobility of the sigmoid                            colon. Successful completion of the procedure was                            aided by withdrawing the scope and replacing with                            the adult endoscope. The patient tolerated the                            procedure well. Scope In: 9:14:10 AM Scope Out: 9:44:46 AM Scope Withdrawal Time: 0 hours 12 minutes 24 seconds  Total Procedure Duration: 0 hours 30 minutes 36 seconds  Findings:                 Skin tags were found on perianal exam.                           Two sessile polyps were found in the descending                            colon and cecum. The polyps were 6 to 7 mm in size.                            These polyps were removed with a cold snare.                            Resection and retrieval were complete.                           A single small localized angiodysplastic lesion                            without bleeding was found in the cecum.                            Anal papilla(e) were hypertrophied.                           Internal hemorrhoids were found during                            retroflexion. The hemorrhoids were small and Grade                            I (internal hemorrhoids that do not prolapse).                           The exam was otherwise without abnormality on                            direct and retroflexion views. Complications:  No immediate complications. Estimated blood loss:                            None. Estimated Blood Loss:     Estimated blood loss: none. Impression:               - Perianal skin tags found on perianal exam.                           - Two 6 to 7 mm polyps in the descending colon and                            in the cecum, removed with a cold snare. Resected                            and retrieved.                           - Single small nonbleeding cecal angiodysplastic                            lesion.                           - Anal papilla(e) were hypertrophied.                           - Internal hemorrhoids.                           - The examination was otherwise normal on direct                            and retroflexion views. Recommendation:           - Repeat colonoscopy, likely in 5 years, for                            surveillance based on pathology results.                           - Patient has a contact number available for                            emergencies. The signs and symptoms of potential                            delayed complications were discussed with the                            patient. Return to normal activities tomorrow.                            Written discharge instructions were provided to the  patient.                           - Resume previous diet.                           - Continue present medications.                           - Await pathology results. Ladene Artist, MD 08/14/2020 9:55:42  AM This report has been signed electronically.

## 2020-08-14 NOTE — Progress Notes (Signed)
Vitals by CW 

## 2020-08-16 ENCOUNTER — Telehealth: Payer: Self-pay

## 2020-08-16 ENCOUNTER — Encounter: Payer: Self-pay | Admitting: Obstetrics and Gynecology

## 2020-08-16 ENCOUNTER — Other Ambulatory Visit: Payer: Self-pay

## 2020-08-16 ENCOUNTER — Ambulatory Visit: Payer: BC Managed Care – PPO | Admitting: Obstetrics and Gynecology

## 2020-08-16 VITALS — BP 120/80

## 2020-08-16 DIAGNOSIS — N3001 Acute cystitis with hematuria: Secondary | ICD-10-CM | POA: Diagnosis not present

## 2020-08-16 DIAGNOSIS — R350 Frequency of micturition: Secondary | ICD-10-CM

## 2020-08-16 MED ORDER — CIPROFLOXACIN HCL 500 MG PO TABS: 500.0000 mg | ORAL_TABLET | Freq: Two times a day (BID) | ORAL | 0 refills | Status: DC

## 2020-08-16 NOTE — Progress Notes (Signed)
BRAYLEN DENUNZIO 1961-04-28 [id number]  SUBJECTIVE:  60 y.o. G24P1001 female presents for evaluation of painful urination and increased urinary frequency, also noting blood in urine and soreness in the pelvic area.  Symptoms started this morning upon awakening.  Feeling some chills.  Also with some mild low back pains.  She says she has had UTIs and kidney infections in the past with similar symptoms.  No demonstrated fever. No vaginal discharge.   Current Outpatient Medications  Medication Sig Dispense Refill  . Ascorbic Acid (VITAMIN C) 500 MG CAPS Take 1,000 mg by mouth every morning.     Marland Kitchen b complex vitamins tablet Take 2 tablets by mouth daily.    Marland Kitchen CALCIUM-MAGNESIUM PO Take 3 tablets by mouth daily.     . cetirizine (ZYRTEC) 10 MG tablet Take 10 mg by mouth daily.    . cyclobenzaprine (FLEXERIL) 10 MG tablet Take 10 mg by mouth as needed for muscle spasms.     . hydrochlorothiazide (HYDRODIURIL) 25 MG tablet TAKE 1 TABLET BY MOUTH DAILY 90 tablet 3  . HYDROcodone-acetaminophen (NORCO) 7.5-325 MG per tablet Take 1-2 tablets by mouth every 4 (four) hours as needed for pain (as needed for migraine). Reported on 09/23/2015    . HYDROmorphone (DILAUDID) 4 MG tablet Take 4-8 mg by mouth every 4 (four) hours as needed for pain (rescure for migraine). Reported on 09/23/2015    . levothyroxine (SYNTHROID) 50 MCG tablet Take 1 tablet (50 mcg total) by mouth daily. 90 tablet 1  . meclizine (ANTIVERT) 25 MG tablet TAKE 2 TABLET (50 MG TOTAL) BY MOUTH 2 (TWO) TIMES DAILY AS NEEDED (FOR TRAVEL). 30 tablet 0  . Misc Natural Products (RED WINE EXTRACT PO) Take by mouth.    . Multiple Vitamins-Iron (MULTIVITAMIN/IRON) TABS Take 1 tablet by mouth daily.    Marland Kitchen nystatin (NYSTATIN) powder Apply topically 4 (four) times daily. As needed for rash 30 g 2  . oxyCODONE-acetaminophen (PERCOCET/ROXICET) 5-325 MG tablet Take 1 tablet by mouth every 4 (four) hours as needed.    . Rimegepant Sulfate (NURTEC) 75 MG TBDP  Take 1 tablet by mouth as needed (for migraine). 30 tablet 0  . sulindac (CLINORIL) 200 MG tablet Take 1 tablet (200 mg total) by mouth 2 (two) times daily as needed (headache). Reported on 09/23/2015 30 tablet 3  . SUMAtriptan (IMITREX) 6 MG/0.5ML SOLN injection Inject 6 mg into the skin every 2 (two) hours as needed for migraine. Reported on 09/23/2015    . traMADol (ULTRAM) 50 MG tablet Take 50 mg by mouth as needed for severe pain.     . verapamil (CALAN-SR) 180 MG CR tablet TAKE 1 TABLET BY MOUTH AT BEDTIME 90 tablet 3  . zolmitriptan (ZOMIG ZMT) 5 MG disintegrating tablet Take 1 tablet (5 mg total) by mouth as needed for migraine. May repeat in 2 hours 10 tablet 2   Current Facility-Administered Medications  Medication Dose Route Frequency Provider Last Rate Last Admin  . methylPREDNISolone acetate (DEPO-MEDROL) injection 120 mg  120 mg Intramuscular Once Panosh, [name], MD       Allergies: Etodolac, Latex, Other, and Adhesive [tape]  No LMP recorded. Patient is postmenopausal.  Past medical history,surgical history, problem list, medications, allergies, family history and social history were all reviewed and documented as reviewed in the EPIC chart.  ROS: Positives and negatives as discussed in HPI    OBJECTIVE:  BP 120/80 (BP Location: Right Arm, Patient Position: Sitting, Cuff Size: Normal)  The  patient appears well, alert, oriented, in no distress. PELVIC EXAM: Deferred Urinalysis 40-60 WBC, 3-10 RBC, 0-5 squamous epithelial cells, moderate bacteria, 2+ leukocyte esterase, negative nitrite, yellow and clear   ASSESSMENT:  60 y.o. G1P1001 here for presumed acute cystitis  PLAN:  Ciprofloxacin 500 mg twice daily for 7 days as she is having some back pain and concern for early pyelonephritis, also based on her symptoms of chills.  Will look for urine culture and sensitivity results when available.  If symptoms acutely worsen then she should go to and for urgent  evaluation.   Joseph Pierini MD 08/16/20

## 2020-08-16 NOTE — Telephone Encounter (Signed)
  Follow up Call-  Call back number 08/14/2020  Post procedure Call Back phone  # [phone]  Permission to leave phone message Yes  Some recent data might be hidden     Patient questions:  Do you have a fever, pain , or abdominal swelling? No. Pain Score  0 *  Have you tolerated food without any problems? Yes.    Have you been able to return to your normal activities? Yes.    Do you have any questions about your discharge instructions: Diet   No. Medications  No. Follow up visit  No.  Do you have questions or concerns about your Care? No.  Actions: * If pain score is 4 or above: No action needed, pain <4. 1. Have you developed a fever since your procedure? no  2.   Have you had an respiratory symptoms (SOB or cough) since your procedure? no  3.   Have you tested positive for COVID 19 since your procedure no  4.   Have you had any family members/close contacts diagnosed with the COVID 19 since your procedure?  no   If yes to any of these questions please route to Joylene John, RN and Joella Prince, RN

## 2020-08-19 LAB — CULTURE INDICATED

## 2020-08-19 LAB — URINALYSIS, COMPLETE W/RFL CULTURE
Bilirubin Urine: NEGATIVE
Glucose, UA: NEGATIVE
Hyaline Cast: NONE SEEN /LPF
Ketones, ur: NEGATIVE
Nitrites, Initial: NEGATIVE
Protein, ur: NEGATIVE
Specific Gravity, Urine: 1.01 (ref 1.001–1.03)
pH: 6.5 (ref 5.0–8.0)

## 2020-08-19 LAB — URINE CULTURE
MICRO NUMBER:: 11580096
SPECIMEN QUALITY:: ADEQUATE

## 2020-08-20 ENCOUNTER — Encounter: Payer: Self-pay | Admitting: Obstetrics and Gynecology

## 2020-08-20 NOTE — Progress Notes (Signed)
Thank you :)

## 2020-08-23 ENCOUNTER — Encounter: Payer: BC Managed Care – PPO | Admitting: Gastroenterology

## 2020-08-28 ENCOUNTER — Encounter: Payer: Self-pay | Admitting: Gastroenterology

## 2020-08-28 ENCOUNTER — Telehealth: Payer: Self-pay

## 2020-08-28 NOTE — Telephone Encounter (Signed)
I called patient to read her Dr. Scarlette Ar unread My Chart message.   " Laura Smith, I just want to check in with you to make sure you are feeling better after starting the antibiotic. Urine culture showed that the antibiotic I prescribed you should work well. Thanks, ...  Written by Joseph Pierini, MD on 08/20/2020   Patient said her back still hurts a little but that might be from her back surgery. Hard to tell. I offered her to come by for lab appointment and leave a urine specimen to recheck and make sure the antibiotic cleared infection.  I told her I would need to check with physician for order. Lab appt scheduled for tomorrow at 2:30pm.

## 2020-08-29 ENCOUNTER — Other Ambulatory Visit (INDEPENDENT_AMBULATORY_CARE_PROVIDER_SITE_OTHER): Payer: BC Managed Care – PPO

## 2020-08-29 ENCOUNTER — Ambulatory Visit (INDEPENDENT_AMBULATORY_CARE_PROVIDER_SITE_OTHER): Payer: BC Managed Care – PPO | Admitting: Otolaryngology

## 2020-08-29 ENCOUNTER — Other Ambulatory Visit: Payer: Self-pay

## 2020-08-29 DIAGNOSIS — M549 Dorsalgia, unspecified: Secondary | ICD-10-CM

## 2020-08-29 DIAGNOSIS — I881 Chronic lymphadenitis, except mesenteric: Secondary | ICD-10-CM

## 2020-08-29 DIAGNOSIS — Q892 Congenital malformations of other endocrine glands: Secondary | ICD-10-CM

## 2020-08-29 NOTE — Telephone Encounter (Signed)
Order placed

## 2020-08-29 NOTE — Telephone Encounter (Signed)
I agree with urine check.  Please place order for urinalysis with microscopic exam and reflex culture.

## 2020-08-29 NOTE — Progress Notes (Signed)
HPI: Laura Smith is a 60 y.o. female who presents is referred by her PCP Dr. Regis Bill for evaluation of neck nodule.  She first noticed this after undergoing general endotracheal anesthesia for back surgery several months ago.  It does not seem to give her much problems although it is a little sore when she palpates the small nodule.  The region of the nodule is in the area of the upper larynx more on the left side.  I reviewed a CT scan of her chest performed in January 2021 that showed essentially normal-appearing thyroid glands bilaterally.  Past Medical History:  Diagnosis Date  . Allergy   . Breast cyst   . Endometriosis    surgery laser 2001  . Family history of breast cancer   . Family history of colon cancer   . Family history of prostate cancer   . Hemorrhoids   . Hx of basal cell carcinoma   . Hypertension   . Migraine   . Thyroid disease   . Urinary tract infection    Past Surgical History:  Procedure Laterality Date  . APPENDECTOMY  1992   AT C/S  . BACK SURGERY  06/06/2020   L3-4  . CESAREAN SECTION  1992   APPENDECTOMY SAME TIME  . COLONOSCOPY  2015  . HYSTEROSCOPY  08/1999   AT TIME OF LAPAROSCOPY  . PELVIC LAPAROSCOPY  08/1999   AND HYSTEROSCOPY--ENDOMETRIOSIS  . POLYPECTOMY     Social History   Socioeconomic History  . Marital status: Married    Spouse name: Not on file  . Number of children: Not on file  . Years of education: Not on file  . Highest education level: Not on file  Occupational History  . Not on file  Tobacco Use  . Smoking status: Never Smoker  . Smokeless tobacco: Never Used  Vaping Use  . Vaping Use: Never used  Substance and Sexual Activity  . Alcohol use: No    Alcohol/week: 0.0 standard drinks  . Drug use: No  . Sexual activity: Yes    Birth control/protection: Post-menopausal    Comment: Pt declined sexual hx questions  Other Topics Concern  . Not on file  Social History Narrative   h h of 2 Pet cat   Daughter /   Married    g1 p1   Works at [employer] Strain: Not on [employer] Insecurity: Not on file  Transportation Needs: Not on file  Physical Activity: Not on file  Stress: Not on file  Social Connections: Not on file   Family History  Problem Relation Age of Onset  . Breast cancer Mother        Age 22  . Colon cancer Mother 23  . Breast cancer Cousin 15       paternal first cousin  . Heart disease Father 4       Heart attack  . Hypertension Paternal Grandmother   . Heart disease Paternal Grandmother   . Stroke Paternal Grandmother   . Cancer Paternal Grandmother        Lung  . Cancer Paternal Grandfather        Prostate  . Stroke Maternal Grandfather   . COPD Maternal Uncle   . Liver cancer Paternal Uncle   . Congestive Heart Failure Maternal Grandmother   . Hypertension Paternal Aunt   . Colon polyps Neg Hx   . Stomach cancer Neg Hx   .  Rectal cancer Neg Hx   . Esophageal cancer Neg Hx    Allergies  Allergen Reactions  . Etodolac Hives    Possible anaphylaxis  With vomiting and throat feeling tight  ED visit 8 14   . Latex Hives, Rash and Other (See Comments)    Causes blisters  . Other Hives, Rash and Other (See Comments)    Gel used for ultrasound-causes blisters also   . Adhesive [Tape] Itching and Rash   Prior to Admission medications   Medication Sig Start Date End Date Taking? Authorizing Provider  Ascorbic Acid (VITAMIN C) 500 MG CAPS Take 1,000 mg by mouth every morning.     [provider]  b complex vitamins tablet Take 2 tablets by mouth daily.    [provider]  CALCIUM-MAGNESIUM PO Take 3 tablets by mouth daily.     [provider]  cetirizine (ZYRTEC) 10 MG tablet Take 10 mg by mouth daily.    [provider]  ciprofloxacin (CIPRO) 500 MG tablet Take 1 tablet (500 mg total) by mouth 2 (two) times daily. 08/16/20   Joseph Pierini, MD  cyclobenzaprine  (FLEXERIL) 10 MG tablet Take 10 mg by mouth as needed for muscle spasms.  01/25/17   [provider]  hydrochlorothiazide (HYDRODIURIL) 25 MG tablet TAKE 1 TABLET BY MOUTH DAILY 06/03/20   Panosh, [name], MD  HYDROcodone-acetaminophen (NORCO) 7.5-325 MG per tablet Take 1-2 tablets by mouth every 4 (four) hours as needed for pain (as needed for migraine). Reported on 09/23/2015    Michel Santee, MD  HYDROmorphone (DILAUDID) 4 MG tablet Take 4-8 mg by mouth every 4 (four) hours as needed for pain (rescure for migraine). Reported on 09/23/2015    Michel Santee, MD  levothyroxine (SYNTHROID) 50 MCG tablet Take 1 tablet (50 mcg total) by mouth daily. 07/11/20   Panosh, [name], MD  meclizine (ANTIVERT) 25 MG tablet TAKE 2 TABLET (50 MG TOTAL) BY MOUTH 2 (TWO) TIMES DAILY AS NEEDED (FOR TRAVEL). 12/07/19   Panosh, [name], MD  Misc Natural Products (RED WINE EXTRACT PO) Take by mouth.    [provider]  Multiple Vitamins-Iron (MULTIVITAMIN/IRON) TABS Take 1 tablet by mouth daily.    [provider]  nystatin (NYSTATIN) powder Apply topically 4 (four) times daily. As needed for rash 11/22/18   Fontaine, Belinda Block, MD  oxyCODONE-acetaminophen (PERCOCET/ROXICET) 5-325 MG tablet Take 1 tablet by mouth every 4 (four) hours as needed. 06/06/20   [provider]  Rimegepant Sulfate (NURTEC) 75 MG TBDP Take 1 tablet by mouth as needed (for migraine). 05/24/20   Panosh, [name], MD  sulindac (CLINORIL) 200 MG tablet Take 1 tablet (200 mg total) by mouth 2 (two) times daily as needed (headache). Reported on 09/23/2015 05/22/19   Panosh, [name], MD  SUMAtriptan (IMITREX) 6 MG/0.5ML SOLN injection Inject 6 mg into the skin every 2 (two) hours as needed for migraine. Reported on 09/23/2015    [provider]  traMADol (ULTRAM) 50 MG tablet Take 50 mg by mouth as needed for severe pain.  01/19/17   [provider]  verapamil (CALAN-SR) 180 MG CR tablet TAKE 1 TABLET BY MOUTH AT  BEDTIME 06/03/20   Panosh, [name], MD  zolmitriptan (ZOMIG ZMT) 5 MG disintegrating tablet Take 1 tablet (5 mg total) by mouth as needed for migraine. May repeat in 2 hours 05/22/19   Panosh, [name], MD     Positive ROS: Otherwise negative  All other systems have been reviewed and were otherwise negative with the exception of those mentioned in the HPI and as above.  Physical Exam: Constitutional: Alert, well-appearing, no acute distress Ears: External ears without lesions or tenderness. Ear canals are clear bilaterally with intact, clear TMs.  Nasal: External nose without lesions. Septum with minimal deformity. Clear nasal passages Oral: Lips and gums without lesions. Tongue and palate mucosa without lesions. Posterior oropharynx clear.  Indirect laryngoscopy revealed a clear base of tongue vallecula and epiglottis.  Piriform sinuses were clear.  Vocal cords were clear bilaterally with normal vocal mobility.  Palpation of the base of tongue was soft. Neck: On neck examination patient has a small nodule just left of midline on the upper edge of the laryngeal cartilage.  This is approximately 10 to 14 mm in size and is slightly tender.  Consistent with possible lymph node versus thyroglossal duct cyst.  No palpable adenopathy along the jugular chain of lymph nodes on either side or supraclavicular area.  No significant thyroid nodules or masses noted. Respiratory: Breathing comfortably  Skin: No facial/neck lesions or rash noted.  Procedures  Assessment: Neck nodule most likely represents inflamed lymph node versus possible thyroglossal duct cyst.  Plan: I discussed whether that this appears benign with normal upper airway examination. I prescribed Keflex 500 mg twice daily for 10 days to take if it becomes more painful or enlarges.  If it persists or enlarges would recommend further evaluation with a CT scan of the neck and discussed this with her.  She will call us back to schedule this  if needed.  Even if it is a thyroglossal duct cyst would not necessarily recommend removal of this unless it gave her problems.   Radene Journey, MD   CC:

## 2020-09-01 LAB — URINALYSIS, COMPLETE W/RFL CULTURE
Bacteria, UA: NONE SEEN /HPF
Bilirubin Urine: NEGATIVE
Glucose, UA: NEGATIVE
Hgb urine dipstick: NEGATIVE
Hyaline Cast: NONE SEEN /LPF
Ketones, ur: NEGATIVE
Leukocyte Esterase: NEGATIVE
Nitrites, Initial: NEGATIVE
Protein, ur: NEGATIVE
Specific Gravity, Urine: 1.015 (ref 1.001–1.03)
Squamous Epithelial / HPF: NONE SEEN /HPF (ref <=5)
WBC, UA: NONE SEEN /HPF (ref 0–5)
pH: 6 (ref 5.0–8.0)

## 2020-09-01 LAB — URINE CULTURE
MICRO NUMBER:: 11635453
SPECIMEN QUALITY:: ADEQUATE

## 2020-09-01 LAB — CULTURE INDICATED

## 2020-09-12 ENCOUNTER — Telehealth: Payer: Self-pay

## 2020-09-12 NOTE — Telephone Encounter (Signed)
Spoke with Laura Smith at [facility] at she stated that the patients prior authorization for Nurtec 75mg  has been approved and that she will notify the patient once the medication becomes available.

## 2020-09-13 ENCOUNTER — Other Ambulatory Visit: Payer: Self-pay

## 2020-09-13 ENCOUNTER — Other Ambulatory Visit (INDEPENDENT_AMBULATORY_CARE_PROVIDER_SITE_OTHER): Payer: BC Managed Care – PPO

## 2020-09-13 DIAGNOSIS — E039 Hypothyroidism, unspecified: Secondary | ICD-10-CM | POA: Diagnosis not present

## 2020-09-13 DIAGNOSIS — Z8349 Family history of other endocrine, nutritional and metabolic diseases: Secondary | ICD-10-CM

## 2020-09-13 LAB — TSH: TSH: 0.83 u[IU]/mL (ref 0.35–4.50)

## 2020-09-13 LAB — T4, FREE: Free T4: 0.96 ng/dL (ref 0.60–1.60)

## 2020-09-14 NOTE — Progress Notes (Signed)
Thyroid tests now in normal range   continue same medication levothyroxine  50 mcg per day   can recheck at her next yearlhy lab  .Marland KitchenFYI ok to take biotin  but dont take 1-2 days before thyroid testing

## 2020-09-17 NOTE — Progress Notes (Signed)
Ok to do in 6 months  to ensure stability  otherwise yearly.  Please place order for tsh  so she can make appt for 6 mos

## 2020-09-18 NOTE — Addendum Note (Signed)
Addended by: Nilda Riggs on: 09/18/2020 08:55 AM   Modules accepted: Orders

## 2020-09-18 NOTE — Telephone Encounter (Signed)
If stable lab, can be checked yearly but if there is change in health status interfering medications labs can be done more frequently to ensure stability This is an individual decision based on a given patient. If dosages are changed it takes 2 to 3 months to get a certain steady state with thyroid medicine.  Let our assistants know where you want the medication sent a can be done locally or to your mail away if you wish.

## 2020-09-24 ENCOUNTER — Encounter: Payer: Self-pay | Admitting: Diagnostic Neuroimaging

## 2020-09-24 ENCOUNTER — Other Ambulatory Visit: Payer: Self-pay

## 2020-09-24 ENCOUNTER — Ambulatory Visit: Payer: BC Managed Care – PPO | Admitting: Diagnostic Neuroimaging

## 2020-09-24 VITALS — BP 139/77 | HR 81 | Ht 67.5 in | Wt 222.0 lb

## 2020-09-24 DIAGNOSIS — R413 Other amnesia: Secondary | ICD-10-CM | POA: Diagnosis not present

## 2020-09-24 NOTE — Patient Instructions (Signed)
MILD MEMORY LOSS - check MRI brain, neuropsych testing, B12 - daily physical activity / exercise (at least 15-30 minutes) - eat more plants / vegetables - increase social activities, brain stimulation, games, puzzles, hobbies, crafts, arts, music - aim for at least 7-8 hours sleep per night (or more) - avoid smoking and alcohol - caregiver resources provided - caution with medications, finances, driving

## 2020-09-24 NOTE — Progress Notes (Signed)
[facility]  PATIENT: Laura Smith DOB: 1961-01-05  REFERRING CLINICIAN: Panosh, [name], MD HISTORY FROM: Patient REASON FOR VISIT: New consult   HISTORICAL  CHIEF COMPLAINT:  Chief Complaint  Patient presents with  . Memory difficulties    Rm 6 New Pt  MMSE 29    HISTORY OF PRESENT ILLNESS:   60 year old female here for evaluation memory loss.  Patient has been working at current company for more than 20 years.  She was using a particular type of software and job description for many years.  In May 2021 a transition to a new software and patient began training to learn the new process.  She tried from May until September 2021 but had quite difficult time learning the new routine.  She was surprised that she was unable to learn this.  She mentioned that she had some difficulty learning because she was being taught by 3 different people who contradicted she is other on instructions.  Previously she was considered a superuser of the old software system.  She did not get selected to be super user for the new system.  She is also having some problems with word finding difficulties, short-term memory loss, difficulty staying focused.  No major changes in ADLs.  Patient has been out of work since November 2021 due to back pain issues as well.    REVIEW OF SYSTEMS: Full 14 system review of systems performed and negative with exception of: as per HPI.  ALLERGIES: Allergies  Allergen Reactions  . Etodolac Hives    Possible anaphylaxis  With vomiting and throat feeling tight  ED visit 8 14   . Latex Hives, Rash and Other (See Comments)    Causes blisters  . Other Hives, Rash and Other (See Comments)    Gel used for ultrasound-causes blisters also   . Adhesive [Tape] Itching and Rash    HOME MEDICATIONS: Outpatient Medications Prior to Visit  Medication Sig Dispense Refill  . Ascorbic Acid (VITAMIN C) 500 MG CAPS Take 1,000 mg by mouth every morning.      Marland Kitchen b complex vitamins tablet Take 2 tablets by mouth daily.    Marland Kitchen CALCIUM-MAGNESIUM PO Take 3 tablets by mouth daily.     . cetirizine (ZYRTEC) 10 MG tablet Take 10 mg by mouth daily.    . ciprofloxacin (CIPRO) 500 MG tablet Take 1 tablet (500 mg total) by mouth 2 (two) times daily. 14 tablet 0  . cyclobenzaprine (FLEXERIL) 10 MG tablet Take 10 mg by mouth as needed for muscle spasms.     . hydrochlorothiazide (HYDRODIURIL) 25 MG tablet TAKE 1 TABLET BY MOUTH DAILY 90 tablet 3  . HYDROcodone-acetaminophen (NORCO) 7.5-325 MG per tablet Take 1-2 tablets by mouth every 4 (four) hours as needed for pain (as needed for migraine). Reported on 09/23/2015    . HYDROmorphone (DILAUDID) 4 MG tablet Take 4-8 mg by mouth every 4 (four) hours as needed for pain (rescure for migraine). Reported on 09/23/2015    . levothyroxine (SYNTHROID) 50 MCG tablet Take 1 tablet (50 mcg total) by mouth daily. 90 tablet 1  . meclizine (ANTIVERT) 25 MG tablet TAKE 2 TABLET (50 MG TOTAL) BY MOUTH 2 (TWO) TIMES DAILY AS NEEDED (FOR TRAVEL). 30 tablet 0  . Misc Natural Products (RED WINE EXTRACT PO) Take by mouth.    . Multiple Vitamins-Iron (MULTIVITAMIN/IRON) TABS Take 1 tablet by mouth daily.    Marland Kitchen nystatin (NYSTATIN) powder Apply topically 4 (four) times  daily. As needed for rash 30 g 2  . oxyCODONE-acetaminophen (PERCOCET/ROXICET) 5-325 MG tablet Take 1 tablet by mouth every 4 (four) hours as needed.    . Rimegepant Sulfate (NURTEC) 75 MG TBDP Take 1 tablet by mouth as needed (for migraine). 30 tablet 0  . sulindac (CLINORIL) 200 MG tablet Take 1 tablet (200 mg total) by mouth 2 (two) times daily as needed (headache). Reported on 09/23/2015 30 tablet 3  . SUMAtriptan (IMITREX) 6 MG/0.5ML SOLN injection Inject 6 mg into the skin every 2 (two) hours as needed for migraine. Reported on 09/23/2015    . traMADol (ULTRAM) 50 MG tablet Take 50 mg by mouth as needed for severe pain.     . verapamil (CALAN-SR) 180 MG CR tablet TAKE 1 TABLET  BY MOUTH AT BEDTIME 90 tablet 3  . zolmitriptan (ZOMIG ZMT) 5 MG disintegrating tablet Take 1 tablet (5 mg total) by mouth as needed for migraine. May repeat in 2 hours 10 tablet 2   Facility-Administered Medications Prior to Visit  Medication Dose Route Frequency Provider Last Rate Last Admin  . methylPREDNISolone acetate (DEPO-MEDROL) injection 120 mg  120 mg Intramuscular Once Panosh, [name], MD        PAST MEDICAL HISTORY: Past Medical History:  Diagnosis Date  . Allergy   . Breast cyst   . Endometriosis    surgery laser 2001  . Family history of breast cancer   . Family history of colon cancer   . Family history of prostate cancer   . Hemorrhoids   . Hx of basal cell carcinoma   . Hypertension   . Migraine   . Thyroid disease   . Urinary tract infection     PAST SURGICAL HISTORY: Past Surgical History:  Procedure Laterality Date  . APPENDECTOMY  1992   AT C/S  . BACK SURGERY  06/06/2020   L3-4  . CESAREAN SECTION  1992   APPENDECTOMY SAME TIME  . COLONOSCOPY  2015  . HYSTEROSCOPY  08/1999   AT TIME OF LAPAROSCOPY  . PELVIC LAPAROSCOPY  08/1999   AND HYSTEROSCOPY--ENDOMETRIOSIS  . POLYPECTOMY      FAMILY HISTORY: Family History  Problem Relation Age of Onset  . Breast cancer Mother        Age 84  . Colon cancer Mother 69  . Breast cancer Cousin 33       paternal first cousin  . Heart disease Father 78       Heart attack  . Hypertension Paternal Grandmother   . Heart disease Paternal Grandmother   . Stroke Paternal Grandmother   . Cancer Paternal Grandmother        Lung  . Cancer Paternal Grandfather        Prostate  . Stroke Maternal Grandfather   . COPD Maternal Uncle   . Liver cancer Paternal Uncle   . Congestive Heart Failure Maternal Grandmother   . Hypertension Paternal Aunt   . Colon polyps Neg Hx   . Stomach cancer Neg Hx   . Rectal cancer Neg Hx   . Esophageal cancer Neg Hx     SOCIAL HISTORY: Social History   Socioeconomic  History  . Marital status: Married    Spouse name: [name]  . Number of children: 1  . Years of education: Not on file  . Highest education level: Associate degree: occupational, Hotel manager, or vocational program  Occupational History  . Not on file  Tobacco Use  . Smoking status: Never  Smoker  . Smokeless tobacco: Never Used  Vaping Use  . Vaping Use: Never used  Substance and Sexual Activity  . Alcohol use: No    Alcohol/week: 0.0 standard drinks  . Drug use: No  . Sexual activity: Yes    Birth control/protection: Post-menopausal    Comment: Pt declined sexual hx questions  Other Topics Concern  . Not on file  Social History Narrative   h h of 2 Pet cat   Daughter /  Married    g1 p1   Works at [place] Strain: Not on [employer] Insecurity: Not on file  Transportation Needs: Not on file  Physical Activity: Not on file  Stress: Not on file  Social Connections: Not on file  Intimate Partner Violence: Not on file     PHYSICAL EXAM  GENERAL EXAM/CONSTITUTIONAL: Vitals:  Vitals:   09/24/20 1506  BP: 139/77  Pulse: 81  Weight: 222 lb (100.7 kg)  Height: 5' 7.5" (1.715 m)     Body mass index is 34.26 kg/m. Wt Readings from Last 3 Encounters:  09/24/20 222 lb (100.7 kg)  08/14/20 214 lb (97.1 kg)  08/12/20 223 lb (101.2 kg)     Patient is in no distress; well developed, nourished and groomed; neck is supple  CARDIOVASCULAR:  Examination of carotid arteries is normal; no carotid bruits  Regular rate and rhythm, no murmurs  Examination of peripheral vascular system by observation and palpation is normal  EYES:  Ophthalmoscopic exam of optic discs and posterior segments is normal; no papilledema or hemorrhages  No exam data present  MUSCULOSKELETAL:  Gait, strength, tone, movements noted in Neurologic exam below  NEUROLOGIC: MENTAL STATUS:  MMSE - [place] Exam 09/24/2020   Orientation to time 5  Orientation to Place 5  Registration 3  Attention/ Calculation 4  Recall 3  Language- name 2 objects 2  Language- repeat 1  Language- follow 3 step command 3  Language- read & follow direction 1  Write a sentence 1  Copy design 1  Total score 29    awake, alert, oriented to person, place and time  recent and remote memory intact  normal attention and concentration  language fluent, comprehension intact, naming intact  fund of knowledge appropriate  CRANIAL NERVE:   2nd - no papilledema on fundoscopic exam  2nd, 3rd, 4th, 6th - pupils equal and reactive to light, visual fields full to confrontation, extraocular muscles intact, no nystagmus  5th - facial sensation symmetric  7th - facial strength symmetric  8th - hearing intact  9th - palate elevates symmetrically, uvula midline  11th - shoulder shrug symmetric  12th - tongue protrusion midline  MOTOR:   normal bulk and tone, full strength in the BUE, BLE  SENSORY:   normal and symmetric to light touch, temperature, vibration  COORDINATION:   finger-nose-finger, fine finger movements normal  REFLEXES:   deep tendon reflexes present and symmetric  GAIT/STATION:   narrow based gait     DIAGNOSTIC DATA (LABS, IMAGING, TESTING) - I reviewed patient records, labs, notes, testing and imaging myself where available.  Lab Results  Component Value Date   WBC 8.9 05/24/2020   HGB 14.9 05/24/2020   HCT 44.7 05/24/2020   MCV 86.6 05/24/2020   PLT 342 05/24/2020      Component Value Date/Time   NA 137 05/24/2020 0914   K 3.8 05/24/2020 0914   CL  99 05/24/2020 0914   CO2 27 05/24/2020 0914   GLUCOSE 98 05/24/2020 0914   BUN 17 05/24/2020 0914   CREATININE 0.94 05/24/2020 0914   CALCIUM 9.7 05/24/2020 0914   PROT 7.0 05/24/2020 0914   ALBUMIN 4.2 05/22/2019 1030   AST 25 05/24/2020 0914   ALT 24 05/24/2020 0914   ALKPHOS 63 05/22/2019 1030   BILITOT 0.6 05/24/2020 0914    GFRNONAA 66 05/24/2020 0914   GFRAA 76 05/24/2020 0914   Lab Results  Component Value Date   CHOL 222 (H) 05/24/2020   HDL 62 05/24/2020   LDLCALC 126 (H) 05/24/2020   LDLDIRECT 133.8 04/07/2013   TRIG 200 (H) 05/24/2020   CHOLHDL 3.6 05/24/2020   Lab Results  Component Value Date   HGBA1C 5.6 05/24/2020   No results found for: [id number] Lab Results  Component Value Date   TSH 0.83 09/13/2020      ASSESSMENT AND PLAN  60 y.o. year old female here with mild subjective memory loss issues since 2021 with no changes in ADLs.  Could represent mild cognitive impairment or other secondary cause.  We will proceed with further testing and work-up.  Dx:  1. Memory loss       PLAN:  MILD MEMORY LOSS ([facility]) - check MRI brain, neuropsych testing, B12 - daily physical activity / exercise (at least 15-30 minutes) - eat more plants / vegetables - increase social activities, brain stimulation, games, puzzles, hobbies, crafts, arts, music - aim for at least 7-8 hours sleep per night (or more) - avoid smoking and alcohol - caregiver resources provided - caution with medications, finances, driving  Orders Placed This Encounter  Procedures  . MR BRAIN WO CONTRAST  . Vitamin B12  . Ambulatory referral to Neuropsychology   Return pending results, for pending if symptoms worsen or fail to improve.    Penni Bombard, MD 10/28/5927, 2:44 PM Certified in Neurology, Neurophysiology and Neuroimaging  [facility] [address] [place], [place] [address] [phone]

## 2020-09-25 ENCOUNTER — Telehealth: Payer: Self-pay | Admitting: Diagnostic Neuroimaging

## 2020-09-25 ENCOUNTER — Encounter: Payer: Self-pay | Admitting: *Deleted

## 2020-09-25 LAB — VITAMIN B12: Vitamin B-12: 562 pg/mL (ref 232–1245)

## 2020-09-25 NOTE — Telephone Encounter (Signed)
Patient returned my call she is scheduled at [facility] for 10/02/20.

## 2020-09-25 NOTE — Telephone Encounter (Signed)
LVM for pt to call back about scheduling mri  BC[employer] auth: [id number] (exp. 09/25/20 to 03/23/21)

## 2020-09-30 ENCOUNTER — Other Ambulatory Visit: Payer: Self-pay

## 2020-09-30 MED ORDER — LEVOTHYROXINE SODIUM 50 MCG PO TABS: 50.0000 ug | ORAL_TABLET | Freq: Every day | ORAL | 2 refills | Status: DC

## 2020-09-30 NOTE — Telephone Encounter (Signed)
Please send in 90 days of the levothyroxine refill x2 to her mail away.

## 2020-10-01 DIAGNOSIS — Z9889 Other specified postprocedural states: Secondary | ICD-10-CM | POA: Diagnosis not present

## 2020-10-02 ENCOUNTER — Ambulatory Visit: Payer: BC Managed Care – PPO

## 2020-10-02 ENCOUNTER — Other Ambulatory Visit: Payer: Self-pay

## 2020-10-02 DIAGNOSIS — R413 Other amnesia: Secondary | ICD-10-CM

## 2020-10-31 ENCOUNTER — Encounter: Payer: Self-pay | Admitting: *Deleted

## 2020-12-04 ENCOUNTER — Telehealth: Payer: Self-pay | Admitting: Internal Medicine

## 2020-12-04 NOTE — Telephone Encounter (Signed)
Laura Smith is calling from [facility] and wanted to let the provider they needed the initial portion for migraine prevention and to clarify if patient will be taking more than 8 tablets a month. Formed is going to be faxed today. CB is [phone]

## 2020-12-05 NOTE — Telephone Encounter (Signed)
Forms have been faxed to [phone]

## 2020-12-09 ENCOUNTER — Telehealth: Payer: Self-pay

## 2020-12-09 NOTE — Telephone Encounter (Signed)
I initiatd a PA for the pt in regard to her Nurtec 75 mg. Key- [id number]. The PA has been denied by the pt's insurance due to this request not meeting the definition of medical necessity found in the pt's membership benefit booklet.

## 2020-12-12 NOTE — Telephone Encounter (Signed)
Inform patient of  insurance denial .    Would opine to neurology or a headache specialist to decide next step for her headaches   Dr Leta Baptist didn't see you for headaches but you could contact their team about next step if poorly controlled headaches .   Laura KitchenMarland Smith

## 2020-12-16 NOTE — Telephone Encounter (Signed)
Pt informed of the message below and verbalized understanding. 

## 2020-12-16 NOTE — Telephone Encounter (Signed)
Left a message for the pt to return my call.  

## 2020-12-16 NOTE — Telephone Encounter (Signed)
Pt returned the call to the office. 

## 2021-01-03 ENCOUNTER — Encounter: Payer: Self-pay | Admitting: Obstetrics & Gynecology

## 2021-01-03 DIAGNOSIS — Z1231 Encounter for screening mammogram for malignant neoplasm of breast: Secondary | ICD-10-CM | POA: Diagnosis not present

## 2021-03-18 DIAGNOSIS — M542 Cervicalgia: Secondary | ICD-10-CM | POA: Diagnosis not present

## 2021-03-18 DIAGNOSIS — G43909 Migraine, unspecified, not intractable, without status migrainosus: Secondary | ICD-10-CM | POA: Diagnosis not present

## 2021-03-18 DIAGNOSIS — Z6833 Body mass index (BMI) 33.0-33.9, adult: Secondary | ICD-10-CM | POA: Diagnosis not present

## 2021-03-18 DIAGNOSIS — I10 Essential (primary) hypertension: Secondary | ICD-10-CM | POA: Diagnosis not present

## 2021-04-01 ENCOUNTER — Other Ambulatory Visit: Payer: Self-pay

## 2021-04-01 ENCOUNTER — Telehealth (INDEPENDENT_AMBULATORY_CARE_PROVIDER_SITE_OTHER): Payer: BC Managed Care – PPO | Admitting: Family Medicine

## 2021-04-01 ENCOUNTER — Encounter: Payer: Self-pay | Admitting: Family Medicine

## 2021-04-01 VITALS — Wt 215.0 lb

## 2021-04-01 DIAGNOSIS — R197 Diarrhea, unspecified: Secondary | ICD-10-CM | POA: Diagnosis not present

## 2021-04-01 DIAGNOSIS — R112 Nausea with vomiting, unspecified: Secondary | ICD-10-CM | POA: Diagnosis not present

## 2021-04-01 DIAGNOSIS — R109 Unspecified abdominal pain: Secondary | ICD-10-CM | POA: Diagnosis not present

## 2021-04-01 NOTE — Patient Instructions (Signed)
   ---------------------------------------------------------------------------------------------------------------------------      WORK SLIP:  Patient Laura Smith,  02-Oct-1960, was seen for a medical visit today, 04/01/21 . Please excuse from work for an illness until symptoms resolved for 24 hours.  Sincerely: E-signature: Dr. Colin Benton, DO [place] Ph: [phone]   ------------------------------------------------------------------------------------------------------------------------------  No dairy or red meat for 1 week.  Can use imodium if any further diarrhea.  I hope you are all feeling better soon!  Seek in person care promptly if your symptoms worsen, there is any further bleeding, new concerns arise or you are not continuing to improve with resolution of symptoms over the next 1-2 days.   It was nice to meet you today. I help [facility] out with telemedicine visits on Tuesdays and Thursdays and am available for visits on those days. If you have any concerns or questions following this visit please schedule a follow up visit with your Primary Care doctor or seek care at a local urgent care clinic to avoid delays in care.

## 2021-04-01 NOTE — Progress Notes (Signed)
Virtual Visit via Telephone Note  I connected with Laura Smith on 04/01/21 at 11:20 AM EDT by telephone and verified that I am speaking with the correct person using two identifiers.   I discussed the limitations, risks, security and privacy concerns of performing an evaluation and management service by telephone and the availability of in person appointments. I also discussed with the patient that there may be a patient responsible charge related to this service. The patient expressed understanding and agreed to proceed.  Location patient: home, [place] Location provider: work or home office Participants present for the call: patient, provider Patient did not have a visit with me in the prior 7 days to address this/these issue(s).   History of Present Illness:  Acute telemedicine visit for diarrhea: -Onset: about 2-3 days ago -she had a negative covid test today -Symptoms include: abdominal discomfort diffusely (now improving), vomiting initially and diarrhea, had watery diarrhea for several days - now resolving, she saw a streak of blood on the TP a few days ago she thinks was from her hemorrhoid- normal BM today and no bleeding -she got sick 1 hour after eating bacon at a restaurant - it was cooked, reports was not raw -Denies:inability to eat or drink (has had grilled cheese, crackers, drinking ginger ale), respiratory symptoms, severe abd pain currently, fevers, persistent hematochezia or melena -Pertinent past medical history: see below -Pertinent medication allergies:  Allergies  Allergen Reactions   Etodolac Hives    Possible anaphylaxis  With vomiting and throat feeling tight  ED visit 8 14    Latex Hives, Rash and Other (See Comments)    Causes blisters   Other Hives, Rash and Other (See Comments)    Gel used for ultrasound-causes blisters also    Adhesive [Tape] Itching and Rash  -COVID-19 vaccine status: vaccinated x2 1 booster   Observations/Objective: Patient sounds  cheerful and well on the phone. I do not appreciate any SOB. Speech and thought processing are grossly intact. Patient reported vitals:  Assessment and Plan:  Diarrhea, unspecified type  Abdominal discomfort  Nausea and vomiting, unspecified vomiting type  -we discussed possible serious and likely etiologies, options for evaluation and workup, limitations of telemedicine visit vs in person visit, treatment, treatment risks and precautions. Pt prefers to treat via telemedicine empirically rather than in person at this moment. Query gastroenteritis vs other. Since improving has opted for bland diet, oral hydration, avoidance of dairy and red meat, imodium if needed.  Work/School slipped offered: provided in patient instructions  Advised to seek prompt in person care if worsening, new symptoms arise, any BRBPR or if is not continuing to improve with resolution of symptoms over the next 1-2 days. Advised of options for inperson care in case PCP office not available. Did let the patient know that I only do telemedicine shifts for [place] on Tuesdays and Thursdays and advised a follow up visit with PCP or at an [facility] if has further questions or concerns.   Follow Up Instructions:  I did not refer this patient for an OV with me in the next 24 hours for this/these issue(s).  I discussed the assessment and treatment plan with the patient. The patient was provided an opportunity to ask questions and all were answered. The patient agreed with the plan and demonstrated an understanding of the instructions.   I spent 21 minutes on the date of this visit in the care of this patient. See summary of tasks completed to properly care for  this patient in the detailed notes above which also included counseling of above, review of PMH, medications, allergies, evaluation of the patient and ordering and/or  instructing patient on testing and care options.     Lucretia Kern, DO

## 2021-04-10 ENCOUNTER — Other Ambulatory Visit: Payer: Self-pay | Admitting: *Deleted

## 2021-04-10 DIAGNOSIS — R918 Other nonspecific abnormal finding of lung field: Secondary | ICD-10-CM

## 2021-04-25 ENCOUNTER — Ambulatory Visit
Admission: RE | Admit: 2021-04-25 | Discharge: 2021-04-25 | Disposition: A | Discharge status: Home / Self Care | Payer: BC Managed Care – PPO | Source: Ambulatory Visit | Attending: Cardiology | Admitting: Cardiology

## 2021-04-25 DIAGNOSIS — R918 Other nonspecific abnormal finding of lung field: Secondary | ICD-10-CM

## 2021-04-25 DIAGNOSIS — R911 Solitary pulmonary nodule: Secondary | ICD-10-CM | POA: Diagnosis not present

## 2021-05-01 DIAGNOSIS — M545 Low back pain, unspecified: Secondary | ICD-10-CM | POA: Diagnosis not present

## 2021-05-01 DIAGNOSIS — I10 Essential (primary) hypertension: Secondary | ICD-10-CM | POA: Diagnosis not present

## 2021-05-01 DIAGNOSIS — Z9889 Other specified postprocedural states: Secondary | ICD-10-CM | POA: Diagnosis not present

## 2021-05-01 DIAGNOSIS — G8929 Other chronic pain: Secondary | ICD-10-CM | POA: Diagnosis not present

## 2021-05-03 ENCOUNTER — Encounter: Payer: Self-pay | Admitting: Physician Assistant

## 2021-05-03 ENCOUNTER — Other Ambulatory Visit: Payer: Self-pay

## 2021-05-03 ENCOUNTER — Ambulatory Visit
Admission: EM | Admit: 2021-05-03 | Discharge: 2021-05-03 | Disposition: A | Discharge status: Home / Self Care | Payer: BC Managed Care – PPO | Attending: Physician Assistant | Admitting: Physician Assistant

## 2021-05-03 DIAGNOSIS — J029 Acute pharyngitis, unspecified: Secondary | ICD-10-CM | POA: Diagnosis not present

## 2021-05-03 DIAGNOSIS — J069 Acute upper respiratory infection, unspecified: Secondary | ICD-10-CM | POA: Diagnosis not present

## 2021-05-03 LAB — POCT RAPID STREP A (OFFICE): Rapid Strep A Screen: NEGATIVE

## 2021-05-03 MED ORDER — PREDNISONE 20 MG PO TABS: 40.0000 mg | ORAL_TABLET | Freq: Every day | ORAL | 0 refills | Status: AC

## 2021-05-03 NOTE — ED Provider Notes (Signed)
[facility]    CSN: [id number] Arrival date & time: 05/03/21  1937      History   Chief Complaint Chief Complaint  Patient presents with   Sore Throat    HPI Laura Smith is a 60 y.o. female.   Patient here today for evaluation of sore throat, nasal congestion and cough that she has had for the last 14 days.  She states that she felt symptoms were improving but then woke with a scratchy throat this morning.  She has not had any fevers.  She does report some decrease in taste.  Has tried over-the-counter medication without significant relief.  The history is provided by the patient.  Sore Throat Pertinent negatives include no abdominal pain and no shortness of breath.   Past Medical History:  Diagnosis Date   Allergy    Breast cyst    Endometriosis    surgery laser 2001   Family history of breast cancer    Family history of colon cancer    Family history of prostate cancer    Hemorrhoids    Hx of basal cell carcinoma    Hypertension    Migraine    Thyroid disease    Urinary tract infection     Patient Active Problem List   Diagnosis Date Noted   At high risk for breast cancer 07/05/2020   Genetic testing 02/22/2020   Family history of prostate cancer    Family history of breast cancer    Family history of colon cancer    Precordial chest pain 09/07/2019   Educated about COVID-19 virus infection 09/07/2019   Angina at rest ([place]) 04/06/2017   Seasonal allergies 04/13/2014   Fasting hyperglycemia 04/13/2014   Skin lesion 04/13/2014   Pain of left heel 11/21/2013   Visit for preventive health examination 04/11/2013   HTN (hypertension) 04/11/2013   Hx of dizziness 04/11/2013   Interstitial cystitis 09/15/2012   Back pain 09/15/2012   Allergic rhinitis 07/10/2012   Edema of both legs 07/10/2012   BMI 34.0-34.9,adult 07/10/2012   Frequent stools 07/10/2012   Hypertension    Endometriosis    Hemorrhoids    IC (interstitial cystitis)     Migraine     Past Surgical History:  Procedure Laterality Date   APPENDECTOMY  1992   AT C/S   BACK SURGERY  06/06/2020   L3-4   CESAREAN SECTION  1992   APPENDECTOMY SAME TIME   COLONOSCOPY  2015   HYSTEROSCOPY  08/1999   AT TIME OF LAPAROSCOPY   PELVIC LAPAROSCOPY  08/1999   AND HYSTEROSCOPY--ENDOMETRIOSIS   POLYPECTOMY      OB History     Gravida  1   Para  1   Term  1   Preterm      AB      Living  1      SAB      IAB      Ectopic      Multiple      Live Births               Home Medications    Prior to Admission medications   Medication Sig Start Date End Date Taking? Authorizing Provider  predniSONE (DELTASONE) 20 MG tablet Take 2 tablets (40 mg total) by mouth daily with breakfast for 5 days. 05/03/21 05/08/21 Yes Francene Finders, PA-C  Ascorbic Acid (VITAMIN C) 500 MG CAPS Take 1,000 mg by mouth every morning.  [provider]  b complex vitamins tablet Take 1 tablet by mouth daily.    [provider]  CALCIUM-MAGNESIUM PO Take 3 tablets by mouth daily.     [provider]  cetirizine (ZYRTEC) 10 MG tablet Take 10 mg by mouth daily.    [provider]  cyclobenzaprine (FLEXERIL) 10 MG tablet Take 10 mg by mouth as needed for muscle spasms.  01/25/17   [provider]  hydrochlorothiazide (HYDRODIURIL) 25 MG tablet TAKE 1 TABLET BY MOUTH DAILY 06/03/20   Panosh, [name], MD  levothyroxine (SYNTHROID) 50 MCG tablet Take 1 tablet (50 mcg total) by mouth daily. 09/30/20   Panosh, [name], MD  meclizine (ANTIVERT) 25 MG tablet TAKE 2 TABLET (50 MG TOTAL) BY MOUTH 2 (TWO) TIMES DAILY AS NEEDED (FOR TRAVEL). 12/07/19   Panosh, [name], MD  Misc Natural Products (RED WINE EXTRACT PO) Take by mouth.    [provider]  Multiple Vitamins-Iron (MULTIVITAMIN/IRON) TABS Take 1 tablet by mouth daily.    [provider]  nystatin (NYSTATIN) powder Apply topically 4 (four) times daily. As needed  for rash 11/22/18   Fontaine, Belinda Block, MD  oxyCODONE-acetaminophen (PERCOCET/ROXICET) 5-325 MG tablet Take 1 tablet by mouth every 4 (four) hours as needed. 06/06/20   [provider]  Rimegepant Sulfate (NURTEC) 75 MG TBDP Take 1 tablet by mouth as needed (for migraine). 05/24/20   Panosh, [name], MD  sulindac (CLINORIL) 200 MG tablet Take 1 tablet (200 mg total) by mouth 2 (two) times daily as needed (headache). Reported on 09/23/2015 05/22/19   Panosh, [name], MD  SUMAtriptan (IMITREX) 6 MG/0.5ML SOLN injection Inject 6 mg into the skin every 2 (two) hours as needed for migraine. Reported on 09/23/2015    [provider]  traMADol (ULTRAM) 50 MG tablet Take 50 mg by mouth as needed for severe pain.  01/19/17   [provider]  verapamil (CALAN-SR) 180 MG CR tablet TAKE 1 TABLET BY MOUTH AT BEDTIME Patient taking differently: Take 180 mg by mouth daily. 06/03/20   Panosh, [name], MD    Family History Family History  Problem Relation Age of Onset   Breast cancer Mother        Age 73   Colon cancer Mother 71   Breast cancer Cousin 32       paternal first cousin   Heart disease Father 43       Heart attack   Hypertension Paternal Grandmother    Heart disease Paternal Grandmother    Stroke Paternal Grandmother    Cancer Paternal Grandmother        Lung   Cancer Paternal Grandfather        Prostate   Stroke Maternal Grandfather    COPD Maternal Uncle    Liver cancer Paternal Uncle    Congestive Heart Failure Maternal Grandmother    Hypertension Paternal Aunt    Colon polyps Neg Hx    Stomach cancer Neg Hx    Rectal cancer Neg Hx    Esophageal cancer Neg Hx     Social History Social History   Tobacco Use   Smoking status: Never   Smokeless tobacco: Never  Vaping Use   Vaping Use: Never used  Substance Use Topics   Alcohol use: No    Alcohol/week: 0.0 standard drinks   Drug use: No     Allergies   Etodolac, Latex, Other, and Adhesive  [tape]   Review of  Systems Review of Systems  Constitutional:  Negative for chills and fever.  HENT:  Positive for congestion and sore throat. Negative for ear pain and sinus pressure.   Eyes:  Negative for discharge and redness.  Respiratory:  Positive for cough. Negative for shortness of breath and wheezing.   Gastrointestinal:  Negative for abdominal pain, diarrhea, nausea and vomiting.    Physical Exam Triage Vital Signs ED Triage Vitals [05/03/21 0951]  Enc Vitals Group     BP (!) 150/77     Pulse Rate 86     Resp 16     Temp 98.7 F (37.1 C)     Temp Source Oral     SpO2 97 %     Weight      Height      Head Circumference      Peak Flow      Pain Score 8     Pain Loc      Pain Edu?      Excl. in [place]?    No data found.  Updated Vital Signs BP (!) 150/77 (BP Location: Right Arm)   Pulse 86   Temp 98.7 F (37.1 C) (Oral)   Resp 16   SpO2 97%   Physical Exam Vitals and nursing note reviewed.  Constitutional:      General: She is not in acute distress.    Appearance: Normal appearance. She is not ill-appearing.  HENT:     Head: Normocephalic and atraumatic.     Nose: Congestion present.     Mouth/Throat:     Mouth: Mucous membranes are moist.     Pharynx: No oropharyngeal exudate or posterior oropharyngeal erythema.  Eyes:     Conjunctiva/sclera: Conjunctivae normal.  Cardiovascular:     Rate and Rhythm: Normal rate and regular rhythm.     Heart sounds: Normal heart sounds. No murmur heard. Pulmonary:     Effort: Pulmonary effort is normal. No respiratory distress.     Breath sounds: Normal breath sounds. No wheezing, rhonchi or rales.  Skin:    General: Skin is warm and dry.  Neurological:     Mental Status: She is alert.  Psychiatric:        Mood and Affect: Mood normal.        Thought Content: Thought content normal.     UC Treatments / Results  Labs (all labs ordered are listed, but only abnormal results are displayed) Labs Reviewed   CULTURE, GROUP A STREP [facility])  POCT RAPID STREP A (OFFICE)    EKG   Radiology No results found.  Procedures Procedures (including critical care time)  Medications Ordered in UC Medications - No data to display  Initial Impression / Assessment and Plan / UC Course  I have reviewed the triage vital signs and the nursing notes.  Pertinent labs & imaging results that were available during my care of the patient were reviewed by me and considered in my medical decision making (see chart for details).  Suspect likely viral etiology of symptoms versus allergies.  Will treat with prednisone to hopefully help with his inflammation and improve symptoms, and deferred other viral screening given duration of symptoms.    Final Clinical Impressions(s) / UC Diagnoses   Final diagnoses:  Acute pharyngitis, unspecified etiology  Acute upper respiratory infection   Discharge Instructions   None    ED Prescriptions     Medication Sig Dispense Auth. Provider   predniSONE (DELTASONE) 20 MG tablet Take 2  tablets (40 mg total) by mouth daily with breakfast for 5 days. 10 tablet Francene Finders, PA-C      PDMP not reviewed this encounter.   Francene Finders, PA-C 05/03/21 1102

## 2021-05-03 NOTE — ED Triage Notes (Signed)
Pt said x 14 days has been off and on with sore throat and nasal drainage. Was getting better and woke up this am with scratchy throat on one side, no fevers, slight dry cough

## 2021-05-05 LAB — CULTURE, GROUP A STREP (THRC)

## 2021-05-19 ENCOUNTER — Other Ambulatory Visit: Payer: Self-pay | Admitting: Internal Medicine

## 2021-05-27 ENCOUNTER — Encounter: Payer: BC Managed Care – PPO | Admitting: Internal Medicine

## 2021-05-27 ENCOUNTER — Telehealth: Payer: Self-pay | Admitting: Physician Assistant

## 2021-05-27 NOTE — Telephone Encounter (Signed)
Per sch msg, provider though LOS was missed earlier this year. Scheduled appt as requested. Called and spoke with patient. Patient stated she told the scheduler earlier this year that she did not want to schedule at that time due to the cost of that appt and she wanted to wait until she had finished paying that bill off before she returned.

## 2021-06-06 ENCOUNTER — Ambulatory Visit: Payer: BC Managed Care – PPO | Admitting: Diagnostic Neuroimaging

## 2021-06-06 ENCOUNTER — Other Ambulatory Visit: Payer: Self-pay

## 2021-06-06 ENCOUNTER — Encounter: Payer: Self-pay | Admitting: Diagnostic Neuroimaging

## 2021-06-06 VITALS — BP 130/84 | HR 81 | Ht 67.5 in | Wt 216.4 lb

## 2021-06-06 DIAGNOSIS — G43109 Migraine with aura, not intractable, without status migrainosus: Secondary | ICD-10-CM

## 2021-06-06 MED ORDER — NURTEC 75 MG PO TBDP
1.0000 | ORAL_TABLET | ORAL | 6 refills | Status: DC | PRN
Start: 2021-06-06 — End: 2021-06-09

## 2021-06-06 NOTE — Patient Instructions (Signed)
°  MIGRAINE RESCUE  - ibuprofen, tylenol as needed - continue rimegepant (Nurtec) 75mg  as needed for breakthrough headache; max 8 per month

## 2021-06-06 NOTE — Progress Notes (Signed)
[facility]  PATIENT: Laura Smith DOB: 10/27/1960  REFERRING CLINICIAN: Patricia Nettle, NP HISTORY FROM: Patient REASON FOR VISIT: New consult   HISTORICAL  CHIEF COMPLAINT:  Chief Complaint  Patient presents with   New Patient (Initial Visit)    Rm 5. Alone. NX Pen LV 2022/Paper/[facility]/Meghan Bergman NP/migraines    HISTORY OF PRESENT ILLNESS:   UPDATE (06/06/21, VRP): Since last visit, doing about the same. Memory issues mild and stable. Still working.   Here for migraine transfer of care --> started since 60 years old, right side hammer, nausea, sens to lighy, hours, now about 1 per month. Triggers --> rain and menstrual cycle in past. Good results with nurtec, sulindac, ibuprofen, tylenol.   PRIOR HPI: 60 year old female here for evaluation memory loss.  Patient has been working at current company for more than 20 years.  She was using a particular type of software and job description for many years.  In May 2021 a transition to a new software and patient began training to learn the new process.  She tried from May until September 2021 but had quite difficult time learning the new routine.  She was surprised that she was unable to learn this.  She mentioned that she had some difficulty learning because she was being taught by 3 different people who contradicted she is other on instructions.  Previously she was considered a superuser of the old software system.  She did not get selected to be super user for the new system.  She is also having some problems with word finding difficulties, short-term memory loss, difficulty staying focused.  No major changes in ADLs.  Patient has been out of work since November 2021 due to back pain issues as well.    REVIEW OF SYSTEMS: Full 14 system review of systems performed and negative with exception of: as per HPI.  ALLERGIES: Allergies  Allergen Reactions   Etodolac Hives    Possible anaphylaxis   With vomiting and throat feeling tight  ED visit 8 14    Latex Hives, Rash and Other (See Comments)    Causes blisters   Other Hives, Rash and Other (See Comments)    Gel used for ultrasound-causes blisters also    Adhesive [Tape] Itching and Rash    HOME MEDICATIONS: Outpatient Medications Prior to Visit  Medication Sig Dispense Refill   Ascorbic Acid (VITAMIN C) 500 MG CAPS Take 1,000 mg by mouth every morning.      b complex vitamins tablet Take 1 tablet by mouth daily.     CALCIUM-MAGNESIUM PO Take 3 tablets by mouth daily.      cetirizine (ZYRTEC) 10 MG tablet Take 10 mg by mouth daily.     cyclobenzaprine (FLEXERIL) 10 MG tablet Take 10 mg by mouth as needed for muscle spasms.      hydrochlorothiazide (HYDRODIURIL) 25 MG tablet TAKE 1 TABLET BY MOUTH DAILY 90 tablet 0   levothyroxine (SYNTHROID) 50 MCG tablet Take 1 tablet (50 mcg total) by mouth daily. 90 tablet 2   meclizine (ANTIVERT) 25 MG tablet TAKE 2 TABLET (50 MG TOTAL) BY MOUTH 2 (TWO) TIMES DAILY AS NEEDED (FOR TRAVEL). 30 tablet 0   Misc Natural Products (RED WINE EXTRACT PO) Take by mouth.     Multiple Vitamins-Iron (MULTIVITAMIN/IRON) TABS Take 1 tablet by mouth daily.     nystatin (NYSTATIN) powder Apply topically 4 (four) times daily. As needed for rash 30 g 2   oxyCODONE-acetaminophen (PERCOCET/ROXICET)  5-325 MG tablet Take 1 tablet by mouth every 4 (four) hours as needed.     Rimegepant Sulfate (NURTEC) 75 MG TBDP Take 1 tablet by mouth as needed (for migraine). 30 tablet 0   sulindac (CLINORIL) 200 MG tablet Take 1 tablet (200 mg total) by mouth 2 (two) times daily as needed (headache). Reported on 09/23/2015 30 tablet 3   SUMAtriptan (IMITREX) 6 MG/0.5ML SOLN injection Inject 6 mg into the skin every 2 (two) hours as needed for migraine. Reported on 09/23/2015     traMADol (ULTRAM) 50 MG tablet Take 50 mg by mouth as needed for severe pain.      verapamil (CALAN-SR) 180 MG CR tablet TAKE 1 TABLET BY MOUTH AT BEDTIME  90 tablet 0   Facility-Administered Medications Prior to Visit  Medication Dose Route Frequency Provider Last Rate Last Admin   methylPREDNISolone acetate (DEPO-MEDROL) injection 120 mg  120 mg Intramuscular Once Panosh, [name], MD        PAST MEDICAL HISTORY: Past Medical History:  Diagnosis Date   Allergy    Breast cyst    Endometriosis    surgery laser 2001   Family history of breast cancer    Family history of colon cancer    Family history of prostate cancer    Hemorrhoids    Hx of basal cell carcinoma    Hypertension    Migraine    Thyroid disease    Urinary tract infection     PAST SURGICAL HISTORY: Past Surgical History:  Procedure Laterality Date   APPENDECTOMY  1992   AT C/S   BACK SURGERY  06/06/2020   L3-4   CESAREAN SECTION  1992   APPENDECTOMY SAME TIME   COLONOSCOPY  2015   HYSTEROSCOPY  08/1999   AT TIME OF LAPAROSCOPY   PELVIC LAPAROSCOPY  08/1999   AND HYSTEROSCOPY--ENDOMETRIOSIS   POLYPECTOMY      FAMILY HISTORY: Family History  Problem Relation Age of Onset   Breast cancer Mother        Age 26   Colon cancer Mother 26   Breast cancer Cousin 52       paternal first cousin   Heart disease Father 58       Heart attack   Hypertension Paternal Grandmother    Heart disease Paternal Grandmother    Stroke Paternal Grandmother    Cancer Paternal Grandmother        Lung   Cancer Paternal Grandfather        Prostate   Stroke Maternal Grandfather    COPD Maternal Uncle    Liver cancer Paternal Uncle    Congestive Heart Failure Maternal Grandmother    Hypertension Paternal Aunt    Colon polyps Neg Hx    Stomach cancer Neg Hx    Rectal cancer Neg Hx    Esophageal cancer Neg Hx     SOCIAL HISTORY: Social History   Socioeconomic History   Marital status: Married    Spouse name: [name]   Number of children: 1   Years of education: Not on file   Highest education level: Associate degree: occupational, Hotel manager, or vocational program   Occupational History   Not on file  Tobacco Use   Smoking status: Never   Smokeless tobacco: Never  Vaping Use   Vaping Use: Never used  Substance and Sexual Activity   Alcohol use: No    Alcohol/week: 0.0 standard drinks   Drug use: No   Sexual activity: Yes  Birth control/protection: Post-menopausal    Comment: Pt declined sexual hx questions  Other Topics Concern   Not on file  Social History Narrative   h h of 2 Pet cat   Daughter /  Married    g1 p1   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 Wt Readings from Last 3 Encounters:  06/06/21 216 lb 6.4 oz (98.2 kg)  04/01/21 215 lb (97.5 kg)  09/24/20 222 lb (100.7 kg)   Patient is in no distress; well developed, nourished and groomed; neck is supple  CARDIOVASCULAR: Examination of carotid arteries is normal; no carotid bruits Regular rate and rhythm, no murmurs Examination of peripheral vascular system by observation and palpation is normal  EYES: Ophthalmoscopic exam of optic discs and posterior segments is normal; no papilledema or hemorrhages No results found.  MUSCULOSKELETAL: Gait, strength, tone, movements noted in Neurologic exam below  NEUROLOGIC: MENTAL STATUS:  MMSE - Mini Mental State Exam 09/24/2020  Orientation to time 5  Orientation to Place 5  Registration 3  Attention/ Calculation 4  Recall 3  Language- name 2 objects 2  Language- repeat 1  Language- follow 3 step command 3  Language- read & follow direction 1  Write a sentence 1  Copy  design 1  Total score 29   awake, alert, oriented to person, place and time recent and remote memory intact normal attention and concentration language fluent, comprehension intact, naming intact fund of knowledge appropriate  CRANIAL NERVE:  2nd - no papilledema on fundoscopic exam 2nd, 3rd, 4th, 6th - pupils equal and reactive to light, visual fields full to confrontation, extraocular muscles intact, no nystagmus 5th - facial sensation symmetric 7th - facial strength symmetric 8th - hearing intact 9th - palate elevates symmetrically, uvula midline 11th - shoulder shrug symmetric 12th - tongue protrusion midline  MOTOR:  normal bulk and tone, full strength in the BUE, BLE  SENSORY:  normal and symmetric to light touch, temperature, vibration  COORDINATION:  finger-nose-finger, fine finger movements normal  REFLEXES:  deep tendon reflexes present and symmetric  GAIT/STATION:  narrow based gait     DIAGNOSTIC DATA (LABS, IMAGING, TESTING) - I reviewed patient records, labs, notes, testing and imaging myself where available.  Lab Results  Component Value Date   WBC 8.9 05/24/2020   HGB 14.9 05/24/2020   HCT 44.7 05/24/2020   MCV 86.6 05/24/2020   PLT 342 05/24/2020      Component Value Date/Time   NA 137 05/24/2020 0914   K 3.8 05/24/2020 0914   CL 99 05/24/2020 0914   CO2 27 05/24/2020 0914   GLUCOSE 98 05/24/2020 0914   BUN 17 05/24/2020 0914   CREATININE 0.94 05/24/2020 0914   CALCIUM 9.7 05/24/2020 0914   PROT 7.0 05/24/2020 0914   ALBUMIN 4.2 05/22/2019 1030   AST 25 05/24/2020 0914   ALT 24 05/24/2020 0914   ALKPHOS 63 05/22/2019 1030   BILITOT 0.6 05/24/2020 0914  GFRNONAA 66 05/24/2020 0914   GFRAA 76 05/24/2020 0914   Lab Results  Component Value Date   CHOL 222 (H) 05/24/2020   HDL 62 05/24/2020   LDLCALC 126 (H) 05/24/2020   LDLDIRECT 133.8 04/07/2013   TRIG 200 (H) 05/24/2020   CHOLHDL 3.6 05/24/2020   Lab Results  Component  Value Date   HGBA1C 5.6 05/24/2020   Lab Results  Component Value Date   VITAMINB12 562 09/24/2020   Lab Results  Component Value Date   TSH 0.83 09/13/2020    10/02/20 Normal MRI brain    ASSESSMENT AND PLAN  59 y.o. year old female here with mild subjective memory loss issues since 2021 with no changes in ADLs.  Could represent mild cognitive impairment or other secondary cause.     Dx:  1. Migraine with aura and without status migrainosus, not intractable      PLAN:  MIGRAINE PREVENTION  LIFESTYLE CHANGES -Stop or avoid smoking -Decrease or avoid caffeine / alcohol -Eat and sleep on a regular schedule -Exercise several times per week  MIGRAINE RESCUE  - ibuprofen, tylenol as needed - continue rimegepant (Nurtec) 75mg  as needed for breakthrough headache; max 8 per month  MILD MEMORY LOSS (MCI) - follow up neuropsych testing (ordered in April 2022) - daily physical activity / exercise (at least 15-30 minutes) - eat more plants / vegetables - increase social activities, brain stimulation, games, puzzles, hobbies, crafts, arts, music - aim for at least 7-8 hours sleep per night (or more) - avoid smoking and alcohol - caregiver resources provided - caution with medications, finances, driving  personality / behavior (OCD tendency, perfectionism, ADD tendencies) - consider referral to psychology  Return in about 1 year (around 06/06/2022) for MyChart visit (15 min).    Penni Bombard, MD 65/46/5035, 46:56 AM Certified in Neurology, Neurophysiology and Neuroimaging  [facility] [address], [place], [place] [address] [phone]

## 2021-06-09 ENCOUNTER — Other Ambulatory Visit: Payer: Self-pay | Admitting: *Deleted

## 2021-06-09 DIAGNOSIS — G43109 Migraine with aura, not intractable, without status migrainosus: Secondary | ICD-10-CM

## 2021-06-09 MED ORDER — NURTEC 75 MG PO TBDP: 1.0000 | ORAL_TABLET | ORAL | 6 refills | Status: DC | PRN

## 2021-06-10 NOTE — Progress Notes (Signed)
Chief Complaint  Patient presents with   Annual Exam    HPI: Patient  Laura Smith  60 y.o. comes in today for Preventive Health Care visit  Had back surgery Dr. 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  But coping fine Headaches went away or suppressed while she was out of work postop.  So saw Dr. Cornelious Bryant and she maintains on the Nurtec which has been helpful Taking thyroid medicine no concerns Blood pressure has been okay   Health Maintenance  Topic Date Due   Pneumococcal Vaccine 9-19 Years old (1 - PCV) Never done   Hepatitis C Screening  Never done   PAP SMEAR-Modifier  06/03/2021   COVID-19 Vaccine (4 - Booster for [place] series) 01/09/2022 (Originally 09/11/2020)   MAMMOGRAM  01/04/2023   COLONOSCOPY (Pts 45-49yrs Insurance coverage will need to be confirmed)  08/14/2025   TETANUS/TDAP  04/29/2026   INFLUENZA VACCINE  Completed   HIV Screening  Completed   Zoster Vaccines- Shingrix  Completed   HPV VACCINES  Aged Out   Health Maintenance Review LIFESTYLE:  Exercise:  work   not much .   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    ROS:  REST of 12 system review negative except as per HPI   Past Medical History:  Diagnosis Date   Allergy    Breast cyst    Endometriosis    surgery laser 2001   Family history of breast cancer    Family history of colon cancer    Family history of prostate cancer    Hemorrhoids    Hx of basal cell carcinoma    Hypertension    Migraine    Thyroid disease    Urinary tract infection     Past Surgical History:  Procedure Laterality Date   APPENDECTOMY  1992   AT C/S   BACK SURGERY  06/06/2020   L3-4   CESAREAN SECTION  1992   APPENDECTOMY SAME TIME   COLONOSCOPY  2015   HYSTEROSCOPY  08/1999   AT TIME OF LAPAROSCOPY    PELVIC LAPAROSCOPY  08/1999   AND HYSTEROSCOPY--ENDOMETRIOSIS   POLYPECTOMY      Family History  Problem Relation Age of Onset   Breast cancer Mother        Age 52   Colon cancer Mother 61   Breast cancer Cousin 32       paternal first cousin   Heart disease Father 7       Heart attack   Hypertension Paternal Grandmother    Heart disease Paternal Grandmother    Stroke Paternal Grandmother    Cancer Paternal Grandmother        Lung   Cancer Paternal Grandfather        Prostate   Stroke Maternal Grandfather    COPD Maternal Uncle    Liver cancer Paternal Uncle    Congestive Heart Failure Maternal Grandmother    Hypertension Paternal Aunt    Colon polyps Neg Hx    Stomach cancer Neg Hx    Rectal cancer Neg Hx    Esophageal cancer Neg Hx     Social History   Socioeconomic History   Marital status: Married  Spouse name: Laura Smith   Number of children: 1   Years of education: Not on file   Highest education level: Associate degree: occupational, technical, or vocational program  Occupational History   Not on file  Tobacco Use   Smoking status: Never   Smokeless tobacco: Never  Vaping Use   Vaping Use: Never used  Substance and Sexual Activity   Alcohol use: No    Alcohol/week: 0.0 standard drinks   Drug use: No   Sexual activity: Yes    Birth control/protection: Post-menopausal    Comment: Pt declined sexual hx questions  Other Topics Concern   Not on file  Social History Narrative   h h of 2 Pet cat   Daughter /  Married    g1 p1   Works at [employer] Strain: Not on [employer] Insecurity: Not on file  Transportation Needs: Not on file  Physical Activity: Not on file  Stress: Not on file  Social Connections: Not on file    Outpatient Medications Prior to Visit  Medication Sig Dispense Refill   Ascorbic Acid (VITAMIN C) 500 MG CAPS Take 1,000 mg by mouth every morning.      b complex vitamins  tablet Take 1 tablet by mouth daily.     CALCIUM-MAGNESIUM PO Take 3 tablets by mouth daily.      cetirizine (ZYRTEC) 10 MG tablet Take 10 mg by mouth daily.     cyclobenzaprine (FLEXERIL) 10 MG tablet Take 10 mg by mouth as needed for muscle spasms.      hydrochlorothiazide (HYDRODIURIL) 25 MG tablet TAKE 1 TABLET BY MOUTH DAILY 90 tablet 0   levothyroxine (SYNTHROID) 50 MCG tablet Take 1 tablet (50 mcg total) by mouth daily. 90 tablet 2   meclizine (ANTIVERT) 25 MG tablet TAKE 2 TABLET (50 MG TOTAL) BY MOUTH 2 (TWO) TIMES DAILY AS NEEDED (FOR TRAVEL). 30 tablet 0   Misc Natural Products (RED WINE EXTRACT PO) Take by mouth.     Multiple Vitamins-Iron (MULTIVITAMIN/IRON) TABS Take 1 tablet by mouth daily.     nystatin (NYSTATIN) powder Apply topically 4 (four) times daily. As needed for rash 30 g 2   oxyCODONE-acetaminophen (PERCOCET/ROXICET) 5-325 MG tablet Take 1 tablet by mouth every 4 (four) hours as needed.     Rimegepant Sulfate (NURTEC) 75 MG TBDP Take 1 tablet by mouth as needed (for migraine). 8 tablet 6   sulindac (CLINORIL) 200 MG tablet Take 1 tablet (200 mg total) by mouth 2 (two) times daily as needed (headache). Reported on 09/23/2015 30 tablet 3   SUMAtriptan (IMITREX) 6 MG/0.5ML SOLN injection Inject 6 mg into the skin every 2 (two) hours as needed for migraine. Reported on 09/23/2015     traMADol (ULTRAM) 50 MG tablet Take 50 mg by mouth as needed for severe pain.      verapamil (CALAN-SR) 180 MG CR tablet TAKE 1 TABLET BY MOUTH AT BEDTIME 90 tablet 0   Facility-Administered Medications Prior to Visit  Medication Dose Route Frequency Provider Last Rate Last Admin   methylPREDNISolone acetate (DEPO-MEDROL) injection 120 mg  120 mg Intramuscular Once Levia Waltermire, [name], MD         EXAM:  BP 116/74 (BP Location: Right Arm, Patient Position: Sitting, Cuff Size: Normal)    Pulse 76    Temp 98.7 F (37.1 C) (Oral)    Ht 5' 7.5" (1.715 m)    Wt 214  lb (97.1 kg)    SpO2 98%    BMI 33.02  kg/m   Body mass index is 33.02 kg/m. Wt Readings from Last 3 Encounters:  06/11/21 214 lb (97.1 kg)  06/06/21 216 lb 6.4 oz (98.2 kg)  04/01/21 215 lb (97.5 kg)    Physical Exam: Vital signs reviewed EPP:IRJJ is a well-developed well-nourished alert cooperative    who appearsr stated age in no acute distress.  HEENT: normocephalic atraumatic , Eyes: PERRL EOM's full, conjunctiva clear, Nares: paten,t no deformity discharge or tenderness., Ears: no deformity EAC's clear TMs with normal landmarks. Mouth:masked  NECK: supple without masses, thyromegaly or bruits. CHEST/PULM:  Clear to auscultation and percussion breath sounds equal no wheeze , rales or rhonchi. No chest wall deformities or tenderness. Breast: deferred  to  gyne CV: PMI is nondisplaced, S1 S2 no gallops, murmurs, rubs. Peripheral pulses are full without delay.No JVD .  ABDOMEN: Bowel sounds normal nontender  No guard or rebound, no hepato splenomegal no CVA tenderness.   Extremtities:  No clubbing cyanosis or edema, no acute joint swelling or redness no focal atrophy NEURO:  Oriented x3, cranial nerves 3-12 appear to be intact, no obvious focal weakness,gait within normal limits no abnormal reflexes or asymmetrical SKIN: No acute rashes normal turgor, color, no bruising or petechiae. PSYCH: Oriented, good eye contact, no obvious depression anxiety, cognition and judgment appear normal. LN: no cervical axillary inguinal adenopathy  Lab Results  Component Value Date   WBC 9.5 06/11/2021   HGB 14.2 06/11/2021   HCT 42.5 06/11/2021   PLT 328.0 06/11/2021   GLUCOSE 85 06/11/2021   CHOL 196 06/11/2021   TRIG 114.0 06/11/2021   HDL 60.70 06/11/2021   LDLDIRECT 133.8 04/07/2013   LDLCALC 112 (H) 06/11/2021   ALT 20 06/11/2021   AST 25 06/11/2021   NA 137 06/11/2021   K 3.8 06/11/2021   CL 99 06/11/2021   CREATININE 0.85 06/11/2021   BUN 17 06/11/2021   CO2 31 06/11/2021   TSH 1.92 06/11/2021   HGBA1C 6.0 06/11/2021     BP Readings from Last 3 Encounters:  06/11/21 116/74  06/06/21 130/84  05/03/21 (!) 150/77    Lab plan reviewed with patient   ASSESSMENT AND PLAN:  Discussed the following assessment and plan:    ICD-10-CM   1. Visit for preventive health examination  O84.16 Basic metabolic panel    CBC with Differential/Platelet    Hepatic function panel    Lipid panel    TSH    Hemoglobin S0Y    Basic metabolic panel    CBC with Differential/Platelet    Hepatic function panel    Lipid panel    TSH    Hemoglobin A1c    2. Primary hypertension  T01 Basic metabolic panel    CBC with Differential/Platelet    Hepatic function panel    Lipid panel    TSH    Hemoglobin S0F    Basic metabolic panel    CBC with Differential/Platelet    Hepatic function panel    Lipid panel    TSH    Hemoglobin A1c    3. Fasting hyperglycemia  U93.23 Basic metabolic panel    CBC with Differential/Platelet    Hepatic function panel    Lipid panel    TSH    Hemoglobin F5D    Basic metabolic panel    CBC with Differential/Platelet    Hepatic function panel    Lipid panel  TSH    Hemoglobin A1c    4. Other migraine without status migrainosus, not intractable  J03.159 Basic metabolic panel    CBC with Differential/Platelet    Hepatic function panel    Lipid panel    TSH    Hemoglobin Y5O    Basic metabolic panel    CBC with Differential/Platelet    Hepatic function panel    Lipid panel    TSH    Hemoglobin A1c    5. Medication management  P92.924 Basic metabolic panel    CBC with Differential/Platelet    Hepatic function panel    Lipid panel    TSH    Hemoglobin M6K    Basic metabolic panel    CBC with Differential/Platelet    Hepatic function panel    Lipid panel    TSH    Hemoglobin A1c    6. Thyroid function test abnormal  M63.8 Basic metabolic panel    CBC with Differential/Platelet    Hepatic function panel    Lipid panel    TSH    Hemoglobin T7R    Basic metabolic  panel    CBC with Differential/Platelet    Hepatic function panel    Lipid panel    TSH    Hemoglobin A1c    7. Hypothyroidism, unspecified type  N16.5 Basic metabolic panel    CBC with Differential/Platelet    Hepatic function panel    Lipid panel    TSH    Hemoglobin B9U    Basic metabolic panel    CBC with Differential/Platelet    Hepatic function panel    Lipid panel    TSH    Hemoglobin A1c    8. History of back surgery  Z98.890    Dr Arnoldo Morale    At this time continue same medication Attention to lifestyle to continue Reassuring that her headaches are improving with relative rest and meds Neur Return in about 1 year (around 06/11/2022) for preventive /cpx and medications, depending on results.  Patient Care Team: Keevan Wolz, [name], MD as PCP - General (Internal Medicine) Phineas Real, Belinda Block, MD (Inactive) as Attending Physician (Obstetrics and Gynecology) Jarome Matin, MD as Consulting Physician (Dermatology) Thalia Bloodgood, OD as Referring Physician (Optometry) Patient Instructions  Good to see  you today  Continue attention lifestyle intervention healthy eating and activity .  Glad your migraines are better .       [name]. Shaneal Barasch M.D.

## 2021-06-11 ENCOUNTER — Ambulatory Visit (INDEPENDENT_AMBULATORY_CARE_PROVIDER_SITE_OTHER): Payer: BC Managed Care – PPO | Admitting: Internal Medicine

## 2021-06-11 ENCOUNTER — Encounter: Payer: Self-pay | Admitting: Internal Medicine

## 2021-06-11 VITALS — BP 116/74 | HR 76 | Temp 98.7°F | Ht 67.5 in | Wt 214.0 lb

## 2021-06-11 DIAGNOSIS — Z79899 Other long term (current) drug therapy: Secondary | ICD-10-CM

## 2021-06-11 DIAGNOSIS — R946 Abnormal results of thyroid function studies: Secondary | ICD-10-CM

## 2021-06-11 DIAGNOSIS — G43809 Other migraine, not intractable, without status migrainosus: Secondary | ICD-10-CM | POA: Diagnosis not present

## 2021-06-11 DIAGNOSIS — R7301 Impaired fasting glucose: Secondary | ICD-10-CM

## 2021-06-11 DIAGNOSIS — I10 Essential (primary) hypertension: Secondary | ICD-10-CM | POA: Diagnosis not present

## 2021-06-11 DIAGNOSIS — Z9889 Other specified postprocedural states: Secondary | ICD-10-CM

## 2021-06-11 DIAGNOSIS — Z0000 Encounter for general adult medical examination without abnormal findings: Secondary | ICD-10-CM | POA: Diagnosis not present

## 2021-06-11 DIAGNOSIS — R911 Solitary pulmonary nodule: Secondary | ICD-10-CM | POA: Insufficient documentation

## 2021-06-11 DIAGNOSIS — E039 Hypothyroidism, unspecified: Secondary | ICD-10-CM

## 2021-06-11 LAB — LIPID PANEL
Cholesterol: 196 mg/dL (ref 0–200)
HDL: 60.7 mg/dL (ref >39.00)
LDL Cholesterol: 112 mg/dL — ABNORMAL HIGH (ref 0–99)
NonHDL: 135.24
Total CHOL/HDL Ratio: 3
Triglycerides: 114 mg/dL (ref 0.0–149.0)
VLDL: 22.8 mg/dL (ref 0.0–40.0)

## 2021-06-11 LAB — CBC WITH DIFFERENTIAL/PLATELET
Basophils Absolute: 0.1 10*3/uL (ref 0.0–0.1)
Basophils Relative: 0.7 % (ref 0.0–3.0)
Eosinophils Absolute: 0.7 10*3/uL (ref 0.0–0.7)
Eosinophils Relative: 6.9 % — ABNORMAL HIGH (ref 0.0–5.0)
HCT: 42.5 % (ref 36.0–46.0)
Hemoglobin: 14.2 g/dL (ref 12.0–15.0)
Lymphocytes Relative: 30.4 % (ref 12.0–46.0)
Lymphs Abs: 2.9 10*3/uL (ref 0.7–4.0)
MCHC: 33.4 g/dL (ref 30.0–36.0)
MCV: 86.6 fl (ref 78.0–100.0)
Monocytes Absolute: 0.7 10*3/uL (ref 0.1–1.0)
Monocytes Relative: 6.9 % (ref 3.0–12.0)
Neutro Abs: 5.2 10*3/uL (ref 1.4–7.7)
Neutrophils Relative %: 55.1 % (ref 43.0–77.0)
Platelets: 328 10*3/uL (ref 150.0–400.0)
RBC: 4.91 Mil/uL (ref 3.87–5.11)
RDW: 13.8 % (ref 11.5–15.5)
WBC: 9.5 10*3/uL (ref 4.0–10.5)

## 2021-06-11 LAB — HEPATIC FUNCTION PANEL
ALT: 20 U/L (ref 0–35)
AST: 25 U/L (ref 0–37)
Albumin: 4.2 g/dL (ref 3.5–5.2)
Alkaline Phosphatase: 59 U/L (ref 39–117)
Bilirubin, Direct: 0.1 mg/dL (ref 0.0–0.3)
Total Bilirubin: 0.7 mg/dL (ref 0.2–1.2)
Total Protein: 7.2 g/dL (ref 6.0–8.3)

## 2021-06-11 LAB — BASIC METABOLIC PANEL
BUN: 17 mg/dL (ref 6–23)
CO2: 31 mEq/L (ref 19–32)
Calcium: 9.5 mg/dL (ref 8.4–10.5)
Chloride: 99 mEq/L (ref 96–112)
Creatinine, Ser: 0.85 mg/dL (ref 0.40–1.20)
GFR: 73.97 mL/min (ref >60.00)
Glucose, Bld: 85 mg/dL (ref 70–99)
Potassium: 3.8 mEq/L (ref 3.5–5.1)
Sodium: 137 mEq/L (ref 135–145)

## 2021-06-11 LAB — TSH: TSH: 1.92 u[IU]/mL (ref 0.35–5.50)

## 2021-06-11 LAB — HEMOGLOBIN A1C: Hgb A1c MFr Bld: 6 % (ref 4.6–6.5)

## 2021-06-11 NOTE — Patient Instructions (Addendum)
Good to see  you today  Continue attention lifestyle intervention healthy eating and activity .  Glad your migraines are better .

## 2021-06-11 NOTE — Progress Notes (Signed)
Thyroid in range , cholesterol improved ,blood count kidney function nl  hg a1c  not diabetic but is elevated like prediabetes. Continue lifestyle intervention healthy eating and exercise .  And do yearly blood checks

## 2021-06-25 ENCOUNTER — Telehealth: Payer: Self-pay | Admitting: Hematology

## 2021-06-25 NOTE — Telephone Encounter (Signed)
Rescheduled 01/06 appointment to 01/11, patient has been called and voicemail was left.

## 2021-06-27 ENCOUNTER — Other Ambulatory Visit: Payer: BC Managed Care – PPO

## 2021-06-27 ENCOUNTER — Ambulatory Visit: Payer: BC Managed Care – PPO | Admitting: Hematology

## 2021-06-30 ENCOUNTER — Encounter: Payer: Self-pay | Admitting: Internal Medicine

## 2021-06-30 ENCOUNTER — Telehealth: Payer: Self-pay | Admitting: Hematology

## 2021-06-30 NOTE — Telephone Encounter (Signed)
R/Sch per 1/6 inbasket, pt aware

## 2021-07-02 ENCOUNTER — Other Ambulatory Visit: Payer: BC Managed Care – PPO

## 2021-07-02 ENCOUNTER — Ambulatory Visit: Payer: BC Managed Care – PPO | Admitting: Hematology

## 2021-07-03 ENCOUNTER — Other Ambulatory Visit: Payer: Self-pay

## 2021-07-03 DIAGNOSIS — Z1239 Encounter for other screening for malignant neoplasm of breast: Secondary | ICD-10-CM

## 2021-07-03 DIAGNOSIS — Z9189 Other specified personal risk factors, not elsewhere classified: Secondary | ICD-10-CM

## 2021-07-04 ENCOUNTER — Inpatient Hospital Stay: Payer: BC Managed Care – PPO | Admitting: Hematology

## 2021-07-04 ENCOUNTER — Encounter: Payer: Self-pay | Admitting: Hematology

## 2021-07-04 ENCOUNTER — Ambulatory Visit (INDEPENDENT_AMBULATORY_CARE_PROVIDER_SITE_OTHER): Payer: BC Managed Care – PPO | Admitting: Obstetrics & Gynecology

## 2021-07-04 ENCOUNTER — Inpatient Hospital Stay: Payer: BC Managed Care – PPO | Attending: Hematology

## 2021-07-04 ENCOUNTER — Other Ambulatory Visit: Payer: Self-pay

## 2021-07-04 ENCOUNTER — Other Ambulatory Visit (HOSPITAL_COMMUNITY)
Admission: RE | Admit: 2021-07-04 | Discharge: 2021-07-04 | Disposition: A | Discharge status: Home / Self Care | Payer: BC Managed Care – PPO | Source: Ambulatory Visit | Attending: Obstetrics & Gynecology | Admitting: Obstetrics & Gynecology

## 2021-07-04 ENCOUNTER — Ambulatory Visit: Payer: BC Managed Care – PPO | Admitting: Obstetrics and Gynecology

## 2021-07-04 ENCOUNTER — Encounter: Payer: Self-pay | Admitting: Obstetrics & Gynecology

## 2021-07-04 VITALS — BP 122/76 | Ht 67.0 in | Wt 217.0 lb

## 2021-07-04 VITALS — BP 152/83 | HR 81 | Temp 98.4°F | Resp 18 | Wt 218.2 lb

## 2021-07-04 DIAGNOSIS — E6609 Other obesity due to excess calories: Secondary | ICD-10-CM

## 2021-07-04 DIAGNOSIS — Z01419 Encounter for gynecological examination (general) (routine) without abnormal findings: Secondary | ICD-10-CM

## 2021-07-04 DIAGNOSIS — R300 Dysuria: Secondary | ICD-10-CM | POA: Diagnosis not present

## 2021-07-04 DIAGNOSIS — Z1501 Genetic susceptibility to malignant neoplasm of breast: Secondary | ICD-10-CM | POA: Insufficient documentation

## 2021-07-04 DIAGNOSIS — Z888 Allergy status to other drugs, medicaments and biological substances status: Secondary | ICD-10-CM | POA: Insufficient documentation

## 2021-07-04 DIAGNOSIS — Z803 Family history of malignant neoplasm of breast: Secondary | ICD-10-CM | POA: Diagnosis not present

## 2021-07-04 DIAGNOSIS — Z780 Asymptomatic menopausal state: Secondary | ICD-10-CM

## 2021-07-04 DIAGNOSIS — Z87440 Personal history of urinary (tract) infections: Secondary | ICD-10-CM | POA: Insufficient documentation

## 2021-07-04 DIAGNOSIS — I10 Essential (primary) hypertension: Secondary | ICD-10-CM | POA: Diagnosis not present

## 2021-07-04 DIAGNOSIS — Z800 Family history of malignant neoplasm of digestive organs: Secondary | ICD-10-CM | POA: Diagnosis not present

## 2021-07-04 DIAGNOSIS — Z85828 Personal history of other malignant neoplasm of skin: Secondary | ICD-10-CM | POA: Insufficient documentation

## 2021-07-04 DIAGNOSIS — Z8042 Family history of malignant neoplasm of prostate: Secondary | ICD-10-CM | POA: Insufficient documentation

## 2021-07-04 DIAGNOSIS — Z9189 Other specified personal risk factors, not elsewhere classified: Secondary | ICD-10-CM

## 2021-07-04 DIAGNOSIS — B372 Candidiasis of skin and nail: Secondary | ICD-10-CM

## 2021-07-04 DIAGNOSIS — Z8719 Personal history of other diseases of the digestive system: Secondary | ICD-10-CM | POA: Insufficient documentation

## 2021-07-04 DIAGNOSIS — Z79899 Other long term (current) drug therapy: Secondary | ICD-10-CM | POA: Insufficient documentation

## 2021-07-04 DIAGNOSIS — Z86010 Personal history of colonic polyps: Secondary | ICD-10-CM | POA: Insufficient documentation

## 2021-07-04 DIAGNOSIS — R922 Inconclusive mammogram: Secondary | ICD-10-CM | POA: Insufficient documentation

## 2021-07-04 DIAGNOSIS — Z6833 Body mass index (BMI) 33.0-33.9, adult: Secondary | ICD-10-CM

## 2021-07-04 DIAGNOSIS — Z9049 Acquired absence of other specified parts of digestive tract: Secondary | ICD-10-CM | POA: Diagnosis not present

## 2021-07-04 LAB — CMP (CANCER CENTER ONLY)
ALT: 17 U/L (ref 0–44)
AST: 20 U/L (ref 15–41)
Albumin: 4.2 g/dL (ref 3.5–5.0)
Alkaline Phosphatase: 67 U/L (ref 38–126)
Anion gap: 7 (ref 5–15)
BUN: 18 mg/dL (ref 8–23)
CO2: 30 mmol/L (ref 22–32)
Calcium: 9.5 mg/dL (ref 8.9–10.3)
Chloride: 102 mmol/L (ref 98–111)
Creatinine: 0.93 mg/dL (ref 0.44–1.00)
GFR, Estimated: >60 mL/min (ref >60)
Glucose, Bld: 104 mg/dL — ABNORMAL HIGH (ref 70–99)
Potassium: 3.8 mmol/L (ref 3.5–5.1)
Sodium: 139 mmol/L (ref 135–145)
Total Bilirubin: 0.4 mg/dL (ref 0.3–1.2)
Total Protein: 7.2 g/dL (ref 6.5–8.1)

## 2021-07-04 LAB — CBC WITH DIFFERENTIAL (CANCER CENTER ONLY)
Abs Immature Granulocytes: 0.02 10*3/uL (ref 0.00–0.07)
Basophils Absolute: 0.1 10*3/uL (ref 0.0–0.1)
Basophils Relative: 1 %
Eosinophils Absolute: 1.7 10*3/uL — ABNORMAL HIGH (ref 0.0–0.5)
Eosinophils Relative: 15 %
HCT: 42.7 % (ref 36.0–46.0)
Hemoglobin: 14.2 g/dL (ref 12.0–15.0)
Immature Granulocytes: 0 %
Lymphocytes Relative: 29 %
Lymphs Abs: 3.2 10*3/uL (ref 0.7–4.0)
MCH: 29 pg (ref 26.0–34.0)
MCHC: 33.3 g/dL (ref 30.0–36.0)
MCV: 87.1 fL (ref 80.0–100.0)
Monocytes Absolute: 0.7 10*3/uL (ref 0.1–1.0)
Monocytes Relative: 6 %
Neutro Abs: 5.5 10*3/uL (ref 1.7–7.7)
Neutrophils Relative %: 49 %
Platelet Count: 345 10*3/uL (ref 150–400)
RBC: 4.9 MIL/uL (ref 3.87–5.11)
RDW: 13.6 % (ref 11.5–15.5)
WBC Count: 11.1 10*3/uL — ABNORMAL HIGH (ref 4.0–10.5)
nRBC: 0 % (ref 0.0–0.2)

## 2021-07-04 MED ORDER — NYSTATIN 100000 UNIT/GM EX POWD: Freq: Two times a day (BID) | CUTANEOUS | 5 refills | Status: AC

## 2021-07-04 NOTE — Progress Notes (Signed)
[place]   [phone] Fax:([phone]) [phone]   Clinic Follow up Note   Patient Care Team: Panosh, [name], MD as PCP - General (Internal Medicine) Phineas Real, Belinda Block, MD (Inactive) as Attending Physician (Obstetrics and Gynecology) Jarome Matin, MD as Consulting Physician (Dermatology) Thalia Bloodgood, OD as Referring Physician (Optometry)  Date of Service:  07/04/2021  CHIEF COMPLAINT: f/u of high risk for breast cancer  CURRENT THERAPY:  Surveillance  ASSESSMENT & PLAN:  Laura Smith is a 61 y.o. female with   1. High Risk Breast for Breast Cancer, 22.7% lifetime risk -she has a strong family history of cancer-- mother with history of colorectal cancer and breast cancer, paternal grandfather with prostate cancer, and a paternal cousin with breast cancer. -she was referred to genetics in 01/2020 due to her family history. Using the [employer] statistic model to estimate her risk of developing breast cancer, her lifetime risk would be approximately 22.7%. Her genetic testing results were negative. -Based on the American Cancer Society recommendations for high risk (defined as 20% or greater lifetime risk of developing breast cancer), she should consider breast MRI screening as an adjunct to mammography annually.  -we discussed the issue of the high co-pay for MRI. I reviewed the option of abbreviated MRI, which would be cheaper for her. We also discussed how long to continue the MRI, and reviewed the risks and benefits. After discussion, she will proceed with scheduled MRI tomorrow then repeat in 2 years. She knows to continue annual mammography. -she is clinically doing well. Physical exam was unremarkable. I feel comfortable with her continuing f/u with her GYN and PCP. I will see her back in 1 year for routine f/u.       PLAN:  -proceed with breast MRI tomorrow -mammogram due 12/2021 -f/u with GYN and PCP -f/u with me in 1 year (no lab)   No  problem-specific Assessment & Plan notes found for this encounter.   INTERVAL HISTORY:  Laura Smith is here for a follow up of high risk for breast cancer. She was last seen by me a year ago in consultation with PA Cassie. She presents to the clinic alone. She reports she is doing well overall; she denies any new breast concerns. She did report financial concerns with the expense of her previous breast MRI.   All other systems were reviewed with the patient and are negative.  MEDICAL HISTORY:  Past Medical History:  Diagnosis Date   Allergy    Breast cyst    Endometriosis    surgery laser 2001   Family history of breast cancer    Family history of colon cancer    Family history of prostate cancer    Hemorrhoids    Hx of basal cell carcinoma    Hypertension    Migraine    Thyroid disease    Urinary tract infection     SURGICAL HISTORY: Past Surgical History:  Procedure Laterality Date   APPENDECTOMY  1992   AT C/S   BACK SURGERY  06/06/2020   L3-4   CESAREAN SECTION  1992   APPENDECTOMY SAME TIME   COLONOSCOPY  2015   HYSTEROSCOPY  08/1999   AT TIME OF LAPAROSCOPY   PELVIC LAPAROSCOPY  08/1999   AND HYSTEROSCOPY--ENDOMETRIOSIS   POLYPECTOMY      I have reviewed the social history and family history with the patient and they are unchanged from previous note.  ALLERGIES:  is allergic to etodolac,  latex, other, and adhesive [tape].  MEDICATIONS:  Current Outpatient Medications  Medication Sig Dispense Refill   Ascorbic Acid (VITAMIN C) 500 MG CAPS Take 1,000 mg by mouth every morning.      b complex vitamins tablet Take 1 tablet by mouth daily.     CALCIUM-MAGNESIUM PO Take 3 tablets by mouth daily.      cetirizine (ZYRTEC) 10 MG tablet Take 10 mg by mouth daily.     cyclobenzaprine (FLEXERIL) 10 MG tablet Take 10 mg by mouth as needed for muscle spasms.      hydrochlorothiazide (HYDRODIURIL) 25 MG tablet TAKE 1 TABLET BY MOUTH DAILY 90 tablet 0    levothyroxine (SYNTHROID) 50 MCG tablet Take 1 tablet (50 mcg total) by mouth daily. 90 tablet 2   meclizine (ANTIVERT) 25 MG tablet TAKE 2 TABLET (50 MG TOTAL) BY MOUTH 2 (TWO) TIMES DAILY AS NEEDED (FOR TRAVEL). 30 tablet 0   Misc Natural Products (RED WINE EXTRACT PO) Take by mouth.     Multiple Vitamins-Iron (MULTIVITAMIN/IRON) TABS Take 1 tablet by mouth daily.     nystatin powder Apply topically 2 (two) times daily. As needed for rash 30 g 5   oxyCODONE-acetaminophen (PERCOCET/ROXICET) 5-325 MG tablet Take 1 tablet by mouth every 4 (four) hours as needed. (Patient not taking: Reported on 07/04/2021)     Rimegepant Sulfate (NURTEC) 75 MG TBDP Take 1 tablet by mouth as needed (for migraine). 8 tablet 6   sulindac (CLINORIL) 200 MG tablet Take 1 tablet (200 mg total) by mouth 2 (two) times daily as needed (headache). Reported on 09/23/2015 30 tablet 3   SUMAtriptan (IMITREX) 6 MG/0.5ML SOLN injection Inject 6 mg into the skin every 2 (two) hours as needed for migraine. Reported on 09/23/2015 (Patient not taking: Reported on 07/04/2021)     traMADol (ULTRAM) 50 MG tablet Take 50 mg by mouth as needed for severe pain.      verapamil (CALAN-SR) 180 MG CR tablet TAKE 1 TABLET BY MOUTH AT BEDTIME 90 tablet 0   Current Facility-Administered Medications  Medication Dose Route Frequency Provider Last Rate Last Admin   methylPREDNISolone acetate (DEPO-MEDROL) injection 120 mg  120 mg Intramuscular Once Panosh, [name], MD        PHYSICAL EXAMINATION: ECOG PERFORMANCE STATUS: 0 - Asymptomatic  Vitals:   07/04/21 1542  BP: (!) 152/83  Pulse: 81  Resp: 18  Temp: 98.4 F (36.9 C)  SpO2: 97%   Wt Readings from Last 3 Encounters:  07/04/21 218 lb 3 oz (99 kg)  07/04/21 217 lb (98.4 kg)  06/11/21 214 lb (97.1 kg)     GENERAL:alert, no distress and comfortable SKIN: skin color, texture, turgor are normal, no rashes or significant lesions EYES: normal, Conjunctiva are pink and non-injected, sclera  clear  NECK: supple, thyroid normal size, non-tender, without nodularity LYMPH:  no palpable lymphadenopathy in the cervical, axillary  LUNGS: clear to auscultation and percussion with normal breathing effort HEART: regular rate & rhythm and no murmurs and no lower extremity edema ABDOMEN:abdomen soft, non-tender and normal bowel sounds Musculoskeletal:no cyanosis of digits and no clubbing  NEURO: alert & oriented x 3 with fluent speech, no focal motor/sensory deficits BREAST: No palpable mass, nodules or adenopathy bilaterally. Breast exam benign.   LABORATORY DATA:  I have reviewed the data as listed CBC Latest Ref Rng & Units 07/04/2021 06/11/2021 05/24/2020  WBC 4.0 - 10.5 K/uL 11.1(H) 9.5 8.9  Hemoglobin 12.0 - 15.0 g/dL 14.2 14.2 14.9  Hematocrit 36.0 - 46.0 % 42.7 42.5 44.7  Platelets 150 - 400 K/uL 345 328.0 342     CMP Latest Ref Rng & Units 07/04/2021 06/11/2021 05/24/2020  Glucose 70 - 99 mg/dL 104(H) 85 98  BUN 8 - 23 mg/dL 18 17 17   Creatinine 0.44 - 1.00 mg/dL 0.93 0.85 0.94  Sodium 135 - 145 mmol/L 139 137 137  Potassium 3.5 - 5.1 mmol/L 3.8 3.8 3.8  Chloride 98 - 111 mmol/L 102 99 99  CO2 22 - 32 mmol/L 30 31 27   Calcium 8.9 - 10.3 mg/dL 9.5 9.5 9.7  Total Protein 6.5 - 8.1 g/dL 7.2 7.2 7.0  Total Bilirubin 0.3 - 1.2 mg/dL 0.4 0.7 0.6  Alkaline Phos 38 - 126 U/L 67 59 -  AST 15 - 41 U/L 20 25 25   ALT 0 - 44 U/L 17 20 24       RADIOGRAPHIC STUDIES: I have personally reviewed the radiological images as listed and agreed with the findings in the report. No results found.    No orders of the defined types were placed in this encounter.  All questions were answered. The patient knows to call the clinic with any problems, questions or concerns. No barriers to learning was detected. The total time spent in the appointment was 20 minutes.     Truitt Merle, MD 07/04/2021   I, Wilburn Mylar, am acting as scribe for Truitt Merle, MD.   I have reviewed the above  documentation for accuracy and completeness, and I agree with the above.

## 2021-07-04 NOTE — Progress Notes (Signed)
Laura Smith 01-05-1961 [id number]   History:    61 y.o. G1P1L1 Married  RP:  Established patient presenting for annual gyn exam   HPI: Postmenopausal.  No significant hot flashes or night sweats.  No vaginal bleeding.  No pelvic pain. Pap smear/HPV Neg 05/2018.  No significant history of abnormal Pap smears. Tends to have yeast of skin under the breasts during the warmer months, using Nystatin powder.  Breasts normal.  Mammogram Neg 12/2020.  Patient had a genetic counseling session 01/2020 with calculated lifetime risk of breast cancer at 22.7%, considered high risk.  Her expanded genetic mutation panel was negative. Doing Breast MRIs annually.  Followed by Dr. Burr Medico in oncology.  Colonoscopy 07/2020.  BMI 33.99.  Health labs with Fam MD.   Past medical history,surgical history, family history and social history were all reviewed and documented in the EPIC chart.  Gynecologic History No LMP recorded. Patient is postmenopausal.  Obstetric History OB History  Gravida Para Term Preterm AB Living  1 1 1     1   SAB IAB Ectopic Multiple Live Births               # Outcome Date GA Lbr Len/2nd Weight Sex Delivery Anes PTL Lv  1 Term              ROS: A ROS was performed and pertinent positives and negatives are included in the history.  GENERAL: No fevers or chills. HEENT: No change in vision, no earache, sore throat or sinus congestion. NECK: No pain or stiffness. CARDIOVASCULAR: No chest pain or pressure. No palpitations. PULMONARY: No shortness of breath, cough or wheeze. GASTROINTESTINAL: No abdominal pain, nausea, vomiting or diarrhea, melena or bright red blood per rectum. GENITOURINARY: No urinary frequency, urgency, hesitancy or dysuria. MUSCULOSKELETAL: No joint or muscle pain, no back pain, no recent trauma. DERMATOLOGIC: No rash, no itching, no lesions. ENDOCRINE: No polyuria, polydipsia, no heat or cold intolerance. No recent change in weight. HEMATOLOGICAL: No anemia or easy  bruising or bleeding. NEUROLOGIC: No headache, seizures, numbness, tingling or weakness. PSYCHIATRIC: No depression, no loss of interest in normal activity or change in sleep pattern.     Exam:   BP 122/76    Ht 5\' 7"  (1.702 m)    Wt 217 lb (98.4 kg)    BMI 33.99 kg/m   Body mass index is 33.99 kg/m.  General appearance : Well developed well nourished female. No acute distress HEENT: Eyes: no retinal hemorrhage or exudates,  Neck supple, trachea midline, no carotid bruits, no thyroidmegaly Lungs: Clear to auscultation, no rhonchi or wheezes, or rib retractions  Heart: Regular rate and rhythm, no murmurs or gallops Breast:Examined in sitting and supine position were symmetrical in appearance, no palpable masses or tenderness,  no skin retraction, no nipple inversion, no nipple discharge, no skin discoloration, no axillary or supraclavicular lymphadenopathy Abdomen: no palpable masses or tenderness, no rebound or guarding Extremities: no edema or skin discoloration or tenderness  Pelvic: Vulva: Normal             Vagina: No gross lesions or discharge  Cervix: No gross lesions or discharge.  Pap reflex done.  Uterus  AV, normal size, shape and consistency, non-tender and mobile  Adnexa  Without masses or tenderness  Anus: Normal  U/A:   Assessment/Plan:  61 y.o. female for annual exam   1. Encounter for routine gynecological examination with Papanicolaou smear of cervix Postmenopausal.  No significant hot flashes  or night sweats.  No vaginal bleeding.  No pelvic pain. Pap smear/HPV Neg 05/2018.  Pap reflex done today.  No significant history of abnormal Pap smears. Tends to have yeast of skin under the breasts during the warmer months, using Nystatin powder.  Breasts normal.  Mammogram Neg 12/2020.  Patient had a genetic counseling session 01/2020 with calculated lifetime risk of breast cancer at 22.7%, considered high risk.  Her expanded genetic mutation panel was negative. Doing Breast  MRIs annually.  Followed by Dr. Burr Medico in oncology.  Colonoscopy 07/2020.  BMI 33.99.  Health labs with Fam MD.  - Cytology - PAP(  [facility])  2. Postmenopause Postmenopausal.  No significant hot flashes or night sweats.  No vaginal bleeding.  No pelvic pain.   3. Dysuria Minimal abnormalities on U/A.  Will wait on U. Culture to decide if treatment is needed. - Urinalysis,Complete w/RFL Culture  4. Yeast infection of the skin Under the breast yeast infections during the warmer months.  Treating with Nystatin powder.  Prescription sent to pharmacy.  5. Class 1 obesity due to excess calories with serious comorbidity and body mass index (BMI) of 33.0 to 33.9 in adult Lower calorie/carb diet.  Regular fitness activities.  Other orders - nystatin powder; Apply topically 2 (two) times daily. As needed for rash   Princess Bruins MD, 1:49 PM 07/04/2021

## 2021-07-05 ENCOUNTER — Ambulatory Visit
Admission: RE | Admit: 2021-07-05 | Discharge: 2021-07-05 | Disposition: A | Discharge status: Home / Self Care | Payer: BC Managed Care – PPO | Source: Ambulatory Visit | Attending: Physician Assistant | Admitting: Physician Assistant

## 2021-07-05 DIAGNOSIS — Z9189 Other specified personal risk factors, not elsewhere classified: Secondary | ICD-10-CM

## 2021-07-05 DIAGNOSIS — R922 Inconclusive mammogram: Secondary | ICD-10-CM | POA: Diagnosis not present

## 2021-07-05 DIAGNOSIS — Z1239 Encounter for other screening for malignant neoplasm of breast: Secondary | ICD-10-CM | POA: Diagnosis not present

## 2021-07-05 MED ORDER — GADOBUTROL 1 MMOL/ML IV SOLN
9.0000 mL | Freq: Once | INTRAVENOUS | Status: AC | PRN
  Administered 2021-07-05: 9 mL via INTRAVENOUS

## 2021-07-06 LAB — URINALYSIS, COMPLETE W/RFL CULTURE
Bilirubin Urine: NEGATIVE
Glucose, UA: NEGATIVE
Hgb urine dipstick: NEGATIVE
Hyaline Cast: NONE SEEN /LPF
Ketones, ur: NEGATIVE
Leukocyte Esterase: NEGATIVE
Nitrites, Initial: NEGATIVE
Protein, ur: NEGATIVE
Specific Gravity, Urine: 1.025 (ref 1.001–1.035)
pH: 5 (ref 5.0–8.0)

## 2021-07-06 LAB — URINE CULTURE
MICRO NUMBER:: 12868540
SPECIMEN QUALITY:: ADEQUATE

## 2021-07-06 LAB — CULTURE INDICATED

## 2021-07-07 LAB — CYTOLOGY - PAP: Diagnosis: NEGATIVE

## 2021-07-08 ENCOUNTER — Telehealth: Payer: Self-pay | Admitting: Hematology

## 2021-07-08 ENCOUNTER — Encounter: Payer: Self-pay | Admitting: Hematology

## 2021-07-08 NOTE — Telephone Encounter (Signed)
Scheduled follow-up appointment per 11/3 los. Patient is aware. 

## 2021-07-10 DIAGNOSIS — L820 Inflamed seborrheic keratosis: Secondary | ICD-10-CM | POA: Diagnosis not present

## 2021-07-10 DIAGNOSIS — L821 Other seborrheic keratosis: Secondary | ICD-10-CM | POA: Diagnosis not present

## 2021-07-10 DIAGNOSIS — L308 Other specified dermatitis: Secondary | ICD-10-CM | POA: Diagnosis not present

## 2021-07-10 DIAGNOSIS — L814 Other melanin hyperpigmentation: Secondary | ICD-10-CM | POA: Diagnosis not present

## 2021-07-10 DIAGNOSIS — L738 Other specified follicular disorders: Secondary | ICD-10-CM | POA: Diagnosis not present

## 2021-08-18 ENCOUNTER — Other Ambulatory Visit: Payer: Self-pay | Admitting: Internal Medicine

## 2021-08-21 ENCOUNTER — Encounter: Payer: Self-pay | Admitting: Internal Medicine

## 2021-08-21 ENCOUNTER — Encounter: Payer: Self-pay | Admitting: Hematology

## 2021-08-21 MED ORDER — LEVOTHYROXINE SODIUM 50 MCG PO TABS: 50.0000 ug | ORAL_TABLET | Freq: Every day | ORAL | 2 refills | Status: DC

## 2021-09-14 DIAGNOSIS — Z20822 Contact with and (suspected) exposure to covid-19: Secondary | ICD-10-CM | POA: Diagnosis not present

## 2021-09-14 DIAGNOSIS — R509 Fever, unspecified: Secondary | ICD-10-CM | POA: Diagnosis not present

## 2021-09-14 DIAGNOSIS — J069 Acute upper respiratory infection, unspecified: Secondary | ICD-10-CM | POA: Diagnosis not present

## 2021-09-14 DIAGNOSIS — R070 Pain in throat: Secondary | ICD-10-CM | POA: Diagnosis not present

## 2021-11-07 ENCOUNTER — Telehealth: Payer: Self-pay | Admitting: Internal Medicine

## 2021-11-07 NOTE — Telephone Encounter (Signed)
Pt requesting an epipen, had one a while ago but it has expired. Explaining she mows the yard with her husband and he suggested she gets a pen since she has had a reaction in the past

## 2021-11-10 NOTE — Telephone Encounter (Signed)
Please document what she is allergic  to ( what has caused  anaphylaxis  in past)  . Who prescribed the epi pen in past .  Place on allergy list  and then can refill the epipen.  If cannot easily get this info I need to talk with her , since I have never  filled this med for her phone visit ok .

## 2021-11-10 NOTE — Telephone Encounter (Signed)
Left voicemail for patient to call the office 

## 2021-11-10 NOTE — Telephone Encounter (Signed)
Spoke with patient and she stated that she is allergic to bee stings and now is cutting the grass more often and she would rather be safe than sorry with and epipen.  Patient was informed that PCP has never prescribed and would need Vv or more information.  Vv scheduled 11/11/21 @ 4 pm

## 2021-11-10 NOTE — Telephone Encounter (Signed)
Last Ov 06/11/21 Please advise

## 2021-11-11 ENCOUNTER — Encounter: Payer: Self-pay | Admitting: Internal Medicine

## 2021-11-11 ENCOUNTER — Telehealth (INDEPENDENT_AMBULATORY_CARE_PROVIDER_SITE_OTHER): Payer: BC Managed Care – PPO | Admitting: Internal Medicine

## 2021-11-11 DIAGNOSIS — Z79899 Other long term (current) drug therapy: Secondary | ICD-10-CM

## 2021-11-11 DIAGNOSIS — T63481A Toxic effect of venom of other arthropod, accidental (unintentional), initial encounter: Secondary | ICD-10-CM | POA: Diagnosis not present

## 2021-11-11 MED ORDER — EPINEPHRINE 0.3 MG/0.3ML IJ SOAJ: 0.3000 mg | INTRAMUSCULAR | 1 refills | Status: DC | PRN

## 2021-11-11 NOTE — Progress Notes (Addendum)
Virtual Visit via Video Note  I connected with Laura Smith on 11/11/21 at  4:00 PM EDT by a video enabled telemedicine application and verified that I am speaking with the correct person using two identifiers. Location patient: home Location provider:work office Persons participating in the virtual visit: patient, provider    HPI: Laura Smith presents for video visit because requesting an EpiPen. Many years ago she had a sting in her foot when she was at [facility] that eventually ended up with a very large leg swelling and may be some shortness of breath that took her to the emergency room.  She was told at that time she should have an EpiPen and had it refilled over 3 years although never used.  Because of the cost and no further problems she had not kept this refill up.  However she is mowing the grass a good bit and acquaintances work and emergency services recommended she get an EpiPen refill. She has a remote history of allergy testing noted to be allergic to pollen and molds and others inhalants.  She had an anaphylactic reaction to etodolac.  ROS: See pertinent positives and negatives per HPI.  Past Medical History:  Diagnosis Date   Allergy    Breast cyst    Endometriosis    surgery laser 2001   Family history of breast cancer    Family history of colon cancer    Family history of prostate cancer    Hemorrhoids    Hx of basal cell carcinoma    Hypertension    Migraine    Thyroid disease    Urinary tract infection     Past Surgical History:  Procedure Laterality Date   APPENDECTOMY  1992   AT C/S   BACK SURGERY  06/06/2020   L3-4   CESAREAN SECTION  1992   APPENDECTOMY SAME TIME   COLONOSCOPY  2015   HYSTEROSCOPY  08/1999   AT TIME OF LAPAROSCOPY   PELVIC LAPAROSCOPY  08/1999   AND HYSTEROSCOPY--ENDOMETRIOSIS   POLYPECTOMY      Family History  Problem Relation Age of Onset   Breast cancer Mother        Age 66   Colon cancer Mother 71    Breast cancer Cousin 67       paternal first cousin   Heart disease Father 32       Heart attack   Hypertension Paternal Grandmother    Heart disease Paternal Grandmother    Stroke Paternal Grandmother    Cancer Paternal Grandmother        Lung   Cancer Paternal Grandfather        Prostate   Stroke Maternal Grandfather    COPD Maternal Uncle    Liver cancer Paternal Uncle    Congestive Heart Failure Maternal Grandmother    Hypertension Paternal Aunt    Colon polyps Neg Hx    Stomach cancer Neg Hx    Rectal cancer Neg Hx    Esophageal cancer Neg Hx     Social History   Tobacco Use   Smoking status: Never   Smokeless tobacco: Never  Vaping Use   Vaping Use: Never used  Substance Use Topics   Alcohol use: No    Alcohol/week: 0.0 standard drinks   Drug use: No      Current Outpatient Medications:    Ascorbic Acid (VITAMIN C) 500 MG CAPS, Take 1,000 mg by mouth every morning. , Disp: , Rfl:  b complex vitamins tablet, Take 1 tablet by mouth daily., Disp: , Rfl:    CALCIUM-MAGNESIUM PO, Take 3 tablets by mouth daily. , Disp: , Rfl:    cetirizine (ZYRTEC) 10 MG tablet, Take 10 mg by mouth daily., Disp: , Rfl:    cyclobenzaprine (FLEXERIL) 10 MG tablet, Take 10 mg by mouth as needed for muscle spasms. , Disp: , Rfl:    EPINEPHrine 0.3 mg/0.3 mL IJ SOAJ injection, Inject 0.3 mg into the muscle as needed for anaphylaxis., Disp: 1 each, Rfl: 1   hydrochlorothiazide (HYDRODIURIL) 25 MG tablet, TAKE 1 TABLET BY MOUTH DAILY, Disp: 90 tablet, Rfl: 1   levothyroxine (SYNTHROID) 50 MCG tablet, Take 1 tablet (50 mcg total) by mouth daily., Disp: 90 tablet, Rfl: 2   meclizine (ANTIVERT) 25 MG tablet, TAKE 2 TABLET (50 MG TOTAL) BY MOUTH 2 (TWO) TIMES DAILY AS NEEDED (FOR TRAVEL)., Disp: 30 tablet, Rfl: 0   Misc Natural Products (RED WINE EXTRACT PO), Take by mouth., Disp: , Rfl:    Multiple Vitamins-Iron (MULTIVITAMIN/IRON) TABS, Take 1 tablet by mouth daily., Disp: , Rfl:    nystatin  powder, Apply topically 2 (two) times daily. As needed for rash, Disp: 30 g, Rfl: 5   oxyCODONE-acetaminophen (PERCOCET/ROXICET) 5-325 MG tablet, Take 1 tablet by mouth every 4 (four) hours as needed., Disp: , Rfl:    Rimegepant Sulfate (NURTEC) 75 MG TBDP, Take 1 tablet by mouth as needed (for migraine)., Disp: 8 tablet, Rfl: 6   sulindac (CLINORIL) 200 MG tablet, Take 1 tablet (200 mg total) by mouth 2 (two) times daily as needed (headache). Reported on 09/23/2015, Disp: 30 tablet, Rfl: 3   SUMAtriptan (IMITREX) 6 MG/0.5ML SOLN injection, Inject 6 mg into the skin every 2 (two) hours as needed for migraine. Reported on 09/23/2015, Disp: , Rfl:    traMADol (ULTRAM) 50 MG tablet, Take 50 mg by mouth as needed for severe pain. , Disp: , Rfl:    verapamil (CALAN-SR) 180 MG CR tablet, TAKE 1 TABLET BY MOUTH AT BEDTIME, Disp: 90 tablet, Rfl: 1  Current Facility-Administered Medications:    methylPREDNISolone acetate (DEPO-MEDROL) injection 120 mg, 120 mg, Intramuscular, Once, Izaac Reisig, [name], MD  EXAM: BP Readings from Last 3 Encounters:  07/04/21 (!) 152/83  07/04/21 122/76  06/11/21 116/74    VITALS per patient if applicable:  GENERAL: alert, oriented, appears well and in no acute distress  HEENT: atraumatic, conjunttiva clear, no obvious abnormalities on inspection of external nose and ears  NECK: normal movements of the head and neck  LUNGS: on inspection no signs of respiratory distress, breathing rate appears normal, no obvious gross SOB, gasping or wheezing  CV: no obvious cyanosis   PSYCH/NEURO: pleasant and cooperative, no obvious depression or anxiety, speech and thought processing grossly intact   ASSESSMENT AND PLAN:  Discussed the following assessment and plan:    ICD-10-CM   1. Reaction to hymenoptera sting  T63.481A     2. Medication management  Z79.899       Counseled.  This may not be anaphylactic but agree should have EpiPen describes symptoms of such should  also have available liquid Benadryl or liquid caps to use for immediate use if gets stung. Discussed option for allergy referral and testing evaluation question need for desensitization.  This could have been a large local reaction versus true anaphylaxis. Can wait and observe treat if gets a serious reaction again would do allergy referral or as needed EpiPen rx  sent  to local pharmacy. Expectant management and discussion of plan and treatment with opportunity to ask questions and all were answered. The patient agreed with the plan and demonstrated an understanding of the instructions.   Advised to call back or seek an in-person evaluation if worsening  or having  further concerns  in interim. Return for when planned, as indicated.    Shanon Ace, MD

## 2021-12-03 ENCOUNTER — Telehealth: Payer: Self-pay | Admitting: Internal Medicine

## 2021-12-03 NOTE — Telephone Encounter (Signed)
You can get meclizine 25 mg over-the-counter does not need to be a prescription. However can send in 1 refill for the meclizine 25 mg that is on her med list when she uses this for travel.

## 2021-12-03 NOTE — Telephone Encounter (Signed)
Pt called to request a refill of the  meclizine (ANTIVERT) 25 MG tablet She is going on vacation and stated she needs this for her trip Please send to  [facility] [place] - [address], [address] AT [address] Phone:  [phone]  Fax:  [phone]

## 2021-12-03 NOTE — Telephone Encounter (Signed)
Left voicemail for patient to call the office to schedule virtual visit to discuss refilling Rx

## 2021-12-03 NOTE — Telephone Encounter (Signed)
Please see Medication refill request above

## 2021-12-04 MED ORDER — MECLIZINE HCL 25 MG PO TABS: ORAL_TABLET | ORAL | 0 refills | Status: DC

## 2021-12-04 NOTE — Addendum Note (Signed)
Addended by: Geradine Girt D on: 12/04/2021 08:50 AM   Modules accepted: Orders

## 2021-12-04 NOTE — Telephone Encounter (Signed)
Rx sent to the pharmacy.

## 2022-01-09 DIAGNOSIS — Z803 Family history of malignant neoplasm of breast: Secondary | ICD-10-CM | POA: Diagnosis not present

## 2022-01-09 DIAGNOSIS — Z1231 Encounter for screening mammogram for malignant neoplasm of breast: Secondary | ICD-10-CM | POA: Diagnosis not present

## 2022-01-13 DIAGNOSIS — L308 Other specified dermatitis: Secondary | ICD-10-CM | POA: Diagnosis not present

## 2022-01-14 ENCOUNTER — Encounter: Payer: Self-pay | Admitting: Obstetrics & Gynecology

## 2022-02-16 ENCOUNTER — Other Ambulatory Visit: Payer: Self-pay | Admitting: Internal Medicine

## 2022-02-25 ENCOUNTER — Telehealth: Payer: Self-pay | Admitting: *Deleted

## 2022-02-25 ENCOUNTER — Encounter: Payer: Self-pay | Admitting: Internal Medicine

## 2022-02-25 ENCOUNTER — Ambulatory Visit: Payer: BC Managed Care – PPO | Admitting: Radiology

## 2022-02-25 VITALS — BP 126/88

## 2022-02-25 DIAGNOSIS — R109 Unspecified abdominal pain: Secondary | ICD-10-CM | POA: Diagnosis not present

## 2022-02-25 DIAGNOSIS — N644 Mastodynia: Secondary | ICD-10-CM | POA: Diagnosis not present

## 2022-02-25 LAB — URINALYSIS, COMPLETE W/RFL CULTURE
Bacteria, UA: NONE SEEN /HPF
Bilirubin Urine: NEGATIVE
Casts: NONE SEEN /LPF
Crystals: NONE SEEN /HPF
Glucose, UA: NEGATIVE
Hgb urine dipstick: NEGATIVE
Hyaline Cast: NONE SEEN /LPF
Ketones, ur: NEGATIVE
Leukocyte Esterase: NEGATIVE
Nitrites, Initial: NEGATIVE
Protein, ur: NEGATIVE
RBC / HPF: NONE SEEN /HPF (ref 0–2)
Specific Gravity, Urine: 1.025 (ref 1.001–1.035)
WBC, UA: NONE SEEN /HPF (ref 0–5)
Yeast: NONE SEEN /HPF
pH: 5.5 (ref 5.0–8.0)

## 2022-02-25 LAB — NO CULTURE INDICATED

## 2022-02-25 NOTE — Telephone Encounter (Signed)
Patient scheduled at [facility] on 03/06/22 @ 11:15 am. Patient informed. Order faxed.

## 2022-02-25 NOTE — Telephone Encounter (Signed)
Tyrosine is not related to thyroid hormone It is an amino acid.  Make sure taking thyroid med at   60 minutes before taking  any supplement food or vitamin because can interfere with absorption

## 2022-02-25 NOTE — Telephone Encounter (Signed)
-----   Message from Kerry Dory, NP sent at 02/25/2022  3:08 PM EDT ----- Regarding: Referral Please refer for left breast u/s and diagnotic mammogram for left breast pain.

## 2022-02-25 NOTE — Progress Notes (Signed)
   Laura Smith [birth year] [id number]   History:  61 y.o. G1P1 Complains of shooting pain left breast and numbness in left axilla x's 1 week. M 01/14/22, birads-1 Bilateral breast MRI 07/05/21. Also reports right flank pain x 4 days, concerned she could have a UTI. Denies any dysuria or urgency/frequency. Would like her urine checked.  Gynecologic History No LMP recorded. Patient is postmenopausal.     Obstetric History OB History  Gravida Para Term Preterm AB Living  '1 1 1     1  '$ SAB IAB Ectopic Multiple Live Births               # Outcome Date GA Lbr Len/2nd Weight Sex Delivery Anes PTL Lv  1 Term              The following portions of the patient's history were reviewed and updated as appropriate: allergies, current medications, past family history, past medical history, past social history, past surgical history, and problem list.  Review of Systems Pertinent items noted in HPI and remainder of comprehensive ROS otherwise negative.   Past medical history, past surgical history, family history and social history were all reviewed and documented in the EPIC chart.   Exam:  Vitals:   02/25/22 1456  BP: 126/88   There is no height or weight on file to calculate BMI.  General appearance:  Normal  Abdominal  Soft,nontender, without masses, guarding or rebound.  Liver/spleen:  No organomegaly noted  Hernia:  None appreciated  No flank pain or CVAT  Skin  Inspection:  Grossly normal Breasts: Examined lying and sitting.   Right: Without masses, retractions, nipple discharge or axillary adenopathy.   Left: Without masses, retractions, nipple discharge or axillary adenopathy. Tender at [place]    Patient informed chaperone available to be present for breast and pelvic exam. Patient has requested no chaperone to be present. Patient has been advised what will be completed during breast and pelvic exam.   Assessment/Plan:   1. Breast pain, left Schedule dx mammo and  u/s of left breast Vitamin E 400iu BID to reduce inflammation  2. Right flank pain  - Urinalysis,Complete w/RFL Culture  Will contact with results   Towanda Hornstein B WHNP-BC 3:08 PM 02/25/2022

## 2022-03-06 DIAGNOSIS — N644 Mastodynia: Secondary | ICD-10-CM | POA: Diagnosis not present

## 2022-03-09 ENCOUNTER — Encounter: Payer: Self-pay | Admitting: Obstetrics & Gynecology

## 2022-04-06 ENCOUNTER — Telehealth: Payer: Self-pay | Admitting: *Deleted

## 2022-04-06 NOTE — Telephone Encounter (Signed)
[employer] PA<  Key: [id number] , [id number].  Your information has been submitted to [place]. [employer] will review the request and notify you of the determination decision directly, typically within 72 hours of receiving all information. If [employer] [place] has not responded within the specified timeframe or if you have any questions about your PA submission, contact [place] [facility] directly at [phone].

## 2022-04-07 NOTE — Telephone Encounter (Signed)
Form completed, signed and faxed to [facility]. Received confirmation.

## 2022-04-07 NOTE — Telephone Encounter (Signed)
Received fax from [facility] re: additional clinical information. Form completed, placed on MD desk for signatures.

## 2022-04-08 NOTE — Telephone Encounter (Addendum)
[employer] Approved today Effective from 04/06/2022 through 04/06/2023. Approval faxed to pharmacy.

## 2022-04-10 ENCOUNTER — Telehealth: Payer: Self-pay | Admitting: Cardiology

## 2022-04-10 ENCOUNTER — Other Ambulatory Visit: Payer: Self-pay | Admitting: *Deleted

## 2022-04-10 DIAGNOSIS — R918 Other nonspecific abnormal finding of lung field: Secondary | ICD-10-CM

## 2022-04-10 NOTE — Progress Notes (Signed)
ct 

## 2022-04-10 NOTE — Telephone Encounter (Signed)
Patient asked why she needed a CT. Explained to patient that the CT was to monitor her pulmonary nodule. She is scheduled with [facility] for 05/22/22. Patient had not further questions or concerns.

## 2022-04-10 NOTE — Telephone Encounter (Signed)
Pt calling wanting to know why she needed a CT scheduled

## 2022-04-25 IMAGING — MR MR LUMBAR SPINE W/O CM
4 of 5 series · 18 of 48 positions shown · non-contrast
Comparison: Radiography 04/12/2020

CLINICAL DATA: Low back pain with bilateral hip pain. Right groin
and leg pain.

EXAM:
MRI LUMBAR SPINE WITHOUT CONTRAST
TECHNIQUE: Multiplanar, multisequence MR imaging of the lumbar spine was
performed. No intravenous contrast was administered.

[Series 6: T2 · sagittal · 4.0mm · 0.73mm/px · 6 of 15 slices shown (1 of 2)]
[im 1/15]
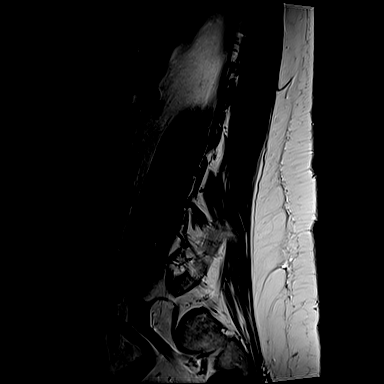
[im 3/15]
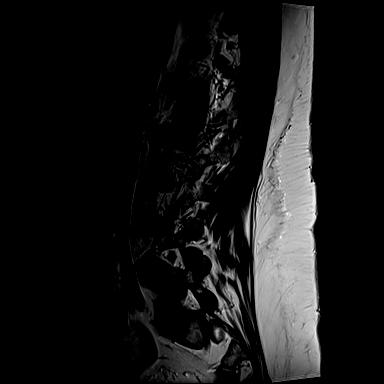
[im 6/15]
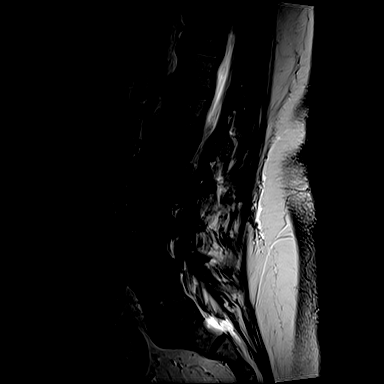
[im 9/15]
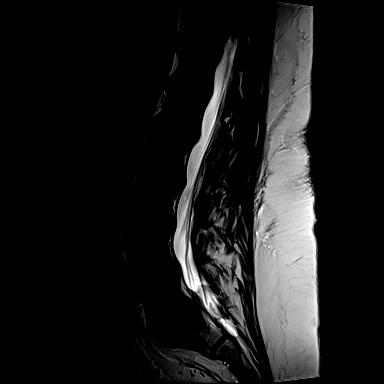
[im 12/15]
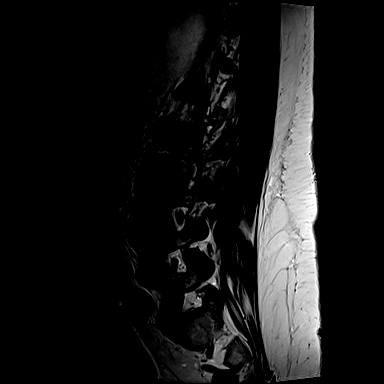
[im 15/15]
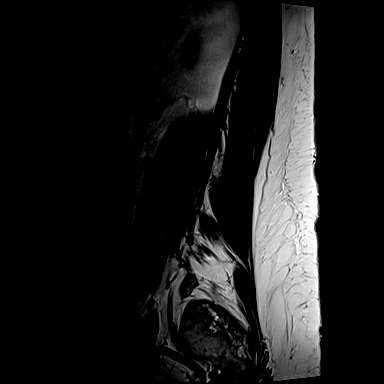

[Series 7: T1 · sagittal · 4.0mm · 0.73mm/px · 3 of 15 slices shown (1 of 2)]
[im 3/15]
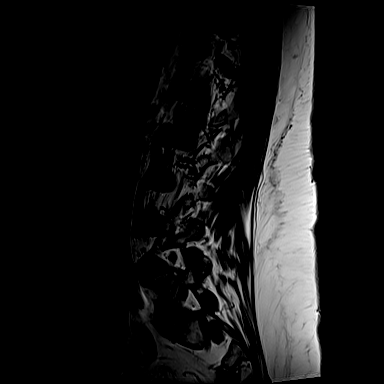
[im 9/15]
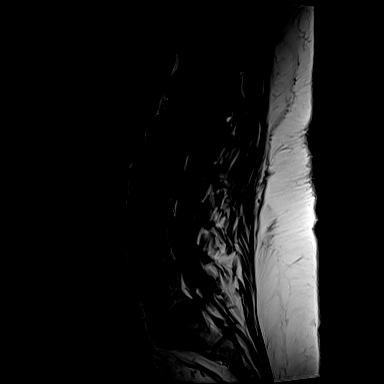
[im 15/15]
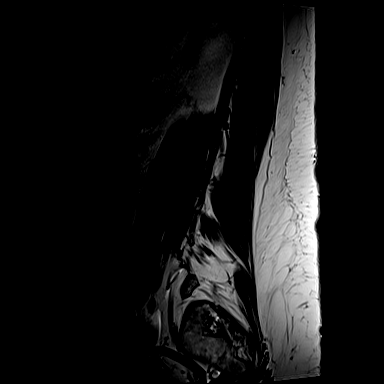

[Series 11: T1 · axial · 4.0mm · 0.28mm/px · z∈[-160,+6]mm · 3 of 41 slices shown (2 of 2)]
[im 6/41]
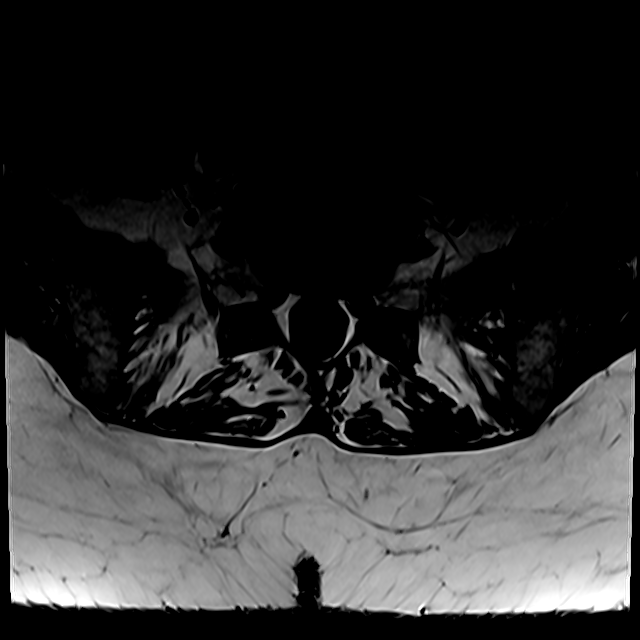
[im 21/41]
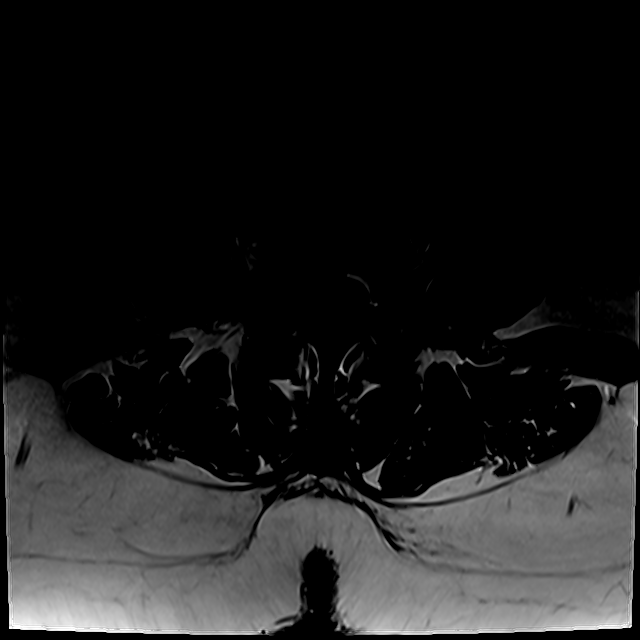
[im 35/41]
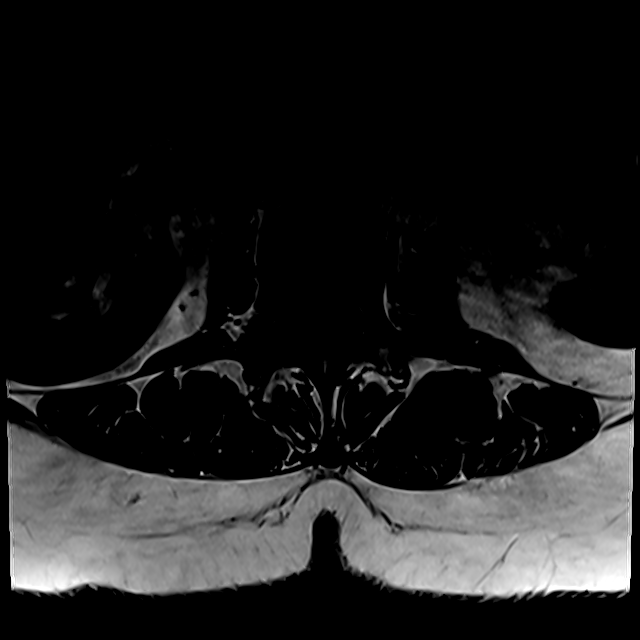

[Series 14: T2 · axial · 4.0mm · 0.28mm/px · z∈[-185,+6]mm · 6 of 41 slices shown (2 of 2)]
[im 1/41]
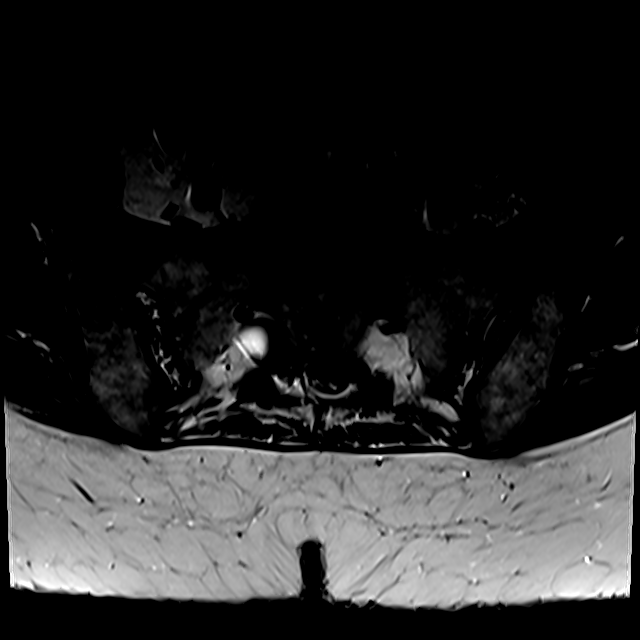
[im 6/41]
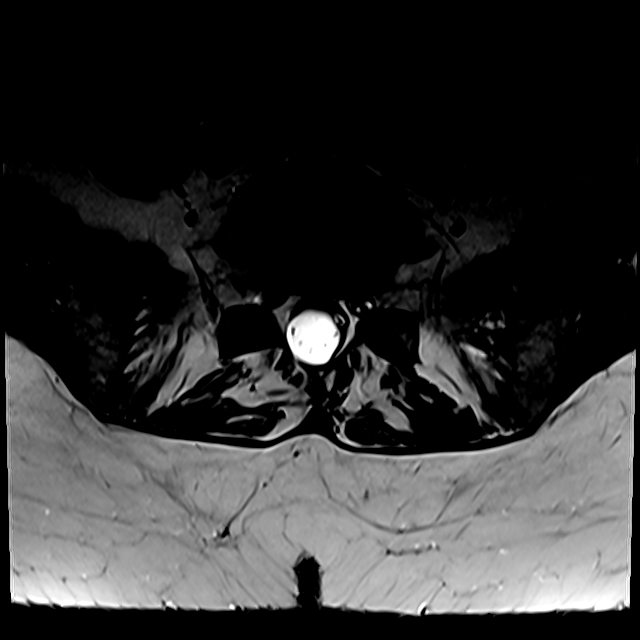
[im 12/41]
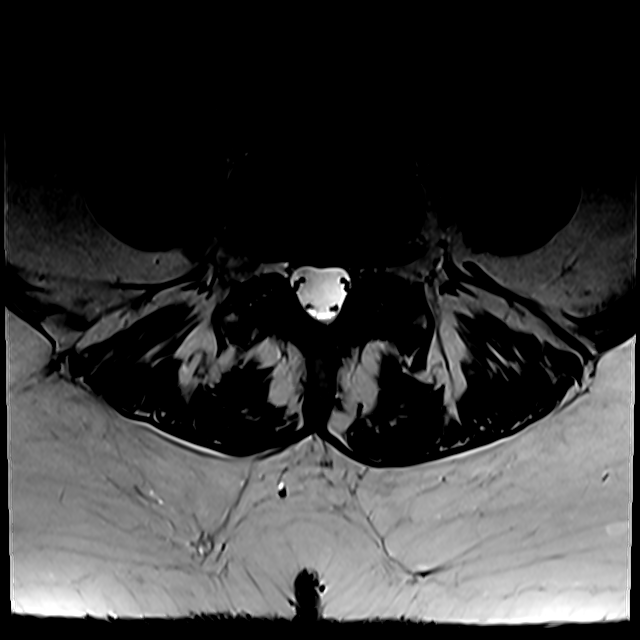
[im 18/41]
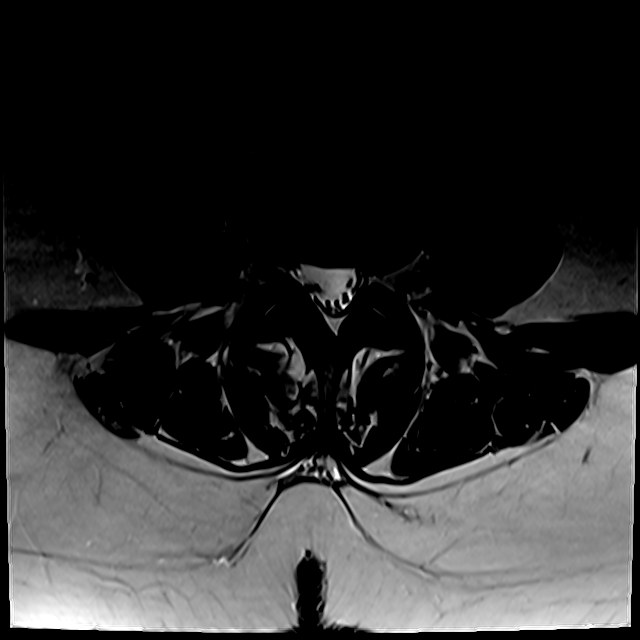
[im 21/41]
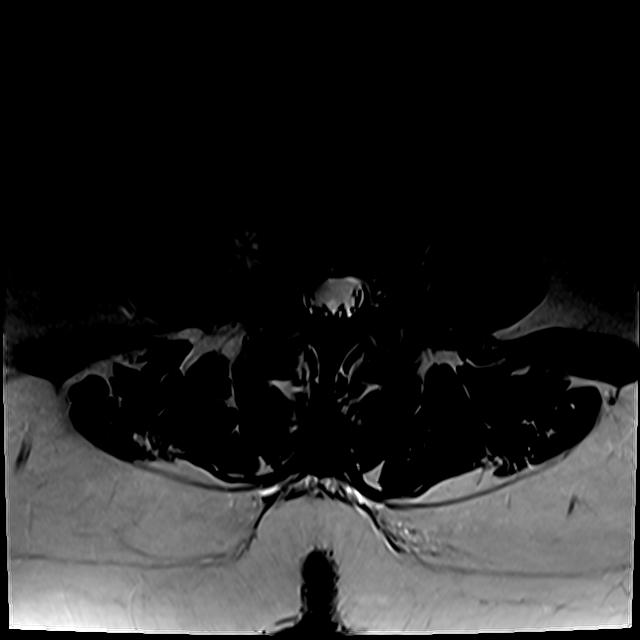
[im 35/41]
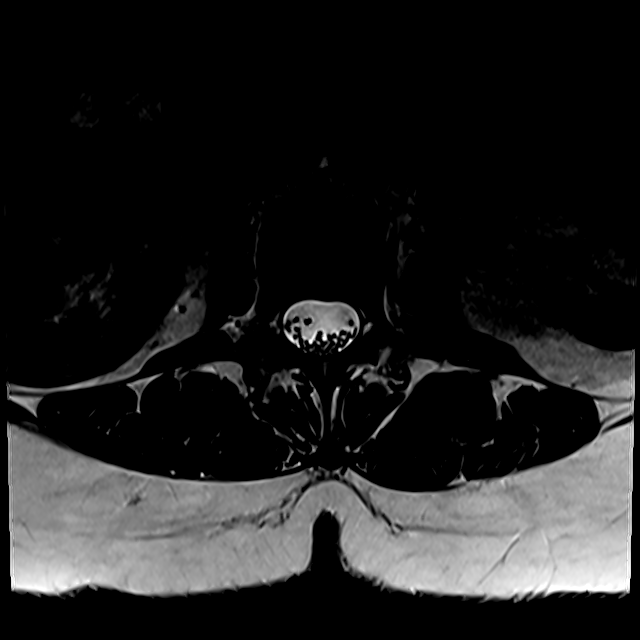

[18 of 48 positions shown; findings below may reference images not displayed]

FINDINGS: Segmentation: By chest and abdominal CT 07/22/2019 there are
hypoplastic ribs at T12. This gives 5 lumbar type vertebrae

Alignment:  Slight anterolisthesis at L4-5

Vertebrae:  No fracture, evidence of discitis, or bone lesion.

Conus medullaris and cauda equina: Conus extends to the T12-L1
level. Conus and cauda equina appear normal.

Paraspinal and other soft tissues: Sacral Tarlov cysts with mild
upper sacral bony scalloping.

Disc levels:

T12- L1: Unremarkable.

L1-L2: Disc narrowing and bulging.

L2-L3: Disc narrowing and bulging with T2 hyperintense right
paracentral extrusion which is inferiorly migrating and compresses
the right L3 nerve root.

L3-L4: Mild disc bulging.

L4-L5: Facet osteoarthritis with mild spurring and trace
anterolisthesis.

L5-S1:Unremarkable.
IMPRESSION: 1. L2-3 right paracentral extrusion with inferior migration to
nearly the L3-4 disc space. There is prominent right L3 impingement
at the subarticular recess.
2. No other neural compression.
3. Hypoplastic ribs at T12. The lowest open disc space is numbered
L5-S1.

## 2022-05-22 ENCOUNTER — Ambulatory Visit
Admission: RE | Admit: 2022-05-22 | Discharge: 2022-05-22 | Disposition: A | Discharge status: Home / Self Care | Payer: BC Managed Care – PPO | Source: Ambulatory Visit | Attending: Cardiology | Admitting: Cardiology

## 2022-05-22 DIAGNOSIS — R918 Other nonspecific abnormal finding of lung field: Secondary | ICD-10-CM

## 2022-05-22 DIAGNOSIS — R911 Solitary pulmonary nodule: Secondary | ICD-10-CM | POA: Diagnosis not present

## 2022-05-27 ENCOUNTER — Other Ambulatory Visit: Payer: Self-pay | Admitting: *Deleted

## 2022-05-27 ENCOUNTER — Encounter: Payer: Self-pay | Admitting: *Deleted

## 2022-05-27 DIAGNOSIS — R918 Other nonspecific abnormal finding of lung field: Secondary | ICD-10-CM

## 2022-06-03 ENCOUNTER — Ambulatory Visit: Payer: BC Managed Care – PPO | Admitting: Nurse Practitioner

## 2022-06-03 ENCOUNTER — Encounter: Payer: Self-pay | Admitting: Nurse Practitioner

## 2022-06-03 VITALS — BP 124/82 | HR 95

## 2022-06-03 DIAGNOSIS — R350 Frequency of micturition: Secondary | ICD-10-CM

## 2022-06-03 DIAGNOSIS — R1032 Left lower quadrant pain: Secondary | ICD-10-CM

## 2022-06-03 DIAGNOSIS — R300 Dysuria: Secondary | ICD-10-CM

## 2022-06-03 DIAGNOSIS — R3915 Urgency of urination: Secondary | ICD-10-CM | POA: Diagnosis not present

## 2022-06-03 DIAGNOSIS — R197 Diarrhea, unspecified: Secondary | ICD-10-CM

## 2022-06-03 LAB — WET PREP FOR TRICH, YEAST, CLUE

## 2022-06-03 NOTE — Progress Notes (Signed)
   Acute Office Visit  Subjective:    Patient ID: Laura Smith, female    DOB: 1960/06/29, 61 y.o.   MRN: [id number]   HPI 61 y.o. presents today for dysuria, frequency, urgency, LLQ pain, and malodorous urine x 1 day. She felt "blah" about a week before that. Denies vaginal symptoms. Did have episode of diarrhea when she got here but otherwise denies GI symptoms.    Review of Systems  Constitutional: Negative.  Negative for chills and fever.  Gastrointestinal:  Positive for abdominal pain (LLQ) and diarrhea. Negative for blood in stool, constipation, nausea and vomiting.  Genitourinary:  Positive for dysuria, frequency and urgency. Negative for difficulty urinating, flank pain, hematuria, vaginal discharge and vaginal pain.       Objective:    Physical Exam Constitutional:      Appearance: Normal appearance.  Abdominal:     Tenderness: There is abdominal tenderness in the left lower quadrant. There is no right CVA tenderness, left CVA tenderness, guarding or rebound.  Genitourinary:    General: Normal vulva.     Vagina: Normal.     Cervix: Normal.     Uterus: Normal.      Adnexa: Right adnexa normal and left adnexa normal.     BP 124/82   Pulse 95   SpO2 95%  Wt Readings from Last 3 Encounters:  07/04/21 218 lb 3 oz (99 kg)  07/04/21 217 lb (98.4 kg)  06/11/21 214 lb (97.1 kg)        Patient informed chaperone available to be present for breast and/or pelvic exam. Patient has requested no chaperone to be present. Patient has been advised what will be completed during breast and pelvic exam.   UA negative Wet prep negative  Assessment & Plan:   Problem List Items Addressed This Visit   None Visit Diagnoses     Burning with urination    -  Primary   Relevant Orders   Urinalysis,Complete w/RFL Culture (Completed)   Urine Culture   REFLEXIVE URINE CULTURE (Completed)   WET PREP FOR [place], YEAST, CLUE (Completed)   Left lower quadrant pain       Relevant  Orders   Urinalysis,Complete w/RFL Culture (Completed)   Urine Culture   REFLEXIVE URINE CULTURE (Completed)   Urinary frequency       Relevant Orders   Urinalysis,Complete w/RFL Culture (Completed)   Urine Culture   REFLEXIVE URINE CULTURE (Completed)   Diarrhea, unspecified type          Plan: Negative wet prep and UA. Tenderness to LLQ and episode of diarrhea at visit. Recommend monitoring as likely GI related. If continues she will follow up with PCP or GI.      Tamela Gammon DNP, 3:48 PM 06/03/2022

## 2022-06-05 LAB — URINALYSIS, COMPLETE W/RFL CULTURE
Bacteria, UA: NONE SEEN /HPF
Bilirubin Urine: NEGATIVE
Glucose, UA: NEGATIVE
Hgb urine dipstick: NEGATIVE
Hyaline Cast: NONE SEEN /LPF
Ketones, ur: NEGATIVE
Leukocyte Esterase: NEGATIVE
Nitrites, Initial: NEGATIVE
Protein, ur: NEGATIVE
Specific Gravity, Urine: 1.025 (ref 1.001–1.035)
WBC, UA: NONE SEEN /HPF (ref 0–5)
pH: 6 (ref 5.0–8.0)

## 2022-06-05 LAB — URINE CULTURE
MICRO NUMBER:: 14309792
Result:: NO GROWTH
SPECIMEN QUALITY:: ADEQUATE

## 2022-06-05 LAB — CULTURE INDICATED

## 2022-06-08 ENCOUNTER — Ambulatory Visit: Payer: BC Managed Care – PPO | Admitting: Diagnostic Neuroimaging

## 2022-06-08 ENCOUNTER — Encounter: Payer: Self-pay | Admitting: Diagnostic Neuroimaging

## 2022-06-08 DIAGNOSIS — G43109 Migraine with aura, not intractable, without status migrainosus: Secondary | ICD-10-CM

## 2022-06-08 MED ORDER — NURTEC 75 MG PO TBDP: 1.0000 | ORAL_TABLET | ORAL | 12 refills | Status: DC | PRN

## 2022-06-08 MED ORDER — NURTEC 75 MG PO TBDP: 1.0000 | ORAL_TABLET | ORAL | 6 refills | Status: DC | PRN

## 2022-06-08 NOTE — Progress Notes (Signed)
[facility]  PATIENT: Laura Smith DOB: 03-Dec-1960  REFERRING CLINICIAN: Panosh, [name], MD HISTORY FROM: Patient REASON FOR VISIT: follow up   HISTORICAL  CHIEF COMPLAINT:  Chief Complaint  Patient presents with   Room 7    Pt is here Alone. Pt states that the weather and stress starts off her migraine. Pt states no blurry vision or nausea.     HISTORY OF PRESENT ILLNESS:   UPDATE (06/08/22, VRP): Since last visit, doing well on meds. Symptoms are stable. No alleviating or aggravating factors. Avg 1 HA per month. Planning to retire next year.   UPDATE (06/06/21, VRP): Since last visit, doing about the same. Memory issues mild and stable. Still working.   Here for migraine transfer of care --> started since 61 years old, right side headache, nausea, sens to lighy, hours, now about 1 per month. Triggers --> rain and menstrual cycle in past. Good results with nurtec, sulindac, ibuprofen, tylenol.   PRIOR HPI: 61 year old female here for evaluation memory loss.  Patient has been working at current company for more than 20 years.  She was using a particular type of software and job description for many years.  In May 2021 a transition to a new software and patient began training to learn the new process.  She tried from May until September 2021 but had quite difficult time learning the new routine.  She was surprised that she was unable to learn this.  She mentioned that she had some difficulty learning because she was being taught by 3 different people who contradicted she is other on instructions.  Previously she was considered a superuser of the old software system.  She did not get selected to be super user for the new system.  She is also having some problems with word finding difficulties, short-term memory loss, difficulty staying focused.  No major changes in ADLs.  Patient has been out of work since November 2021 due to back pain issues as  well.    REVIEW OF SYSTEMS: Full 14 system review of systems performed and negative with exception of: as per HPI.  ALLERGIES: Allergies  Allergen Reactions   Bee Venom Anaphylaxis    Very Large local reaction, leg and sob  seen in [place] ago    Etodolac Hives    Possible anaphylaxis  With vomiting and throat feeling tight  ED visit 8 14    Latex Hives, Rash and Other (See Comments)    Causes blisters   Other Hives, Rash and Other (See Comments)    Gel used for ultrasound-causes blisters also    Adhesive [Tape] Itching and Rash    HOME MEDICATIONS: Outpatient Medications Prior to Visit  Medication Sig Dispense Refill   Ascorbic Acid (VITAMIN C) 500 MG CAPS Take 1,000 mg by mouth every morning.      b complex vitamins tablet Take 1 tablet by mouth daily.     CALCIUM-MAGNESIUM PO Take 3 tablets by mouth daily.      cetirizine (ZYRTEC) 10 MG tablet Take 10 mg by mouth daily.     clobetasol ointment (TEMOVATE) 0.05 % SMARTSIG:1 sparingly Topical Twice Daily     cyclobenzaprine (FLEXERIL) 10 MG tablet Take 10 mg by mouth as needed for muscle spasms.      EPINEPHrine 0.3 mg/0.3 mL IJ SOAJ injection Inject 0.3 mg into the muscle as needed for anaphylaxis. 1 each 1   hydrochlorothiazide (HYDRODIURIL) 25 MG tablet TAKE 1 TABLET  BY MOUTH DAILY 90 tablet 3   levothyroxine (SYNTHROID) 50 MCG tablet Take 1 tablet (50 mcg total) by mouth daily. 90 tablet 2   meclizine (ANTIVERT) 25 MG tablet TAKE 2 TABLET (50 MG TOTAL) BY MOUTH 2 (TWO) TIMES DAILY AS NEEDED (FOR TRAVEL). 30 tablet 0   Misc Natural Products (RED WINE EXTRACT PO) Take by mouth.     Multiple Vitamins-Iron (MULTIVITAMIN/IRON) TABS Take 1 tablet by mouth daily.     nystatin powder Apply topically 2 (two) times daily. As needed for rash 30 g 5   sulindac (CLINORIL) 200 MG tablet Take 1 tablet (200 mg total) by mouth 2 (two) times daily as needed (headache). Reported on 09/23/2015 30 tablet 3   SUMAtriptan (IMITREX) 6  MG/0.5ML SOLN injection Inject 6 mg into the skin every 2 (two) hours as needed for migraine. Reported on 09/23/2015     traMADol (ULTRAM) 50 MG tablet Take 50 mg by mouth as needed for severe pain.      verapamil (CALAN-SR) 180 MG CR tablet TAKE 1 TABLET BY MOUTH AT BEDTIME 90 tablet 3   Rimegepant Sulfate (NURTEC) 75 MG TBDP Take 1 tablet by mouth as needed (for migraine). 8 tablet 6   Facility-Administered Medications Prior to Visit  Medication Dose Route Frequency Provider Last Rate Last Admin   methylPREDNISolone acetate (DEPO-MEDROL) injection 120 mg  120 mg Intramuscular Once Panosh, [name], MD        PAST MEDICAL HISTORY: Past Medical History:  Diagnosis Date   Allergy    Breast cyst    Endometriosis    surgery laser 2001   Family history of breast cancer    Family history of colon cancer    Family history of prostate cancer    Hemorrhoids    Hx of basal cell carcinoma    Hypertension    Migraine    Thyroid disease    Urinary tract infection     PAST SURGICAL HISTORY: Past Surgical History:  Procedure Laterality Date   APPENDECTOMY  1992   AT C/S   BACK SURGERY  06/06/2020   L3-4   CESAREAN SECTION  1992   APPENDECTOMY SAME TIME   COLONOSCOPY  2015   HYSTEROSCOPY  08/1999   AT TIME OF LAPAROSCOPY   PELVIC LAPAROSCOPY  08/1999   AND HYSTEROSCOPY--ENDOMETRIOSIS   POLYPECTOMY      FAMILY HISTORY: Family History  Problem Relation Age of Onset   Breast cancer Mother        Age 79   Colon cancer Mother 78   Breast cancer Cousin 11       paternal first cousin   Heart disease Father 7       Heart attack   Hypertension Paternal Grandmother    Heart disease Paternal Grandmother    Stroke Paternal Grandmother    Cancer Paternal Grandmother        Lung   Cancer Paternal Grandfather        Prostate   Stroke Maternal Grandfather    COPD Maternal Uncle    Liver cancer Paternal Uncle    Congestive Heart Failure Maternal Grandmother    Hypertension Paternal  Aunt    Colon polyps Neg Hx    Stomach cancer Neg Hx    Rectal cancer Neg Hx    Esophageal cancer Neg Hx     SOCIAL HISTORY: Social History   Socioeconomic History   Marital status: Married    Spouse name: [name]   Number  of children: 1   Years of education: Not on file   Highest education level: Associate degree: occupational, technical, or vocational program  Occupational History   Not on file  Tobacco Use   Smoking status: Never   Smokeless tobacco: Never  Vaping Use   Vaping Use: Never used  Substance and Sexual Activity   Alcohol use: No    Alcohol/week: 0.0 standard drinks of alcohol   Drug use: No   Sexual activity: Not Currently    Birth control/protection: Post-menopausal    Comment: Pt declined sexual hx questions  Other Topics Concern   Not on file  Social History Narrative   h h of 2 Pet cat   Daughter /  Married    g1 p1   Works at [employer] Strain: Not on [employer] Insecurity: Not on file  Transportation Needs: Not on file  Physical Activity: Not on file  Stress: Not on file  Social Connections: Not on file  Intimate Partner Violence: Not on file     PHYSICAL EXAM  GENERAL EXAM/CONSTITUTIONAL: Vitals:  Vitals:   06/08/22 1549  BP: 137/86  Pulse: 81  Weight: 220 lb 6.4 oz (100 kg)  Height: '5\' 8"'$  (1.727 m)    Body mass index is 33.51 kg/m. Wt Readings from Last 3 Encounters:  06/08/22 220 lb 6.4 oz (100 kg)  07/04/21 218 lb 3 oz (99 kg)  07/04/21 217 lb (98.4 kg)   Patient is in no distress; well developed, nourished and groomed; neck is supple  CARDIOVASCULAR: Examination of carotid arteries is normal; no carotid bruits Regular rate and rhythm, no murmurs Examination of peripheral vascular system by observation and palpation is normal  EYES: Ophthalmoscopic exam of optic discs and posterior segments is normal; no papilledema or hemorrhages No results  found.  MUSCULOSKELETAL: Gait, strength, tone, movements noted in Neurologic exam below  NEUROLOGIC: MENTAL STATUS:     09/24/2020    3:09 PM  MMSE - Mini Mental State Exam  Orientation to time 5  Orientation to Place 5  Registration 3  Attention/ Calculation 4  Recall 3  Language- name 2 objects 2  Language- repeat 1  Language- follow 3 step command 3  Language- read & follow direction 1  Write a sentence 1  Copy design 1  Total score 29   awake, alert, oriented to person, place and time recent and remote memory intact normal attention and concentration language fluent, comprehension intact, naming intact fund of knowledge appropriate  CRANIAL NERVE:  2nd - no papilledema on fundoscopic exam 2nd, 3rd, 4th, 6th - pupils equal and reactive to light, visual fields full to confrontation, extraocular muscles intact, no nystagmus 5th - facial sensation symmetric 7th - facial strength symmetric 8th - hearing intact 9th - palate elevates symmetrically, uvula midline 11th - shoulder shrug symmetric 12th - tongue protrusion midline  MOTOR:  normal bulk and tone, full strength in the BUE, BLE  SENSORY:  normal and symmetric to light touch, temperature, vibration  COORDINATION:  finger-nose-finger, fine finger movements normal  REFLEXES:  deep tendon reflexes present and symmetric  GAIT/STATION:  narrow based gait     DIAGNOSTIC DATA (LABS, IMAGING, TESTING) - I reviewed patient records, labs, notes, testing and imaging myself where available.  Lab Results  Component Value Date   WBC 11.1 (H) 07/04/2021   HGB 14.2 07/04/2021   HCT 42.7 07/04/2021   MCV 87.1 07/04/2021  PLT 345 07/04/2021      Component Value Date/Time   NA 139 07/04/2021 1443   K 3.8 07/04/2021 1443   CL 102 07/04/2021 1443   CO2 30 07/04/2021 1443   GLUCOSE 104 (H) 07/04/2021 1443   BUN 18 07/04/2021 1443   CREATININE 0.93 07/04/2021 1443   CREATININE 0.94 05/24/2020 0914   CALCIUM  9.5 07/04/2021 1443   PROT 7.2 07/04/2021 1443   ALBUMIN 4.2 07/04/2021 1443   AST 20 07/04/2021 1443   ALT 17 07/04/2021 1443   ALKPHOS 67 07/04/2021 1443   BILITOT 0.4 07/04/2021 1443   GFRNONAA >60 07/04/2021 1443   GFRNONAA 66 05/24/2020 0914   GFRAA 76 05/24/2020 0914   Lab Results  Component Value Date   CHOL 196 06/11/2021   HDL 60.70 06/11/2021   LDLCALC 112 (H) 06/11/2021   LDLDIRECT 133.8 04/07/2013   TRIG 114.0 06/11/2021   CHOLHDL 3 06/11/2021   Lab Results  Component Value Date   HGBA1C 6.0 06/11/2021   Lab Results  Component Value Date   VITAMINB12 562 09/24/2020   Lab Results  Component Value Date   TSH 1.92 06/11/2021    10/02/20 Normal MRI brain    ASSESSMENT AND PLAN  61 y.o. year old female here with mild subjective memory loss issues since 2021 with no changes in ADLs.  Could represent mild cognitive impairment or other secondary cause.     Dx:  1. Migraine with aura and without status migrainosus, not intractable       PLAN:  MIGRAINE PREVENTION  LIFESTYLE CHANGES -Stop or avoid smoking -Decrease or avoid caffeine / alcohol -Eat and sleep on a regular schedule -Exercise several times per week  MIGRAINE RESCUE (1 per month) - ibuprofen, tylenol as needed - continue rimegepant (Nurtec) '75mg'$  as needed for breakthrough headache; max 8 per month  MILD MEMORY LOSS (MCI) - daily physical activity / exercise (at least 15-30 minutes) - eat more plants / vegetables - increase social activities, brain stimulation, games, puzzles, hobbies, crafts, arts, music - aim for at least 7-8 hours sleep per night (or more) - avoid smoking and alcohol - caregiver resources provided - caution with medications, finances, driving  personality / behavior (OCD tendency, perfectionism, ADD tendencies) - monitor for now  Meds ordered this encounter  Medications   DISCONTD: Rimegepant Sulfate (NURTEC) 75 MG TBDP    Sig: Take 1 tablet by mouth as  needed (for migraine).    Dispense:  8 tablet    Refill:  6   Rimegepant Sulfate (NURTEC) 75 MG TBDP    Sig: Take 1 tablet by mouth as needed (for migraine).    Dispense:  8 tablet    Refill:  12   Return in about 14 months (around 08/10/2023) for MyChart visit (15 min).    Penni Bombard, MD 97/58/8325, 4:98 PM Certified in Neurology, Neurophysiology and Neuroimaging  [facility] [address], [place], [place] [address] [phone]

## 2022-06-11 ENCOUNTER — Other Ambulatory Visit: Payer: Self-pay | Admitting: Internal Medicine

## 2022-06-11 DIAGNOSIS — E039 Hypothyroidism, unspecified: Secondary | ICD-10-CM

## 2022-06-12 ENCOUNTER — Ambulatory Visit (INDEPENDENT_AMBULATORY_CARE_PROVIDER_SITE_OTHER): Payer: BC Managed Care – PPO | Admitting: Family

## 2022-06-12 VITALS — BP 124/82 | HR 74 | Temp 98.1°F | Ht 66.75 in | Wt 218.0 lb

## 2022-06-12 DIAGNOSIS — Z0000 Encounter for general adult medical examination without abnormal findings: Secondary | ICD-10-CM

## 2022-06-12 DIAGNOSIS — E039 Hypothyroidism, unspecified: Secondary | ICD-10-CM | POA: Diagnosis not present

## 2022-06-12 DIAGNOSIS — I10 Essential (primary) hypertension: Secondary | ICD-10-CM

## 2022-06-12 DIAGNOSIS — R911 Solitary pulmonary nodule: Secondary | ICD-10-CM | POA: Diagnosis not present

## 2022-06-12 LAB — LIPID PANEL
Cholesterol: 196 mg/dL (ref 0–200)
HDL: 57.3 mg/dL (ref >39.00)
LDL Cholesterol: 115 mg/dL — ABNORMAL HIGH (ref 0–99)
NonHDL: 139.04
Total CHOL/HDL Ratio: 3
Triglycerides: 120 mg/dL (ref 0.0–149.0)
VLDL: 24 mg/dL (ref 0.0–40.0)

## 2022-06-12 LAB — COMPREHENSIVE METABOLIC PANEL
ALT: 19 U/L (ref 0–35)
AST: 23 U/L (ref 0–37)
Albumin: 4.2 g/dL (ref 3.5–5.2)
Alkaline Phosphatase: 59 U/L (ref 39–117)
BUN: 18 mg/dL (ref 6–23)
CO2: 30 mEq/L (ref 19–32)
Calcium: 9.2 mg/dL (ref 8.4–10.5)
Chloride: 103 mEq/L (ref 96–112)
Creatinine, Ser: 0.88 mg/dL (ref 0.40–1.20)
GFR: 70.46 mL/min (ref >60.00)
Glucose, Bld: 102 mg/dL — ABNORMAL HIGH (ref 70–99)
Potassium: 3.7 mEq/L (ref 3.5–5.1)
Sodium: 140 mEq/L (ref 135–145)
Total Bilirubin: 0.6 mg/dL (ref 0.2–1.2)
Total Protein: 7.1 g/dL (ref 6.0–8.3)

## 2022-06-12 LAB — CBC WITH DIFFERENTIAL/PLATELET
Basophils Absolute: 0.1 10*3/uL (ref 0.0–0.1)
Basophils Relative: 0.9 % (ref 0.0–3.0)
Eosinophils Absolute: 0.2 10*3/uL (ref 0.0–0.7)
Eosinophils Relative: 2.1 % (ref 0.0–5.0)
HCT: 44.6 % (ref 36.0–46.0)
Hemoglobin: 14.9 g/dL (ref 12.0–15.0)
Lymphocytes Relative: 34.5 % (ref 12.0–46.0)
Lymphs Abs: 2.6 10*3/uL (ref 0.7–4.0)
MCHC: 33.4 g/dL (ref 30.0–36.0)
MCV: 88 fl (ref 78.0–100.0)
Monocytes Absolute: 0.6 10*3/uL (ref 0.1–1.0)
Monocytes Relative: 7.4 % (ref 3.0–12.0)
Neutro Abs: 4.2 10*3/uL (ref 1.4–7.7)
Neutrophils Relative %: 55.1 % (ref 43.0–77.0)
Platelets: 349 10*3/uL (ref 150.0–400.0)
RBC: 5.07 Mil/uL (ref 3.87–5.11)
RDW: 13.7 % (ref 11.5–15.5)
WBC: 7.6 10*3/uL (ref 4.0–10.5)

## 2022-06-12 MED ORDER — SULINDAC 200 MG PO TABS: 200.0000 mg | ORAL_TABLET | Freq: Two times a day (BID) | ORAL | 5 refills | Status: AC | PRN

## 2022-06-12 NOTE — Patient Instructions (Signed)

## 2022-06-12 NOTE — Progress Notes (Unsigned)
Complete physical exam  Patient: Laura Smith   DOB: 04/14/1961   61 y.o. Female  MRN: [id number]  Subjective:    Chief Complaint  Patient presents with  . Annual Exam    Laura Smith is a 61 y.o. female who presents today for a complete physical exam. She reports consuming a general diet. The patient does not participate in regular exercise at present. She generally feels well. She reports sleeping fairly well. She does not have additional problems to discuss today.    Most recent fall risk assessment:    06/12/2022    8:40 AM  Fall Risk   Falls in the past year? 0  Number falls in past yr: 0  Injury with Fall? 0  Risk for fall due to : No Fall Risks  Follow up Falls evaluation completed     Most recent depression screenings:    06/12/2022    8:40 AM 06/11/2021   11:17 AM  PHQ 2/9 Scores  PHQ - 2 Score 0 0  PHQ- 9 Score 2 2    Vision:Within last year    Patient Care Team: Burnis Medin, MD as PCP - General (Internal Medicine) Jarome Matin, MD as Consulting Physician (Dermatology) Thalia Bloodgood, [place] as Referring Physician (Optometry) Tamela Gammon, NP as Nurse Practitioner (Gynecology)   Outpatient Medications Prior to Visit  Medication Sig  . Ascorbic Acid (VITAMIN C) 500 MG CAPS Take 1,000 mg by mouth every morning.   Marland Kitchen b complex vitamins tablet Take 1 tablet by mouth daily.  Marland Kitchen CALCIUM-MAGNESIUM PO Take 3 tablets by mouth daily.   . cetirizine (ZYRTEC) 10 MG tablet Take 10 mg by mouth daily.  . clobetasol ointment (TEMOVATE) 0.05 % SMARTSIG:1 sparingly Topical Twice Daily  . cyclobenzaprine (FLEXERIL) 10 MG tablet Take 10 mg by mouth as needed for muscle spasms.   Marland Kitchen EPINEPHrine 0.3 mg/0.3 mL IJ SOAJ injection Inject 0.3 mg into the muscle as needed for anaphylaxis.  . hydrochlorothiazide (HYDRODIURIL) 25 MG tablet TAKE 1 TABLET BY MOUTH DAILY  . levothyroxine (SYNTHROID) 50 MCG tablet Take 1 tablet (50 mcg total) by mouth daily.  . meclizine  (ANTIVERT) 25 MG tablet TAKE 2 TABLET (50 MG TOTAL) BY MOUTH 2 (TWO) TIMES DAILY AS NEEDED (FOR TRAVEL).  . Misc Natural Products (RED WINE EXTRACT PO) Take by mouth.  . Multiple Vitamins-Iron (MULTIVITAMIN/IRON) TABS Take 1 tablet by mouth daily.  Marland Kitchen nystatin powder Apply topically 2 (two) times daily. As needed for rash  . Rimegepant Sulfate (NURTEC) 75 MG TBDP Take 1 tablet by mouth as needed (for migraine).  . SUMAtriptan (IMITREX) 6 MG/0.5ML SOLN injection Inject 6 mg into the skin every 2 (two) hours as needed for migraine. Reported on 09/23/2015  . traMADol (ULTRAM) 50 MG tablet Take 50 mg by mouth as needed for severe pain.   . verapamil (CALAN-SR) 180 MG CR tablet TAKE 1 TABLET BY MOUTH AT BEDTIME  . [DISCONTINUED] sulindac (CLINORIL) 200 MG tablet Take 1 tablet (200 mg total) by mouth 2 (two) times daily as needed (headache). Reported on 09/23/2015   Facility-Administered Medications Prior to Visit  Medication Dose Route Frequency Provider  . methylPREDNISolone acetate (DEPO-MEDROL) injection 120 mg  120 mg Intramuscular Once Panosh, [name], MD    Review of Systems  All other systems reviewed and are negative.        Objective:     BP 124/82 (BP Location: Left Arm, Patient Position: Sitting, Cuff Size: Large)  Pulse 74   Temp 98.1 F (36.7 C) (Oral)   Ht 5' 6.75" (1.695 m)   Wt 218 lb (98.9 kg)   SpO2 96%   BMI 34.40 kg/m  BP Readings from Last 3 Encounters:  06/12/22 124/82  06/08/22 137/86  06/03/22 124/82      Physical Exam Vitals and nursing note reviewed.  Constitutional:      Appearance: Normal appearance.  HENT:     Head: Normocephalic and atraumatic.     Right Ear: Tympanic membrane, ear canal and external ear normal.     Left Ear: Tympanic membrane, ear canal and external ear normal.     Nose: Nose normal.     Mouth/Throat:     Mouth: Mucous membranes are moist.     Pharynx: Oropharynx is clear.  Eyes:     Extraocular Movements: Extraocular  movements intact.     Pupils: Pupils are equal, round, and reactive to light.  Cardiovascular:     Rate and Rhythm: Normal rate and regular rhythm.  Pulmonary:     Effort: Pulmonary effort is normal.     Breath sounds: Normal breath sounds.  Abdominal:     General: Abdomen is flat.     Palpations: Abdomen is soft.  Musculoskeletal:        General: Normal range of motion.     Cervical back: Normal range of motion and neck supple.  Skin:    General: Skin is warm and dry.     Comments: Feet skin intact. Monofilament intact  Neurological:     General: No focal deficit present.     Mental Status: She is alert and oriented to person, place, and time.  Psychiatric:        Mood and Affect: Mood normal.        Behavior: Behavior normal.     No results found for any visits on 06/12/22. Last hemoglobin A1c Lab Results  Component Value Date   HGBA1C 6.0 06/11/2021        Assessment & Plan:    Routine Health Maintenance and Physical Exam  Immunization History  Administered Date(s) Administered  . Influenza Split 04/01/2012  . Influenza,inj,Quad PF,6+ Mos 04/11/2013, 04/13/2014, 04/29/2016, 04/06/2017, 05/17/2018, 05/22/2019, 02/11/2021  . Influenza-Unspecified 03/26/2020, 03/11/2022  . PFIZER(Purple Top)SARS-COV-2 Vaccination 10/20/2019, 11/17/2019  . Pension scheme manager 66yr & up 07/17/2020  . Td 04/29/2016  . Tdap 10/20/2005  . Zoster Recombinat (Shingrix) 05/24/2020, 08/02/2020    Health Maintenance  Topic Date Due  . Hepatitis C Screening  Never done  . COVID-19 Vaccine (4 - 2023-24 season) 02/20/2022  . MAMMOGRAM  01/15/2023  . PAP SMEAR-Modifier  07/04/2024  . COLONOSCOPY (Pts 45-464yrInsurance coverage will need to be confirmed)  08/14/2025  . DTaP/Tdap/Td (3 - Td or Tdap) 04/29/2026  . INFLUENZA VACCINE  Completed  . HIV Screening  Completed  . Zoster Vaccines- Shingrix  Completed  . HPV VACCINES  Aged Out    Discussed health benefits of  physical activity, and encouraged her to engage in regular exercise appropriate for her age and condition.  Problem List Items Addressed This Visit     Pulmonary nodule   Hypertension   Relevant Orders   Comprehensive metabolic panel   Lipid panel   Other Visit Diagnoses     Routine general medical examination at a health care facility    -  Primary   Relevant Orders   Comprehensive metabolic panel   CBC with Differential  Lipid panel   Hypothyroidism, unspecified type       Relevant Orders   TSH       Call the office with any questions or concerns. Recheck as scheduled and sooner as needed.   Return in 6 months (on 12/12/2022).     Kennyth Arnold, FNP

## 2022-06-13 LAB — TSH: TSH: 1.66 u[IU]/mL (ref 0.35–5.50)

## 2022-06-15 ENCOUNTER — Other Ambulatory Visit: Payer: Self-pay | Admitting: Diagnostic Neuroimaging

## 2022-06-15 DIAGNOSIS — G43109 Migraine with aura, not intractable, without status migrainosus: Secondary | ICD-10-CM

## 2022-06-16 ENCOUNTER — Encounter: Payer: BC Managed Care – PPO | Admitting: Internal Medicine

## 2022-06-26 ENCOUNTER — Encounter: Payer: Self-pay | Admitting: Pulmonary Disease

## 2022-06-26 ENCOUNTER — Ambulatory Visit: Payer: BC Managed Care – PPO | Admitting: Pulmonary Disease

## 2022-06-26 VITALS — BP 128/80 | HR 83 | Ht 66.75 in | Wt 221.6 lb

## 2022-06-26 DIAGNOSIS — R911 Solitary pulmonary nodule: Secondary | ICD-10-CM

## 2022-06-26 NOTE — Patient Instructions (Addendum)
We will schedule you for a follow up CT Chest in December this year  I will discuss your lung nodule with our advanced bronchoscopy team.   Follow up in December 2024 after CT Chest scan

## 2022-06-26 NOTE — Progress Notes (Signed)
Synopsis: Referred in January 2024 for abnormal chest imaging by Minus Breeding, MD  Subjective:   PATIENT ID: Laura Smith GENDER: female DOB: 04-24-1961, MRN: [id number]  HPI  Chief Complaint  Patient presents with   Consult    Referred by Dr. Percival Spanish for abnormal CT scan last month. States she has a family history of lung nodules.   Laura Smith is a 62 year old woman, never smoker with hypertension who is referred to pulmonary clinic for abnormal chest imaging.   She had CT Chest 05/22/22 for follow up of pulmonary nodules. She has a subsolid subpleural nodule measuring 1.2 x 1.1cm which has been stable since 2021. Other subcentimeter pulmonary nodules remain stable.   She has no respiratory symptoms.   She is a never smoker. Denies significant second hand smoke exposure. 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 No mold or water damage in her home.   Past Medical History:  Diagnosis Date   Allergy    Breast cyst    Endometriosis    surgery laser 2001   Family history of breast cancer    Family history of colon cancer    Family history of prostate cancer    Hemorrhoids    Hx of basal cell carcinoma    Hypertension    Migraine    Thyroid disease    Urinary tract infection      Family History  Problem Relation Age of Onset   Breast cancer Mother        Age 60   Colon cancer Mother 13   Breast cancer Cousin 76       paternal first cousin   Heart disease Father 71       Heart attack   Hypertension Paternal Grandmother    Heart disease Paternal Grandmother    Stroke Paternal Grandmother    Cancer Paternal Grandmother        Lung   Cancer Paternal Grandfather        Prostate   Stroke Maternal Grandfather    COPD Maternal Uncle    Liver cancer Paternal Uncle    Congestive Heart Failure Maternal Grandmother    Hypertension Paternal Aunt    Colon polyps Neg Hx    Stomach cancer Neg Hx    Rectal cancer Neg Hx    Esophageal cancer Neg Hx       Social History   Socioeconomic History   Marital status: Married    Spouse name: [name]   Number of children: 1   Years of education: Not on file   Highest education level: Associate degree: occupational, Hotel manager, or vocational program  Occupational History   Not on file  Tobacco Use   Smoking status: Never   Smokeless tobacco: Never  Vaping Use   Vaping Use: Never used  Substance and Sexual Activity   Alcohol use: No    Alcohol/week: 0.0 standard drinks of alcohol   Drug use: No   Sexual activity: Not Currently    Birth control/protection: Post-menopausal    Comment: Pt declined sexual hx questions  Other Topics Concern   Not on file  Social History Narrative   h h of 2 Pet cat   Daughter /  Married    g1 p1   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 Strain: Not on [employer] Insecurity: Not on file  Transportation Needs: Not on file  Physical Activity: Not on file  Stress: Not on file  Social Connections: Not on file  Intimate Partner Violence: Not on file     Allergies  Allergen Reactions   Bee Venom Anaphylaxis    Very Large local reaction, leg and sob  seen in [place] ago    Etodolac Hives    Possible anaphylaxis  With vomiting and throat feeling tight  ED visit 8 14    Latex Hives, Rash and Other (See Comments)    Causes blisters   Other Hives, Rash and Other (See Comments)    Gel used for ultrasound-causes blisters also    Adhesive [Tape] Itching and Rash     Outpatient Medications Prior to Visit  Medication Sig Dispense Refill   Ascorbic Acid (VITAMIN C) 500 MG CAPS Take 1,000 mg by mouth every morning.      b complex vitamins tablet Take 1 tablet by mouth daily.     CALCIUM-MAGNESIUM PO Take 3 tablets by mouth daily.      cetirizine (ZYRTEC) 10 MG tablet Take 10 mg by mouth daily.     clobetasol ointment (TEMOVATE) 0.05 % SMARTSIG:1 sparingly Topical Twice Daily     cyclobenzaprine (FLEXERIL) 10  MG tablet Take 10 mg by mouth as needed for muscle spasms.      EPINEPHrine 0.3 mg/0.3 mL IJ SOAJ injection Inject 0.3 mg into the muscle as needed for anaphylaxis. 1 each 1   hydrochlorothiazide (HYDRODIURIL) 25 MG tablet TAKE 1 TABLET BY MOUTH DAILY 90 tablet 3   levothyroxine (SYNTHROID) 50 MCG tablet Take 1 tablet (50 mcg total) by mouth daily. 90 tablet 0   meclizine (ANTIVERT) 25 MG tablet TAKE 2 TABLET (50 MG TOTAL) BY MOUTH 2 (TWO) TIMES DAILY AS NEEDED (FOR TRAVEL). 30 tablet 0   Misc Natural Products (RED WINE EXTRACT PO) Take by mouth.     Multiple Vitamins-Iron (MULTIVITAMIN/IRON) TABS Take 1 tablet by mouth daily.     nystatin powder Apply topically 2 (two) times daily. As needed for rash 30 g 5   Rimegepant Sulfate (NURTEC) 75 MG TBDP Take 1 tablet by mouth as needed (for migraine). 8 tablet 12   sulindac (CLINORIL) 200 MG tablet Take 1 tablet (200 mg total) by mouth 2 (two) times daily as needed (headache). Reported on 09/23/2015 30 tablet 5   SUMAtriptan (IMITREX) 6 MG/0.5ML SOLN injection Inject 6 mg into the skin every 2 (two) hours as needed for migraine. Reported on 09/23/2015     traMADol (ULTRAM) 50 MG tablet Take 50 mg by mouth as needed for severe pain.      verapamil (CALAN-SR) 180 MG CR tablet TAKE 1 TABLET BY MOUTH AT BEDTIME 90 tablet 3   Facility-Administered Medications Prior to Visit  Medication Dose Route Frequency Provider Last Rate Last Admin   methylPREDNISolone acetate (DEPO-MEDROL) injection 120 mg  120 mg Intramuscular Once Panosh, [name], MD       Review of Systems  Constitutional:  Negative for chills, fever, malaise/fatigue and weight loss.  HENT:  Negative for congestion, sinus pain and sore throat.   Eyes: Negative.   Respiratory:  Negative for cough, hemoptysis, sputum production, shortness of breath and wheezing.   Cardiovascular:  Negative for chest pain, palpitations, orthopnea, claudication and leg swelling.  Gastrointestinal:  Negative for  abdominal pain, heartburn, nausea and vomiting.  Genitourinary: Negative.   Musculoskeletal:  Negative for joint pain and myalgias.  Skin:  Negative for rash.  Neurological:  Negative for weakness.  Endo/Heme/Allergies: Negative.  Psychiatric/Behavioral: Negative.     Objective:   Vitals:   06/26/22 1429  BP: 128/80  Pulse: 83  SpO2: 97%  Weight: 221 lb 9.6 oz (100.5 kg)  Height: 5' 6.75" (1.695 m)   Physical Exam Constitutional:      General: She is not in acute distress.    Appearance: She is not ill-appearing.  HENT:     Head: Normocephalic and atraumatic.  Eyes:     General: No scleral icterus.    Conjunctiva/sclera: Conjunctivae normal.     Pupils: Pupils are equal, round, and reactive to light.  Cardiovascular:     Rate and Rhythm: Normal rate and regular rhythm.     Pulses: Normal pulses.     Heart sounds: Normal heart sounds. No murmur heard. Pulmonary:     Effort: Pulmonary effort is normal.     Breath sounds: Normal breath sounds. No wheezing, rhonchi or rales.  Abdominal:     General: Bowel sounds are normal.     Palpations: Abdomen is soft.  Musculoskeletal:     Right lower leg: No edema.     Left lower leg: No edema.  Lymphadenopathy:     Cervical: No cervical adenopathy.  Skin:    General: Skin is warm and dry.  Neurological:     General: No focal deficit present.     Mental Status: She is alert.  Psychiatric:        Mood and Affect: Mood normal.        Behavior: Behavior normal.        Thought Content: Thought content normal.        Judgment: Judgment normal.    CBC    Component Value Date/Time   WBC 7.6 06/12/2022 0855   RBC 5.07 06/12/2022 0855   HGB 14.9 06/12/2022 0855   HGB 14.2 07/04/2021 1443   HCT 44.6 06/12/2022 0855   PLT 349.0 06/12/2022 0855   PLT 345 07/04/2021 1443   MCV 88.0 06/12/2022 0855   MCV 91.3 11/20/2012 1131   MCH 29.0 07/04/2021 1443   MCHC 33.4 06/12/2022 0855   RDW 13.7 06/12/2022 0855   LYMPHSABS 2.6  06/12/2022 0855   MONOABS 0.6 06/12/2022 0855   EOSABS 0.2 06/12/2022 0855   BASOSABS 0.1 06/12/2022 0855      Latest Ref Rng & Units 06/12/2022    8:55 AM 07/04/2021    2:43 PM 06/11/2021   11:34 AM  BMP  Glucose 70 - 99 mg/dL 102  104  85   BUN 6 - 23 mg/dL '18  18  17   '$ Creatinine 0.40 - 1.20 mg/dL 0.88  0.93  0.85   Sodium 135 - 145 mEq/L 140  139  137   Potassium 3.5 - 5.1 mEq/L 3.7  3.8  3.8   Chloride 96 - 112 mEq/L 103  102  99   CO2 19 - 32 mEq/L '30  30  31   '$ Calcium 8.4 - 10.5 mg/dL 9.2  9.5  9.5    Chest imaging: CT Chest 05/22/22 1. Subsolid subpleural nodule of the medial left pulmonary apex measures 1.2 x 1.1 cm. This lesion is stable or at most minimally enlarged over multiple prior examinations dating back to 2021, and remains morphologically and behaviorally suspicious for indolent adenocarcinoma. At minimum, recommend additional follow-up CT in 1 year. Given subsolid character, PET-CT characterization for metabolic activity may be technically difficult. 2. Additional small pulmonary nodules, stable and presumed benign. Attention on follow-up.  PFT:  No data to display          Labs:  Path:  Echo:  Heart Catheterization:  Assessment & Plan:   Left upper lobe pulmonary nodule  Discussion: Laura Smith is a 62 year old woman, never smoker with hypertension who is referred to pulmonary clinic for abnormal chest imaging.   She has 1.1x1.2cm LUL subsolid subpleural nodule that has slightly increased in size 1.0 x 0.9cm in 02/16/20.   We will plan for follow up CT Chest scan in 05/2023. I will discuss her chest imaging findings with our advance bronchoscopy team and whether biopsy is warranted sooner.   Follow up in December after CT Chest.  Freda Jackson, MD [facility] Office: [phone]   Current Outpatient Medications:    Ascorbic Acid (VITAMIN C) 500 MG CAPS, Take 1,000 mg by mouth every morning. , Disp: ,  Rfl:    b complex vitamins tablet, Take 1 tablet by mouth daily., Disp: , Rfl:    CALCIUM-MAGNESIUM PO, Take 3 tablets by mouth daily. , Disp: , Rfl:    cetirizine (ZYRTEC) 10 MG tablet, Take 10 mg by mouth daily., Disp: , Rfl:    clobetasol ointment (TEMOVATE) 0.05 %, SMARTSIG:1 sparingly Topical Twice Daily, Disp: , Rfl:    cyclobenzaprine (FLEXERIL) 10 MG tablet, Take 10 mg by mouth as needed for muscle spasms. , Disp: , Rfl:    EPINEPHrine 0.3 mg/0.3 mL IJ SOAJ injection, Inject 0.3 mg into the muscle as needed for anaphylaxis., Disp: 1 each, Rfl: 1   hydrochlorothiazide (HYDRODIURIL) 25 MG tablet, TAKE 1 TABLET BY MOUTH DAILY, Disp: 90 tablet, Rfl: 3   levothyroxine (SYNTHROID) 50 MCG tablet, Take 1 tablet (50 mcg total) by mouth daily., Disp: 90 tablet, Rfl: 0   meclizine (ANTIVERT) 25 MG tablet, TAKE 2 TABLET (50 MG TOTAL) BY MOUTH 2 (TWO) TIMES DAILY AS NEEDED (FOR TRAVEL)., Disp: 30 tablet, Rfl: 0   Misc Natural Products (RED WINE EXTRACT PO), Take by mouth., Disp: , Rfl:    Multiple Vitamins-Iron (MULTIVITAMIN/IRON) TABS, Take 1 tablet by mouth daily., Disp: , Rfl:    nystatin powder, Apply topically 2 (two) times daily. As needed for rash, Disp: 30 g, Rfl: 5   Rimegepant Sulfate (NURTEC) 75 MG TBDP, Take 1 tablet by mouth as needed (for migraine)., Disp: 8 tablet, Rfl: 12   sulindac (CLINORIL) 200 MG tablet, Take 1 tablet (200 mg total) by mouth 2 (two) times daily as needed (headache). Reported on 09/23/2015, Disp: 30 tablet, Rfl: 5   SUMAtriptan (IMITREX) 6 MG/0.5ML SOLN injection, Inject 6 mg into the skin every 2 (two) hours as needed for migraine. Reported on 09/23/2015, Disp: , Rfl:    traMADol (ULTRAM) 50 MG tablet, Take 50 mg by mouth as needed for severe pain. , Disp: , Rfl:    verapamil (CALAN-SR) 180 MG CR tablet, TAKE 1 TABLET BY MOUTH AT BEDTIME, Disp: 90 tablet, Rfl: 3  Current Facility-Administered Medications:    methylPREDNISolone acetate (DEPO-MEDROL) injection 120 mg,  120 mg, Intramuscular, Once, Panosh, [name], MD

## 2022-06-29 ENCOUNTER — Telehealth: Payer: Self-pay | Admitting: Internal Medicine

## 2022-06-29 DIAGNOSIS — U071 COVID-19: Secondary | ICD-10-CM | POA: Diagnosis not present

## 2022-06-29 DIAGNOSIS — R0981 Nasal congestion: Secondary | ICD-10-CM | POA: Diagnosis not present

## 2022-06-29 DIAGNOSIS — R6883 Chills (without fever): Secondary | ICD-10-CM | POA: Diagnosis not present

## 2022-06-29 NOTE — Telephone Encounter (Signed)
Result message was released to My Chart on 06/13/22.   Results were viewed by patient on 06/17/22.    Left detailed voicemail for patient to contact office with any questions concerning lab results.

## 2022-06-29 NOTE — Telephone Encounter (Signed)
Patient returning call from 06/12/22 for lab results.

## 2022-06-30 ENCOUNTER — Ambulatory Visit: Payer: BC Managed Care – PPO

## 2022-06-30 ENCOUNTER — Telehealth: Payer: BC Managed Care – PPO | Admitting: Family Medicine

## 2022-07-02 ENCOUNTER — Telehealth: Payer: Self-pay | Admitting: Pulmonary Disease

## 2022-07-02 NOTE — Telephone Encounter (Signed)
Called and spoke with pt who wanted to know if Dr. Erin Fulling was able to talk with provider to see if she needs to have a lung biopsy performed based off of her CT results.  Dr. Erin Fulling, please advise on this for pt.

## 2022-07-02 NOTE — Telephone Encounter (Signed)
Patient called to ask if she still needs to get a lung biopsy.  She stated that the doctor was going to let her know the first of the month.  CB# [phone]

## 2022-07-03 ENCOUNTER — Ambulatory Visit: Payer: BC Managed Care – PPO | Admitting: Hematology

## 2022-07-03 NOTE — Telephone Encounter (Signed)
I spoke with patient over the phone today about the feedback from Dr. Verlee Monte, Dr. Valeta Harms and Dr. Lamonte Sakai.  She would like to meet with one of them in person to discuss further procedure options and possible referral to [facility].   Please schedule her for a follow up appointment with Dr. Verlee Monte, Dr. Valeta Harms or Dr. Lamonte Sakai on 1/19 or the following week as she will be out of her covid quarantine window.  Thanks, Wille Glaser

## 2022-07-06 NOTE — Telephone Encounter (Signed)
Called and spoke with patient. I was able to get her scheduled to see Dr. Valeta Harms on 07/23/22 at 330pm as she wanted some time to recover from [place] as well as have her husband present for the visit.   Nothing further needed at time of call.

## 2022-07-07 ENCOUNTER — Emergency Department (HOSPITAL_BASED_OUTPATIENT_CLINIC_OR_DEPARTMENT_OTHER)
Admission: EM | Admit: 2022-07-07 | Discharge: 2022-07-07 | Disposition: A | Discharge status: Home / Self Care | Payer: BC Managed Care – PPO | Attending: Emergency Medicine | Admitting: Emergency Medicine

## 2022-07-07 ENCOUNTER — Other Ambulatory Visit: Payer: Self-pay

## 2022-07-07 ENCOUNTER — Telehealth: Payer: Self-pay | Admitting: Internal Medicine

## 2022-07-07 ENCOUNTER — Emergency Department (HOSPITAL_BASED_OUTPATIENT_CLINIC_OR_DEPARTMENT_OTHER): Payer: BC Managed Care – PPO | Admitting: Radiology

## 2022-07-07 ENCOUNTER — Encounter (HOSPITAL_BASED_OUTPATIENT_CLINIC_OR_DEPARTMENT_OTHER): Payer: Self-pay

## 2022-07-07 DIAGNOSIS — Z91040 Latex allergy status: Secondary | ICD-10-CM | POA: Diagnosis not present

## 2022-07-07 DIAGNOSIS — R0789 Other chest pain: Secondary | ICD-10-CM | POA: Diagnosis not present

## 2022-07-07 DIAGNOSIS — R062 Wheezing: Secondary | ICD-10-CM | POA: Diagnosis not present

## 2022-07-07 DIAGNOSIS — R110 Nausea: Secondary | ICD-10-CM | POA: Diagnosis not present

## 2022-07-07 DIAGNOSIS — R0602 Shortness of breath: Secondary | ICD-10-CM | POA: Diagnosis not present

## 2022-07-07 DIAGNOSIS — R6883 Chills (without fever): Secondary | ICD-10-CM | POA: Diagnosis not present

## 2022-07-07 DIAGNOSIS — R079 Chest pain, unspecified: Secondary | ICD-10-CM | POA: Diagnosis not present

## 2022-07-07 LAB — CBC
HCT: 45.8 % (ref 36.0–46.0)
Hemoglobin: 15.4 g/dL — ABNORMAL HIGH (ref 12.0–15.0)
MCH: 29.1 pg (ref 26.0–34.0)
MCHC: 33.6 g/dL (ref 30.0–36.0)
MCV: 86.6 fL (ref 80.0–100.0)
Platelets: 397 10*3/uL (ref 150–400)
RBC: 5.29 MIL/uL — ABNORMAL HIGH (ref 3.87–5.11)
RDW: 13 % (ref 11.5–15.5)
WBC: 9.6 10*3/uL (ref 4.0–10.5)
nRBC: 0 % (ref 0.0–0.2)

## 2022-07-07 LAB — D-DIMER, QUANTITATIVE: D-Dimer, Quant: <0.27 ug/mL-FEU (ref 0.00–0.50)

## 2022-07-07 LAB — BASIC METABOLIC PANEL
Anion gap: 11 (ref 5–15)
BUN: 16 mg/dL (ref 8–23)
CO2: 28 mmol/L (ref 22–32)
Calcium: 10 mg/dL (ref 8.9–10.3)
Chloride: 99 mmol/L (ref 98–111)
Creatinine, Ser: 0.87 mg/dL (ref 0.44–1.00)
GFR, Estimated: >60 mL/min (ref >60)
Glucose, Bld: 110 mg/dL — ABNORMAL HIGH (ref 70–99)
Potassium: 3.6 mmol/L (ref 3.5–5.1)
Sodium: 138 mmol/L (ref 135–145)

## 2022-07-07 LAB — TROPONIN I (HIGH SENSITIVITY)
Troponin I (High Sensitivity): 5 ng/L (ref <18)
Troponin I (High Sensitivity): 6 ng/L (ref <18)

## 2022-07-07 MED ORDER — ALBUTEROL SULFATE HFA 108 (90 BASE) MCG/ACT IN AERS
2.0000 | INHALATION_SPRAY | Freq: Once | RESPIRATORY_TRACT | Status: AC
  Administered 2022-07-07: 2 via RESPIRATORY_TRACT
  Filled 2022-07-07: qty 6.7

## 2022-07-07 NOTE — ED Triage Notes (Signed)
POV from home, sent from [facility] for new EKG changes  Pt dx with covid last Monday and took full course of paxlovid, pt c/o tight/squeezing chest pain that started yesterday, went to UC and EKG showed new LBBB. Pt sts that she's coughing up very small amounts of clear phlegm. NAD, A&O x 4.

## 2022-07-07 NOTE — ED Provider Notes (Signed)
[facility] Provider Note   CSN: [id number] Arrival date & time: 07/07/22  1845     History  Chief Complaint  Patient presents with   Shortness of Breath   Chest Pain    Laura Smith is a 62 y.o. female.  Patient is a 62 yo female presenting for sob. Pt admits to dx of Covid on 06/29/22 with symptoms of sinus pressure and nasal congestion. States she received and completed Paxlovid and has had improvement of symptoms however they are still mildly present. States she went back to work today when she developed heaviness in the chest and had difficulty catching her breath. She denies coughing.  Denies previous hx of PE or DVT. No blood thinner use.   The history is provided by the patient. No language interpreter was used.  Shortness of Breath Associated symptoms: chest pain   Associated symptoms: no abdominal pain, no cough, no ear pain, no fever, no rash, no sore throat and no vomiting   Chest Pain Associated symptoms: shortness of breath   Associated symptoms: no abdominal pain, no back pain, no cough, no fever, no palpitations and no vomiting        Home Medications Prior to Admission medications   Medication Sig Start Date End Date Taking? Authorizing Provider  Ascorbic Acid (VITAMIN C) 500 MG CAPS Take 1,000 mg by mouth every morning.     [provider]  b complex vitamins tablet Take 1 tablet by mouth daily.    [provider]  CALCIUM-MAGNESIUM PO Take 3 tablets by mouth daily.     [provider]  cetirizine (ZYRTEC) 10 MG tablet Take 10 mg by mouth daily.    [provider]  clobetasol ointment (TEMOVATE) 0.05 % SMARTSIG:1 sparingly Topical Twice Daily 01/13/22   [provider]  cyclobenzaprine (FLEXERIL) 10 MG tablet Take 10 mg by mouth as needed for muscle spasms.  01/25/17   [provider]  EPINEPHrine 0.3 mg/0.3 mL IJ SOAJ injection Inject 0.3 mg into the muscle as needed for  anaphylaxis. 11/11/21   Panosh, [name], MD  hydrochlorothiazide (HYDRODIURIL) 25 MG tablet TAKE 1 TABLET BY MOUTH DAILY 02/19/22   Panosh, [name], MD  levothyroxine (SYNTHROID) 50 MCG tablet Take 1 tablet (50 mcg total) by mouth daily. 06/11/22   Panosh, [name], MD  meclizine (ANTIVERT) 25 MG tablet TAKE 2 TABLET (50 MG TOTAL) BY MOUTH 2 (TWO) TIMES DAILY AS NEEDED (FOR TRAVEL). 12/04/21   Panosh, [name], MD  Misc Natural Products (RED WINE EXTRACT PO) Take by mouth.    [provider]  Multiple Vitamins-Iron (MULTIVITAMIN/IRON) TABS Take 1 tablet by mouth daily.    [provider]  nystatin powder Apply topically 2 (two) times daily. As needed for rash 07/04/21   Princess Bruins, MD  Rimegepant Sulfate (NURTEC) 75 MG TBDP Take 1 tablet by mouth as needed (for migraine). 06/08/22   Penumalli, Earlean Polka, MD  sulindac (CLINORIL) 200 MG tablet Take 1 tablet (200 mg total) by mouth 2 (two) times daily as needed (headache). Reported on 09/23/2015 06/12/22   Kennyth Arnold, FNP  SUMAtriptan (IMITREX) 6 MG/0.5ML SOLN injection Inject 6 mg into the skin every 2 (two) hours as needed for migraine. Reported on 09/23/2015    [provider]  traMADol (ULTRAM) 50 MG tablet Take 50 mg by mouth as needed for severe pain.  01/19/17   [provider]  verapamil (CALAN-SR) 180 MG CR tablet  TAKE 1 TABLET BY MOUTH AT BEDTIME 02/19/22   Panosh, [name], MD      Allergies    Bee venom, Etodolac, Latex, Other, and Adhesive [tape]    Review of Systems   Review of Systems  Constitutional:  Negative for chills and fever.  HENT:  Negative for ear pain and sore throat.   Eyes:  Negative for pain and visual disturbance.  Respiratory:  Positive for shortness of breath. Negative for cough.   Cardiovascular:  Positive for chest pain. Negative for palpitations.  Gastrointestinal:  Negative for abdominal pain and vomiting.  Genitourinary:  Negative for dysuria and hematuria.   Musculoskeletal:  Negative for arthralgias and back pain.  Skin:  Negative for color change and rash.  Neurological:  Negative for seizures and syncope.  All other systems reviewed and are negative.   Physical Exam Updated Vital Signs BP (!) 145/64   Pulse 83   Temp 98.1 F (36.7 C) (Oral)   Resp 17   Ht '5\' 7"'$  (1.702 m)   Wt 98 kg   SpO2 96%   BMI 33.83 kg/m  Physical Exam Vitals and nursing note reviewed.  Constitutional:      General: She is not in acute distress.    Appearance: She is well-developed.  HENT:     Head: Normocephalic and atraumatic.  Eyes:     Conjunctiva/sclera: Conjunctivae normal.  Cardiovascular:     Rate and Rhythm: Normal rate and regular rhythm.     Heart sounds: No murmur heard. Pulmonary:     Effort: Pulmonary effort is normal. No respiratory distress.     Breath sounds: Normal breath sounds.  Abdominal:     Palpations: Abdomen is soft.     Tenderness: There is no abdominal tenderness.  Musculoskeletal:        General: No swelling.     Cervical back: Neck supple.  Skin:    General: Skin is warm and dry.     Capillary Refill: Capillary refill takes less than 2 seconds.  Neurological:     Mental Status: She is alert.  Psychiatric:        Mood and Affect: Mood normal.     ED Results / Procedures / Treatments   Labs (all labs ordered are listed, but only abnormal results are displayed) Labs Reviewed  BASIC METABOLIC PANEL - Abnormal; Notable for the following components:      Result Value   Glucose, Bld 110 (*)    All other components within normal limits  CBC - Abnormal; Notable for the following components:   RBC 5.29 (*)    Hemoglobin 15.4 (*)    All other components within normal limits  D-DIMER, QUANTITATIVE  TROPONIN I (HIGH SENSITIVITY)  TROPONIN I (HIGH SENSITIVITY)    EKG EKG Interpretation  Date/Time:  Tuesday July 07 2022 19:05:26 EST Ventricular Rate:  86 PR Interval:  182 QRS Duration: 136 QT  Interval:  412 QTC Calculation: 493 R Axis:   1 Text Interpretation: Normal sinus rhythm Possible Left atrial enlargement Left bundle branch block Abnormal ECG When compared with ECG of 22-Jul-2019 21:05, PREVIOUS ECG IS PRESENT Confirmed by Campbell Stall ([id number]) on 02/14/36 9:48:08 PM  Radiology DG Chest 2 View  Result Date: 07/07/2022 CLINICAL DATA:  Chest pain and shortness of breath.  Wheezing. EXAM: CHEST - 2 VIEW COMPARISON:  05/22/2022 FINDINGS: The heart size and mediastinal contours are within normal limits. Both lungs are clear. The visualized skeletal structures are unremarkable. IMPRESSION: No  active cardiopulmonary disease. Electronically Signed   By: Lucienne Capers M.D.   On: 07/07/2022 19:51    Procedures Procedures    Medications Ordered in ED Medications  albuterol (VENTOLIN HFA) 108 (90 Base) MCG/ACT inhaler 2 puff (has no administration in time range)    ED Course/ Medical Decision Making/ A&P                             Medical Decision Making Amount and/or Complexity of Data Reviewed Labs: ordered. Radiology: ordered.  Risk Prescription drug management.   62 year old female presenting for chest tightness and shortness of breath approximately 7 days post COVID-19 diagnosis with original improvement of symptoms.  Patient is alert and oriented x 2, no acute distress, afebrile, similar to signs.  Patient has equal bilateral breath status with no adventitious lung sounds.  No wheezing.  No hypoxia.  Stable EKG with sinus rhythm with left bundle branch block as seen on previous EKG in 2021 and stable right.  ST segment elevation or depression.  Stable troponins.  Stable electrolytes.  Stable chest x-ray.  No pneumothorax.  No pneumonia.  No signs or symptoms of sepsis.  D-dimer negative.  Low submission pulmonary embolism.  No signs or symptoms of acute aortic dissection.  Patient symptoms of chest tightness likely secondary to postviral syndrome from COVID.   Albuterol inhaler given in the emergency department with improvement of symptoms.  Safer discharge home at this time.  Patient in no distress and overall condition improved here in the ED. Detailed discussions were had with the patient regarding current findings, and need for close f/u with PCP or on call doctor. The patient has been instructed to return immediately if the symptoms worsen in any way for re-evaluation. Patient verbalized understanding and is in agreement with current care plan. All questions answered prior to discharge.         Final Clinical Impression(s) / ED Diagnoses Final diagnoses:  Chest heaviness    Rx / DC Orders ED Discharge Orders     None         Lianne Cure, DO 88/91/69 [id number]

## 2022-07-07 NOTE — Telephone Encounter (Addendum)
Triage Nurse Estill Bamberg) advised Pt to call 911.  Pt is refusing to go to the ED.   Pt is experiencing the following:  Chest pain/pressure, wheezing, cold sweats and shortness of breath.

## 2022-07-08 NOTE — Telephone Encounter (Signed)
---  caller states she had covid last week Jan 7th. went to [facility] and took paxlovid. is not feeling much better. wheezing and chest pain. minimal cough  07/07/2022 1:47:53 PM Call EMS 911 [place], RN, Estill Bamberg  Comments User: Rozelle Logan, RN Date/Time Eilene Ghazi Time): 07/07/2022 1:47:18 PM says cold sweats currently  Pt went to ED on 07/07/22

## 2022-07-10 ENCOUNTER — Encounter: Payer: Self-pay | Admitting: Internal Medicine

## 2022-07-10 ENCOUNTER — Encounter: Payer: Self-pay | Admitting: Obstetrics & Gynecology

## 2022-07-10 ENCOUNTER — Ambulatory Visit (INDEPENDENT_AMBULATORY_CARE_PROVIDER_SITE_OTHER): Payer: BC Managed Care – PPO | Admitting: Obstetrics & Gynecology

## 2022-07-10 VITALS — BP 116/70 | HR 72 | Resp 16 | Ht 66.25 in | Wt 218.0 lb

## 2022-07-10 DIAGNOSIS — Z780 Asymptomatic menopausal state: Secondary | ICD-10-CM

## 2022-07-10 DIAGNOSIS — Z01419 Encounter for gynecological examination (general) (routine) without abnormal findings: Secondary | ICD-10-CM

## 2022-07-10 NOTE — Progress Notes (Signed)
RAYNETTA OSTERLOH 11-23-1960 [id number]   History:    62 y.o. G1P1L1 Married   RP:  Established patient presenting for annual gyn exam    HPI: Postmenopausal.  No significant hot flashes or night sweats.  No vaginal bleeding.  No pelvic pain. Pap smear Neg 06/2021.  No significant history of abnormal Pap smears. Tends to have yeast of skin under the breasts during the warmer months, using Nystatin powder.  Breasts normal. Mammogram bilateral screening 12/2021, Left Dx/US Benign 02/2022. Colonoscopy 07/2020.  BMI 34.92.  Health labs with Fam MD. Flu vaccine at work.  Past medical history,surgical history, family history and social history were all reviewed and documented in the EPIC chart.  Gynecologic History No LMP recorded. Patient is postmenopausal.  Obstetric History OB History  Gravida Para Term Preterm AB Living  '1 1 1     1  '$ SAB IAB Ectopic Multiple Live Births               # Outcome Date GA Lbr Len/2nd Weight Sex Delivery Anes PTL Lv  1 Term              ROS: A ROS was performed and pertinent positives and negatives are included in the history. GENERAL: No fevers or chills. HEENT: No change in vision, no earache, sore throat or sinus congestion. NECK: No pain or stiffness. CARDIOVASCULAR: No chest pain or pressure. No palpitations. PULMONARY: No shortness of breath, cough or wheeze. GASTROINTESTINAL: No abdominal pain, nausea, vomiting or diarrhea, melena or bright red blood per rectum. GENITOURINARY: No urinary frequency, urgency, hesitancy or dysuria. MUSCULOSKELETAL: No joint or muscle pain, no back pain, no recent trauma. DERMATOLOGIC: No rash, no itching, no lesions. ENDOCRINE: No polyuria, polydipsia, no heat or cold intolerance. No recent change in weight. HEMATOLOGICAL: No anemia or easy bruising or bleeding. NEUROLOGIC: No headache, seizures, numbness, tingling or weakness. PSYCHIATRIC: No depression, no loss of interest in normal activity or change in sleep pattern.      Exam:   BP 116/70   Pulse 72   Resp 16   Ht 5' 6.25" (1.683 m)   Wt 218 lb (98.9 kg)   BMI 34.92 kg/m   Body mass index is 34.92 kg/m.  General appearance : Well developed well nourished female. No acute distress HEENT: Eyes: no retinal hemorrhage or exudates,  Neck supple, trachea midline, no carotid bruits, no thyroidmegaly Lungs: Clear to auscultation, no rhonchi or wheezes, or rib retractions  Heart: Regular rate and rhythm, no murmurs or gallops Breast:Examined in sitting and supine position were symmetrical in appearance, no palpable masses or tenderness,  no skin retraction, no nipple inversion, no nipple discharge, no skin discoloration, no axillary or supraclavicular lymphadenopathy Abdomen: no palpable masses or tenderness, no rebound or guarding Extremities: no edema or skin discoloration or tenderness  Pelvic: Vulva: Normal             Vagina: No gross lesions or discharge  Cervix: No gross lesions or discharge  Uterus  AV, normal size, shape and consistency, non-tender and mobile  Adnexa  Without masses or tenderness  Anus: Normal   Assessment/Plan:  62 y.o. female for annual exam   1. Well female exam with routine gynecological exam Postmenopausal.  No significant hot flashes or night sweats. No vaginal bleeding.  No pelvic pain. Pap smear Neg 06/2021.  No significant history of abnormal Pap smears. Tends to have yeast of skin under the breasts during the warmer months, using Nystatin  powder.  Breasts normal. Mammogram bilateral screening 12/2021, Left Dx/US Benign 02/2022. Colonoscopy 07/2020.  BMI 34.92.  Health labs with Fam MD. Flu vaccine at work.  2. Postmenopause  Postmenopausal.  No significant hot flashes or night sweats. No vaginal bleeding.  No pelvic pain.   Princess Bruins MD, 2:26 PM

## 2022-07-21 DIAGNOSIS — L820 Inflamed seborrheic keratosis: Secondary | ICD-10-CM | POA: Diagnosis not present

## 2022-07-21 DIAGNOSIS — L308 Other specified dermatitis: Secondary | ICD-10-CM | POA: Diagnosis not present

## 2022-07-21 DIAGNOSIS — L821 Other seborrheic keratosis: Secondary | ICD-10-CM | POA: Diagnosis not present

## 2022-07-21 DIAGNOSIS — L812 Freckles: Secondary | ICD-10-CM | POA: Diagnosis not present

## 2022-07-21 DIAGNOSIS — D1801 Hemangioma of skin and subcutaneous tissue: Secondary | ICD-10-CM | POA: Diagnosis not present

## 2022-07-23 ENCOUNTER — Institutional Professional Consult (permissible substitution): Payer: BC Managed Care – PPO | Admitting: Pulmonary Disease

## 2022-07-23 ENCOUNTER — Ambulatory Visit (INDEPENDENT_AMBULATORY_CARE_PROVIDER_SITE_OTHER): Payer: BC Managed Care – PPO | Admitting: Pulmonary Disease

## 2022-07-23 ENCOUNTER — Encounter: Payer: Self-pay | Admitting: Pulmonary Disease

## 2022-07-23 VITALS — BP 130/90 | HR 80 | Ht 66.5 in | Wt 217.6 lb

## 2022-07-23 DIAGNOSIS — R918 Other nonspecific abnormal finding of lung field: Secondary | ICD-10-CM | POA: Diagnosis not present

## 2022-07-23 DIAGNOSIS — Z789 Other specified health status: Secondary | ICD-10-CM

## 2022-07-23 DIAGNOSIS — R911 Solitary pulmonary nodule: Secondary | ICD-10-CM | POA: Diagnosis not present

## 2022-07-23 NOTE — Progress Notes (Deleted)
Synopsis: Referred in February 2024 for pulmonary nodule by Regis Bill [name], MD  Subjective:   PATIENT ID: Laura Smith GENDER: female DOB: Sep 14, 1960, MRN: [id number]  No chief complaint on file.   This is a 62 year old female, past medical history of basal cell carcinoma, family history of prostate cancer colon cancer breast cancer.  Patient is a lifelong never smoker.Patient has a groundglass opacity within the left upper lobe on the medial based mediastinal pleura.  It is subsolid in nature measuring 1.2 x 1.1 cm.  It has been relatively stable and or minimally enlarged dating back since 2021.  Its behavior and slow growth is concern for a low-grade malignancy.  Patient was referred here to discuss next steps.  And consideration for bronchoscopy.  Patient is a lifelong non-smoker.  She did have a period of secondhand smoke with her first husband.    Past Medical History:  Diagnosis Date   Allergy    Breast cyst    Endometriosis    surgery laser 2001   Family history of breast cancer    Family history of colon cancer    Family history of prostate cancer    Hemorrhoids    Hx of basal cell carcinoma    Hypertension    Migraine    Thyroid disease    Urinary tract infection      Family History  Problem Relation Age of Onset   Breast cancer Mother        Age 54   Colon cancer Mother 22   Breast cancer Cousin 59       paternal first cousin   Heart disease Father 65       Heart attack   Hypertension Paternal Grandmother    Heart disease Paternal Grandmother    Stroke Paternal Grandmother    Cancer Paternal Grandmother        Lung   Cancer Paternal Grandfather        Prostate   Stroke Maternal Grandfather    COPD Maternal Uncle    Liver cancer Paternal Uncle    Congestive Heart Failure Maternal Grandmother    Hypertension Paternal Aunt    Colon polyps Neg Hx    Stomach cancer Neg Hx    Rectal cancer Neg Hx    Esophageal cancer Neg Hx      Past Surgical  History:  Procedure Laterality Date   APPENDECTOMY  1992   AT C/S   BACK SURGERY  06/06/2020   L3-4   CESAREAN SECTION  1992   APPENDECTOMY SAME TIME   COLONOSCOPY  2015   HYSTEROSCOPY  08/1999   AT TIME OF LAPAROSCOPY   PELVIC LAPAROSCOPY  08/1999   AND HYSTEROSCOPY--ENDOMETRIOSIS   POLYPECTOMY      Social History   Socioeconomic History   Marital status: Married    Spouse name: [name]   Number of children: 1   Years of education: Not on file   Highest education level: Associate degree: occupational, Hotel manager, or vocational program  Occupational History   Not on file  Tobacco Use   Smoking status: Never   Smokeless tobacco: Never  Vaping Use   Vaping Use: Never used  Substance and Sexual Activity   Alcohol use: No    Alcohol/week: 0.0 standard drinks of alcohol   Drug use: No   Sexual activity: Not Currently    Partners: Male    Birth control/protection: Post-menopausal, Abstinence  Other Topics Concern   Not on file  Social  History Narrative   h h of 2 Pet cat   Daughter /  Married    g1 p1   Works at [employer] Strain: Not on [employer] Insecurity: Not on file  Transportation Needs: Not on file  Physical Activity: Not on file  Stress: Not on file  Social Connections: Not on file  Intimate Partner Violence: Not on file     Allergies  Allergen Reactions   Bee Venom Anaphylaxis    Very Large local reaction, leg and sob  seen in ed [employer] years ago    Etodolac Hives    Possible anaphylaxis  With vomiting and throat feeling tight  ED visit 8 14    Latex Hives, Rash and Other (See Comments)    Causes blisters   Other Hives, Rash and Other (See Comments)    Gel used for ultrasound-causes blisters also    Adhesive [Tape] Itching and Rash     Outpatient Medications Prior to Visit  Medication Sig Dispense Refill   Ascorbic Acid (VITAMIN C) 500 MG CAPS Take 1,000 mg by mouth every morning.       b complex vitamins tablet Take 1 tablet by mouth daily.     CALCIUM-MAGNESIUM PO Take 3 tablets by mouth daily.      cetirizine (ZYRTEC) 10 MG tablet Take 10 mg by mouth daily.     clobetasol ointment (TEMOVATE) 0.05 % SMARTSIG:1 sparingly Topical Twice Daily     cyclobenzaprine (FLEXERIL) 10 MG tablet Take 10 mg by mouth as needed for muscle spasms.      EPINEPHrine 0.3 mg/0.3 mL IJ SOAJ injection Inject 0.3 mg into the muscle as needed for anaphylaxis. (Patient not taking: Reported on 07/10/2022) 1 each 1   hydrochlorothiazide (HYDRODIURIL) 25 MG tablet TAKE 1 TABLET BY MOUTH DAILY 90 tablet 3   levothyroxine (SYNTHROID) 50 MCG tablet Take 1 tablet (50 mcg total) by mouth daily. 90 tablet 0   meclizine (ANTIVERT) 25 MG tablet TAKE 2 TABLET (50 MG TOTAL) BY MOUTH 2 (TWO) TIMES DAILY AS NEEDED (FOR TRAVEL). 30 tablet 0   Misc Natural Products (RED WINE EXTRACT PO) Take by mouth.     Multiple Vitamins-Iron (MULTIVITAMIN/IRON) TABS Take 1 tablet by mouth daily.     nystatin powder Apply topically 2 (two) times daily. As needed for rash 30 g 5   Rimegepant Sulfate (NURTEC) 75 MG TBDP Take 1 tablet by mouth as needed (for migraine). 8 tablet 12   sulindac (CLINORIL) 200 MG tablet Take 1 tablet (200 mg total) by mouth 2 (two) times daily as needed (headache). Reported on 09/23/2015 30 tablet 5   SUMAtriptan (IMITREX) 6 MG/0.5ML SOLN injection Inject 6 mg into the skin every 2 (two) hours as needed for migraine. Reported on 09/23/2015     traMADol (ULTRAM) 50 MG tablet Take 50 mg by mouth as needed for severe pain.      verapamil (CALAN-SR) 180 MG CR tablet TAKE 1 TABLET BY MOUTH AT BEDTIME 90 tablet 3   Facility-Administered Medications Prior to Visit  Medication Dose Route Frequency Provider Last Rate Last Admin   methylPREDNISolone acetate (DEPO-MEDROL) injection 120 mg  120 mg Intramuscular Once Panosh, [name], MD        Review of Systems  Constitutional:  Negative for chills, fever,  malaise/fatigue and weight loss.  HENT:  Negative for hearing loss, sore throat and tinnitus.   Eyes:  Negative  for blurred vision and double vision.  Respiratory:  Negative for cough, hemoptysis, sputum production, shortness of breath, wheezing and stridor.   Cardiovascular:  Negative for chest pain, palpitations, orthopnea, leg swelling and PND.  Gastrointestinal:  Negative for abdominal pain, constipation, diarrhea, heartburn, nausea and vomiting.  Genitourinary:  Negative for dysuria, hematuria and urgency.  Musculoskeletal:  Negative for joint pain and myalgias.  Skin:  Negative for itching and rash.  Neurological:  Negative for dizziness, tingling, weakness and headaches.  Endo/Heme/Allergies:  Negative for environmental allergies. Does not bruise/bleed easily.  Psychiatric/Behavioral:  Negative for depression. The patient is not nervous/anxious and does not have insomnia.   All other systems reviewed and are negative.    Objective:  Physical Exam Vitals reviewed.  Constitutional:      General: She is not in acute distress.    Appearance: She is well-developed.  HENT:     Head: Normocephalic and atraumatic.  Eyes:     General: No scleral icterus.    Conjunctiva/sclera: Conjunctivae normal.     Pupils: Pupils are equal, round, and reactive to light.  Neck:     Vascular: No JVD.     Trachea: No tracheal deviation.  Cardiovascular:     Rate and Rhythm: Normal rate and regular rhythm.     Heart sounds: Normal heart sounds. No murmur heard. Pulmonary:     Effort: Pulmonary effort is normal. No tachypnea, accessory muscle usage or respiratory distress.     Breath sounds: No stridor. No wheezing, rhonchi or rales.  Abdominal:     General: There is no distension.     Palpations: Abdomen is soft.     Tenderness: There is no abdominal tenderness.  Musculoskeletal:        General: No tenderness.     Cervical back: Neck supple.  Lymphadenopathy:     Cervical: No cervical  adenopathy.  Skin:    General: Skin is warm and dry.     Capillary Refill: Capillary refill takes less than 2 seconds.     Findings: No rash.  Neurological:     Mental Status: She is alert and oriented to person, place, and time.  Psychiatric:        Behavior: Behavior normal.      There were no vitals filed for this visit.   on  RA BMI Readings from Last 3 Encounters:  07/23/22 34.60 kg/m  07/10/22 34.92 kg/m  07/07/22 33.83 kg/m   Wt Readings from Last 3 Encounters:  07/23/22 217 lb 9.6 oz (98.7 kg)  07/10/22 218 lb (98.9 kg)  07/07/22 216 lb (98 kg)     CBC    Component Value Date/Time   WBC 9.6 07/07/2022 1926   RBC 5.29 (H) 07/07/2022 1926   HGB 15.4 (H) 07/07/2022 1926   HGB 14.2 07/04/2021 1443   HCT 45.8 07/07/2022 1926   PLT 397 07/07/2022 1926   PLT 345 07/04/2021 1443   MCV 86.6 07/07/2022 1926   MCV 91.3 11/20/2012 1131   MCH 29.1 07/07/2022 1926   MCHC 33.6 07/07/2022 1926   RDW 13.0 07/07/2022 1926   LYMPHSABS 2.6 06/12/2022 0855   MONOABS 0.6 06/12/2022 0855   EOSABS 0.2 06/12/2022 0855   BASOSABS 0.1 06/12/2022 0855    ***  Chest Imaging: ***  Pulmonary Functions Testing Results:     No data to display          FeNO: ***  Pathology: ***  Echocardiogram: ***  Heart Catheterization: ***  Assessment & Plan:   No diagnosis found.  Discussion: ***   Current Outpatient Medications:    Ascorbic Acid (VITAMIN C) 500 MG CAPS, Take 1,000 mg by mouth every morning. , Disp: , Rfl:    b complex vitamins tablet, Take 1 tablet by mouth daily., Disp: , Rfl:    CALCIUM-MAGNESIUM PO, Take 3 tablets by mouth daily. , Disp: , Rfl:    cetirizine (ZYRTEC) 10 MG tablet, Take 10 mg by mouth daily., Disp: , Rfl:    clobetasol ointment (TEMOVATE) 0.05 %, SMARTSIG:1 sparingly Topical Twice Daily, Disp: , Rfl:    cyclobenzaprine (FLEXERIL) 10 MG tablet, Take 10 mg by mouth as needed for muscle spasms. , Disp: , Rfl:    EPINEPHrine 0.3  mg/0.3 mL IJ SOAJ injection, Inject 0.3 mg into the muscle as needed for anaphylaxis. (Patient not taking: Reported on 07/10/2022), Disp: 1 each, Rfl: 1   hydrochlorothiazide (HYDRODIURIL) 25 MG tablet, TAKE 1 TABLET BY MOUTH DAILY, Disp: 90 tablet, Rfl: 3   levothyroxine (SYNTHROID) 50 MCG tablet, Take 1 tablet (50 mcg total) by mouth daily., Disp: 90 tablet, Rfl: 0   meclizine (ANTIVERT) 25 MG tablet, TAKE 2 TABLET (50 MG TOTAL) BY MOUTH 2 (TWO) TIMES DAILY AS NEEDED (FOR TRAVEL)., Disp: 30 tablet, Rfl: 0   Misc Natural Products (RED WINE EXTRACT PO), Take by mouth., Disp: , Rfl:    Multiple Vitamins-Iron (MULTIVITAMIN/IRON) TABS, Take 1 tablet by mouth daily., Disp: , Rfl:    nystatin powder, Apply topically 2 (two) times daily. As needed for rash, Disp: 30 g, Rfl: 5   Rimegepant Sulfate (NURTEC) 75 MG TBDP, Take 1 tablet by mouth as needed (for migraine)., Disp: 8 tablet, Rfl: 12   sulindac (CLINORIL) 200 MG tablet, Take 1 tablet (200 mg total) by mouth 2 (two) times daily as needed (headache). Reported on 09/23/2015, Disp: 30 tablet, Rfl: 5   SUMAtriptan (IMITREX) 6 MG/0.5ML SOLN injection, Inject 6 mg into the skin every 2 (two) hours as needed for migraine. Reported on 09/23/2015, Disp: , Rfl:    traMADol (ULTRAM) 50 MG tablet, Take 50 mg by mouth as needed for severe pain. , Disp: , Rfl:    verapamil (CALAN-SR) 180 MG CR tablet, TAKE 1 TABLET BY MOUTH AT BEDTIME, Disp: 90 tablet, Rfl: 3  Current Facility-Administered Medications:    methylPREDNISolone acetate (DEPO-MEDROL) injection 120 mg, 120 mg, Intramuscular, Once, Panosh, [name], MD  I spent *** minutes dedicated to the care of this patient on the date of this encounter to include pre-visit review of records, face-to-face time with the patient discussing conditions above, post visit ordering of testing, clinical documentation with the electronic health record, making appropriate referrals as documented, and communicating necessary findings  to members of the patients care team.   Garner Nash, DO [facility] 07/23/2022 4:23 PM

## 2022-07-23 NOTE — Progress Notes (Signed)
Synopsis: Referred in February 2024 for pulmonary nodule by Regis Bill [name], MD  Subjective:   PATIENT ID: Laura Smith GENDER: female DOB: Apr 04, 1961, MRN: [id number]  Chief Complaint  Patient presents with   Consult    Lung nodule    This is a 62 year old female, past medical history of allergies, endometriosis, family history of breast cancer colon cancer prostate cancer, she has a history of basal cell carcinoma of the skin.  She had a CT scan dating back from 2021 that showed a small groundglass lesion in the periphery of the left upper lobe on the medial side of the pleura.  Patient had a repeat CT scan in follow-up that has shown some slight enlargement of this groundglass opacity.  She was referred here for consideration of bronchoscopy and biopsy.  She also has a small right upper lobe 4 mm groundglass opacity.    Past Medical History:  Diagnosis Date   Allergy    Breast cyst    Endometriosis    surgery laser 2001   Family history of breast cancer    Family history of colon cancer    Family history of prostate cancer    Hemorrhoids    Hx of basal cell carcinoma    Hypertension    Migraine    Thyroid disease    Urinary tract infection      Family History  Problem Relation Age of Onset   Breast cancer Mother        Age 93   Colon cancer Mother 7   Breast cancer Cousin 77       paternal first cousin   Heart disease Father 42       Heart attack   Hypertension Paternal Grandmother    Heart disease Paternal Grandmother    Stroke Paternal Grandmother    Cancer Paternal Grandmother        Lung   Cancer Paternal Grandfather        Prostate   Stroke Maternal Grandfather    COPD Maternal Uncle    Liver cancer Paternal Uncle    Congestive Heart Failure Maternal Grandmother    Hypertension Paternal Aunt    Colon polyps Neg Hx    Stomach cancer Neg Hx    Rectal cancer Neg Hx    Esophageal cancer Neg Hx      Past Surgical History:  Procedure Laterality  Date   APPENDECTOMY  1992   AT C/S   BACK SURGERY  06/06/2020   L3-4   CESAREAN SECTION  1992   APPENDECTOMY SAME TIME   COLONOSCOPY  2015   HYSTEROSCOPY  08/1999   AT TIME OF LAPAROSCOPY   PELVIC LAPAROSCOPY  08/1999   AND HYSTEROSCOPY--ENDOMETRIOSIS   POLYPECTOMY      Social History   Socioeconomic History   Marital status: Married    Spouse name: [name]   Number of children: 1   Years of education: Not on file   Highest education level: Associate degree: occupational, Hotel manager, or vocational program  Occupational History   Not on file  Tobacco Use   Smoking status: Never   Smokeless tobacco: Never  Vaping Use   Vaping Use: Never used  Substance and Sexual Activity   Alcohol use: No    Alcohol/week: 0.0 standard drinks of alcohol   Drug use: No   Sexual activity: Not Currently    Partners: Male    Birth control/protection: Post-menopausal, Abstinence  Other Topics Concern   Not on file  Social History Narrative   h h of 2 Pet cat   Daughter /  Married    g1 p1   Works at [employer] Strain: Not on [employer] Insecurity: Not on file  Transportation Needs: Not on file  Physical Activity: Not on file  Stress: Not on file  Social Connections: Not on file  Intimate Partner Violence: Not on file     Allergies  Allergen Reactions   Bee Venom Anaphylaxis    Very Large local reaction, leg and sob  seen in ed [employer] years ago    Etodolac Hives    Possible anaphylaxis  With vomiting and throat feeling tight  ED visit 8 14    Latex Hives, Rash and Other (See Comments)    Causes blisters   Other Hives, Rash and Other (See Comments)    Gel used for ultrasound-causes blisters also    Adhesive [Tape] Itching and Rash     Outpatient Medications Prior to Visit  Medication Sig Dispense Refill   cetirizine (ZYRTEC) 10 MG tablet Take 10 mg by mouth daily.     Ascorbic Acid (VITAMIN C) 500 MG CAPS Take  1,000 mg by mouth every morning.      b complex vitamins tablet Take 1 tablet by mouth daily.     CALCIUM-MAGNESIUM PO Take 3 tablets by mouth daily.      clobetasol ointment (TEMOVATE) 0.05 % SMARTSIG:1 sparingly Topical Twice Daily     cyclobenzaprine (FLEXERIL) 10 MG tablet Take 10 mg by mouth as needed for muscle spasms.      EPINEPHrine 0.3 mg/0.3 mL IJ SOAJ injection Inject 0.3 mg into the muscle as needed for anaphylaxis. (Patient not taking: Reported on 07/10/2022) 1 each 1   hydrochlorothiazide (HYDRODIURIL) 25 MG tablet TAKE 1 TABLET BY MOUTH DAILY 90 tablet 3   levothyroxine (SYNTHROID) 50 MCG tablet Take 1 tablet (50 mcg total) by mouth daily. 90 tablet 0   meclizine (ANTIVERT) 25 MG tablet TAKE 2 TABLET (50 MG TOTAL) BY MOUTH 2 (TWO) TIMES DAILY AS NEEDED (FOR TRAVEL). 30 tablet 0   Misc Natural Products (RED WINE EXTRACT PO) Take by mouth.     Multiple Vitamins-Iron (MULTIVITAMIN/IRON) TABS Take 1 tablet by mouth daily.     nystatin powder Apply topically 2 (two) times daily. As needed for rash 30 g 5   Rimegepant Sulfate (NURTEC) 75 MG TBDP Take 1 tablet by mouth as needed (for migraine). 8 tablet 12   sulindac (CLINORIL) 200 MG tablet Take 1 tablet (200 mg total) by mouth 2 (two) times daily as needed (headache). Reported on 09/23/2015 30 tablet 5   SUMAtriptan (IMITREX) 6 MG/0.5ML SOLN injection Inject 6 mg into the skin every 2 (two) hours as needed for migraine. Reported on 09/23/2015     traMADol (ULTRAM) 50 MG tablet Take 50 mg by mouth as needed for severe pain.      verapamil (CALAN-SR) 180 MG CR tablet TAKE 1 TABLET BY MOUTH AT BEDTIME 90 tablet 3   Facility-Administered Medications Prior to Visit  Medication Dose Route Frequency Provider Last Rate Last Admin   methylPREDNISolone acetate (DEPO-MEDROL) injection 120 mg  120 mg Intramuscular Once Panosh, [name], MD        Review of Systems  Constitutional:  Negative for chills, fever, malaise/fatigue and weight loss.  HENT:   Negative for hearing loss, sore throat and tinnitus.   Eyes:  Negative for blurred vision and double vision.  Respiratory:  Negative for cough, hemoptysis, sputum production, shortness of breath, wheezing and stridor.   Cardiovascular:  Negative for chest pain, palpitations, orthopnea, leg swelling and PND.  Gastrointestinal:  Negative for abdominal pain, constipation, diarrhea, heartburn, nausea and vomiting.  Genitourinary:  Negative for dysuria, hematuria and urgency.  Musculoskeletal:  Negative for joint pain and myalgias.  Skin:  Negative for itching and rash.  Neurological:  Negative for dizziness, tingling, weakness and headaches.  Endo/Heme/Allergies:  Negative for environmental allergies. Does not bruise/bleed easily.  Psychiatric/Behavioral:  Negative for depression. The patient is not nervous/anxious and does not have insomnia.   All other systems reviewed and are negative.    Objective:  Physical Exam Vitals reviewed.  Constitutional:      General: She is not in acute distress.    Appearance: She is well-developed.  HENT:     Head: Normocephalic and atraumatic.  Eyes:     General: No scleral icterus.    Conjunctiva/sclera: Conjunctivae normal.     Pupils: Pupils are equal, round, and reactive to light.  Neck:     Vascular: No JVD.     Trachea: No tracheal deviation.  Cardiovascular:     Rate and Rhythm: Normal rate and regular rhythm.     Heart sounds: Normal heart sounds. No murmur heard. Pulmonary:     Effort: Pulmonary effort is normal. No tachypnea, accessory muscle usage or respiratory distress.     Breath sounds: Normal breath sounds. No stridor. No wheezing, rhonchi or rales.  Abdominal:     General: Bowel sounds are normal. There is no distension.     Palpations: Abdomen is soft.     Tenderness: There is no abdominal tenderness.  Musculoskeletal:        General: No tenderness.     Cervical back: Neck supple.  Lymphadenopathy:     Cervical: No cervical  adenopathy.  Skin:    General: Skin is warm and dry.     Capillary Refill: Capillary refill takes less than 2 seconds.     Findings: No rash.  Neurological:     Mental Status: She is alert and oriented to person, place, and time.  Psychiatric:        Behavior: Behavior normal.      Vitals:   07/23/22 1535  BP: (!) 130/90  Pulse: 80  SpO2: 97%  Weight: 217 lb 9.6 oz (98.7 kg)  Height: 5' 6.5" (1.689 m)   97% on RA BMI Readings from Last 3 Encounters:  07/23/22 34.60 kg/m  07/10/22 34.92 kg/m  07/07/22 33.83 kg/m   Wt Readings from Last 3 Encounters:  07/23/22 217 lb 9.6 oz (98.7 kg)  07/10/22 218 lb (98.9 kg)  07/07/22 216 lb (98 kg)     CBC    Component Value Date/Time   WBC 9.6 07/07/2022 1926   RBC 5.29 (H) 07/07/2022 1926   HGB 15.4 (H) 07/07/2022 1926   HGB 14.2 07/04/2021 1443   HCT 45.8 07/07/2022 1926   PLT 397 07/07/2022 1926   PLT 345 07/04/2021 1443   MCV 86.6 07/07/2022 1926   MCV 91.3 11/20/2012 1131   MCH 29.1 07/07/2022 1926   MCHC 33.6 07/07/2022 1926   RDW 13.0 07/07/2022 1926   LYMPHSABS 2.6 06/12/2022 0855   MONOABS 0.6 06/12/2022 0855   EOSABS 0.2 06/12/2022 0855   BASOSABS 0.1 06/12/2022 0855     Chest Imaging:  CT chest without contrast 05/22/2022: Groundglass opacity  within the left upper lobe slowly enlarging over time.  Concerning for indolent neoplasm. The patient's images have been independently reviewed by me.    Pulmonary Functions Testing Results:     No data to display          FeNO:   Pathology:   Echocardiogram:   Heart Catheterization:     Assessment & Plan:     ICD-10-CM   1. Left upper lobe pulmonary nodule  R91.1 Ambulatory referral to Cardiothoracic Surgery    NM PET Image Initial (PI) Skull Base To Thigh (F-18 FDG)    Pulmonary Function Test    2. Ground glass opacity present on imaging of lung  R91.8     3. Nonsmoker  Z78.9       Discussion:  This is a 62 year old female seen today  for evaluation of left upper lobe pulmonary nodule.  Has a groundglass opacity within the left upper lobe that has slowly enlarged over time concerning for malignancy.  Plan: Today in the office we talked about all the various options to include watchful waiting, consideration for bronchoscopy and biopsy or potentially for consideration of bronchoscopy biopsy dye marking and same-day resection.  I think that she would be a good candidate potentially for a single anesthetic event as a non-smoker. This lesion is groundglass in nature and would benefit from being dye marked for the thoracic surgery colleagues.  I will send a referral over to Dr. Kipp Brood to meet with her and discuss after PET scan and PFTs have been complete. Patient is agreeable to this plan.  Updated family at bedside today as well as calling patient's daughter.  RTC in 3 months after these procedures have been complete.   Current Outpatient Medications:    cetirizine (ZYRTEC) 10 MG tablet, Take 10 mg by mouth daily., Disp: , Rfl:    Ascorbic Acid (VITAMIN C) 500 MG CAPS, Take 1,000 mg by mouth every morning. , Disp: , Rfl:    b complex vitamins tablet, Take 1 tablet by mouth daily., Disp: , Rfl:    CALCIUM-MAGNESIUM PO, Take 3 tablets by mouth daily. , Disp: , Rfl:    clobetasol ointment (TEMOVATE) 0.05 %, SMARTSIG:1 sparingly Topical Twice Daily, Disp: , Rfl:    cyclobenzaprine (FLEXERIL) 10 MG tablet, Take 10 mg by mouth as needed for muscle spasms. , Disp: , Rfl:    EPINEPHrine 0.3 mg/0.3 mL IJ SOAJ injection, Inject 0.3 mg into the muscle as needed for anaphylaxis. (Patient not taking: Reported on 07/10/2022), Disp: 1 each, Rfl: 1   hydrochlorothiazide (HYDRODIURIL) 25 MG tablet, TAKE 1 TABLET BY MOUTH DAILY, Disp: 90 tablet, Rfl: 3   levothyroxine (SYNTHROID) 50 MCG tablet, Take 1 tablet (50 mcg total) by mouth daily., Disp: 90 tablet, Rfl: 0   meclizine (ANTIVERT) 25 MG tablet, TAKE 2 TABLET (50 MG TOTAL) BY MOUTH 2  (TWO) TIMES DAILY AS NEEDED (FOR TRAVEL)., Disp: 30 tablet, Rfl: 0   Misc Natural Products (RED WINE EXTRACT PO), Take by mouth., Disp: , Rfl:    Multiple Vitamins-Iron (MULTIVITAMIN/IRON) TABS, Take 1 tablet by mouth daily., Disp: , Rfl:    nystatin powder, Apply topically 2 (two) times daily. As needed for rash, Disp: 30 g, Rfl: 5   Rimegepant Sulfate (NURTEC) 75 MG TBDP, Take 1 tablet by mouth as needed (for migraine)., Disp: 8 tablet, Rfl: 12   sulindac (CLINORIL) 200 MG tablet, Take 1 tablet (200 mg total) by mouth 2 (two) times daily as needed (headache).  Reported on 09/23/2015, Disp: 30 tablet, Rfl: 5   SUMAtriptan (IMITREX) 6 MG/0.5ML SOLN injection, Inject 6 mg into the skin every 2 (two) hours as needed for migraine. Reported on 09/23/2015, Disp: , Rfl:    traMADol (ULTRAM) 50 MG tablet, Take 50 mg by mouth as needed for severe pain. , Disp: , Rfl:    verapamil (CALAN-SR) 180 MG CR tablet, TAKE 1 TABLET BY MOUTH AT BEDTIME, Disp: 90 tablet, Rfl: 3  Current Facility-Administered Medications:    methylPREDNISolone acetate (DEPO-MEDROL) injection 120 mg, 120 mg, Intramuscular, Once, Panosh, [name], MD   Garner Nash, DO [facility] 07/23/2022 4:25 PM

## 2022-07-23 NOTE — Patient Instructions (Signed)
Thank you for visiting Dr. Valeta Harms at [facility]. Today we recommend the following:  Orders Placed This Encounter  Procedures   NM PET Image Initial (PI) Skull Base To Thigh (F-18 FDG)   Ambulatory referral to Cardiothoracic Surgery   Pulmonary Function Test   Return in about 3 months (around 10/21/2022) for w/ Dr. Valeta Harms .    Please do your part to reduce the spread of COVID-19.

## 2022-07-24 ENCOUNTER — Encounter: Payer: Self-pay | Admitting: Hematology

## 2022-07-24 ENCOUNTER — Telehealth: Payer: Self-pay | Admitting: Pulmonary Disease

## 2022-07-24 ENCOUNTER — Inpatient Hospital Stay: Payer: BC Managed Care – PPO | Attending: Hematology | Admitting: Hematology

## 2022-07-24 ENCOUNTER — Other Ambulatory Visit: Payer: Self-pay

## 2022-07-24 VITALS — BP 136/68 | HR 96 | Temp 98.8°F | Resp 18 | Ht 66.5 in | Wt 218.4 lb

## 2022-07-24 DIAGNOSIS — Z1501 Genetic susceptibility to malignant neoplasm of breast: Secondary | ICD-10-CM | POA: Diagnosis not present

## 2022-07-24 DIAGNOSIS — Z9049 Acquired absence of other specified parts of digestive tract: Secondary | ICD-10-CM | POA: Insufficient documentation

## 2022-07-24 DIAGNOSIS — Z91030 Bee allergy status: Secondary | ICD-10-CM | POA: Insufficient documentation

## 2022-07-24 DIAGNOSIS — I10 Essential (primary) hypertension: Secondary | ICD-10-CM | POA: Insufficient documentation

## 2022-07-24 DIAGNOSIS — Z8719 Personal history of other diseases of the digestive system: Secondary | ICD-10-CM | POA: Insufficient documentation

## 2022-07-24 DIAGNOSIS — Z85828 Personal history of other malignant neoplasm of skin: Secondary | ICD-10-CM | POA: Diagnosis not present

## 2022-07-24 DIAGNOSIS — Z800 Family history of malignant neoplasm of digestive organs: Secondary | ICD-10-CM | POA: Diagnosis not present

## 2022-07-24 DIAGNOSIS — Z87440 Personal history of urinary (tract) infections: Secondary | ICD-10-CM | POA: Diagnosis not present

## 2022-07-24 DIAGNOSIS — Z79899 Other long term (current) drug therapy: Secondary | ICD-10-CM | POA: Diagnosis not present

## 2022-07-24 DIAGNOSIS — Z8042 Family history of malignant neoplasm of prostate: Secondary | ICD-10-CM | POA: Diagnosis not present

## 2022-07-24 DIAGNOSIS — Z803 Family history of malignant neoplasm of breast: Secondary | ICD-10-CM | POA: Insufficient documentation

## 2022-07-24 DIAGNOSIS — Z9189 Other specified personal risk factors, not elsewhere classified: Secondary | ICD-10-CM

## 2022-07-24 NOTE — Progress Notes (Signed)
[facility]   [phone] [phone]   Clinic Follow up Note   Patient Care Team: Panosh, [name], MD as PCP - General (Internal Medicine) Jarome Matin, MD as Consulting Physician (Dermatology) Thalia Bloodgood, Sims as Referring Physician (Optometry) Tamela Gammon, NP as Nurse Practitioner (Gynecology)  Date of Service:  07/24/2022  CHIEF COMPLAINT: f/u of  high risk for breast cancer   CURRENT THERAPY:  Surveillance   ASSESSMENT:  Laura Smith is a 62 y.o. female with   At high risk for breast cancer she has a strong family history of cancer-- mother with history of colorectal cancer and breast cancer, paternal grandfather with prostate cancer, and a paternal cousin with breast cancer. -she was referred to genetics in 01/2020 due to her family history. Using the [employer] statistic model to estimate her risk of developing breast cancer, her lifetime risk would be approximately 22.7%. Her genetic testing results were negative. -Based on the American Cancer Society recommendations for high risk (defined as 20% or greater lifetime risk of developing breast cancer), she should consider breast MRI screening as an adjunct to mammography annually.  -We discussed breast cancer risk reduction with tamoxifen which I do not strongly recommend  --she is clinically doing well. Physical exam was unremarkable. I feel comfortable with her continuing f/u with her GYN and PCP. I will see her as needed in future    PLAN: -order  MRI Breast scan -Encourage the pt to do yearly check ups with PCP, Mammogram, GYN -f/u as needed    INTERVAL HISTORY:  Laura Smith is here for a follow up of  high risk for breast cancer  She was last seen by me on 07/04/2021 She presents to the clinic alone. Pt state she has no concern about her breast. Pt has a mammogram schedule.   All other systems were reviewed with the patient and are negative.  MEDICAL HISTORY:  Past  Medical History:  Diagnosis Date   Allergy    Breast cyst    Endometriosis    surgery laser 2001   Family history of breast cancer    Family history of colon cancer    Family history of prostate cancer    Hemorrhoids    Hx of basal cell carcinoma    Hypertension    Migraine    Thyroid disease    Urinary tract infection     SURGICAL HISTORY: Past Surgical History:  Procedure Laterality Date   APPENDECTOMY  1992   AT C/S   BACK SURGERY  06/06/2020   L3-4   CESAREAN SECTION  1992   APPENDECTOMY SAME TIME   COLONOSCOPY  2015   HYSTEROSCOPY  08/1999   AT TIME OF LAPAROSCOPY   PELVIC LAPAROSCOPY  08/1999   AND HYSTEROSCOPY--ENDOMETRIOSIS   POLYPECTOMY      I have reviewed the social history and family history with the patient and they are unchanged from previous note.  ALLERGIES:  is allergic to bee venom, etodolac, latex, other, and adhesive [tape].  MEDICATIONS:  Current Outpatient Medications  Medication Sig Dispense Refill   Ascorbic Acid (VITAMIN C) 500 MG CAPS Take 1,000 mg by mouth every morning.      b complex vitamins tablet Take 1 tablet by mouth daily.     CALCIUM-MAGNESIUM PO Take 3 tablets by mouth daily.      cetirizine (ZYRTEC) 10 MG tablet Take 10 mg by mouth daily.     clobetasol ointment (TEMOVATE)  0.05 % SMARTSIG:1 sparingly Topical Twice Daily     cyclobenzaprine (FLEXERIL) 10 MG tablet Take 10 mg by mouth as needed for muscle spasms.      EPINEPHrine 0.3 mg/0.3 mL IJ SOAJ injection Inject 0.3 mg into the muscle as needed for anaphylaxis. (Patient not taking: Reported on 07/10/2022) 1 each 1   hydrochlorothiazide (HYDRODIURIL) 25 MG tablet TAKE 1 TABLET BY MOUTH DAILY 90 tablet 3   levothyroxine (SYNTHROID) 50 MCG tablet Take 1 tablet (50 mcg total) by mouth daily. 90 tablet 0   meclizine (ANTIVERT) 25 MG tablet TAKE 2 TABLET (50 MG TOTAL) BY MOUTH 2 (TWO) TIMES DAILY AS NEEDED (FOR TRAVEL). 30 tablet 0   Misc Natural Products (RED WINE EXTRACT PO) Take  by mouth.     Multiple Vitamins-Iron (MULTIVITAMIN/IRON) TABS Take 1 tablet by mouth daily.     nystatin powder Apply topically 2 (two) times daily. As needed for rash 30 g 5   Rimegepant Sulfate (NURTEC) 75 MG TBDP Take 1 tablet by mouth as needed (for migraine). 8 tablet 12   sulindac (CLINORIL) 200 MG tablet Take 1 tablet (200 mg total) by mouth 2 (two) times daily as needed (headache). Reported on 09/23/2015 30 tablet 5   SUMAtriptan (IMITREX) 6 MG/0.5ML SOLN injection Inject 6 mg into the skin every 2 (two) hours as needed for migraine. Reported on 09/23/2015     traMADol (ULTRAM) 50 MG tablet Take 50 mg by mouth as needed for severe pain.      verapamil (CALAN-SR) 180 MG CR tablet TAKE 1 TABLET BY MOUTH AT BEDTIME 90 tablet 3   Current Facility-Administered Medications  Medication Dose Route Frequency Provider Last Rate Last Admin   methylPREDNISolone acetate (DEPO-MEDROL) injection 120 mg  120 mg Intramuscular Once Panosh, [name], MD        PHYSICAL EXAMINATION: ECOG PERFORMANCE STATUS: 0 - Asymptomatic  Vitals:   07/24/22 1442  BP: 136/68  Pulse: 96  Resp: 18  Temp: 98.8 F (37.1 C)  SpO2: 97%   Wt Readings from Last 3 Encounters:  07/24/22 218 lb 6.4 oz (99.1 kg)  07/23/22 217 lb 9.6 oz (98.7 kg)  07/10/22 218 lb (98.9 kg)     GENERAL:alert, no distress and comfortable SKIN: skin color, texture, turgor are normal, no rashes or significant lesions EYES: normal, Conjunctiva are pink and non-injected, sclera clear NECK:(-) supple, thyroid normal size, non-tender, without nodularity LYMPH: (-)  no palpable lymphadenopathy in the cervical, axillary  ABDOMEN:(-) abdomen soft, non-tender and normal bowel sounds BREAST:Rt  breast no palpable mass, Lt Breast no palpable mass breast exam benign.     LABORATORY DATA:  I have reviewed the data as listed    Latest Ref Rng & Units 07/07/2022    7:26 PM 06/12/2022    8:55 AM 07/04/2021    2:43 PM  CBC  WBC 4.0 - 10.5 K/uL 9.6   7.6  11.1   Hemoglobin 12.0 - 15.0 g/dL 15.4  14.9  14.2   Hematocrit 36.0 - 46.0 % 45.8  44.6  42.7   Platelets 150 - 400 K/uL 397  349.0  345         Latest Ref Rng & Units 07/07/2022    7:26 PM 06/12/2022    8:55 AM 07/04/2021    2:43 PM  CMP  Glucose 70 - 99 mg/dL 110  102  104   BUN 8 - 23 mg/dL '16  18  18   '$ Creatinine 0.44 -  1.00 mg/dL 0.87  0.88  0.93   Sodium 135 - 145 mmol/L 138  140  139   Potassium 3.5 - 5.1 mmol/L 3.6  3.7  3.8   Chloride 98 - 111 mmol/L 99  103  102   CO2 22 - 32 mmol/L '28  30  30   '$ Calcium 8.9 - 10.3 mg/dL 10.0  9.2  9.5   Total Protein 6.0 - 8.3 g/dL  7.1  7.2   Total Bilirubin 0.2 - 1.2 mg/dL  0.6  0.4   Alkaline Phos 39 - 117 U/L  59  67   AST 0 - 37 U/L  23  20   ALT 0 - 35 U/L  19  17       RADIOGRAPHIC STUDIES: I have personally reviewed the radiological images as listed and agreed with the findings in the report. No results found.    No orders of the defined types were placed in this encounter.  All questions were answered. The patient knows to call the clinic with any problems, questions or concerns. No barriers to learning was detected. The total time spent in the appointment was 20 minutes.     Truitt Merle, MD 07/24/2022   Felicity Coyer, CMA, am acting as scribe for Truitt Merle, MD.   I have reviewed the above documentation for accuracy and completeness, and I agree with the above.

## 2022-07-24 NOTE — Telephone Encounter (Signed)
Attempted to call pt but unable to reach. Left message to return call.  

## 2022-07-24 NOTE — Assessment & Plan Note (Signed)
she has a strong family history of cancer-- mother with history of colorectal cancer and breast cancer, paternal grandfather with prostate cancer, and a paternal cousin with breast cancer. -she was referred to genetics in 01/2020 due to her family history. Using the [employer] statistic model to estimate her risk of developing breast cancer, her lifetime risk would be approximately 22.7%. Her genetic testing results were negative. -Based on the American Cancer Society recommendations for high risk (defined as 20% or greater lifetime risk of developing breast cancer), she should consider breast MRI screening as an adjunct to mammography annually.  -We discussed breast cancer risk reduction with tamoxifen --she is clinically doing well. Physical exam was unremarkable. I feel comfortable with her continuing f/u with her GYN and PCP. I will see her back in 1 year for routine f/u.

## 2022-07-24 NOTE — Telephone Encounter (Signed)
Spoke with patient. She stated after her appointment yesterday. She had a few questions. I stated I could send those to Dr. Valeta Harms and we can call her back once he responds. Pt states she will just wait until her next appointment. I advised she could always call back, and we will get her questions over to Dr. Valeta Harms. She verbalized understanding. Nothing further needed.

## 2022-07-27 ENCOUNTER — Encounter: Payer: Self-pay | Admitting: Pulmonary Disease

## 2022-07-31 ENCOUNTER — Ambulatory Visit (INDEPENDENT_AMBULATORY_CARE_PROVIDER_SITE_OTHER): Payer: BC Managed Care – PPO | Admitting: Pulmonary Disease

## 2022-07-31 DIAGNOSIS — R911 Solitary pulmonary nodule: Secondary | ICD-10-CM

## 2022-07-31 LAB — PULMONARY FUNCTION TEST
DL/VA % pred: 106 %
DL/VA: 4.39 ml/min/mmHg/L
DLCO cor % pred: 101 %
DLCO cor: 22.18 ml/min/mmHg
DLCO unc % pred: 107 %
DLCO unc: 23.44 ml/min/mmHg
FEF 25-75 Post: 1.68 L/sec
FEF 25-75 Pre: 1.81 L/sec
FEF2575-%Change-Post: -7 %
FEF2575-%Pred-Post: 69 %
FEF2575-%Pred-Pre: 75 %
FEV1-%Change-Post: 0 %
FEV1-%Pred-Post: 84 %
FEV1-%Pred-Pre: 85 %
FEV1-Post: 2.33 L
FEV1-Pre: 2.34 L
FEV1FVC-%Change-Post: 0 %
FEV1FVC-%Pred-Pre: 96 %
FEV6-%Change-Post: 0 %
FEV6-%Pred-Post: 89 %
FEV6-%Pred-Pre: 90 %
FEV6-Post: 3.07 L
FEV6-Pre: 3.09 L
FEV6FVC-%Change-Post: 0 %
FEV6FVC-%Pred-Post: 102 %
FEV6FVC-%Pred-Pre: 102 %
FVC-%Change-Post: 0 %
FVC-%Pred-Post: 87 %
FVC-%Pred-Pre: 87 %
FVC-Post: 3.11 L
FVC-Pre: 3.13 L
Post FEV1/FVC ratio: 75 %
Post FEV6/FVC ratio: 99 %
Pre FEV1/FVC ratio: 75 %
Pre FEV6/FVC Ratio: 99 %
RV % pred: 141 %
RV: 3.06 L
TLC % pred: 115 %
TLC: 6.26 L

## 2022-07-31 NOTE — Progress Notes (Signed)
Full PFT completed today 

## 2022-08-05 ENCOUNTER — Ambulatory Visit (HOSPITAL_COMMUNITY)
Admission: RE | Admit: 2022-08-05 | Discharge: 2022-08-05 | Disposition: A | Discharge status: Home / Self Care | Payer: BC Managed Care – PPO | Source: Ambulatory Visit | Attending: Pulmonary Disease | Admitting: Pulmonary Disease

## 2022-08-05 DIAGNOSIS — D492 Neoplasm of unspecified behavior of bone, soft tissue, and skin: Secondary | ICD-10-CM | POA: Diagnosis not present

## 2022-08-05 DIAGNOSIS — R911 Solitary pulmonary nodule: Secondary | ICD-10-CM | POA: Diagnosis not present

## 2022-08-05 LAB — GLUCOSE, CAPILLARY: Glucose-Capillary: 98 mg/dL (ref 70–99)

## 2022-08-05 MED ORDER — FLUDEOXYGLUCOSE F - 18 (FDG) INJECTION
11.9000 | Freq: Once | INTRAVENOUS | Status: AC
  Administered 2022-08-05: 10.91 via INTRAVENOUS

## 2022-08-06 NOTE — Progress Notes (Signed)
HL, lady coming your way to discuss combo case. She is seeing you in clinic soon. Slow growing upper lobe GGO  Thanks,  BLI  Garner Nash, DO [facility] 08/06/2022 9:53 AM

## 2022-08-07 ENCOUNTER — Encounter: Payer: Self-pay | Admitting: Pulmonary Disease

## 2022-08-14 ENCOUNTER — Ambulatory Visit: Payer: BC Managed Care – PPO | Admitting: Thoracic Surgery (Cardiothoracic Vascular Surgery)

## 2022-08-14 ENCOUNTER — Other Ambulatory Visit: Payer: Self-pay | Admitting: Thoracic Surgery (Cardiothoracic Vascular Surgery)

## 2022-08-14 ENCOUNTER — Encounter: Payer: Self-pay | Admitting: Thoracic Surgery (Cardiothoracic Vascular Surgery)

## 2022-08-14 VITALS — BP 144/81 | HR 76 | Resp 20 | Ht 66.5 in | Wt 219.0 lb

## 2022-08-14 DIAGNOSIS — R911 Solitary pulmonary nodule: Secondary | ICD-10-CM

## 2022-08-14 DIAGNOSIS — Z01818 Encounter for other preprocedural examination: Secondary | ICD-10-CM

## 2022-08-14 NOTE — Addendum Note (Signed)
Addended by: Lajuana Matte on: 08/14/2022 04:20 PM   Modules accepted: Orders

## 2022-08-14 NOTE — Progress Notes (Signed)
[address]       [place] [address]             [phone]                    Prescious L Dery [facility] Medical Record [id number] Date of Birth: 1961-01-28  Referring: Garner Nash, DO Primary Care: Burnis Medin, MD Primary Cardiologist: None  Chief Complaint:    Chief Complaint  Patient presents with   Lung Lesion    New patient PET 2/14, PEFTs 2/1, Chest CT 12/1    History of Present Illness:    Laura Smith 62 y.o. female presents for surgical evaluation of the left upper lobe groundglass opacity.  This originally was found incidentally, and has been followed for several years.  On most recent CT scan there has been some interval growth in size.  She is a lifelong non-smoker, but was only exposed to secondhand smoke for a short period.  She denies any shortness of breath or neurologic symptoms.    Smoking Hx: Lifelong non-smoker   Zubrod Score: At the time of surgery this patient's most appropriate activity status/level should be described as: '[x]'$     0    Normal activity, no symptoms '[]'$     1    Restricted in physical strenuous activity but ambulatory, able to do out light work '[]'$     2    Ambulatory and capable of self care, unable to do work activities, up and about               >50 % of waking hours                              '[]'$     3    Only limited self care, in bed greater than 50% of waking hours '[]'$     4    Completely disabled, no self care, confined to bed or chair '[]'$     5    Moribund   Past Medical History:  Diagnosis Date   Allergy    Breast cyst    Endometriosis    surgery laser 2001   Family history of breast cancer    Family history of colon cancer    Family history of prostate cancer    Hemorrhoids    Hx of basal cell carcinoma    Hypertension    Migraine    Thyroid disease    Urinary tract infection     Past Surgical History:  Procedure Laterality Date   APPENDECTOMY  1992   AT C/S   BACK SURGERY   06/06/2020   L3-4   CESAREAN SECTION  1992   APPENDECTOMY SAME TIME   COLONOSCOPY  2015   HYSTEROSCOPY  08/1999   AT TIME OF LAPAROSCOPY   PELVIC LAPAROSCOPY  08/1999   AND HYSTEROSCOPY--ENDOMETRIOSIS   POLYPECTOMY      Family History  Problem Relation Age of Onset   Breast cancer Mother        Age 22   Colon cancer Mother 37   Breast cancer Cousin 42       paternal first cousin   Heart disease Father 22       Heart attack   Hypertension Paternal Grandmother    Heart disease Paternal Grandmother    Stroke Paternal Grandmother    Cancer Paternal Grandmother  Lung   Cancer Paternal Grandfather        Prostate   Stroke Maternal Grandfather    COPD Maternal Uncle    Liver cancer Paternal Uncle    Congestive Heart Failure Maternal Grandmother    Hypertension Paternal Aunt    Colon polyps Neg Hx    Stomach cancer Neg Hx    Rectal cancer Neg Hx    Esophageal cancer Neg Hx      Social History   Tobacco Use  Smoking Status Never  Smokeless Tobacco Never    Social History   Substance and Sexual Activity  Alcohol Use No   Alcohol/week: 0.0 standard drinks of alcohol     Allergies  Allergen Reactions   Bee Venom Anaphylaxis    Very Large local reaction, leg and sob  seen in [place] years ago    Etodolac Hives    Possible anaphylaxis  With vomiting and throat feeling tight  ED visit 8 14    Latex Hives, Rash and Other (See Comments)    Causes blisters   Other Hives, Rash and Other (See Comments)    Gel used for ultrasound-causes blisters also    Adhesive [Tape] Itching and Rash    Current Outpatient Medications  Medication Sig Dispense Refill   Ascorbic Acid (VITAMIN C) 500 MG CAPS Take 1,000 mg by mouth every morning.      b complex vitamins tablet Take 1 tablet by mouth daily.     CALCIUM-MAGNESIUM PO Take 3 tablets by mouth daily.      cetirizine (ZYRTEC) 10 MG tablet Take 10 mg by mouth daily.     clobetasol ointment (TEMOVATE) 0.05 %  SMARTSIG:1 sparingly Topical Twice Daily     cyclobenzaprine (FLEXERIL) 10 MG tablet Take 10 mg by mouth as needed for muscle spasms.      EPINEPHrine 0.3 mg/0.3 mL IJ SOAJ injection Inject 0.3 mg into the muscle as needed for anaphylaxis. 1 each 1   hydrochlorothiazide (HYDRODIURIL) 25 MG tablet TAKE 1 TABLET BY MOUTH DAILY 90 tablet 3   levothyroxine (SYNTHROID) 50 MCG tablet Take 1 tablet (50 mcg total) by mouth daily. 90 tablet 0   meclizine (ANTIVERT) 25 MG tablet TAKE 2 TABLET (50 MG TOTAL) BY MOUTH 2 (TWO) TIMES DAILY AS NEEDED (FOR TRAVEL). 30 tablet 0   Misc Natural Products (RED WINE EXTRACT PO) Take by mouth.     Multiple Vitamins-Iron (MULTIVITAMIN/IRON) TABS Take 1 tablet by mouth daily.     nystatin powder Apply topically 2 (two) times daily. As needed for rash 30 g 5   Rimegepant Sulfate (NURTEC) 75 MG TBDP Take 1 tablet by mouth as needed (for migraine). 8 tablet 12   sulindac (CLINORIL) 200 MG tablet Take 1 tablet (200 mg total) by mouth 2 (two) times daily as needed (headache). Reported on 09/23/2015 30 tablet 5   SUMAtriptan (IMITREX) 6 MG/0.5ML SOLN injection Inject 6 mg into the skin every 2 (two) hours as needed for migraine. Reported on 09/23/2015     traMADol (ULTRAM) 50 MG tablet Take 50 mg by mouth as needed for severe pain.      verapamil (CALAN-SR) 180 MG CR tablet TAKE 1 TABLET BY MOUTH AT BEDTIME 90 tablet 3   Current Facility-Administered Medications  Medication Dose Route Frequency Provider Last Rate Last Admin   methylPREDNISolone acetate (DEPO-MEDROL) injection 120 mg  120 mg Intramuscular Once Panosh, [name], MD        Review of Systems  Constitutional:  Negative for malaise/fatigue and weight loss.  Respiratory:  Negative for cough and shortness of breath.   Cardiovascular:  Negative for chest pain.  Neurological: Negative.      PHYSICAL EXAMINATION: BP (!) 144/81 (BP Location: Left Arm, Patient Position: Sitting, Cuff Size: Normal)   Pulse 76   Resp 20    Ht 5' 6.5" (1.689 m)   Wt 219 lb (99.3 kg)   SpO2 94% Comment: RA  BMI 34.82 kg/m  Physical Exam Constitutional:      Appearance: Normal appearance.  HENT:     Head: Normocephalic and atraumatic.  Eyes:     Extraocular Movements: Extraocular movements intact.  Cardiovascular:     Rate and Rhythm: Normal rate.  Pulmonary:     Effort: Pulmonary effort is normal. No respiratory distress.  Abdominal:     General: There is no distension.  Musculoskeletal:        General: Normal range of motion.  Skin:    General: Skin is warm and dry.  Neurological:     General: No focal deficit present.     Mental Status: She is alert and oriented to person, place, and time.     Diagnostic Studies & Laboratory data:     Recent Radiology Findings:   NM PET Image Initial (PI) Skull Base To Thigh (F-18 FDG)  Result Date: 08/06/2022 CLINICAL DATA:  Initial treatment strategy for lung nodule. EXAM: NUCLEAR MEDICINE PET SKULL BASE TO THIGH TECHNIQUE: 10.9 mCi F-18 FDG was injected intravenously. Full-ring PET imaging was performed from the skull base to thigh after the radiotracer. CT data was obtained and used for attenuation correction and anatomic localization. Fasting blood glucose: 98 mg/dl COMPARISON:  CT chest 05/22/2022 FINDINGS: Mediastinal blood pool activity: SUV max 3.13 Liver activity: SUV max NA NECK: No hypermetabolic lymph nodes in the neck. Incidental CT findings: None. CHEST: Subsolid, ground-glass nodule within the subpleural aspect of the medial left apex is again seen. This measures 1.2 cm with SUV max of 1.39, image 11/7. Unchanged tiny solid nodule in the medial aspect of the right upper lobe measuring 4 mm, image 16/7. Too small to characterize by PET-CT. No tracer avid mediastinal or hilar lymph nodes. Incidental CT findings: None. ABDOMEN/PELVIS: No abnormal hypermetabolic activity within the liver, pancreas, adrenal glands, or spleen. No hypermetabolic lymph nodes in the abdomen  or pelvis. Incidental CT findings: None. SKELETON: No focal hypermetabolic activity to suggest skeletal metastasis. Incidental CT findings: None. IMPRESSION: 1. There is mild FDG uptake associated with the sub solid, ground-glass nodule within the medial left apex. The degree of FDG uptake is less than background mediastinal blood pool. This degree of FDG uptake is nonspecific and may be seen with inflammatory/infectious etiologies. Low grade pulmonary neoplasm such as adenocarcinoma may also present as a subsolid nodule with low level FDG uptake. 2. No signs of tracer avid nodal metastasis or distant metastatic disease. 3. Unchanged 4 mm solid nodule in the medial aspect of the right upper lobe. Too small to characterize by PET-CT. Electronically Signed   By: Kerby Moors M.D.   On: 08/06/2022 09:31       I have independently reviewed the above radiology studies  and reviewed the findings with the patient.   Recent Lab Findings: Lab Results  Component Value Date   WBC 9.6 07/07/2022   HGB 15.4 (H) 07/07/2022   HCT 45.8 07/07/2022   PLT 397 07/07/2022   GLUCOSE 110 (H) 07/07/2022   CHOL 196  06/12/2022   TRIG 120.0 06/12/2022   HDL 57.30 06/12/2022   LDLDIRECT 133.8 04/07/2013   LDLCALC 115 (H) 06/12/2022   ALT 19 06/12/2022   AST 23 06/12/2022   NA 138 07/07/2022   K 3.6 07/07/2022   CL 99 07/07/2022   CREATININE 0.87 07/07/2022   BUN 16 07/07/2022   CO2 28 07/07/2022   TSH 1.66 06/12/2022   HGBA1C 6.0 06/11/2021     PFTs:  - FVC: 87% - FEV1: 85% -DLCO: 101%      CHEST: Subsolid, ground-glass nodule within the subpleural aspect of the medial left apex is again seen. This measures 1.2 cm with SUV max of 1.39, image 11/7. Unchanged tiny solid nodule in the medial aspect of the right upper lobe measuring 4 mm, image 16/7. Too small to characterize by PET-CT.  Assessment / Plan:   62yo female with 1.2cm left upper lobe GGO with interval enlargement.  She is a life long  non-smoker.  We had a very long discussion about potential ways to treat this.  I recommended that she undergo a navigational bronchoscopy with biopsy marking followed by a left robotic assisted thoracoscopy with wedge resection and possible lobectomy.  She is agreeable to proceed.  Will schedule with Dr. Valeta Harms.       I  spent 60 minutes with  the patient face to face in counseling and coordination of care.    Lajuana Matte 08/14/2022 4:00 PM

## 2022-08-17 ENCOUNTER — Other Ambulatory Visit: Payer: Self-pay | Admitting: *Deleted

## 2022-08-17 ENCOUNTER — Telehealth: Payer: Self-pay | Admitting: Pulmonary Disease

## 2022-08-17 ENCOUNTER — Encounter: Payer: Self-pay | Admitting: *Deleted

## 2022-08-17 DIAGNOSIS — R911 Solitary pulmonary nodule: Secondary | ICD-10-CM

## 2022-08-17 NOTE — Telephone Encounter (Signed)
Dr Valeta Harms this is the patient you are doing the bronch on 3/21 in coordination with Dr Kipp Brood and were going to put in an order.

## 2022-08-17 NOTE — Telephone Encounter (Signed)
PCCM:  Orders for bronchoscopy placed.  Planned combo case on 21st.   Garner Nash, DO [facility] 08/17/2022 5:49 PM

## 2022-08-18 ENCOUNTER — Encounter (HOSPITAL_COMMUNITY): Payer: Self-pay | Admitting: Thoracic Surgery (Cardiothoracic Vascular Surgery)

## 2022-08-25 ENCOUNTER — Telehealth: Payer: Self-pay

## 2022-08-25 DIAGNOSIS — R350 Frequency of micturition: Secondary | ICD-10-CM

## 2022-08-25 DIAGNOSIS — R300 Dysuria: Secondary | ICD-10-CM

## 2022-08-25 MED ORDER — SULFAMETHOXAZOLE-TRIMETHOPRIM 800-160 MG PO TABS: 1.0000 | ORAL_TABLET | Freq: Two times a day (BID) | ORAL | 0 refills | Status: AC

## 2022-08-25 NOTE — Telephone Encounter (Signed)
Per ML: "Agree with Bactrim DS 1 tab PO BID x 3 days.  #6, no refill.  If no improvement of symptoms after 24 hours on ABTx, U/A and Urine culture needed."   Pt notified and voiced understanding. Rx sent.

## 2022-08-25 NOTE — Telephone Encounter (Signed)
Pt calling to report symptoms of UTI (dysuria, frequency). States that "her back is out and not sure if she can make an appt" and is requesting an Rx be sent to her pharmacy. States she has starting using AZO OTC to help w/ sxs in meantime. Please advise.   Last seen for AE on 07/10/2022.

## 2022-08-26 ENCOUNTER — Telehealth (HOSPITAL_COMMUNITY): Payer: Self-pay | Admitting: Thoracic Surgery (Cardiothoracic Vascular Surgery)

## 2022-08-26 ENCOUNTER — Telehealth: Payer: Self-pay | Admitting: Pulmonary Disease

## 2022-08-26 NOTE — Telephone Encounter (Signed)
Pt is scheduled for bronch with [facility] but being done in conjunction with [facility] so I didn't call the pt - everything being handled thru their office.  She is to have preadmit testing and may have been someone with them that called her.

## 2022-08-26 NOTE — Telephone Encounter (Signed)
Spoke to pt and she states someone called her to schedule a stress test. I informed pt that no one from this office schedule stress test. Pt verbalized understanding. Nothing further needed.

## 2022-08-26 NOTE — Telephone Encounter (Signed)
PT said someone named Amy called her to sched a "stress Test" as a pre-surgery test. Her surgery is scheduled on 3/21 but she has a bad back (disc issues) so she is not even sure if she will opt for surgery then.  Pls call to advise. I was not sure if it was Amy RN or if one of the PCC's called her. Routing to both.

## 2022-08-26 NOTE — Telephone Encounter (Signed)
Patient called and cancelled scheduled [facility] for reason below:  08/26/2022 3:15 PM [id number], SHARON S  Cancel Rsn: Patient (Patients' back went out. She will call back to reschedule.)   Order will be removed from the active [facility]. When patient calls back to reschedule we can reinstate the order.   Thank you.

## 2022-08-27 ENCOUNTER — Other Ambulatory Visit: Payer: Self-pay | Admitting: *Deleted

## 2022-08-28 ENCOUNTER — Encounter (HOSPITAL_COMMUNITY): Payer: BC Managed Care – PPO

## 2022-09-03 DIAGNOSIS — Z9889 Other specified postprocedural states: Secondary | ICD-10-CM | POA: Diagnosis not present

## 2022-09-03 DIAGNOSIS — M47816 Spondylosis without myelopathy or radiculopathy, lumbar region: Secondary | ICD-10-CM | POA: Diagnosis not present

## 2022-09-03 DIAGNOSIS — G8929 Other chronic pain: Secondary | ICD-10-CM | POA: Diagnosis not present

## 2022-09-04 ENCOUNTER — Ambulatory Visit (INDEPENDENT_AMBULATORY_CARE_PROVIDER_SITE_OTHER): Payer: BC Managed Care – PPO | Admitting: Obstetrics & Gynecology

## 2022-09-04 ENCOUNTER — Encounter: Payer: Self-pay | Admitting: Obstetrics & Gynecology

## 2022-09-04 VITALS — BP 112/70 | HR 100 | Temp 98.7°F

## 2022-09-04 DIAGNOSIS — R300 Dysuria: Secondary | ICD-10-CM | POA: Diagnosis not present

## 2022-09-04 NOTE — Progress Notes (Signed)
    Laura Smith [birth year] [id number]        62 y.o.  G1P1001   RP: Burning and frequency of urination  HPI: Pt c/o burning with urination, frequency, side of back pain.  No fever.  Frequently has irritative bladder symptoms without bladder infection.  Per chart, h/o Interstitial Cystitis, patient is not aware of that diagnosis.  Abstinent.  No PMB.  No vaginal discharge, no odor or itching. Has Lumbar pains, probable disc disease for which she is out of work.     OB History  Gravida Para Term Preterm AB Living  1 1 1     1   SAB IAB Ectopic Multiple Live Births               # Outcome Date GA Lbr Len/2nd Weight Sex Delivery Anes PTL Lv  1 Term             Past medical history,surgical history, problem list, medications, allergies, family history and social history were all reviewed and documented in the EPIC chart.   Directed ROS with pertinent positives and negatives documented in the history of present illness/assessment and plan.  Exam:  Vitals:   09/04/22 1018  BP: 112/70  Pulse: 100  Temp: 98.7 F (37.1 C)  TempSrc: Oral  SpO2: 99%   General appearance:  Normal  CVAT Neg bilaterally  Tender intercostal area at the left axillary line  Gynecologic exam: Deferred  U/A: Amber slightly cloudy, Nit Neg, Pro 1+, WBC 0-5, RBC Neg, Bacteria Neg.  No Crystal.  No Cast.  No yeast.  Pending U. Culture.   Assessment/Plan:  62 y.o. G1P1001   1. Burning with urination Pt c/o burning with urination, frequency, side of back pain.  No fever.  Frequently has irritative bladder symptoms without bladder infection.  Per chart, h/o Interstitial Cystitis, patient is not aware of that diagnosis.  Abstinent.  No PMB.  No vaginal discharge, no odor or itching. Has Lumbar pains, probable disc disease for which she is out of work.    U/A no significant abnormality.  Pending U. Culture.  Counseling done on avoiding bladder irritants such as caffeine, tea, chocolate.  Increase water  intake.  Offered a Urology referral to reassess the probable Dx of Interstitial Cystitis.  Prefers to change her nutritional habits and increase water intake first.  Will call back if symptoms persist or worsen, to be referred to Urology. - Urinalysis,Complete w/RFL Culture   Princess Bruins MD, 10:28 AM 09/04/2022

## 2022-09-07 ENCOUNTER — Other Ambulatory Visit: Payer: Self-pay

## 2022-09-07 ENCOUNTER — Encounter: Payer: Self-pay | Admitting: Obstetrics & Gynecology

## 2022-09-07 DIAGNOSIS — R300 Dysuria: Secondary | ICD-10-CM

## 2022-09-07 LAB — URINALYSIS, COMPLETE W/RFL CULTURE
Bacteria, UA: NONE SEEN /HPF
Glucose, UA: NEGATIVE
Hyaline Cast: NONE SEEN /LPF
Ketones, ur: NEGATIVE
Leukocyte Esterase: NEGATIVE
Nitrites, Initial: NEGATIVE
RBC / HPF: NONE SEEN /HPF (ref 0–2)
Specific Gravity, Urine: 1.02 (ref 1.001–1.035)
pH: 6.5 (ref 5.0–8.0)

## 2022-09-07 NOTE — Telephone Encounter (Signed)
Dr. Marguerita Merles, I asked the lab to check on the urine culture result.  "..the culture was not ran. I am not sure how this was an oversight on the ref. lab but I am escalating the concern and have notified our acct rep. At this point the sample has expired and cannot be added on."

## 2022-09-08 ENCOUNTER — Other Ambulatory Visit (HOSPITAL_COMMUNITY): Payer: BC Managed Care – PPO

## 2022-09-09 ENCOUNTER — Other Ambulatory Visit: Payer: Self-pay

## 2022-09-09 ENCOUNTER — Other Ambulatory Visit: Payer: Self-pay | Admitting: Internal Medicine

## 2022-09-09 DIAGNOSIS — R300 Dysuria: Secondary | ICD-10-CM

## 2022-09-09 DIAGNOSIS — E039 Hypothyroidism, unspecified: Secondary | ICD-10-CM

## 2022-09-10 ENCOUNTER — Encounter (HOSPITAL_COMMUNITY): Payer: Self-pay

## 2022-09-10 ENCOUNTER — Other Ambulatory Visit: Payer: BC Managed Care – PPO

## 2022-09-10 ENCOUNTER — Ambulatory Visit (HOSPITAL_COMMUNITY): Admit: 2022-09-10 | Payer: BC Managed Care – PPO | Admitting: Pulmonary Disease

## 2022-09-10 DIAGNOSIS — R300 Dysuria: Secondary | ICD-10-CM | POA: Diagnosis not present

## 2022-09-10 DIAGNOSIS — M47816 Spondylosis without myelopathy or radiculopathy, lumbar region: Secondary | ICD-10-CM | POA: Diagnosis not present

## 2022-09-10 LAB — URINALYSIS, COMPLETE W/RFL CULTURE
Bacteria, UA: NONE SEEN /HPF
Bilirubin Urine: NEGATIVE
Casts: NONE SEEN /LPF
Crystals: NONE SEEN /HPF
Glucose, UA: NEGATIVE
Hgb urine dipstick: NEGATIVE
Hyaline Cast: NONE SEEN /LPF
Ketones, ur: NEGATIVE
Leukocyte Esterase: NEGATIVE
Nitrites, Initial: NEGATIVE
Protein, ur: NEGATIVE
RBC / HPF: NONE SEEN /HPF (ref 0–2)
Specific Gravity, Urine: 1.02 (ref 1.001–1.035)
WBC, UA: NONE SEEN /HPF (ref 0–5)
Yeast: NONE SEEN /HPF
pH: 7 (ref 5.0–8.0)

## 2022-09-10 LAB — NO CULTURE INDICATED

## 2022-09-10 SURGERY — BRONCHOSCOPY, WITH BIOPSY USING ELECTROMAGNETIC NAVIGATION
Anesthesia: General

## 2022-09-10 SURGERY — WEDGE RESECTION, LUNG, ROBOT-ASSISTED, THORACOSCOPIC
Anesthesia: General | Site: Chest | Laterality: Left

## 2022-09-11 NOTE — Telephone Encounter (Signed)
Will resch. in April for [place].

## 2022-09-15 DIAGNOSIS — M47816 Spondylosis without myelopathy or radiculopathy, lumbar region: Secondary | ICD-10-CM | POA: Diagnosis not present

## 2022-09-17 DIAGNOSIS — M47816 Spondylosis without myelopathy or radiculopathy, lumbar region: Secondary | ICD-10-CM | POA: Diagnosis not present

## 2022-09-21 ENCOUNTER — Encounter: Payer: Self-pay | Admitting: *Deleted

## 2022-09-21 ENCOUNTER — Other Ambulatory Visit: Payer: Self-pay | Admitting: *Deleted

## 2022-09-21 DIAGNOSIS — R911 Solitary pulmonary nodule: Secondary | ICD-10-CM

## 2022-09-22 DIAGNOSIS — M47816 Spondylosis without myelopathy or radiculopathy, lumbar region: Secondary | ICD-10-CM | POA: Diagnosis not present

## 2022-09-24 DIAGNOSIS — M47816 Spondylosis without myelopathy or radiculopathy, lumbar region: Secondary | ICD-10-CM | POA: Diagnosis not present

## 2022-09-24 NOTE — Telephone Encounter (Signed)
Spoke with patient and she was given detailed instructions about her STRESS TEST on 09/25/22.

## 2022-09-25 ENCOUNTER — Ambulatory Visit (HOSPITAL_COMMUNITY): Payer: BC Managed Care – PPO | Attending: Internal Medicine

## 2022-09-25 DIAGNOSIS — Z01818 Encounter for other preprocedural examination: Secondary | ICD-10-CM | POA: Diagnosis not present

## 2022-09-25 LAB — MYOCARDIAL PERFUSION IMAGING
LV dias vol: 89 mL (ref 46–106)
LV sys vol: 38 mL
Nuc Stress EF: 57 %
Peak HR: 117 {beats}/min
Rest HR: 79 {beats}/min
Rest Nuclear Isotope Dose: 10.2 mCi
SDS: 1
SRS: 7
SSS: 8
ST Depression (mm): 0 mm
Stress Nuclear Isotope Dose: 30.4 mCi
TID: 0.97

## 2022-09-25 MED ORDER — REGADENOSON 0.4 MG/5ML IV SOLN
0.4000 mg | Freq: Once | INTRAVENOUS | Status: AC
Start: 2022-09-25 — End: 2022-09-25
  Administered 2022-09-25: 0.4 mg via INTRAVENOUS

## 2022-09-25 MED ORDER — TECHNETIUM TC 99M TETROFOSMIN IV KIT
30.4000 | PACK | Freq: Once | INTRAVENOUS | Status: AC | PRN
  Administered 2022-09-25: 30.4 via INTRAVENOUS

## 2022-09-25 MED ORDER — TECHNETIUM TC 99M TETROFOSMIN IV KIT
10.2000 | PACK | Freq: Once | INTRAVENOUS | Status: AC | PRN
  Administered 2022-09-25: 10.2 via INTRAVENOUS

## 2022-09-29 DIAGNOSIS — M4316 Spondylolisthesis, lumbar region: Secondary | ICD-10-CM | POA: Diagnosis not present

## 2022-09-29 DIAGNOSIS — M47816 Spondylosis without myelopathy or radiculopathy, lumbar region: Secondary | ICD-10-CM | POA: Diagnosis not present

## 2022-10-01 DIAGNOSIS — M47816 Spondylosis without myelopathy or radiculopathy, lumbar region: Secondary | ICD-10-CM | POA: Diagnosis not present

## 2022-10-06 ENCOUNTER — Telehealth: Payer: Self-pay

## 2022-10-06 DIAGNOSIS — M47816 Spondylosis without myelopathy or radiculopathy, lumbar region: Secondary | ICD-10-CM | POA: Diagnosis not present

## 2022-10-06 NOTE — Telephone Encounter (Signed)
STD form completed and faxed to [facility] @ [phone]. Beginning LOA 10/14/22 through 12/14/22./8 week post op recovery

## 2022-10-08 DIAGNOSIS — M47816 Spondylosis without myelopathy or radiculopathy, lumbar region: Secondary | ICD-10-CM | POA: Diagnosis not present

## 2022-10-09 NOTE — Progress Notes (Signed)
Surgical Instructions    Your procedure is scheduled on Wednesday, 10/14/22.  Report to [facility] Main Entrance "A" at 5:30 A.M., then check in with the Admitting office.  Call this number if you have problems the morning of surgery:  [phone]   If you have any questions prior to your surgery date call [phone]: Open Monday-Friday 8am-4pm If you experience any cold or flu symptoms such as cough, fever, chills, shortness of breath, etc. between now and your scheduled surgery, please notify us at the above number     Remember:  Do not eat or drink after midnight the night before your surgery     Take these medicines the morning of surgery with A SIP OF WATER:  cetirizine (ZYRTEC)  levothyroxine (SYNTHROID)   IF NEEDED: cyclobenzaprine (FLEXERIL)  meclizine (ANTIVERT)  Rimegepant Sulfate (NURTEC)  traMADol (ULTRAM)   As of today, STOP taking any Aspirin (unless otherwise instructed by your surgeon) Aleve, Naproxen, Ibuprofen, Motrin, Advil, Goody's, BC's, all herbal medications, sulindac (CLINORIL), fish oil, and all vitamins.           Do not wear jewelry or makeup. Do not wear lotions, powders, perfumes or deodorant. Do not shave 48 hours prior to surgery.   Do not bring valuables to the hospital. Do not wear nail polish, gel polish, artificial nails, or any other type of covering on natural nails (fingers and toes) If you have artificial nails or gel coating that need to be removed by a nail salon, please have this removed prior to surgery. Artificial nails or gel coating may interfere with anesthesia's ability to adequately monitor your vital signs.  [facility] is not responsible for any belongings or valuables.    Do NOT Smoke (Tobacco/Vaping)  24 hours prior to your procedure  If you use a CPAP at night, you may bring your mask for your overnight stay.   Contacts, glasses, hearing aids, dentures or partials may not be worn into surgery, please bring cases for  these belongings   For patients admitted to the hospital, discharge time will be determined by your treatment team.   Patients discharged the day of surgery will not be allowed to drive home, and someone needs to stay with them for 24 hours.   SURGICAL WAITING ROOM VISITATION Patients having surgery or a procedure may have no more than 2 support people in the waiting area - these visitors may rotate.   Children under the age of 49 must have an adult with them who is not the patient. If the patient needs to stay at the hospital during part of their recovery, the visitor guidelines for inpatient rooms apply. Pre-op nurse will coordinate an appropriate time for 1 support person to accompany patient in pre-op.  This support person may not rotate.   Please refer to [email or web address] for the visitor guidelines for Inpatients (after your surgery is over and you are in a regular room).    Special instructions:    Oral Hygiene is also important to reduce your risk of infection.  Remember - BRUSH YOUR TEETH THE MORNING OF SURGERY WITH YOUR REGULAR TOOTHPASTE   [facility]- Preparing For Surgery  Before surgery, you can play an important role. Because skin is not sterile, your skin needs to be as free of germs as possible. You can reduce the number of germs on your skin by washing with CHG (chlorahexidine gluconate) Soap before surgery.  CHG is an antiseptic cleaner which kills germs and bonds with the  skin to continue killing germs even after washing.     Please do not use if you have an allergy to CHG or antibacterial soaps. If your skin becomes reddened/irritated stop using the CHG.  Do not shave (including legs and underarms) for at least 48 hours prior to first CHG shower. It is OK to shave your face.  Please follow these instructions carefully.     Shower the NIGHT BEFORE SURGERY and the MORNING OF SURGERY with CHG Soap.   If you chose  to wash your hair, wash your hair first as usual with your normal shampoo. After you shampoo, rinse your hair and body thoroughly to remove the shampoo.  Then [employer] and genitals (private parts) with your normal soap and rinse thoroughly to remove soap.  After that Use CHG Soap as you would any other liquid soap. You can apply CHG directly to the skin and wash gently with a scrungie or a clean washcloth.   Apply the CHG Soap to your body ONLY FROM THE NECK DOWN.  Do not use on open wounds or open sores. Avoid contact with your eyes, ears, mouth and genitals (private parts). Wash Face and genitals (private parts)  with your normal soap.   Wash thoroughly, paying special attention to the area where your surgery will be performed.  Thoroughly rinse your body with warm water from the neck down.  DO NOT shower/wash with your normal soap after using and rinsing off the CHG Soap.  Pat yourself dry with a CLEAN TOWEL.  Wear CLEAN PAJAMAS to bed the night before surgery  Place CLEAN SHEETS on your bed the night before your surgery  DO NOT SLEEP WITH PETS.   Day of Surgery: Take a shower with CHG soap. Wear Clean/Comfortable clothing the morning of surgery Do not apply any deodorants/lotions.   Remember to brush your teeth WITH YOUR REGULAR TOOTHPASTE.    If you received a COVID test during your pre-op visit, it is requested that you wear a mask when out in public, stay away from anyone that may not be feeling well, and notify your surgeon if you develop symptoms. If you have been in contact with anyone that has tested positive in the last 10 days, please notify your surgeon.    Please read over the following fact sheets that you were given.

## 2022-10-12 ENCOUNTER — Encounter (HOSPITAL_COMMUNITY)
Admission: RE | Admit: 2022-10-12 | Discharge: 2022-10-12 | Disposition: A | Discharge status: Home / Self Care | Payer: BC Managed Care – PPO | Source: Ambulatory Visit | Attending: Pulmonary Disease | Admitting: Pulmonary Disease

## 2022-10-12 ENCOUNTER — Encounter (HOSPITAL_COMMUNITY): Payer: Self-pay

## 2022-10-12 ENCOUNTER — Other Ambulatory Visit: Payer: Self-pay

## 2022-10-12 ENCOUNTER — Ambulatory Visit (HOSPITAL_COMMUNITY)
Admission: RE | Admit: 2022-10-12 | Discharge: 2022-10-12 | Disposition: A | Discharge status: Home / Self Care | Payer: BC Managed Care – PPO | Source: Ambulatory Visit | Attending: Thoracic Surgery (Cardiothoracic Vascular Surgery) | Admitting: Thoracic Surgery (Cardiothoracic Vascular Surgery)

## 2022-10-12 VITALS — BP 130/67 | HR 90 | Temp 98.2°F | Resp 20 | Ht 67.0 in | Wt 218.2 lb

## 2022-10-12 DIAGNOSIS — J9811 Atelectasis: Secondary | ICD-10-CM | POA: Diagnosis not present

## 2022-10-12 DIAGNOSIS — R911 Solitary pulmonary nodule: Secondary | ICD-10-CM | POA: Insufficient documentation

## 2022-10-12 DIAGNOSIS — C3412 Malignant neoplasm of upper lobe, left bronchus or lung: Secondary | ICD-10-CM | POA: Diagnosis not present

## 2022-10-12 DIAGNOSIS — Z825 Family history of asthma and other chronic lower respiratory diseases: Secondary | ICD-10-CM | POA: Diagnosis not present

## 2022-10-12 DIAGNOSIS — Z01818 Encounter for other preprocedural examination: Secondary | ICD-10-CM

## 2022-10-12 DIAGNOSIS — J939 Pneumothorax, unspecified: Secondary | ICD-10-CM | POA: Diagnosis not present

## 2022-10-12 DIAGNOSIS — Z1152 Encounter for screening for COVID-19: Secondary | ICD-10-CM | POA: Diagnosis not present

## 2022-10-12 DIAGNOSIS — Z800 Family history of malignant neoplasm of digestive organs: Secondary | ICD-10-CM | POA: Diagnosis not present

## 2022-10-12 DIAGNOSIS — Z85828 Personal history of other malignant neoplasm of skin: Secondary | ICD-10-CM | POA: Diagnosis not present

## 2022-10-12 DIAGNOSIS — Z79899 Other long term (current) drug therapy: Secondary | ICD-10-CM | POA: Diagnosis not present

## 2022-10-12 DIAGNOSIS — Z7722 Contact with and (suspected) exposure to environmental tobacco smoke (acute) (chronic): Secondary | ICD-10-CM | POA: Diagnosis present

## 2022-10-12 DIAGNOSIS — J984 Other disorders of lung: Secondary | ICD-10-CM | POA: Diagnosis not present

## 2022-10-12 DIAGNOSIS — M47816 Spondylosis without myelopathy or radiculopathy, lumbar region: Secondary | ICD-10-CM | POA: Diagnosis not present

## 2022-10-12 DIAGNOSIS — Z87891 Personal history of nicotine dependence: Secondary | ICD-10-CM | POA: Diagnosis not present

## 2022-10-12 DIAGNOSIS — Z01812 Encounter for preprocedural laboratory examination: Secondary | ICD-10-CM | POA: Diagnosis not present

## 2022-10-12 DIAGNOSIS — I10 Essential (primary) hypertension: Secondary | ICD-10-CM | POA: Diagnosis not present

## 2022-10-12 DIAGNOSIS — I454 Nonspecific intraventricular block: Secondary | ICD-10-CM | POA: Diagnosis present

## 2022-10-12 DIAGNOSIS — Z91040 Latex allergy status: Secondary | ICD-10-CM | POA: Diagnosis not present

## 2022-10-12 DIAGNOSIS — Z823 Family history of stroke: Secondary | ICD-10-CM | POA: Diagnosis not present

## 2022-10-12 DIAGNOSIS — Z91030 Bee allergy status: Secondary | ICD-10-CM | POA: Diagnosis not present

## 2022-10-12 DIAGNOSIS — Z4682 Encounter for fitting and adjustment of non-vascular catheter: Secondary | ICD-10-CM | POA: Diagnosis not present

## 2022-10-12 DIAGNOSIS — Z8249 Family history of ischemic heart disease and other diseases of the circulatory system: Secondary | ICD-10-CM | POA: Diagnosis not present

## 2022-10-12 DIAGNOSIS — Z79890 Hormone replacement therapy (postmenopausal): Secondary | ICD-10-CM | POA: Diagnosis not present

## 2022-10-12 DIAGNOSIS — Z888 Allergy status to other drugs, medicaments and biological substances status: Secondary | ICD-10-CM | POA: Diagnosis not present

## 2022-10-12 DIAGNOSIS — Z8042 Family history of malignant neoplasm of prostate: Secondary | ICD-10-CM | POA: Diagnosis not present

## 2022-10-12 DIAGNOSIS — Z803 Family history of malignant neoplasm of breast: Secondary | ICD-10-CM | POA: Diagnosis not present

## 2022-10-12 LAB — COMPREHENSIVE METABOLIC PANEL
ALT: 19 U/L (ref 0–44)
AST: 23 U/L (ref 15–41)
Albumin: 3.8 g/dL (ref 3.5–5.0)
Alkaline Phosphatase: 59 U/L (ref 38–126)
Anion gap: 8 (ref 5–15)
BUN: 11 mg/dL (ref 8–23)
CO2: 28 mmol/L (ref 22–32)
Calcium: 8.9 mg/dL (ref 8.9–10.3)
Chloride: 101 mmol/L (ref 98–111)
Creatinine, Ser: 0.95 mg/dL (ref 0.44–1.00)
GFR, Estimated: >60 mL/min (ref >60)
Glucose, Bld: 87 mg/dL (ref 70–99)
Potassium: 3.8 mmol/L (ref 3.5–5.1)
Sodium: 137 mmol/L (ref 135–145)
Total Bilirubin: 0.5 mg/dL (ref 0.3–1.2)
Total Protein: 6.7 g/dL (ref 6.5–8.1)

## 2022-10-12 LAB — CBC
HCT: 45.5 % (ref 36.0–46.0)
Hemoglobin: 14.7 g/dL (ref 12.0–15.0)
MCH: 28.9 pg (ref 26.0–34.0)
MCHC: 32.3 g/dL (ref 30.0–36.0)
MCV: 89.4 fL (ref 80.0–100.0)
Platelets: 345 10*3/uL (ref 150–400)
RBC: 5.09 MIL/uL (ref 3.87–5.11)
RDW: 13.8 % (ref 11.5–15.5)
WBC: 9 10*3/uL (ref 4.0–10.5)
nRBC: 0 % (ref 0.0–0.2)

## 2022-10-12 LAB — URINALYSIS, ROUTINE W REFLEX MICROSCOPIC
Bilirubin Urine: NEGATIVE
Glucose, UA: NEGATIVE mg/dL
Hgb urine dipstick: NEGATIVE
Ketones, ur: NEGATIVE mg/dL
Leukocytes,Ua: NEGATIVE
Nitrite: NEGATIVE
Protein, ur: NEGATIVE mg/dL
Specific Gravity, Urine: 1.016 (ref 1.005–1.030)
pH: 7 (ref 5.0–8.0)

## 2022-10-12 LAB — SURGICAL PCR SCREEN
MRSA, PCR: NEGATIVE
Staphylococcus aureus: NEGATIVE

## 2022-10-12 LAB — PROTIME-INR
INR: 1 (ref 0.8–1.2)
Prothrombin Time: 12.7 seconds (ref 11.4–15.2)

## 2022-10-12 LAB — ABO/RH: ABO/RH(D): AB POS

## 2022-10-12 LAB — APTT: aPTT: 25 seconds (ref 24–36)

## 2022-10-12 NOTE — Progress Notes (Signed)
PCP - Dr. Jamesetta So Pulmonologist: Dr. Audie Box Oncologist: Dr. Malachy Mood Cardiologist - Denies  PPM/ICD - Denies  Chest x-ray - 10/12/22 EKG - 10/12/22 Stress Test - 09/25/22 ECHO - 02/18/18 Cardiac Cath - Denies  Sleep Study - Denies  Diabetes: Denies  Blood Thinner Instructions: N/A Aspirin Instructions: N/A  ERAS Protcol - No  COVID TEST- 10/12/22 in PAT   Anesthesia review: Yes, abnormal EKG  Patient denies shortness of breath, fever, cough and chest pain at PAT appointment   All instructions explained to the patient, with a verbal understanding of the material. Patient agrees to go over the instructions while at home for a better understanding. Patient also instructed to self quarantine after being tested for COVID-19. The opportunity to ask questions was provided.

## 2022-10-13 ENCOUNTER — Ambulatory Visit (HOSPITAL_COMMUNITY): Payer: BC Managed Care – PPO | Admitting: Anesthesiology

## 2022-10-13 ENCOUNTER — Ambulatory Visit (HOSPITAL_COMMUNITY): Payer: BC Managed Care – PPO | Admitting: Physician Assistant

## 2022-10-13 DIAGNOSIS — M47816 Spondylosis without myelopathy or radiculopathy, lumbar region: Secondary | ICD-10-CM | POA: Diagnosis not present

## 2022-10-13 LAB — SARS CORONAVIRUS 2 (TAT 6-24 HRS): SARS Coronavirus 2: NEGATIVE

## 2022-10-13 NOTE — Anesthesia Preprocedure Evaluation (Addendum)
Anesthesia Evaluation  Patient identified by MRN, date of birth, ID band Patient awake    Reviewed: Allergy & Precautions, NPO status , Patient's Chart, lab work & pertinent test results  Airway Mallampati: II  TM Distance: >3 FB Neck ROM: Full    Dental no notable dental hx. (+) Dental Advisory Given, Teeth Intact   Pulmonary neg pulmonary ROS   Pulmonary exam normal breath sounds clear to auscultation       Cardiovascular hypertension, Pt. on medications + angina  Normal cardiovascular exam Rhythm:Regular Rate:Normal  Stress 09/2022   Findings are consistent with infarction. The study is intermediate risk.   No ST deviation was noted.   LV perfusion is abnormal. There is no evidence of ischemia. There is evidence of infarction. Defect 1: There is a medium defect with mild reduction in uptake present in the apical to basal septal and apex location(s) that is fixed. There is abnormal wall motion in the defect area. Consistent with infarction.   Left ventricular function is normal. Nuclear stress EF: 57 %. The left ventricular ejection fraction is normal (55-65%). End diastolic cavity size is normal. End systolic cavity size is normal. No evidence of transient ischemic dilation (TID) noted.   Prior study available for comparison from 04/06/2017. There are changes compared to prior study.      Neuro/Psych  Headaches    GI/Hepatic negative GI ROS, Neg liver ROS,,,  Endo/Other  negative endocrine ROS    Renal/GU negative Renal ROS     Musculoskeletal negative musculoskeletal ROS (+)    Abdominal   Peds  Hematology negative hematology ROS (+)   Anesthesia Other Findings   Reproductive/Obstetrics                             Anesthesia Physical Anesthesia Plan  ASA: 2  Anesthesia Plan: General   Post-op Pain Management: Tylenol PO (pre-op)* and Gabapentin PO (pre-op)*   Induction:  Intravenous  PONV Risk Score and Plan: 4 or greater and Ondansetron, Dexamethasone, Treatment may vary due to age or medical condition and Midazolam  Airway Management Planned: Double Lumen EBT  Additional Equipment: Arterial line  Intra-op Plan:   Post-operative Plan: Extubation in OR  Informed Consent: I have reviewed the patients History and Physical, chart, labs and discussed the procedure including the risks, benefits and alternatives for the proposed anesthesia with the patient or authorized representative who has indicated his/her understanding and acceptance.     Dental advisory given  Plan Discussed with: CRNA  Anesthesia Plan Comments:         Anesthesia Quick Evaluation

## 2022-10-14 ENCOUNTER — Encounter (HOSPITAL_COMMUNITY)
Admission: RE | Disposition: A | Discharge status: Home / Self Care | Payer: Self-pay | Source: Home / Self Care | Attending: Thoracic Surgery (Cardiothoracic Vascular Surgery)

## 2022-10-14 ENCOUNTER — Ambulatory Visit (HOSPITAL_COMMUNITY): Payer: BC Managed Care – PPO | Admitting: Anesthesiology

## 2022-10-14 ENCOUNTER — Inpatient Hospital Stay (HOSPITAL_COMMUNITY)
Admission: RE | Admit: 2022-10-14 | Discharge: 2022-10-15 | DRG: 165 | Disposition: A | Discharge status: Home / Self Care | Payer: BC Managed Care – PPO | Attending: Thoracic Surgery (Cardiothoracic Vascular Surgery) | Admitting: Thoracic Surgery (Cardiothoracic Vascular Surgery)

## 2022-10-14 ENCOUNTER — Ambulatory Visit (HOSPITAL_COMMUNITY): Payer: BC Managed Care – PPO

## 2022-10-14 ENCOUNTER — Inpatient Hospital Stay (HOSPITAL_COMMUNITY): Payer: BC Managed Care – PPO

## 2022-10-14 ENCOUNTER — Encounter (HOSPITAL_COMMUNITY): Payer: Self-pay | Admitting: Pulmonary Disease

## 2022-10-14 DIAGNOSIS — Z01812 Encounter for preprocedural laboratory examination: Secondary | ICD-10-CM | POA: Diagnosis not present

## 2022-10-14 DIAGNOSIS — Z91040 Latex allergy status: Secondary | ICD-10-CM | POA: Diagnosis not present

## 2022-10-14 DIAGNOSIS — Z800 Family history of malignant neoplasm of digestive organs: Secondary | ICD-10-CM

## 2022-10-14 DIAGNOSIS — Z87891 Personal history of nicotine dependence: Secondary | ICD-10-CM | POA: Diagnosis not present

## 2022-10-14 DIAGNOSIS — Z1152 Encounter for screening for COVID-19: Secondary | ICD-10-CM

## 2022-10-14 DIAGNOSIS — Z79899 Other long term (current) drug therapy: Secondary | ICD-10-CM | POA: Diagnosis not present

## 2022-10-14 DIAGNOSIS — Z85828 Personal history of other malignant neoplasm of skin: Secondary | ICD-10-CM

## 2022-10-14 DIAGNOSIS — Z888 Allergy status to other drugs, medicaments and biological substances status: Secondary | ICD-10-CM | POA: Diagnosis not present

## 2022-10-14 DIAGNOSIS — Z825 Family history of asthma and other chronic lower respiratory diseases: Secondary | ICD-10-CM

## 2022-10-14 DIAGNOSIS — Z79890 Hormone replacement therapy (postmenopausal): Secondary | ICD-10-CM | POA: Diagnosis not present

## 2022-10-14 DIAGNOSIS — I454 Nonspecific intraventricular block: Secondary | ICD-10-CM | POA: Diagnosis present

## 2022-10-14 DIAGNOSIS — Z8042 Family history of malignant neoplasm of prostate: Secondary | ICD-10-CM

## 2022-10-14 DIAGNOSIS — R911 Solitary pulmonary nodule: Secondary | ICD-10-CM | POA: Diagnosis not present

## 2022-10-14 DIAGNOSIS — Z823 Family history of stroke: Secondary | ICD-10-CM

## 2022-10-14 DIAGNOSIS — C3412 Malignant neoplasm of upper lobe, left bronchus or lung: Principal | ICD-10-CM | POA: Diagnosis present

## 2022-10-14 DIAGNOSIS — Z91030 Bee allergy status: Secondary | ICD-10-CM | POA: Diagnosis not present

## 2022-10-14 DIAGNOSIS — Z8249 Family history of ischemic heart disease and other diseases of the circulatory system: Secondary | ICD-10-CM | POA: Diagnosis not present

## 2022-10-14 DIAGNOSIS — I10 Essential (primary) hypertension: Secondary | ICD-10-CM | POA: Diagnosis present

## 2022-10-14 DIAGNOSIS — Z803 Family history of malignant neoplasm of breast: Secondary | ICD-10-CM

## 2022-10-14 DIAGNOSIS — Z7722 Contact with and (suspected) exposure to environmental tobacco smoke (acute) (chronic): Secondary | ICD-10-CM | POA: Diagnosis present

## 2022-10-14 HISTORY — PX: FIDUCIAL MARKER PLACEMENT: SHX6858

## 2022-10-14 HISTORY — PX: BRONCHIAL BIOPSY: SHX5109

## 2022-10-14 HISTORY — PX: NODE DISSECTION: SHX5269

## 2022-10-14 HISTORY — PX: INTERCOSTAL NERVE BLOCK: SHX5021

## 2022-10-14 LAB — PREPARE RBC (CROSSMATCH)

## 2022-10-14 LAB — BPAM RBC
Unit Type and Rh: 6200
Unit Type and Rh: 6200

## 2022-10-14 LAB — TYPE AND SCREEN

## 2022-10-14 SURGERY — BRONCHOSCOPY, WITH BIOPSY USING ELECTROMAGNETIC NAVIGATION
Anesthesia: General

## 2022-10-14 SURGERY — WEDGE RESECTION, LUNG, ROBOT-ASSISTED, THORACOSCOPIC
Anesthesia: General | Site: Chest | Laterality: Left

## 2022-10-14 MED ORDER — EPHEDRINE 5 MG/ML INJ
INTRAVENOUS | Status: AC
  Filled 2022-10-14: qty 5

## 2022-10-14 MED ORDER — DEXAMETHASONE SODIUM PHOSPHATE 10 MG/ML IJ SOLN
INTRAMUSCULAR | Status: DC | PRN
  Administered 2022-10-14: 5 mg via INTRAVENOUS

## 2022-10-14 MED ORDER — HYDROMORPHONE HCL 1 MG/ML IJ SOLN
INTRAMUSCULAR | Status: AC
  Filled 2022-10-14: qty 1

## 2022-10-14 MED ORDER — SENNOSIDES-DOCUSATE SODIUM 8.6-50 MG PO TABS: 1.0000 | ORAL_TABLET | Freq: Every day | ORAL | Status: DC

## 2022-10-14 MED ORDER — MIDAZOLAM HCL 2 MG/2ML IJ SOLN
INTRAMUSCULAR | Status: DC | PRN
  Administered 2022-10-14: 2 mg via INTRAVENOUS

## 2022-10-14 MED ORDER — ONDANSETRON HCL 4 MG/2ML IJ SOLN
INTRAMUSCULAR | Status: DC | PRN
  Administered 2022-10-14: 4 mg via INTRAVENOUS

## 2022-10-14 MED ORDER — PHENYLEPHRINE HCL-NACL 20-0.9 MG/250ML-% IV SOLN
INTRAVENOUS | Status: DC | PRN
  Administered 2022-10-14: 10 ug/min via INTRAVENOUS

## 2022-10-14 MED ORDER — PROPOFOL 10 MG/ML IV BOLUS
INTRAVENOUS | Status: AC
  Filled 2022-10-14: qty 20

## 2022-10-14 MED ORDER — PROPOFOL 500 MG/50ML IV EMUL
INTRAVENOUS | Status: DC | PRN
  Administered 2022-10-14: 50 ug/kg/min via INTRAVENOUS

## 2022-10-14 MED ORDER — MIDAZOLAM HCL 2 MG/2ML IJ SOLN
INTRAMUSCULAR | Status: AC
  Filled 2022-10-14: qty 2

## 2022-10-14 MED ORDER — FENTANYL CITRATE (PF) 250 MCG/5ML IJ SOLN
INTRAMUSCULAR | Status: AC
  Filled 2022-10-14: qty 5

## 2022-10-14 MED ORDER — PHENYLEPHRINE 80 MCG/ML (10ML) SYRINGE FOR IV PUSH (FOR BLOOD PRESSURE SUPPORT)
PREFILLED_SYRINGE | INTRAVENOUS | Status: DC | PRN
  Administered 2022-10-14: 80 ug via INTRAVENOUS
  Administered 2022-10-14: 160 ug via INTRAVENOUS
  Administered 2022-10-14 (×2): 80 ug via INTRAVENOUS
  Administered 2022-10-14 (×2): 40 ug via INTRAVENOUS

## 2022-10-14 MED ORDER — BUPIVACAINE LIPOSOME 1.3 % IJ SUSP
INTRAMUSCULAR | Status: AC
  Filled 2022-10-14: qty 20

## 2022-10-14 MED ORDER — DEXAMETHASONE SODIUM PHOSPHATE 10 MG/ML IJ SOLN
INTRAMUSCULAR | Status: AC
  Filled 2022-10-14: qty 1

## 2022-10-14 MED ORDER — MEPERIDINE HCL 25 MG/ML IJ SOLN: 6.2500 mg | INTRAMUSCULAR | Status: DC | PRN

## 2022-10-14 MED ORDER — PHENYLEPHRINE HCL-NACL 20-0.9 MG/250ML-% IV SOLN
INTRAVENOUS | Status: AC
  Filled 2022-10-14: qty 250

## 2022-10-14 MED ORDER — CEFAZOLIN SODIUM-DEXTROSE 2-4 GM/100ML-% IV SOLN
2.0000 g | Freq: Three times a day (TID) | INTRAVENOUS | Status: AC
  Administered 2022-10-14 (×2): 2 g via INTRAVENOUS
  Filled 2022-10-14 (×2): qty 100

## 2022-10-14 MED ORDER — PHENYLEPHRINE HCL-NACL 20-0.9 MG/250ML-% IV SOLN: INTRAVENOUS | Status: DC | PRN

## 2022-10-14 MED ORDER — 0.9 % SODIUM CHLORIDE (POUR BTL) OPTIME
TOPICAL | Status: DC | PRN
  Administered 2022-10-14: 2000 mL

## 2022-10-14 MED ORDER — KETOROLAC TROMETHAMINE 15 MG/ML IJ SOLN
15.0000 mg | Freq: Four times a day (QID) | INTRAMUSCULAR | Status: DC
  Administered 2022-10-14 – 2022-10-15 (×4): 15 mg via INTRAVENOUS
  Filled 2022-10-14 (×4): qty 1

## 2022-10-14 MED ORDER — METHYLENE BLUE 1 % INJ SOLN
INTRAVENOUS | Status: DC | PRN
  Administered 2022-10-14: 1 mL

## 2022-10-14 MED ORDER — FENTANYL CITRATE (PF) 250 MCG/5ML IJ SOLN
INTRAMUSCULAR | Status: DC | PRN
  Administered 2022-10-14 (×3): 50 ug via INTRAVENOUS
  Administered 2022-10-14: 100 ug via INTRAVENOUS
  Administered 2022-10-14 (×2): 50 ug via INTRAVENOUS

## 2022-10-14 MED ORDER — PHENYLEPHRINE 80 MCG/ML (10ML) SYRINGE FOR IV PUSH (FOR BLOOD PRESSURE SUPPORT)
PREFILLED_SYRINGE | INTRAVENOUS | Status: AC
  Filled 2022-10-14: qty 10

## 2022-10-14 MED ORDER — ACETAMINOPHEN 500 MG PO TABS
1000.0000 mg | ORAL_TABLET | Freq: Once | ORAL | Status: AC
  Administered 2022-10-14: 1000 mg via ORAL
  Filled 2022-10-14: qty 2

## 2022-10-14 MED ORDER — AMISULPRIDE (ANTIEMETIC) 5 MG/2ML IV SOLN: 10.0000 mg | Freq: Once | INTRAVENOUS | Status: DC | PRN

## 2022-10-14 MED ORDER — PROPOFOL 10 MG/ML IV BOLUS
INTRAVENOUS | Status: DC | PRN
  Administered 2022-10-14: 120 mg via INTRAVENOUS
  Administered 2022-10-14: 30 mg via INTRAVENOUS
  Administered 2022-10-14: 50 mg via INTRAVENOUS

## 2022-10-14 MED ORDER — LEVOTHYROXINE SODIUM 50 MCG PO TABS
50.0000 ug | ORAL_TABLET | Freq: Every day | ORAL | Status: DC
  Administered 2022-10-15: 50 ug via ORAL
  Filled 2022-10-14: qty 1

## 2022-10-14 MED ORDER — ROCURONIUM BROMIDE 10 MG/ML (PF) SYRINGE
PREFILLED_SYRINGE | INTRAVENOUS | Status: AC
  Filled 2022-10-14: qty 10

## 2022-10-14 MED ORDER — PANTOPRAZOLE SODIUM 40 MG PO TBEC
40.0000 mg | DELAYED_RELEASE_TABLET | Freq: Every day | ORAL | Status: DC
  Administered 2022-10-15: 40 mg via ORAL
  Filled 2022-10-14: qty 1

## 2022-10-14 MED ORDER — SODIUM CHLORIDE FLUSH 0.9 % IV SOLN
INTRAVENOUS | Status: DC | PRN
  Administered 2022-10-14: 100 mL

## 2022-10-14 MED ORDER — ONDANSETRON HCL 4 MG/2ML IJ SOLN
INTRAMUSCULAR | Status: AC
  Filled 2022-10-14: qty 2

## 2022-10-14 MED ORDER — ENOXAPARIN SODIUM 40 MG/0.4ML IJ SOSY
40.0000 mg | PREFILLED_SYRINGE | INTRAMUSCULAR | Status: DC
  Administered 2022-10-14: 40 mg via SUBCUTANEOUS
  Filled 2022-10-14: qty 0.4

## 2022-10-14 MED ORDER — LACTATED RINGERS IV SOLN: INTRAVENOUS | Status: DC | PRN

## 2022-10-14 MED ORDER — BISACODYL 5 MG PO TBEC
10.0000 mg | DELAYED_RELEASE_TABLET | Freq: Every day | ORAL | Status: DC
  Administered 2022-10-15: 10 mg via ORAL
  Filled 2022-10-14 (×2): qty 2

## 2022-10-14 MED ORDER — LIDOCAINE 2% (20 MG/ML) 5 ML SYRINGE
INTRAMUSCULAR | Status: DC | PRN
  Administered 2022-10-14: 100 mg via INTRAVENOUS

## 2022-10-14 MED ORDER — TRAMADOL HCL 50 MG PO TABS: 50.0000 mg | ORAL_TABLET | Freq: Four times a day (QID) | ORAL | Status: DC | PRN

## 2022-10-14 MED ORDER — LORATADINE 10 MG PO TABS
10.0000 mg | ORAL_TABLET | Freq: Every day | ORAL | Status: DC
  Filled 2022-10-14: qty 1

## 2022-10-14 MED ORDER — MORPHINE SULFATE (PF) 2 MG/ML IV SOLN: 1.0000 mg | INTRAVENOUS | Status: DC | PRN

## 2022-10-14 MED ORDER — OXYCODONE HCL 5 MG PO TABS
5.0000 mg | ORAL_TABLET | ORAL | Status: DC | PRN
  Administered 2022-10-14: 5 mg via ORAL
  Administered 2022-10-15: 10 mg via ORAL
  Administered 2022-10-15: 5 mg via ORAL
  Filled 2022-10-14: qty 1
  Filled 2022-10-14: qty 2
  Filled 2022-10-14: qty 1

## 2022-10-14 MED ORDER — ONDANSETRON HCL 4 MG/2ML IJ SOLN: 4.0000 mg | Freq: Four times a day (QID) | INTRAMUSCULAR | Status: DC | PRN

## 2022-10-14 MED ORDER — CHLORHEXIDINE GLUCONATE 0.12 % MT SOLN
15.0000 mL | OROMUCOSAL | Status: AC
  Administered 2022-10-14: 15 mL via OROMUCOSAL
  Filled 2022-10-14 (×2): qty 15

## 2022-10-14 MED ORDER — LIDOCAINE 2% (20 MG/ML) 5 ML SYRINGE
INTRAMUSCULAR | Status: AC
  Filled 2022-10-14: qty 5

## 2022-10-14 MED ORDER — BUPIVACAINE HCL (PF) 0.5 % IJ SOLN
INTRAMUSCULAR | Status: AC
  Filled 2022-10-14: qty 30

## 2022-10-14 MED ORDER — DIPHENHYDRAMINE HCL 25 MG PO CAPS
25.0000 mg | ORAL_CAPSULE | Freq: Four times a day (QID) | ORAL | Status: DC | PRN
  Administered 2022-10-14 – 2022-10-15 (×2): 25 mg via ORAL
  Filled 2022-10-14 (×2): qty 1

## 2022-10-14 MED ORDER — ROCURONIUM BROMIDE 10 MG/ML (PF) SYRINGE
PREFILLED_SYRINGE | INTRAVENOUS | Status: DC | PRN
  Administered 2022-10-14: 30 mg via INTRAVENOUS
  Administered 2022-10-14: 40 mg via INTRAVENOUS
  Administered 2022-10-14: 70 mg via INTRAVENOUS

## 2022-10-14 MED ORDER — HYDROMORPHONE HCL 1 MG/ML IJ SOLN
0.2500 mg | INTRAMUSCULAR | Status: DC | PRN
  Administered 2022-10-14 (×4): 0.5 mg via INTRAVENOUS

## 2022-10-14 MED ORDER — CEFAZOLIN SODIUM-DEXTROSE 2-4 GM/100ML-% IV SOLN
2.0000 g | INTRAVENOUS | Status: AC
  Administered 2022-10-14: 2 g via INTRAVENOUS
  Filled 2022-10-14 (×2): qty 100

## 2022-10-14 MED ORDER — ACETAMINOPHEN 500 MG PO TABS
1000.0000 mg | ORAL_TABLET | Freq: Four times a day (QID) | ORAL | Status: DC
  Administered 2022-10-14 – 2022-10-15 (×4): 1000 mg via ORAL
  Filled 2022-10-14 (×4): qty 2

## 2022-10-14 MED ORDER — VERAPAMIL HCL ER 180 MG PO TBCR
180.0000 mg | EXTENDED_RELEASE_TABLET | Freq: Every day | ORAL | Status: DC
  Administered 2022-10-14: 180 mg via ORAL
  Filled 2022-10-14: qty 1

## 2022-10-14 MED ORDER — GABAPENTIN 300 MG PO CAPS
300.0000 mg | ORAL_CAPSULE | Freq: Once | ORAL | Status: AC
  Administered 2022-10-14: 300 mg via ORAL
  Filled 2022-10-14 (×2): qty 1

## 2022-10-14 MED ORDER — SUGAMMADEX SODIUM 200 MG/2ML IV SOLN
INTRAVENOUS | Status: DC | PRN
  Administered 2022-10-14: 200 mg via INTRAVENOUS

## 2022-10-14 MED ORDER — ACETAMINOPHEN 160 MG/5ML PO SOLN: 1000.0000 mg | Freq: Four times a day (QID) | ORAL | Status: DC

## 2022-10-14 MED ORDER — HYDROCHLOROTHIAZIDE 25 MG PO TABS: 25.0000 mg | ORAL_TABLET | Freq: Every day | ORAL | Status: DC

## 2022-10-14 MED ORDER — PROMETHAZINE HCL 25 MG/ML IJ SOLN: 6.2500 mg | INTRAMUSCULAR | Status: DC | PRN

## 2022-10-14 SURGICAL SUPPLY — 90 items
ADH SKN CLS APL DERMABOND .7 (GAUZE/BANDAGES/DRESSINGS) ×1
APL PRP STRL LF DISP 70% ISPRP (MISCELLANEOUS) ×1
BAG SPEC RTRVL C125 8X14 (MISCELLANEOUS) ×1
BAG SPEC RTRVL C1550 15 (MISCELLANEOUS) ×1
BLADE CLIPPER SURG (BLADE) ×1 IMPLANT
CANISTER SUCT 3000ML PPV (MISCELLANEOUS) ×2 IMPLANT
CANNULA REDUCER 12-8 DVNC XI (CANNULA) ×2 IMPLANT
CATH TROCAR 20FR (CATHETERS) IMPLANT
CHLORAPREP W/TINT 26 (MISCELLANEOUS) ×1 IMPLANT
CLIP TI MEDIUM 6 (CLIP) IMPLANT
CNTNR URN SCR LID CUP LEK RST (MISCELLANEOUS) ×5 IMPLANT
CONN ST 1/4X3/8  BEN (MISCELLANEOUS)
CONN ST 1/4X3/8 BEN (MISCELLANEOUS) IMPLANT
CONT SPEC 4OZ STRL OR WHT (MISCELLANEOUS) ×5
DEFOGGER SCOPE WARMER CLEARIFY (MISCELLANEOUS) ×1 IMPLANT
DERMABOND ADVANCED .7 DNX12 (GAUZE/BANDAGES/DRESSINGS) ×1 IMPLANT
DRAIN CHANNEL 28F RND 3/8 FF (WOUND CARE) IMPLANT
DRAIN CHANNEL 32F RND 10.7 FF (WOUND CARE) IMPLANT
DRAPE ARM DVNC X/XI (DISPOSABLE) ×4 IMPLANT
DRAPE COLUMN DVNC XI (DISPOSABLE) ×1 IMPLANT
DRAPE CV SPLIT W-CLR ANES SCRN (DRAPES) ×1 IMPLANT
DRAPE HALF SHEET 40X57 (DRAPES) ×1 IMPLANT
DRAPE ORTHO SPLIT 77X108 STRL (DRAPES) ×1
DRAPE SURG ORHT 6 SPLT 77X108 (DRAPES) ×1 IMPLANT
ELECT BLADE 6.5 EXT (BLADE) IMPLANT
ELECT REM PT RETURN 9FT ADLT (ELECTROSURGICAL) ×1
ELECTRODE REM PT RTRN 9FT ADLT (ELECTROSURGICAL) ×1 IMPLANT
FORCEPS BPLR LNG DVNC XI (INSTRUMENTS) IMPLANT
FORCEPS CADIERE DVNC XI (FORCEP) IMPLANT
GAUZE KITTNER 4X5 RF (MISCELLANEOUS) ×1 IMPLANT
GAUZE SPONGE 4X4 12PLY STRL (GAUZE/BANDAGES/DRESSINGS) ×1 IMPLANT
GLOVE BIO SURGEON STRL SZ7.5 (GLOVE) ×2 IMPLANT
GLOVE SURG SS PI 8.0 STRL IVOR (GLOVE) ×1 IMPLANT
GOWN STRL REUS W/ TWL LRG LVL3 (GOWN DISPOSABLE) ×2 IMPLANT
GOWN STRL REUS W/ TWL XL LVL3 (GOWN DISPOSABLE) ×2 IMPLANT
GOWN STRL REUS W/TWL 2XL LVL3 (GOWN DISPOSABLE) ×1 IMPLANT
GOWN STRL REUS W/TWL LRG LVL3 (GOWN DISPOSABLE) ×2
GOWN STRL REUS W/TWL XL LVL3 (GOWN DISPOSABLE) ×2
GRASPER TIP-UP FEN DVNC XI (INSTRUMENTS) IMPLANT
HEMOSTAT SURGICEL 2X14 (HEMOSTASIS) ×3 IMPLANT
KIT BASIN OR (CUSTOM PROCEDURE TRAY) ×1 IMPLANT
KIT TURNOVER KIT B (KITS) ×1 IMPLANT
NDL 22X1.5 STRL (OR ONLY) (MISCELLANEOUS) ×1 IMPLANT
NEEDLE 22X1.5 STRL (OR ONLY) (MISCELLANEOUS) ×1 IMPLANT
NS IRRIG 1000ML POUR BTL (IV SOLUTION) ×3 IMPLANT
PACK CHEST (CUSTOM PROCEDURE TRAY) ×1 IMPLANT
PAD ARMBOARD 7.5X6 YLW CONV (MISCELLANEOUS) ×5 IMPLANT
PORT ACCESS TROCAR AIRSEAL 12 (TROCAR) ×1 IMPLANT
RELOAD STAPLE 45 2.5 WHT DVNC (STAPLE) IMPLANT
RELOAD STAPLE 45 3.5 BLU DVNC (STAPLE) IMPLANT
RELOAD STAPLE 45 4.3 GRN DVNC (STAPLE) IMPLANT
RELOAD STAPLER 2.5X45 WHT DVNC (STAPLE) ×5 IMPLANT
RELOAD STAPLER 3.5X45 BLU DVNC (STAPLE) ×6 IMPLANT
RELOAD STAPLER 4.3X45 GRN DVNC (STAPLE) ×1 IMPLANT
SEAL CANN UNIV 5-8 DVNC XI (MISCELLANEOUS) ×2 IMPLANT
SET TRI-LUMEN FLTR TB AIRSEAL (TUBING) ×1 IMPLANT
SOL ELECTROSURG ANTI STICK (MISCELLANEOUS) ×1
SOLUTION ELECTROSURG ANTI STCK (MISCELLANEOUS) ×1 IMPLANT
STAPLER 45 SUREFORM CVD DVNC (STAPLE) IMPLANT
STAPLER CANNULA SEAL DVNC XI (STAPLE) ×2 IMPLANT
STAPLER RELOAD 2.5X45 WHT DVNC (STAPLE) ×5
STAPLER RELOAD 3.5X45 BLU DVNC (STAPLE) ×6
STAPLER RELOAD 4.3X45 GRN DVNC (STAPLE) ×1
STOPCOCK 4 WAY LG BORE MALE ST (IV SETS) ×1 IMPLANT
SUT PDS AB 1 CTX 36 (SUTURE) IMPLANT
SUT PROLENE 4 0 RB 1 (SUTURE)
SUT PROLENE 4-0 RB1 .5 CRCL 36 (SUTURE) IMPLANT
SUT SILK  1 MH (SUTURE) ×1
SUT SILK 1 MH (SUTURE) ×1 IMPLANT
SUT SILK 2 0 SH (SUTURE) IMPLANT
SUT SILK 2 0SH CR/8 30 (SUTURE) IMPLANT
SUT VIC AB 1 CTX 36 (SUTURE)
SUT VIC AB 1 CTX36XBRD ANBCTR (SUTURE) IMPLANT
SUT VIC AB 2-0 CT1 27 (SUTURE) ×1
SUT VIC AB 2-0 CT1 TAPERPNT 27 (SUTURE) ×1 IMPLANT
SUT VIC AB 3-0 SH 27 (SUTURE) ×3
SUT VIC AB 3-0 SH 27X BRD (SUTURE) ×3 IMPLANT
SUT VICRYL 0 TIES 12 18 (SUTURE) ×1 IMPLANT
SUT VICRYL 0 UR6 27IN ABS (SUTURE) ×2 IMPLANT
SYR 10ML LL (SYRINGE) ×1 IMPLANT
SYR 20ML LL LF (SYRINGE) ×1 IMPLANT
SYR 50ML LL SCALE MARK (SYRINGE) ×1 IMPLANT
SYSTEM RETRIEVAL ANCHOR 15 (MISCELLANEOUS) IMPLANT
SYSTEM RETRIEVAL ANCHOR 8 (MISCELLANEOUS) IMPLANT
SYSTEM SAHARA CHEST DRAIN ATS (WOUND CARE) ×1 IMPLANT
TAPE CLOTH 4X10 WHT NS (GAUZE/BANDAGES/DRESSINGS) ×1 IMPLANT
TOWEL GREEN STERILE (TOWEL DISPOSABLE) ×1 IMPLANT
TRAY FOLEY MTR SLVR 16FR STAT (SET/KITS/TRAYS/PACK) ×1 IMPLANT
TUBING EXTENTION W/L.L. (IV SETS) ×1 IMPLANT
WATER STERILE IRR 1000ML POUR (IV SOLUTION) ×1 IMPLANT

## 2022-10-14 NOTE — Op Note (Signed)
      [address]       [place] [address]             [phone]        10/14/2022  Patient:  Laura Smith Pre-Op Dx: Left upper lobe pulmonary nodule   Post-op Dx:  Left upper lobe NSCLC Procedure: - Robotic assisted left video thoracoscopy - Left upper lobe wedge resection - left upper lobectomy - Mediastinal lymph node sampling - Intercostal nerve block  Surgeon and Role:      * Elster Corbello, Eliezer Lofts, MD - Primary  Assistant: D.Zimmerman, PA-C  An experienced assistant was required given the complexity of this surgery and the standard of surgical care. The assistant was needed for exposure, dissection, suctioning, retraction of delicate tissues and sutures, instrument exchange and for overall help during this procedure.    Anesthesia  general EBL:  100 ml Blood Administration: none Specimen:  left upper lobe wedge, left upper lobectomy, hilar and mediastinal nodes  Drains: 28 F argyle chest tube in left chest Counts: correct   Indications: 62yo female with 1.2cm left upper lobe GGO with interval enlargement. She is a life long non-smoker. We had a very long discussion about potential ways to treat this. I recommended that she undergo a navigational bronchoscopy with biopsy marking followed by a left robotic assisted thoracoscopy with wedge resection and possible lobectomy. She is agreeable to proceed.   Findings: Pathology showed NSCLC.  Operative Technique: After the risks, benefits and alternatives were thoroughly discussed, the patient was brought to the operative theatre.  Anesthesia was induced, and the patient was then placed in a lateral decubitus position and was prepped and draped in normal sterile fashion.  An appropriate surgical pause was performed, and pre-operative antibiotics were dosed accordingly.  We began by placing our 4 robotic ports in the the 7th intercostal space targeting the hilum of the lung.  A 12mm assistant port was  placed in the 9th intercostal space in the anterior axillary line.  The robot was then docked and all instruments were passed under direct visualization.    The firefly was activated, and the area of concern was evident.  This corresponded to an area of pleural puckering.  A wedge resection was performed, and the specimen was removed with an endocatch bag.  The lung was then retracted superiorly, and the inferior pulmonary ligament was divided.  The hilum was mobilized anteriorly and posteriorly.  We identified the upper lobe pulmonary vein, and after careful isolation, it was divided with a vascular stapler.  We next moved to the pulmonary artery.  The artery was then divided with a vascular load stapler.  The bronchus to the upper lobe was then isolated.  After a test clamp, with good ventilation of the remaining lung, the bronchus was then divided.  The fissure was completed, and the specimen was passed into an endocatch bag.  It was removed from the anterior access site.    Lymph nodes were then sampled at hilum and mediastinum.  The chest was irrigated, and an air leak test was performed.  An intercostal nerve block was performed under direct visualization.  A 28 F chest tube was then placed, and we watch the remaining lobes re-expand.  The skin and soft tissue were closed with absorbable suture    The patient tolerated the procedure without any immediate complications, and was transferred to the PACU in stable condition.  Lavalle Skoda Keane Scrape

## 2022-10-14 NOTE — Brief Op Note (Signed)
10/14/2022  11:12 AM  PATIENT:  Laura Smith  62 y.o. female  PRE-OPERATIVE DIAGNOSIS:  LEFT UPPER PULMONARY NODULE  POST-OPERATIVE DIAGNOSIS:  LEFT UPPER PULMONARY NODULE  PROCEDURE:  XI ROBOTIC ASSISTED THORACOSCOPY,LEFT UPPER LOBE WEDGE RESECTION, LUL, LYMPH NODE DISSECTION, and INTERCOSTAL NERVE BLOCK   SURGEON:  Surgeon(s) and Role:    Lightfoot, Eliezer Lofts, MD - Primary  PHYSICIAN ASSISTANT: Doree Fudge PA-C  ANESTHESIA:   general  EBL:  Per anesthesia record  BLOOD ADMINISTERED:none  DRAINS:  Blake drain placed in the left pleural space    LOCAL MEDICATIONS USED:  OTHER Exparel  SPECIMEN:  Source of Specimen:  Wedge or LUL, LUL, multiple lymph nodes  DISPOSITION OF SPECIMEN:   Pathology. Frozen section on wedge LUL showed adenocarcinoma  COUNTS CORRECT:  YES  DICTATION: .Dragon Dictation  PLAN OF CARE: Admit to inpatient   PATIENT DISPOSITION:  PACU - hemodynamically stable.   Delay start of Pharmacological VTE agent (>24hrs) due to surgical blood loss or risk of bleeding: no

## 2022-10-14 NOTE — Anesthesia Procedure Notes (Signed)
Procedure Name: Intubation Date/Time: 10/14/2022 8:28 AM  Performed by: Lind Guest, RNPre-anesthesia Checklist: Patient identified, Emergency Drugs available, Suction available and Patient being monitored Patient Re-evaluated:Patient Re-evaluated prior to induction Oxygen Delivery Method: Circle system utilized Preoxygenation: Pre-oxygenation with 100% oxygen Laryngoscope Size: Glidescope and 3 Grade View: Grade I Endobronchial tube: Left, Double lumen EBT, EBT position confirmed by auscultation and EBT position confirmed by fiberoptic bronchoscope and 37 Fr Number of attempts: 1 Airway Equipment and Method: Video-laryngoscopy Placement Confirmation: ETT inserted through vocal cords under direct vision, positive ETCO2, breath sounds checked- equal and bilateral and CO2 detector Secured at: 29 cm Tube secured with: Tape Dental Injury: Teeth and Oropharynx as per pre-operative assessment

## 2022-10-14 NOTE — Anesthesia Preprocedure Evaluation (Deleted)
Anesthesia Evaluation  Patient identified by MRN, date of birth, ID band Patient awake    Reviewed: Allergy & Precautions, NPO status , Patient's Chart, lab work & pertinent test results  Airway Mallampati: II  TM Distance: >3 FB Neck ROM: Full    Dental no notable dental hx. (+) Dental Advisory Given   Pulmonary neg pulmonary ROS   Pulmonary exam normal breath sounds clear to auscultation       Cardiovascular hypertension, Pt. on medications + angina  Normal cardiovascular exam Rhythm:Regular Rate:Normal  Stress 09/2022   Findings are consistent with infarction. The study is intermediate risk.   No ST deviation was noted.   LV perfusion is abnormal. There is no evidence of ischemia. There is evidence of infarction. Defect 1: There is a medium defect with mild reduction in uptake present in the apical to basal septal and apex location(s) that is fixed. There is abnormal wall motion in the defect area. Consistent with infarction.   Left ventricular function is normal. Nuclear stress EF: 57 %. The left ventricular ejection fraction is normal (55-65%). End diastolic cavity size is normal. End systolic cavity size is normal. No evidence of transient ischemic dilation (TID) noted.   Prior study available for comparison from 04/06/2017. There are changes compared to prior study.      Neuro/Psych  Headaches    GI/Hepatic negative GI ROS, Neg liver ROS,,,  Endo/Other  negative endocrine ROS    Renal/GU negative Renal ROS     Musculoskeletal negative musculoskeletal ROS (+)    Abdominal  (+) + obese  Peds  Hematology negative hematology ROS (+)   Anesthesia Other Findings   Reproductive/Obstetrics                              Anesthesia Physical Anesthesia Plan  ASA: 3  Anesthesia Plan: General   Post-op Pain Management: Tylenol PO (pre-op)* and Gabapentin PO (pre-op)*   Induction:  Intravenous  PONV Risk Score and Plan: 4 or greater and Ondansetron, Dexamethasone, Treatment may vary due to age or medical condition and Midazolam  Airway Management Planned: Double Lumen EBT  Additional Equipment: Arterial line  Intra-op Plan:   Post-operative Plan: Extubation in OR  Informed Consent: I have reviewed the patients History and Physical, chart, labs and discussed the procedure including the risks, benefits and alternatives for the proposed anesthesia with the patient or authorized representative who has indicated his/her understanding and acceptance.     Dental advisory given  Plan Discussed with: CRNA  Anesthesia Plan Comments:          Anesthesia Quick Evaluation

## 2022-10-14 NOTE — H&P (Signed)
Expand All Collapse All    Synopsis: Referred in February 2024 for pulmonary nodule by Panosh, Neta Mends, MD   Subjective:    PATIENT ID: Laura Smith GENDER: female DOB: 05-24-1961, MRN: [id number]       Chief Complaint  Patient presents with   Consult      Lung nodule      This is a 62 year old female, past medical history of allergies, endometriosis, family history of breast cancer colon cancer prostate cancer, she has a history of basal cell carcinoma of the skin.  She had a CT scan dating back from 2021 that showed a small groundglass lesion in the periphery of the left upper lobe on the medial side of the pleura.  Patient had a repeat CT scan in follow-up that has shown some slight enlargement of this groundglass opacity.  She was referred here for consideration of bronchoscopy and biopsy.  She also has a small right upper lobe 4 mm groundglass opacity.  OV: here today for planned outpatient bronchoscopy dye marking and lobectomy            Past Medical History:  Diagnosis Date   Allergy     Breast cyst     Endometriosis      surgery laser 2001   Family history of breast cancer     Family history of colon cancer     Family history of prostate cancer     Hemorrhoids     Hx of basal cell carcinoma     Hypertension     Migraine     Thyroid disease     Urinary tract infection             Family History  Problem Relation Age of Onset   Breast cancer Mother          Age 14   Colon cancer Mother 74   Breast cancer Cousin 73        paternal first cousin   Heart disease Father 43        Heart attack   Hypertension Paternal Grandmother     Heart disease Paternal Grandmother     Stroke Paternal Grandmother     Cancer Paternal Grandmother          Lung   Cancer Paternal Grandfather          Prostate   Stroke Maternal Grandfather     COPD Maternal Uncle     Liver cancer Paternal Uncle     Congestive Heart Failure Maternal Grandmother     Hypertension Paternal  Aunt     Colon polyps Neg Hx     Stomach cancer Neg Hx     Rectal cancer Neg Hx     Esophageal cancer Neg Hx             Past Surgical History:  Procedure Laterality Date   APPENDECTOMY   1992    AT C/S   BACK SURGERY   06/06/2020    L3-4   CESAREAN SECTION   1992    APPENDECTOMY SAME TIME   COLONOSCOPY   2015   HYSTEROSCOPY   08/1999    AT TIME OF LAPAROSCOPY   PELVIC LAPAROSCOPY   08/1999    AND HYSTEROSCOPY--ENDOMETRIOSIS   POLYPECTOMY          Social History         Socioeconomic History   Marital status: Married      Spouse name: Rosanne Ashing  Number of children: 1   Years of education: Not on file   Highest education level: Associate degree: occupational, technical, or vocational program  Occupational History   Not on file  Tobacco Use   Smoking status: Never   Smokeless tobacco: Never  Vaping Use   Vaping Use: Never used  Substance and Sexual Activity   Alcohol use: No      Alcohol/week: 0.0 standard drinks of alcohol   Drug use: No   Sexual activity: Not Currently      Partners: Male      Birth control/protection: Post-menopausal, Abstinence  Other Topics Concern   Not on file  Social History Narrative    h h of 2 Pet cat   Daughter /  Married     g1 p1    Works at [employer]    Social Determinants of Manufacturing engineer Strain: Not on [employer] Insecurity: Not on file  Transportation Needs: Not on file  Physical Activity: Not on file  Stress: Not on file  Social Connections: Not on file  Intimate Partner Violence: Not on file           Allergies  Allergen Reactions   Bee Venom Anaphylaxis      Very Large local reaction, leg and sob  seen in ed [employer] years ago    Etodolac Hives      Possible anaphylaxis  With vomiting and throat feeling tight  ED visit 8 14    Latex Hives, Rash and Other (See Comments)      Causes blisters   Other Hives, Rash and Other (See Comments)      Gel used for ultrasound-causes blisters also      Adhesive [Tape] Itching and Rash            Outpatient Medications Prior to Visit  Medication Sig Dispense Refill   cetirizine (ZYRTEC) 10 MG tablet Take 10 mg by mouth daily.       Ascorbic Acid (VITAMIN C) 500 MG CAPS Take 1,000 mg by mouth every morning.        b complex vitamins tablet Take 1 tablet by mouth daily.       CALCIUM-MAGNESIUM PO Take 3 tablets by mouth daily.        clobetasol ointment (TEMOVATE) 0.05 % SMARTSIG:1 sparingly Topical Twice Daily       cyclobenzaprine (FLEXERIL) 10 MG tablet Take 10 mg by mouth as needed for muscle spasms.        EPINEPHrine 0.3 mg/0.3 mL IJ SOAJ injection Inject 0.3 mg into the muscle as needed for anaphylaxis. (Patient not taking: Reported on 07/10/2022) 1 each 1   hydrochlorothiazide (HYDRODIURIL) 25 MG tablet TAKE 1 TABLET BY MOUTH DAILY 90 tablet 3   levothyroxine (SYNTHROID) 50 MCG tablet Take 1 tablet (50 mcg total) by mouth daily. 90 tablet 0   meclizine (ANTIVERT) 25 MG tablet TAKE 2 TABLET (50 MG TOTAL) BY MOUTH 2 (TWO) TIMES DAILY AS NEEDED (FOR TRAVEL). 30 tablet 0   Misc Natural Products (RED WINE EXTRACT PO) Take by mouth.       Multiple Vitamins-Iron (MULTIVITAMIN/IRON) TABS Take 1 tablet by mouth daily.       nystatin powder Apply topically 2 (two) times daily. As needed for rash 30 g 5   Rimegepant Sulfate (NURTEC) 75 MG TBDP Take 1 tablet by mouth as needed (for migraine). 8 tablet 12   sulindac (CLINORIL) 200 MG tablet Take 1  tablet (200 mg total) by mouth 2 (two) times daily as needed (headache). Reported on 09/23/2015 30 tablet 5   SUMAtriptan (IMITREX) 6 MG/0.5ML SOLN injection Inject 6 mg into the skin every 2 (two) hours as needed for migraine. Reported on 09/23/2015       traMADol (ULTRAM) 50 MG tablet Take 50 mg by mouth as needed for severe pain.        verapamil (CALAN-SR) 180 MG CR tablet TAKE 1 TABLET BY MOUTH AT BEDTIME 90 tablet 3             Facility-Administered Medications Prior to Visit  Medication Dose Route  Frequency Provider Last Rate Last Admin   methylPREDNISolone acetate (DEPO-MEDROL) injection 120 mg  120 mg Intramuscular Once Panosh, Neta Mends, MD         Review of Systems  Constitutional:  Negative for chills, fever, malaise/fatigue and weight loss.  HENT:  Negative for hearing loss, sore throat and tinnitus.   Eyes:  Negative for blurred vision and double vision.  Respiratory:  Negative for cough, hemoptysis, sputum production, shortness of breath, wheezing and stridor.   Cardiovascular:  Negative for chest pain, palpitations, orthopnea, leg swelling and PND.  Gastrointestinal:  Negative for abdominal pain, constipation, diarrhea, heartburn, nausea and vomiting.  Genitourinary:  Negative for dysuria, hematuria and urgency.  Musculoskeletal:  Negative for joint pain and myalgias.  Skin:  Negative for itching and rash.  Neurological:  Negative for dizziness, tingling, weakness and headaches.  Endo/Heme/Allergies:  Negative for environmental allergies. Does not bruise/bleed easily.  Psychiatric/Behavioral:  Negative for depression. The patient is not nervous/anxious and does not have insomnia.   All other systems reviewed and are negative.       Objective:   General appearance: 62 y.o., female, NAD, conversant  Eyes: anicteric sclerae, moist conjunctivae; no lid-lag; PERRLA, tracking appropriately HENT: NCAT; oropharynx, MMM, no mucosal ulcerations; normal hard and soft palate Neck: Trachea midline; FROM, supple, lymphadenopathy, no JVD Lungs: CTAB, no crackles, no wheeze, with normal respiratory effort and no intercostal retractions CV: RRR, S1, S2, no MRGs  Abdomen: Soft, non-tender; non-distended, BS present  Extremities: No peripheral edema, radial and DP pulses present bilaterally  Skin: Normal temperature, turgor and texture; no rash Psych: Appropriate affect Neuro: Alert and oriented to person and place, no focal deficit         BP 129/72   Pulse 83   Temp 99.6 F (37.6  C) (Oral)   Resp 19   Ht  (1.702 m)   Wt 99 kg   SpO2 94%   BMI 34.17 kg/m       CBC Labs (Brief)          Component Value Date/Time    WBC 9.6 07/07/2022 1926    RBC 5.29 (H) 07/07/2022 1926    HGB 15.4 (H) 07/07/2022 1926    HGB 14.2 07/04/2021 1443    HCT 45.8 07/07/2022 1926    PLT 397 07/07/2022 1926    PLT 345 07/04/2021 1443    MCV 86.6 07/07/2022 1926    MCV 91.3 11/20/2012 1131    MCH 29.1 07/07/2022 1926    MCHC 33.6 07/07/2022 1926    RDW 13.0 07/07/2022 1926    LYMPHSABS 2.6 06/12/2022 0855    MONOABS 0.6 06/12/2022 0855    EOSABS 0.2 06/12/2022 0855    BASOSABS 0.1 06/12/2022 0855          Chest Imaging:   CT chest without contrast 05/22/2022:  Groundglass opacity within the left upper lobe slowly enlarging over time.  Concerning for indolent neoplasm. The patient's images have been independently reviewed by me.     Pulmonary Functions Testing Results:       No data to display             FeNO:    Pathology:    Echocardiogram:    Heart Catheterization:     Assessment & Plan:        ICD-10-CM    1. Left upper lobe pulmonary nodule  R91.1 Ambulatory referral to Cardiothoracic Surgery      NM PET Image Initial (PI) Skull Base To Thigh (F-18 FDG)      Pulmonary Function Test     2. Ground glass opacity present on imaging of lung  R91.8       3. Nonsmoker  Z78.9           Discussion:   This is a 62 year old female seen today for evaluation of left upper lobe pulmonary nodule.  Has a groundglass opacity within the left upper lobe that has slowly enlarged over time concerning for malignancy.   Plan:  Here today for planned bronchoscopy  Dye mark followed by planned lobectomy    Josephine Igo, DO [facility] 10/14/2022 7:24 AM

## 2022-10-14 NOTE — Hospital Course (Addendum)
HPI: This is a 62 y.o. female presents for surgical evaluation of the left upper lobe groundglass opacity.  This originally was found incidentally, and has been followed for several years.  On most recent CT scan there has been some interval growth in size.  She is a lifelong non-smoker, but was only exposed to secondhand smoke for a short period.  She denies any shortness of breath or neurologic symptoms.   Dr. Cliffton Asters had a very long discussion about potential ways to treat this.  I recommended that she undergo a navigational bronchoscopy with biopsy marking followed by a left robotic assisted thoracoscopy with wedge resection and possible lobectomy.  She is agreeable to proceed.   Hospital Course: Patient underwent Xi assisted left thoracoscopy, wedge LUL, LUL, mediastinal lymph node dissection, and intercostal nerve block. She was extubated and transported from the OR to PACU in stable condition. A line and foley were removed early in post op course.Chest tube was to water seal, output decreased, daily chest x rays were obtained and remained stable. Chest tube was removed on 04/**. Follow up chest x ray showed **. Patient is ambulating on room air with good oxygenation. Patient is tolerating a diet. All wounds are clean, dry, healing without signs of infection.

## 2022-10-14 NOTE — Anesthesia Procedure Notes (Addendum)
Procedure Name: Intubation Date/Time: 10/14/2022 7:41 AM  Performed by: Alease Medina, CRNAPre-anesthesia Checklist: Patient identified, Emergency Drugs available, Suction available and Patient being monitored Patient Re-evaluated:Patient Re-evaluated prior to induction Oxygen Delivery Method: Circle system utilized Preoxygenation: Pre-oxygenation with 100% oxygen Induction Type: IV induction Ventilation: Mask ventilation without difficulty Laryngoscope Size: Mac and 3 Grade View: Grade I Tube type: Oral Tube size: 8.5 mm Number of attempts: 1 Airway Equipment and Method: Stylet and Oral airway Placement Confirmation: ETT inserted through vocal cords under direct vision, positive ETCO2 and breath sounds checked- equal and bilateral Tube secured with: Tape Dental Injury: Teeth and Oropharynx as per pre-operative assessment

## 2022-10-14 NOTE — H&P (Signed)
[address]       [place] [address]             [phone]                                                   [name] [facility] Medical Record #[id number] Date of Birth: 01-Jul-1960   Referring: Josephine Igo, DO Primary Care: Madelin Headings, MD Primary Cardiologist: None   Chief Complaint:        Chief Complaint  Patient presents with   Lung Lesion      New patient PET 2/14, PEFTs 2/1, Chest CT 12/1      History of Present Illness:    Laura Smith 62 y.o. female presents for surgical evaluation of the left upper lobe groundglass opacity.  This originally was found incidentally, and has been followed for several years.  On most recent CT scan there has been some interval growth in size.  Laura Smith is a lifelong non-smoker, but was only exposed to secondhand smoke for a short period.  Laura Smith denies any shortness of breath or neurologic symptoms.   Laura Smith had to reschedule due to back pain, but this better now.  Laura Smith has no complaints.   Smoking Hx: Lifelong non-smoker     Zubrod Score: At the time of surgery this patient's most appropriate activity status/level should be described as:     0    Normal activity, no symptoms     1    Restricted in physical strenuous activity but ambulatory, able to do out light work     2    Ambulatory and capable of self care, unable to do work activities, up and about               >50 % of waking hours                                  3    Only limited self care, in bed greater than 50% of waking hours     4    Completely disabled, no self care, confined to bed or chair     5    Moribund         Past Medical History:  Diagnosis Date   Allergy     Breast cyst     Endometriosis      surgery laser 2001   Family history of breast cancer     Family history of colon cancer     Family history of prostate cancer     Hemorrhoids     Hx of basal cell carcinoma     Hypertension     Migraine      Thyroid disease     Urinary tract infection             Past Surgical History:  Procedure Laterality Date   APPENDECTOMY   1992    AT C/S   BACK SURGERY   06/06/2020    L3-4   CESAREAN SECTION   1992    APPENDECTOMY SAME TIME   COLONOSCOPY   2015   HYSTEROSCOPY   08/1999    AT TIME OF LAPAROSCOPY   PELVIC LAPAROSCOPY  08/1999    AND HYSTEROSCOPY--ENDOMETRIOSIS   POLYPECTOMY               Family History  Problem Relation Age of Onset   Breast cancer Mother          Age 59   Colon cancer Mother 37   Breast cancer Cousin 25        paternal first cousin   Heart disease Father 46        Heart attack   Hypertension Paternal Grandmother     Heart disease Paternal Grandmother     Stroke Paternal Grandmother     Cancer Paternal Grandmother          Lung   Cancer Paternal Grandfather          Prostate   Stroke Maternal Grandfather     COPD Maternal Uncle     Liver cancer Paternal Uncle     Congestive Heart Failure Maternal Grandmother     Hypertension Paternal Aunt     Colon polyps Neg Hx     Stomach cancer Neg Hx     Rectal cancer Neg Hx     Esophageal cancer Neg Hx          Social History       Tobacco Use  Smoking Status Never  Smokeless Tobacco Never    Social History        Substance and Sexual Activity  Alcohol Use No   Alcohol/week: 0.0 standard drinks of alcohol             Allergies  Allergen Reactions   Bee Venom Anaphylaxis      Very Large local reaction, leg and sob  seen in [place]    Etodolac Hives      Possible anaphylaxis  With vomiting and throat feeling tight  ED visit 8 14    Latex Hives, Rash and Other (See Comments)      Causes blisters   Other Hives, Rash and Other (See Comments)      Gel used for ultrasound-causes blisters also     Adhesive [Tape] Itching and Rash            Current Outpatient Medications  Medication Sig Dispense Refill   Ascorbic Acid (VITAMIN C) 500 MG CAPS Take 1,000 mg by mouth  every morning.        b complex vitamins tablet Take 1 tablet by mouth daily.       CALCIUM-MAGNESIUM PO Take 3 tablets by mouth daily.        cetirizine (ZYRTEC) 10 MG tablet Take 10 mg by mouth daily.       clobetasol ointment (TEMOVATE) 0.05 % SMARTSIG:1 sparingly Topical Twice Daily       cyclobenzaprine (FLEXERIL) 10 MG tablet Take 10 mg by mouth as needed for muscle spasms.        EPINEPHrine 0.3 mg/0.3 mL IJ SOAJ injection Inject 0.3 mg into the muscle as needed for anaphylaxis. 1 each 1   hydrochlorothiazide (HYDRODIURIL) 25 MG tablet TAKE 1 TABLET BY MOUTH DAILY 90 tablet 3   levothyroxine (SYNTHROID) 50 MCG tablet Take 1 tablet (50 mcg total) by mouth daily. 90 tablet 0   meclizine (ANTIVERT) 25 MG tablet TAKE 2 TABLET (50 MG TOTAL) BY MOUTH 2 (TWO) TIMES DAILY AS NEEDED (FOR TRAVEL). 30 tablet 0   Misc Natural Products (RED WINE EXTRACT PO) Take by mouth.       Multiple Vitamins-Iron (  MULTIVITAMIN/IRON) TABS Take 1 tablet by mouth daily.       nystatin powder Apply topically 2 (two) times daily. As needed for rash 30 g 5   Rimegepant Sulfate (NURTEC) 75 MG TBDP Take 1 tablet by mouth as needed (for migraine). 8 tablet 12   sulindac (CLINORIL) 200 MG tablet Take 1 tablet (200 mg total) by mouth 2 (two) times daily as needed (headache). Reported on 09/23/2015 30 tablet 5   SUMAtriptan (IMITREX) 6 MG/0.5ML SOLN injection Inject 6 mg into the skin every 2 (two) hours as needed for migraine. Reported on 09/23/2015       traMADol (ULTRAM) 50 MG tablet Take 50 mg by mouth as needed for severe pain.        verapamil (CALAN-SR) 180 MG CR tablet TAKE 1 TABLET BY MOUTH AT BEDTIME 90 tablet 3             Current Facility-Administered Medications  Medication Dose Route Frequency Provider Last Rate Last Admin   methylPREDNISolone acetate (DEPO-MEDROL) injection 120 mg  120 mg Intramuscular Once Panosh, Neta Mends, MD          Review of Systems  Constitutional:  Negative for malaise/fatigue and weight  loss.  Respiratory:  Negative for cough and shortness of breath.   Cardiovascular:  Negative for chest pain.  Neurological: Negative.         PHYSICAL EXAMINATION: Vitals:   10/14/22 0558  BP: 129/72  Pulse: 83  Resp: 19  Temp: 99.6 F (37.6 C)  SpO2: 94%    Physical Exam Constitutional:      Appearance: Normal appearance.  HENT:     Head: Normocephalic and atraumatic.  Eyes:     Extraocular Movements: Extraocular movements intact.  Cardiovascular:     Rate and Rhythm: Normal rate.  Pulmonary:     Effort: Pulmonary effort is normal. No respiratory distress.  Abdominal:     General: There is no distension.  Musculoskeletal:        General: Normal range of motion.  Skin:    General: Skin is warm and dry.  Neurological:     General: No focal deficit present.     Mental Status: Laura Smith is alert and oriented to person, place, and time.        Diagnostic Studies & Laboratory data:     Recent Radiology Findings:    Imaging Results  NM PET Image Initial (PI) Skull Base To Thigh (F-18 FDG)   Result Date: 08/06/2022 CLINICAL DATA:  Initial treatment strategy for lung nodule. EXAM: NUCLEAR MEDICINE PET SKULL BASE TO THIGH TECHNIQUE: 10.9 mCi F-18 FDG was injected intravenously. Full-ring PET imaging was performed from the skull base to thigh after the radiotracer. CT data was obtained and used for attenuation correction and anatomic localization. Fasting blood glucose: 98 mg/dl COMPARISON:  CT chest 04/54/0981 FINDINGS: Mediastinal blood pool activity: SUV max 3.13 Liver activity: SUV max NA NECK: No hypermetabolic lymph nodes in the neck. Incidental CT findings: None. CHEST: Subsolid, ground-glass nodule within the subpleural aspect of the medial left apex is again seen. This measures 1.2 cm with SUV max of 1.39, image 11/7. Unchanged tiny solid nodule in the medial aspect of the right upper lobe measuring 4 mm, image 16/7. Too small to characterize by PET-CT. No tracer avid  mediastinal or hilar lymph nodes. Incidental CT findings: None. ABDOMEN/PELVIS: No abnormal hypermetabolic activity within the liver, pancreas, adrenal glands, or spleen. No hypermetabolic lymph nodes in the abdomen or pelvis.  Incidental CT findings: None. SKELETON: No focal hypermetabolic activity to suggest skeletal metastasis. Incidental CT findings: None. IMPRESSION: 1. There is mild FDG uptake associated with the sub solid, ground-glass nodule within the medial left apex. The degree of FDG uptake is less than background mediastinal blood pool. This degree of FDG uptake is nonspecific and may be seen with inflammatory/infectious etiologies. Low grade pulmonary neoplasm such as adenocarcinoma may also present as a subsolid nodule with low level FDG uptake. 2. No signs of tracer avid nodal metastasis or distant metastatic disease. 3. Unchanged 4 mm solid nodule in the medial aspect of the right upper lobe. Too small to characterize by PET-CT. Electronically Signed   By: Signa Kell M.D.   On: 08/06/2022 09:31           I have independently reviewed the above radiology studies  and reviewed the findings with the patient.    Recent Lab Findings: Recent Labs       Lab Results  Component Value Date    WBC 9.6 07/07/2022    HGB 15.4 (H) 07/07/2022    HCT 45.8 07/07/2022    PLT 397 07/07/2022    GLUCOSE 110 (H) 07/07/2022    CHOL 196 06/12/2022    TRIG 120.0 06/12/2022    HDL 57.30 06/12/2022    LDLDIRECT 133.8 04/07/2013    LDLCALC 115 (H) 06/12/2022    ALT 19 06/12/2022    AST 23 06/12/2022    NA 138 07/07/2022    K 3.6 07/07/2022    CL 99 07/07/2022    CREATININE 0.87 07/07/2022    BUN 16 07/07/2022    CO2 28 07/07/2022    TSH 1.66 06/12/2022    HGBA1C 6.0 06/11/2021          PFTs:   - FVC: 87% - FEV1: 85% -DLCO: 101%          CHEST: Subsolid, ground-glass nodule within the subpleural aspect of the medial left apex is again seen. This measures 1.2 cm with SUV max of  1.39, image 11/7. Unchanged tiny solid nodule in the medial aspect of the right upper lobe measuring 4 mm, image 16/7. Too small to characterize by PET-CT.   Assessment / Plan:   62yo female with 1.2cm left upper lobe GGO with interval enlargement.  Laura Smith is a life long non-smoker.  We had a very long discussion about potential ways to treat this.  I recommended that Laura Smith undergo a navigational bronchoscopy with biopsy marking followed by a left robotic assisted thoracoscopy with wedge resection and possible lobectomy.  Laura Smith is agreeable to proceed.

## 2022-10-14 NOTE — Discharge Summary (Signed)
Physician Discharge Summary       [address] [place]       [place] [address]             [phone]    Patient ID: Laura Smith MRN: [id number] DOB/AGE: [birth year] 62 y.o.  Admit date: 10/14/2022 Discharge date: 10/15/2022  Admission Diagnoses: Left upper lobe lung nodule Discharge Diagnoses:  Flexible video fiberoptic bronchoscopy with robotic assistance and biopsies.  2. S/p Robotic assisted left video thoracoscopy, LUL wedge, LUL, mediastinal lymph node sampling, and intercostal nerve block 3. History of the following: Allergy Breast cyst Endometriosis Hemorrhoids Basal cell carcinoma Hypertension Migraine Thyroid disease UTI  Consults: None  Procedure (s): Surgery  Robotic assisted left video thoracoscopy - Left upper lobe wedge resection - left upper lobectomy - Mediastinal lymph node sampling - Intercostal nerve block by Dr. Cliffton Asters on 10/14/2022   Pathology: A. LUNG, LEFT UPPER LOBE, WEDGE RESECTION:      Invasive adenocarcinoma, lepidic (60%), papillary (30%) and acinar (10%) patterns.      Tumor size: 1.3 x 1.0 x 0.7 cm.      No pleural invasion identified.      No lymphovascular invasion identified.      Parenchymal margin is negative for carcinoma (see part K for final margin status).      See oncology table.  B. LYMPH NODE, LEVEL 9, EXCISION:      One lymph node, negative for metastatic carcinoma (0/1).  C. LYMPH NODE, HILAR, EXCISION:      One lymph node, negative for metastatic carcinoma (0/1).  D. LYMPH NODE, HILAR #2, EXCISION:      One lymph node, negative for metastatic carcinoma (0/1).  E. LYMPH NODE, LEVEL #6, EXCISION:      One lymph node, negative for metastatic carcinoma (0/1).  F. LYMPH NODE, HILAR #3, EXCISION:      One lymph node, negative for metastatic carcinoma (0/1).  G. LYMPH NODE, HILAR #4, EXCISION:      One lymph node, negative for metastatic carcinoma (0/1).  H. LYMPH NODE, HILAR #5, EXCISION:       One lymph node, negative for metastatic carcinoma (0/1).  I. LYMPH NODE, HILAR #6, EXCISION:      One lymph node, negative for metastatic carcinoma (0/1).  J. LYMPH NODE, LEVEL 7, EXCISION:      One lymph node, negative for metastatic carcinoma (0/1).  K. LUNG, LEFT UPPER LOBE, LOBECTOMY:      Benign pulmonary parenchyma with subpleural scarring.      Three lymph nodes, negative for metastatic carcinoma (0/3).      Surgical margins of resection are negative for carcinoma. TNM Code:pT1b, pN0        Negative for malignancy.   Physical Exam: Cardiovascular: RRR Pulmonary: Clear to auscultation bilaterally; Abdomen: Soft, non tender, bowel sounds present. Extremities: Trace bilateral lower extremity edema. Wounds: Clean and dry.  No erythema or signs of infection.   Discharge Condition:Stable   Recent laboratory studies:  Lab Results  Component Value Date   WBC 12.9 (H) 10/15/2022   HGB 12.2 10/15/2022   HCT 35.4 (L) 10/15/2022   MCV 86.3 10/15/2022   PLT 295 10/15/2022   Lab Results  Component Value Date   NA 135 10/15/2022   K 3.8 10/15/2022   CL 97 (L) 10/15/2022   CO2 28 10/15/2022   CREATININE 1.03 (H) 10/15/2022   GLUCOSE 135 (H) 10/15/2022      Diagnostic Studies:   Narrative & Impression  CLINICAL DATA:  Chest tube removal   EXAM: CHEST - 2 VIEW   COMPARISON:  Chest x-ray dated October 15, 2018   FINDINGS: Cardiac and mediastinal contours are unchanged. Both lungs are clear. No pleural effusion or pneumothorax. Interval removal of left-sided chest tube.   IMPRESSION: Status post left chest tube removal. Stable trace bilateral pneumothoraces.     Electronically Signed   By: Allegra Lai M.D.   On: 10/15/2022 15:21        DG Chest 2 View  Result Date: 10/15/2022 CLINICAL DATA:  Chest tube removal EXAM: CHEST - 2 VIEW COMPARISON:  Chest x-ray dated October 15, 2018 FINDINGS: Cardiac and mediastinal contours are unchanged. Both lungs are  clear. No pleural effusion or pneumothorax. Interval removal of left-sided chest tube. IMPRESSION: Status post left chest tube removal. Stable trace bilateral pneumothoraces. Electronically Signed   By: Allegra Lai M.D.   On: 10/15/2022 15:21   DG Chest Port 1 View  Result Date: 10/15/2022 CLINICAL DATA:  Left pneumothorax. EXAM: PORTABLE CHEST 1 VIEW COMPARISON:  Chest x-ray dated October 14, 2022 FINDINGS: Cardiac and mediastinal contours are unchanged. Bilateral trace pneumothoraces. Left-sided chest tube in place. No evidence of pleural effusion. IMPRESSION: Bilateral trace pneumothoraces. Left-sided chest tube in place. These results will be called to the ordering clinician or representative by the Radiologist Assistant, and communication documented in the PACS or [employer]. Electronically Signed   By: Allegra Lai M.D.   On: 10/15/2022 08:52   DG Chest 2 View  Result Date: 10/14/2022 CLINICAL DATA:  Preop evaluation for upcoming upper lobectomy on the left EXAM: CHEST - 2 VIEW COMPARISON:  07/07/2022 FINDINGS: Cardiac shadow is within normal limits. The lungs are well aerated bilaterally. Known left upper lobe nodule is not well appreciated on this exam. No bony abnormality is seen. IMPRESSION: No active cardiopulmonary disease. Electronically Signed   By: Alcide Clever M.D.   On: 10/14/2022 13:05   DG Chest Port 1 View  Result Date: 10/14/2022 CLINICAL DATA:  Status post left lobectomy EXAM: PORTABLE CHEST 1 VIEW COMPARISON:  10/12/2022 FINDINGS: Cardiac shadow is within normal limits. Changes of left upper lobectomy are noted. Left chest tube is noted in place. No pneumothorax is seen. Right lung demonstrates some mild midlung atelectasis. No bony abnormality is seen. IMPRESSION: Status post left upper lobectomy.  No pneumothorax is noted. Electronically Signed   By: Alcide Clever M.D.   On: 10/14/2022 13:04   DG C-ARM BRONCHOSCOPY  Result Date: 10/14/2022 C-ARM BRONCHOSCOPY:  Fluoroscopy was utilized by the requesting physician.  No radiographic interpretation.   Myocardial Perfusion Imaging  Result Date: 09/25/2022   Findings are consistent with infarction. The study is intermediate risk.   No ST deviation was noted.   LV perfusion is abnormal. There is no evidence of ischemia. There is evidence of infarction. Defect 1: There is a medium defect with mild reduction in uptake present in the apical to basal septal and apex location(s) that is fixed. There is abnormal wall motion in the defect area. Consistent with infarction.   Left ventricular function is normal. Nuclear stress EF: 57 %. The left ventricular ejection fraction is normal (55-65%). End diastolic cavity size is normal. End systolic cavity size is normal. No evidence of transient ischemic dilation (TID) noted.   Prior study available for comparison from 04/06/2017. There are changes compared to prior study.     Discharge Medications: Allergies as of 10/15/2022  Reactions   Bee Venom Anaphylaxis   Very Large local reaction, leg and sob  seen in [place] ago    Etodolac Hives   Possible anaphylaxis  With vomiting and throat feeling tight  ED visit 8 14    Latex Hives, Rash, Other (See Comments)   Causes blisters   Other Hives, Rash, Other (See Comments)   Gel used for ultrasound-causes blisters also   Adhesive [tape] Itching, Rash        Medication List     STOP taking these medications    traMADol 50 MG tablet Commonly known as: ULTRAM       TAKE these medications    b complex vitamins tablet Take 1 tablet by mouth daily.   CALCIUM + D PO Take 1 tablet by mouth every other day. Alternate days with calcium magnesium   CALCIUM-MAG-VIT C-VIT D PO Take 1 tablet by mouth every other day. Alternate days with calcium vitamin d   cetirizine 10 MG tablet Commonly known as: ZYRTEC Take 10 mg by mouth daily.   clobetasol ointment 0.05 % Commonly known as: TEMOVATE Apply 1  Application topically 2 (two) times daily as needed.   cyclobenzaprine 10 MG tablet Commonly known as: FLEXERIL Take 10 mg by mouth 3 (three) times daily as needed for muscle spasms.   EPINEPHrine 0.3 mg/0.3 mL Soaj injection Commonly known as: EPI-PEN Inject 0.3 mg into the muscle as needed for anaphylaxis.   hydrochlorothiazide 25 MG tablet Commonly known as: HYDRODIURIL TAKE 1 TABLET BY MOUTH DAILY   ibuprofen 200 MG tablet Commonly known as: ADVIL Take 600 mg by mouth every 6 (six) hours as needed for moderate pain.   levothyroxine 50 MCG tablet Commonly known as: SYNTHROID TAKE 1 TABLET DAILY   meclizine 25 MG tablet Commonly known as: ANTIVERT TAKE 2 TABLET (50 MG TOTAL) BY MOUTH 2 (TWO) TIMES DAILY AS NEEDED (FOR TRAVEL).   Multivitamin/Iron Tabs Take 1 tablet by mouth daily.   Nurtec 75 MG Tbdp Generic drug: Rimegepant Sulfate Take 1 tablet by mouth as needed (for migraine).   nystatin powder Commonly known as: nystatin Apply topically 2 (two) times daily. As needed for rash   oxyCODONE 5 MG immediate release tablet Commonly known as: Oxy IR/ROXICODONE Take 1 tablet (5 mg total) by mouth every 6 (six) hours as needed for moderate pain.   RED WINE EXTRACT PO Take 1 tablet by mouth daily.   sulindac 200 MG tablet Commonly known as: CLINORIL Take 1 tablet (200 mg total) by mouth 2 (two) times daily as needed (headache). Reported on 09/23/2015   verapamil 180 MG CR tablet Commonly known as: CALAN-SR TAKE 1 TABLET BY MOUTH AT BEDTIME What changed: when to take this   vitamin C 1000 MG tablet Take 1,000 mg by mouth 2 (two) times daily.   vitamin E 180 MG (400 UNITS) capsule Take 400 Units by mouth daily.        Follow Up Appointments:  Follow-up Information     Corliss Skains, MD. Go on 10/23/2022.   Specialty: Cardiothoracic Surgery Why: Appointment time is at 9:30 am Contact information: [address] [place]  [address] [phone]                 Signed: Lelon Huh [facility] 10/16/2022, 12:33 PM

## 2022-10-14 NOTE — Anesthesia Procedure Notes (Signed)
Arterial Line Insertion Start/End4/24/2024 7:00 AM, 10/14/2022 7:11 AM Performed by: Alease Medina, CRNA, CRNA  Patient location: Pre-op. Preanesthetic checklist: patient identified, IV checked, site marked, risks and benefits discussed, surgical consent, monitors and equipment checked, pre-op evaluation, timeout performed and anesthesia consent Lidocaine 1% used for infiltration Left, radial was placed Catheter size: 20 G Hand hygiene performed  and maximum sterile barriers used  Allen's test indicative of satisfactory collateral circulation Attempts: 1 Procedure performed without using ultrasound guided technique. Post procedure assessment: normal  Patient tolerated the procedure well with no immediate complications.

## 2022-10-14 NOTE — Op Note (Signed)
Video Bronchoscopy with Robotic Assisted Bronchoscopic Navigation   Date of Operation: 10/14/2022   Pre-op Diagnosis: Left upper lobe nodule  Post-op Diagnosis: Left upper lobe nodule  Surgeon: Rachel Bo Philopater Mucha,DO  Assistants: None  Anesthesia: General endotracheal anesthesia  Operation: Flexible video fiberoptic bronchoscopy with robotic assistance and biopsies.  Estimated Blood Loss: Minimal  Complications: None  Indications and History: Laura Smith is a 62 y.o. female with history of left upper lobe nodule. The risks, benefits, complications, treatment options and expected outcomes were discussed with the patient.  The possibilities of pneumothorax, pneumonia, reaction to medication, pulmonary aspiration, perforation of a viscus, bleeding, failure to diagnose a condition and creating a complication requiring transfusion or operation were discussed with the patient who freely signed the consent.    Description of Procedure: The patient was seen in the Preoperative Area, was examined and was deemed appropriate to proceed.  The patient was taken to [facility] endoscopy room 3, identified as Laura Smith and the procedure verified as Flexible Video Fiberoptic Bronchoscopy.  A Time Out was held and the above information confirmed.   Prior to the date of the procedure a high-resolution CT scan of the chest was performed. Utilizing ION software program a virtual tracheobronchial tree was generated to allow the creation of distinct navigation pathways to the patient's parenchymal abnormalities. After being taken to the operating room general anesthesia was initiated and the patient  was orally intubated. The video fiberoptic bronchoscope was introduced via the endotracheal tube and a general inspection was performed which showed normal right and left lung anatomy, aspiration of the bilateral mainstems was completed to remove any remaining secretions. Robotic catheter inserted into patient's  endotracheal tube.   Target #1 left upper lobe nodule: The distinct navigation pathways prepared prior to this procedure were then utilized to navigate to patient's lesion identified on CT scan. The robotic catheter was secured into place and the vision probe was withdrawn.  Lesion location was approximated using fluoroscopy and three-dimensional cone beam CT imaging for CT guided needle placement and for peripheral targeting. Under fluoroscopic guidance transbronchial forceps biopsies were performed to be sent for cytology and pathology.  Following biopsy specimens 1 cc of 50% / 50% mixture of methylene blue and ICG was injected into the lesion for fiducial dye marking.  Fiducial diam marker was inserted under CT guidance using a transbronchial needle.  At the end of the procedure a general airway inspection was performed and there was no evidence of active bleeding. The bronchoscope was removed.  The patient tolerated the procedure well. There was no significant blood loss and there were no obvious complications. A post-procedural chest x-ray is pending.  Samples Target #1: 1. Transbronchial Wang needle insertion under CT guidance for fiducial dye marking 2. Transbronchial forceps biopsies from left upper lobe  Plans:  Based on the operative findings a decision was made for transfer to the main OR under the care of Dr. Cliffton Asters for possible wedge resection and possible lobectomy.  Josephine Igo, DO [facility] 10/14/2022 8:15 AM

## 2022-10-14 NOTE — Transfer of Care (Signed)
Immediate Anesthesia Transfer of Care Note  Patient: Laura Smith  Procedure(s) Performed: XI ROBOTIC ASSISTED THORACOSCOPY-LEFT UPPER LOBE WEDGE RESECTION, possible lobectomy (Left: Chest) INTERCOSTAL NERVE BLOCK (Left: Chest) NODE DISSECTION (Left: Chest)  Patient Location: PACU  Anesthesia Type:General  Level of Consciousness: drowsy and patient cooperative  Airway & Oxygen Therapy: Patient Spontanous Breathing and Patient connected to face mask oxygen  Post-op Assessment: Report given to RN, Post -op Vital signs reviewed and stable, and Patient moving all extremities X 4  Post vital signs: Reviewed and stable  Last Vitals:  Vitals Value Taken Time  BP 136/60 10/14/22 1121  Temp    Pulse 89 10/14/22 1123  Resp 11 10/14/22 1123  SpO2 100 % 10/14/22 1123  Vitals shown include unvalidated device data.  Last Pain:  Vitals:   10/14/22 0614  TempSrc:   PainSc: 0-No pain      Patients Stated Pain Goal: 0 (10/14/22 1610)  Complications: No notable events documented.

## 2022-10-14 NOTE — Interval H&P Note (Signed)
History and Physical Interval Note:  10/14/2022 7:26 AM  Laura Smith  has presented today for surgery, with the diagnosis of lung nodule.  The various methods of treatment have been discussed with the patient and family. After consideration of risks, benefits and other options for treatment, the patient has consented to  Procedure(s): ROBOTIC ASSISTED NAVIGATIONAL BRONCHOSCOPY (N/A) as a surgical intervention.  The patient's history has been reviewed, patient examined, no change in status, stable for surgery.  I have reviewed the patient's chart and labs.  Questions were answered to the patient's satisfaction.     Rachel Bo Divina Neale

## 2022-10-15 ENCOUNTER — Encounter (HOSPITAL_COMMUNITY): Payer: Self-pay | Admitting: Thoracic Surgery (Cardiothoracic Vascular Surgery)

## 2022-10-15 ENCOUNTER — Inpatient Hospital Stay (HOSPITAL_COMMUNITY): Payer: BC Managed Care – PPO

## 2022-10-15 LAB — CBC
HCT: 35.4 % — ABNORMAL LOW (ref 36.0–46.0)
Hemoglobin: 12.2 g/dL (ref 12.0–15.0)
MCH: 29.8 pg (ref 26.0–34.0)
MCHC: 34.5 g/dL (ref 30.0–36.0)
MCV: 86.3 fL (ref 80.0–100.0)
Platelets: 295 10*3/uL (ref 150–400)
RBC: 4.1 MIL/uL (ref 3.87–5.11)
RDW: 13.7 % (ref 11.5–15.5)
WBC: 12.9 10*3/uL — ABNORMAL HIGH (ref 4.0–10.5)
nRBC: 0 % (ref 0.0–0.2)

## 2022-10-15 LAB — BASIC METABOLIC PANEL
Anion gap: 10 (ref 5–15)
BUN: 13 mg/dL (ref 8–23)
CO2: 28 mmol/L (ref 22–32)
Calcium: 8.5 mg/dL — ABNORMAL LOW (ref 8.9–10.3)
Chloride: 97 mmol/L — ABNORMAL LOW (ref 98–111)
Creatinine, Ser: 1.03 mg/dL — ABNORMAL HIGH (ref 0.44–1.00)
GFR, Estimated: >60 mL/min (ref >60)
Glucose, Bld: 135 mg/dL — ABNORMAL HIGH (ref 70–99)
Potassium: 3.8 mmol/L (ref 3.5–5.1)
Sodium: 135 mmol/L (ref 135–145)

## 2022-10-15 LAB — SURGICAL PATHOLOGY

## 2022-10-15 LAB — CYTOLOGY - NON PAP

## 2022-10-15 MED ORDER — VERAPAMIL HCL ER 180 MG PO TBCR: 180.0000 mg | EXTENDED_RELEASE_TABLET | Freq: Every day | ORAL | Status: DC

## 2022-10-15 MED ORDER — POTASSIUM CHLORIDE CRYS ER 20 MEQ PO TBCR
30.0000 meq | EXTENDED_RELEASE_TABLET | Freq: Once | ORAL | Status: AC
  Administered 2022-10-15: 30 meq via ORAL
  Filled 2022-10-15: qty 1

## 2022-10-15 MED ORDER — HYDROCHLOROTHIAZIDE 25 MG PO TABS
12.5000 mg | ORAL_TABLET | Freq: Every day | ORAL | Status: DC
  Administered 2022-10-15: 12.5 mg via ORAL
  Filled 2022-10-15: qty 1

## 2022-10-15 MED ORDER — OXYCODONE HCL 5 MG PO TABS: 5.0000 mg | ORAL_TABLET | Freq: Four times a day (QID) | ORAL | 0 refills | Status: DC | PRN

## 2022-10-15 NOTE — Progress Notes (Signed)
Chest tube removed, VSS  Balinda Quails, RN 10/15/2022 9:16 AM

## 2022-10-15 NOTE — Discharge Instructions (Signed)
obot-Assisted Thoracic Surgery, Care After The following information offers guidance on how to care for yourself after your procedure. Your health care provider may also give you more specific instructions. If you have problems or questions, contact your health care provider. What can I expect after the procedure? After the procedure, it is common to have: Some pain and aches in the area of your surgical incisions. Pain when breathing in (inhaling) and coughing. Tiredness (fatigue). Trouble sleeping. Constipation. Follow these instructions at home: Medicines Take over-the-counter and prescription medicines only as told by your health care provider. If you were prescribed an antibiotic medicine, take it as told by your health care provider. Do not stop taking the antibiotic even if you start to feel better. Talk with your health care provider about safe and effective ways to manage pain after your procedure. Pain management should fit your specific health needs. Take pain medicine before pain becomes severe. Relieving and controlling your pain will make breathing easier for you. Ask your health care provider if the medicine prescribed to you requires you to avoid driving or using machinery. Eating and drinking Follow instructions from your health care provider about eating or drinking restrictions. These will vary depending on what procedure you had. Your health care provider may recommend: A liquid diet or soft diet for the first few days. Meals that are smaller and more frequent. A diet of fruits, vegetables, whole grains, and low-fat proteins. Limiting foods that are high in fat and processed sugar, including fried or sweet foods. Incision care Follow instructions from your health care provider about how to take care of your incisions. Make sure you: Wash your hands with soap and water for at least 20 seconds before and after you change your bandage (dressing). If soap and water are not  available, use hand sanitizer. Change your dressing as told by your health care provider. Leave stitches (sutures), skin glue, or adhesive strips in place. These skin closures may need to stay in place for 2 weeks or longer. If adhesive strip edges start to loosen and curl up, you may trim the loose edges. Do not remove adhesive strips completely unless your health care provider tells you to do that. Check your incision area every day for signs of infection. Check for: Redness, swelling, or more pain. Fluid or blood. Warmth. Pus or a bad smell. Activity Return to your normal activities as told by your health care provider. Ask your health care provider what activities are safe for you. Ask your health care provider when it is safe for you to drive. Do not lift anything that is heavier than 10 lb (4.5 kg), or the limit that you are told, until your health care provider says that it is safe. Rest as told by your health care provider. Avoid sitting for a long time without moving. Get up to take short walks every 1-2 hours. This is important to improve blood flow and breathing. Ask for help if you feel weak or unsteady. Do exercises as told by your health care provider. Pneumonia prevention  Do deep breathing exercises and cough regularly as directed. This helps clear mucus and opens your lungs. Doing this helps prevent lung infection (pneumonia). If you were given an incentive spirometer, use it as told. An incentive spirometer is a tool that measures how well you are filling your lungs with each breath. Coughing may hurt less if you try to support your chest. This is called splinting. Try one of these when you   cough: Hold a pillow against your chest. Place the palms of both hands on top of your incision area. Do not use any products that contain nicotine or tobacco. These products include cigarettes, chewing tobacco, and vaping devices, such as e-cigarettes. If you need help quitting, ask your  health care provider. Avoid secondhand smoke. General instructions If you have a drainage tube: Follow instructions from your health care provider about how to take care of it. Do not travel by airplane after your tube is removed until your health care provider tells you it is safe. You may need to take these actions to prevent or treat constipation: Drink enough fluid to keep your urine pale yellow. Take over-the-counter or prescription medicines. Eat foods that are high in fiber, such as beans, whole grains, and fresh fruits and vegetables. Limit foods that are high in fat and processed sugars, such as fried or sweet foods. Keep all follow-up visits. This is important. Contact a health care provider if: You have redness, swelling, or more pain around an incision. You have fluid or blood coming from an incision. An incision feels warm to the touch. You have pus or a bad smell coming from an incision. You have a fever. You cannot eat or drink without vomiting. Your pain medicine is not controlling your pain. Get help right away if: You have chest pain. Your heart is beating quickly. You have trouble breathing. You have trouble speaking. You are confused. You feel weak or dizzy, or you faint. These symptoms may represent a serious problem that is an emergency. Do not wait to see if the symptoms will go away. Get medical help right away. Call your local emergency services (911 in the [place] Do not drive yourself to the hospital. Summary Talk with your health care provider about safe and effective ways to manage pain after your procedure. Pain management should fit your specific health needs. Return to your normal activities as told by your health care provider. Ask your health care provider what activities are safe for you. Do deep breathing exercises and cough regularly as directed. This helps to clear mucus and prevent pneumonia. If it hurts to cough, ease pain by holding a pillow  against your chest or by placing the palms of both hands over your incisions. This information is not intended to replace advice given to you by your health care provider. Make sure you discuss any questions you have with your health care provider. Document Revised: 03/01/2020 Document Reviewed: 03/01/2020 Elsevier Patient Education  2023 [employer] 

## 2022-10-15 NOTE — Anesthesia Postprocedure Evaluation (Signed)
Anesthesia Post Note  Patient: Laura Smith  Procedure(s) Performed: XI ROBOTIC ASSISTED THORACOSCOPY-LEFT UPPER LOBE WEDGE RESECTION, possible lobectomy (Left: Chest) INTERCOSTAL NERVE BLOCK (Left: Chest) NODE DISSECTION (Left: Chest)     Patient location during evaluation: PACU Anesthesia Type: General Level of consciousness: sedated and patient cooperative Pain management: pain level controlled Vital Signs Assessment: post-procedure vital signs reviewed and stable Respiratory status: spontaneous breathing Cardiovascular status: stable Anesthetic complications: no   No notable events documented.  Last Vitals:  Vitals:   10/14/22 2252 10/15/22 0303  BP: 115/61 129/74  Pulse: 91 81  Resp: 15 16  Temp: 37.2 C 36.6 C  SpO2: 96% 96%    Last Pain:  Vitals:   10/15/22 0428  TempSrc:   PainSc: 2                  Lewie Loron

## 2022-10-15 NOTE — Progress Notes (Addendum)
      [address]       Laura Smith [address]             [phone]       1 Day Post-Op Procedure(s) (LRB): XI ROBOTIC ASSISTED THORACOSCOPY-LEFT UPPER LOBE WEDGE RESECTION, possible lobectomy (Left) INTERCOSTAL NERVE BLOCK (Left) NODE DISSECTION (Left)  Subjective: Patient was up urinating a lot (she takes Verapamil in the am so will change). She had multiple questions and all answered but 2, which she will ask Dr. Cliffton Asters.  Objective: Vital signs in last 24 hours: Temp:  [97.7 F (36.5 C)-98.9 F (37.2 C)] 97.8 F (36.6 C) (04/25 0303) Pulse Rate:  [78-98] 81 (04/25 0303) Cardiac Rhythm: Sinus tachycardia;Bundle branch block (04/24 1900) Resp:  [13-18] 16 (04/25 0303) BP: (110-137)/(60-81) 129/74 (04/25 0303) SpO2:  [95 %-100 %] 96 % (04/25 0303)     Intake/Output from previous day: 04/24 0701 - 04/25 0700 In: 2006.9 [I.V.:1800; IV Piggyback:206.9] Out: 514 [Urine:55; Blood:150; Chest Tube:309]   Physical Exam:  Cardiovascular: RRR Pulmonary: Clear to auscultation bilaterally; Abdomen: Soft, non tender, bowel sounds present. Extremities: Trace bilateral lower extremity edema. Wounds: Clean and dry.  No erythema or signs of infection. Chest Tube:to water seal, no air leak  Lab Results: CBC: Recent Labs    10/12/22 1024 10/15/22 0033  WBC 9.0 12.9*  HGB 14.7 12.2  HCT 45.5 35.4*  PLT 345 295   BMET:  Recent Labs    10/12/22 1024 10/15/22 0033  NA 137 135  K 3.8 3.8  CL 101 97*  CO2 28 28  GLUCOSE 87 135*  BUN 11 13  CREATININE 0.95 1.03*  CALCIUM 8.9 8.5*    PT/INR:  Recent Labs    10/12/22 1024  LABPROT 12.7  INR 1.0   ABG:  INR: Will add last result for INR, ABG once components are confirmed Will add last 4 CBG results once components are confirmed  Assessment/Plan:  1. CV - SR. On low dose HCTZ and Verapamil 180 mg at hs but will change to am.  2.  Pulmonary - On room air. Chest tube with 309 cc since surgery.  Chest tube to water seal, no air leak. CXR this am appears stable. Per Dr. Cliffton Asters, remove chest tube. Encourage incentive spirometer. Await final pathology 3. Supplement potassium 4. Claritin does not work for her so will stop. She can resume Zyrtec at discharge. 5. Per Dr. Cliffton Asters, if CXR stable, possible discharge later today  Laura Smith M ZimmermanPA-C 10/15/2022,7:16 AM

## 2022-10-15 NOTE — Progress Notes (Signed)
Pt being d/c, VSS, IV removed, education complete, VSS.  Balinda Quails, RN 10/15/2022 2:35 PM

## 2022-10-15 NOTE — Plan of Care (Signed)

## 2022-10-16 ENCOUNTER — Telehealth: Payer: Self-pay

## 2022-10-16 LAB — BPAM RBC
Blood Product Expiration Date: 202405112359
Blood Product Expiration Date: 202405112359

## 2022-10-16 LAB — TYPE AND SCREEN
ABO/RH(D): AB POS
Antibody Screen: NEGATIVE
Unit division: 0
Unit division: 0

## 2022-10-16 NOTE — Transitions of Care (Post Inpatient/ED Visit) (Signed)
   10/16/2022  Name: PAQUITA PRINTY MRN: [id number] DOB: 01-29-1961  Today's TOC FU Call Status: Today's TOC FU Call Status:: Unsuccessul Call (1st Attempt) Unsuccessful Call (1st Attempt) Date: 10/16/22  Attempted to reach the patient regarding the most recent Inpatient/ED visit.  Follow Up Plan: Additional outreach attempts will be made to reach the patient to complete the Transitions of Care (Post Inpatient/ED visit) call.     Antionette Fairy, RN,BSN,CCM [facility] Care Management Care Management Community Coordinator Direct Phone: [phone] Toll Free: [phone] Fax: [phone]

## 2022-10-16 NOTE — Transitions of Care (Post Inpatient/ED Visit) (Signed)
   10/16/2022  Name: Laura Smith MRN: [id number] DOB: 1961-05-01  Today's TOC FU Call Status: Today's TOC FU Call Status:: Successful TOC FU Call Competed TOC FU Call Complete Date: 10/16/22 (Incoming call from patient returning RN CM call.)  Transition Care Management Follow-up Telephone Call Date of Discharge: 10/15/22 Discharge Facility: [facility]) Type of Discharge: Inpatient Admission Primary Inpatient Discharge Diagnosis:: "pulmonary nodule" How have you been since you were released from the hospital?: Better (Pt states she is "doing okay-doing as advised and rotating pain med to keep something in her system." slept well alst night. Appetite fair. LBM today. Surgical site looks good per spouse-no s/s infection) Any questions or concerns?: No  Items Reviewed: Did you receive and understand the discharge instructions provided?: Yes Medications obtained and verified?: Yes (Medications Reviewed) Any new allergies since your discharge?: No Dietary orders reviewed?: Yes Type of Diet Ordered:: low salt/heart healthy Do you have support at home?: Yes People in Home: spouse Name of Support/Comfort Primary Source: [facility] and Equipment/Supplies: Were Home Health Services Ordered?: NA Any new equipment or medical supplies ordered?: NA  Functional Questionnaire: Do you need assistance with bathing/showering or dressing?: No Do you need assistance with meal preparation?: No Do you need assistance with eating?: No Do you have difficulty maintaining continence: No Do you need assistance with getting out of bed/getting out of a chair/moving?: No Do you have difficulty managing or taking your medications?: No  Follow up appointments reviewed: PCP Follow-up appointment confirmed?: NA Specialist Hospital Follow-up appointment confirmed?: Yes Date of Specialist follow-up appointment?: 10/23/22 Follow-Up Specialty Provider:: Dr. Cliffton Asters Do you need transportation to your  follow-up appointment?: No Do you understand care options if your condition(s) worsen?: Yes-patient verbalized understanding  SDOH Interventions Today    Flowsheet Row Most Recent Value  SDOH Interventions   Food Insecurity Interventions Intervention Not Indicated  Transportation Interventions Intervention Not Indicated      TOC Interventions Today    Flowsheet Row Most Recent Value  TOC Interventions   TOC Interventions Discussed/Reviewed TOC Interventions Discussed, Post discharge activity limitations per provider, S/S of infection, Post op wound/incision care      Interventions Today    Flowsheet Row Most Recent Value  General Interventions   General Interventions Discussed/Reviewed General Interventions Discussed, Doctor Visits  Doctor Visits Discussed/Reviewed Doctor Visits Discussed, Specialist  PCP/Specialist Visits Compliance with follow-up visit  Education Interventions   Education Provided Provided Education  Provided Verbal Education On Medication, When to see the doctor, Nutrition, Other  [pain mgmt, bowel regimen]  Nutrition Interventions   Nutrition Discussed/Reviewed Nutrition Discussed, Adding fruits and vegetables, Decreasing salt  Pharmacy Interventions   Pharmacy Dicussed/Reviewed Pharmacy Topics Discussed, Medications and their functions  Safety Interventions   Safety Discussed/Reviewed Safety Discussed       [facility] Care Management Care Management Community Coordinator Direct Phone: [phone] Toll Free: [phone] Fax: [phone]

## 2022-10-17 ENCOUNTER — Emergency Department (HOSPITAL_COMMUNITY)
Admission: EM | Admit: 2022-10-17 | Discharge: 2022-10-17 | Disposition: A | Discharge status: Home / Self Care | Payer: BC Managed Care – PPO | Attending: Emergency Medicine | Admitting: Emergency Medicine

## 2022-10-17 ENCOUNTER — Other Ambulatory Visit: Payer: Self-pay

## 2022-10-17 ENCOUNTER — Encounter (HOSPITAL_COMMUNITY): Payer: Self-pay

## 2022-10-17 ENCOUNTER — Emergency Department (HOSPITAL_COMMUNITY): Payer: BC Managed Care – PPO

## 2022-10-17 DIAGNOSIS — Z91040 Latex allergy status: Secondary | ICD-10-CM | POA: Insufficient documentation

## 2022-10-17 DIAGNOSIS — G8918 Other acute postprocedural pain: Secondary | ICD-10-CM | POA: Insufficient documentation

## 2022-10-17 DIAGNOSIS — J90 Pleural effusion, not elsewhere classified: Secondary | ICD-10-CM | POA: Diagnosis not present

## 2022-10-17 DIAGNOSIS — R9431 Abnormal electrocardiogram [ECG] [EKG]: Secondary | ICD-10-CM | POA: Diagnosis not present

## 2022-10-17 NOTE — ED Provider Notes (Signed)
[place] EMERGENCY DEPARTMENT AT [facility] Provider Note   CSN: [id number] Arrival date & time: 10/17/22  1059     History  Chief Complaint  Patient presents with   Post-Op Problems    Laura Smith is a 62 y.o. female.  Patient presents to the emergency department after having a left upper lobe lung resection for suspicious pulmonary nodule, confirmed to be non-small cell lung cancer.  Patient had her procedure on 10/14/2022.  She had the chest tube removed and she was discharged on 4/25.  X-ray at that time showed minimal bilateral periapical pneumothoraces.  Patient states that she has been doing fairly well at home.  Today she noted that during a coughing spell it felt like there were bubbles of air around the chest tube site.  She has left the bandage on this area.  No worsening shortness of breath or difficulty breathing.  She has noted some soreness to the anterior left lower chest and numbness of her skin.  She states that she did not really realize this until today.  She called the contact information she was given and spoke with the nurse, they referred her to the emergency department for evaluation.  No fevers, persistent cough, shortness of breath.      Home Medications Prior to Admission medications   Medication Sig Start Date End Date Taking? Authorizing Provider  Ascorbic Acid (VITAMIN C) 1000 MG tablet Take 1,000 mg by mouth 2 (two) times daily.    [provider]  b complex vitamins tablet Take 1 tablet by mouth daily.    [provider]  Calcium Citrate-Vitamin D (CALCIUM + D PO) Take 1 tablet by mouth every other day. Alternate days with calcium magnesium    [provider]  CALCIUM-MAG-VIT C-VIT D PO Take 1 tablet by mouth every other day. Alternate days with calcium vitamin d    [provider]  cetirizine (ZYRTEC) 10 MG tablet Take 10 mg by mouth daily.    [provider]  clobetasol ointment (TEMOVATE)  0.05 % Apply 1 Application topically 2 (two) times daily as needed. 01/13/22   [provider]  cyclobenzaprine (FLEXERIL) 10 MG tablet Take 10 mg by mouth 3 (three) times daily as needed for muscle spasms. 01/25/17   [provider]  EPINEPHrine 0.3 mg/0.3 mL IJ SOAJ injection Inject 0.3 mg into the muscle as needed for anaphylaxis. 11/11/21   Panosh, Neta Mends, MD  hydrochlorothiazide (HYDRODIURIL) 25 MG tablet TAKE 1 TABLET BY MOUTH DAILY 02/19/22   Panosh, Neta Mends, MD  ibuprofen (ADVIL) 200 MG tablet Take 600 mg by mouth every 6 (six) hours as needed for moderate pain.    [provider]  levothyroxine (SYNTHROID) 50 MCG tablet TAKE 1 TABLET DAILY 09/09/22   Panosh, Neta Mends, MD  meclizine (ANTIVERT) 25 MG tablet TAKE 2 TABLET (50 MG TOTAL) BY MOUTH 2 (TWO) TIMES DAILY AS NEEDED (FOR TRAVEL). 12/04/21   Panosh, Neta Mends, MD  Misc Natural Products (RED WINE EXTRACT PO) Take 1 tablet by mouth daily.    [provider]  Multiple Vitamins-Iron (MULTIVITAMIN/IRON) TABS Take 1 tablet by mouth daily.    [provider]  nystatin powder Apply topically 2 (two) times daily. As needed for rash 07/04/21   Genia Del, MD  oxyCODONE (OXY IR/ROXICODONE) 5 MG immediate release tablet Take 1 tablet (5 mg total) by mouth every 6 (six) hours as needed for moderate pain. 10/15/22   Doree Fudge  M, PA-C  Rimegepant Sulfate (NURTEC) 75 MG TBDP Take 1 tablet by mouth as needed (for migraine). 06/08/22   Penumalli, Glenford Bayley, MD  sulindac (CLINORIL) 200 MG tablet Take 1 tablet (200 mg total) by mouth 2 (two) times daily as needed (headache). Reported on 09/23/2015 06/12/22   Eulis Foster, FNP  verapamil (CALAN-SR) 180 MG CR tablet TAKE 1 TABLET BY MOUTH AT BEDTIME Patient taking differently: Take 180 mg by mouth daily. 02/19/22   Panosh, Neta Mends, MD  vitamin E 180 MG (400 UNITS) capsule Take 400 Units by mouth daily.    [provider]      Allergies    Bee  venom, Etodolac, Latex, Other, and Adhesive [tape]    Review of Systems   Review of Systems  Physical Exam Updated Vital Signs BP (!) 154/75 (BP Location: Right Arm)   Pulse 86   Temp 98.1 F (36.7 C)   Resp 19   SpO2 97%  Physical Exam Vitals and nursing note reviewed.  Constitutional:      General: She is not in acute distress.    Appearance: She is well-developed.  HENT:     Head: Normocephalic and atraumatic.     Right Ear: External ear normal.     Left Ear: External ear normal.     Nose: Nose normal.  Eyes:     Conjunctiva/sclera: Conjunctivae normal.  Cardiovascular:     Rate and Rhythm: Normal rate and regular rhythm.     Heart sounds: No murmur heard. Pulmonary:     Effort: No respiratory distress.     Breath sounds: No wheezing, rhonchi or rales.  Abdominal:     Palpations: Abdomen is soft.     Tenderness: There is no abdominal tenderness. There is no guarding or rebound.  Musculoskeletal:     Cervical back: Normal range of motion and neck supple.     Right lower leg: No edema.     Left lower leg: No edema.  Skin:    General: Skin is warm and dry.     Findings: No rash.     Comments: Postop wounds left anterior and lateral chest, closed with Dermabond.  These appear clean dry and intact.  Her chest tube wound has a piece of gauze over it covered with Tegaderm.  This appears normal.  Neurological:     General: No focal deficit present.     Mental Status: She is alert. Mental status is at baseline.     Motor: No weakness.  Psychiatric:        Mood and Affect: Mood normal.    ED Results / Procedures / Treatments   Labs (all labs ordered are listed, but only abnormal results are displayed) Labs Reviewed - No data to display  EKG None  Radiology DG Chest 2 View  Result Date: 10/15/2022 CLINICAL DATA:  Chest tube removal EXAM: CHEST - 2 VIEW COMPARISON:  Chest x-ray dated October 15, 2018 FINDINGS: Cardiac and mediastinal contours are unchanged. Both  lungs are clear. No pleural effusion or pneumothorax. Interval removal of left-sided chest tube. IMPRESSION: Status post left chest tube removal. Stable trace bilateral pneumothoraces. Electronically Signed   By: Allegra Lai M.D.   On: 10/15/2022 15:21    Procedures Procedures    Medications Ordered in ED Medications - No data to display  ED Course/ Medical Decision Making/ A&P    Patient seen and examined. History obtained directly from patient.   Labs/EKG: Ordered EKG showing  pre-existing left bundle branch block..  Imaging: Ordered chest 2 view.  Medications/Fluids: None ordered  Most recent vital signs reviewed and are as follows: BP (!) 154/75 (BP Location: Right Arm)   Pulse 86   Temp 98.1 F (36.7 C)   Resp 19   SpO2 97%   Initial impression: Encounter to ensure no worsening pneumothorax.  Patient also has a patch of numbness to the left anterior chest, likely related to her recent procedure.  No other strokelike symptoms.  Encouraged her to talk with her surgeon about this.  2:43 PM Reassessment performed. Patient appears well.  No distress.  No increased work of breathing, shortness of breath, or hypoxia while waiting in the emergency department.  Imaging personally visualized and interpreted including: Chest x-ray.  This was compared to chest x-ray performed just prior to discharge after chest tube was pulled.  Patient with minimal bilateral apical pneumothoraces which do not appear changed.  No signs of pneumonia.  She does have a small pleural effusion which is not unexpected postop.  Reviewed pertinent lab work and imaging with patient at bedside. Questions answered.   I discussed the case and imaging findings with Dr. Durwin Nora who agrees with plan to discharge.  Most current vital signs reviewed and are as follows: BP 139/67   Pulse 85   Temp 98.1 F (36.7 C)   Resp 19   SpO2 97%   Plan: Discharge to home.    Prescriptions written for: None  Other  home care instructions discussed: Follow postop instructions given by your surgeon  ED return instructions discussed: Return if you have worsening pain, shortness of breath, trouble breathing, cough or fever.  Follow-up instructions discussed: Patient encouraged to follow-up with their surgeon as planned.  She has her first follow-up in 6 days.                            Medical Decision Making  Patient with a gurgling sensation at the chest tube site after coughing today.  She did not have acutely worsening pain or shortness of breath.  No drainage from the chest tube site.  Her other incisions appear to be clean dry and intact.  She is not in any discomfort.  She does have some numbness on her anterior left abdomen.  Patient was evaluated with repeat chest x-ray which is stable from discharge.  She does not appear to have a worsening pneumothorax.  She does not appear to have wound infection.  Feel that she is safe for discharge to follow-up with her surgeon.  The patient's vital signs, pertinent lab work and imaging were reviewed and interpreted as discussed in the ED course. Hospitalization was considered for further testing, treatments, or serial exams/observation. However as patient is well-appearing, has a stable exam, and reassuring studies today, I do not feel that they warrant admission at this time. This plan was discussed with the patient who verbalizes agreement and comfort with this plan and seems reliable and able to return to the Emergency Department with worsening or changing symptoms.          Final Clinical Impression(s) / ED Diagnoses Final diagnoses:  Post-operative pain    Rx / DC Orders ED Discharge Orders     None         Renne Crigler, PA-C 10/17/22 1447    Gloris Manchester, MD 10/17/22 1540

## 2022-10-17 NOTE — ED Triage Notes (Signed)
Pt came in via POV d/t feeling "bubbles" come out of the area she had a chest tube at on her Lt side from being d/c 2 days ago from a lobectomy. Also reports being numb under her Lt breast as well being a new development. A/Ox4, denies new pain while in triage.

## 2022-10-17 NOTE — Discharge Instructions (Signed)
Your x-ray performed today was stable from when you were just discharged from the hospital.  There are very small amounts of air outside of the lungs and no signs of pneumonia.  There is no need for further monitoring or placement of a chest tube today.  Please continue to monitor your symptoms.  If you have worsening pain or shortness of breath, return to the emergency department or call your chest surgeon immediately.  If you have worsening fever or cough, call your doctor for further instructions.

## 2022-10-18 ENCOUNTER — Encounter (HOSPITAL_COMMUNITY): Payer: Self-pay | Admitting: Pulmonary Disease

## 2022-10-20 ENCOUNTER — Telehealth: Payer: Self-pay

## 2022-10-20 NOTE — Patient Outreach (Addendum)
  Care Coordination   Follow Up Visit Note   10/20/2022 Name: Laura Smith MRN: [id number] DOB: [birth year]  24 hr Nurse Line    Synopsis of Call: "Request for medical assistance received from female patient.  Outreach made and patient reports that she had her chest tube removed on 10/15/2022 and was discharged that same day.  Today, she notes bubbles at incision site, like air, when coughing.  She denies any other concern or symptom including shortness of breath, fever, or worsened pain.  She notes that she does have some soreness from operation that she had but nothing more than what is expected.  She also states that incision site does not have any signs or symptoms of infection.  Per patient, was not given any specific discharge instructions that explained what to report or be concerned about other than ensuring incision does not get infected.  Nurse advised that some drainage of fluid from incision site is expected due to body removing it from lungs.  Patient, however, states that it was more like air.  Recommendations given to go to ED or Urgent care for further evaluation to ensure no complications or issues, per protocol.  Patient verbalized understanding of instructions provided."  Outreach call to patient to follow up on call to nurse line. Patient states she is doing okay. She did go to ED as previously advised by on-call nurse. All work up in the ED was unremarkable. Patient states the "gurgling sound" to site has stopped. She voices that she still feels overall concerned about how she is doing. Encouraged pt to contact surgeon office to discuss her case further and to see if they recommend she move her follow up appt to be seen sooner. She voiced understanding and will do so.   Care Coordination Interventions:  Yes, provided   TOC Interventions Today    Flowsheet Row Most Recent Value  TOC Interventions   TOC Interventions Discussed/Reviewed TOC Interventions Reviewed, Post  discharge activity limitations per provider, Post op wound/incision care, S/S of infection      Interventions Today    Flowsheet Row Most Recent Value  General Interventions   General Interventions Discussed/Reviewed General Interventions Reviewed, Doctor Visits  Doctor Visits Discussed/Reviewed Doctor Visits Reviewed, PCP, Specialist  PCP/Specialist Visits Compliance with follow-up visit  Education Interventions   Education Provided Provided Education  Provided Verbal Education On When to see the doctor, Medication  Pharmacy Interventions   Pharmacy Dicussed/Reviewed Pharmacy Topics Reviewed, Medications and their functions        Follow up plan:  Patient advised to contact 24hr Nurse Line as needed.    Encounter Outcome:  Pt. Visit Completed    Alessandra Grout [facility] Care Management Care Management Community Coordinator Direct Phone: [phone] Toll Free: [phone] Fax: [phone]

## 2022-10-22 ENCOUNTER — Other Ambulatory Visit: Payer: Self-pay

## 2022-10-22 NOTE — Progress Notes (Signed)
The proposed treatment discussed in conference is for discussion purpose only and is not a binding recommendation.  The patients have not been physically examined, or presented with their treatment options.  Therefore, final treatment plans cannot be decided.  

## 2022-10-22 NOTE — Progress Notes (Signed)
[address]       [place] [address]             [phone]        Laura Smith [facility] Medical Record #[id number] Date of Birth: September 30, 1960  Referring: Josephine Igo, DO Primary Care: Madelin Headings, MD Primary Cardiologist:None  Reason for visit:   follow-up  History of Present Illness:     62yo female presents for her 1 week follow-up.  Overall she is doing well.  She is not having much pain, and denies any shortness of breath.  Physical Exam: There were no vitals taken for this visit.  Alert NAD Incision clean.   Abdomen, ND no peripheral edema   Diagnostic Studies & Laboratory data:  Path:  FINAL MICROSCOPIC DIAGNOSIS:  A. LUNG, LEFT UPPER LOBE, WEDGE RESECTION:      Invasive adenocarcinoma, lepidic (60%), papillary (30%) and acinar (10%) patterns.      Tumor size: 1.3 x 1.0 x 0.7 cm.      No pleural invasion identified.      No lymphovascular invasion identified.      Parenchymal margin is negative for carcinoma (see part K for final margin status).      See oncology table.  B. LYMPH NODE, LEVEL 9, EXCISION:      One lymph node, negative for metastatic carcinoma (0/1).  C. LYMPH NODE, HILAR, EXCISION:      One lymph node, negative for metastatic carcinoma (0/1).  D. LYMPH NODE, HILAR #2, EXCISION:      One lymph node, negative for metastatic carcinoma (0/1).  E. LYMPH NODE, LEVEL #6, EXCISION:      One lymph node, negative for metastatic carcinoma (0/1).  F. LYMPH NODE, HILAR #3, EXCISION:      One lymph node, negative for metastatic carcinoma (0/1).  G. LYMPH NODE, HILAR #4, EXCISION:      One lymph node, negative for metastatic carcinoma (0/1).  H. LYMPH NODE, HILAR #5, EXCISION:      One lymph node, negative for metastatic carcinoma (0/1).  I. LYMPH NODE, HILAR #6, EXCISION:      One lymph node, negative for metastatic carcinoma (0/1).  J. LYMPH NODE, LEVEL 7, EXCISION:      One lymph node, negative for  metastatic carcinoma (0/1).  K. LUNG, LEFT UPPER LOBE, LOBECTOMY:      Benign pulmonary parenchyma with subpleural scarring.      Three lymph nodes, negative for metastatic carcinoma (0/3).      Surgical margins of resection are negative for carcinoma.      Negative for malignancy.  INTRAOPERATIVE CONSULTATION:  A.  LUL wedge stitch marks nodule: "Adenocarcinoma" Intraoperative diagnosis rendered by Dr. Kenard Gower at 9:43 AM on 10/14/2022.  ONCOLOGY TABLE:  LUNG: Resection  Synchronous Tumors: Not applicable Total Number of Primary Tumors: 1 Procedure: Specimen Laterality: Left Tumor Focality:Unifocal Tumor Site: Left upper lobe Tumor Size: 1.3 x 1.0 x 0.7 cm      Total Tumor Size: 1.3 x 1.0 x 0.7 cm      Invasive Tumor Size (applies only to invasive nonmucinous adenocarcinoma with a lepidic           component): NA Histologic Type: Adenocarcinoma Visceral Pleura Invasion: Not identified Direct Invasion of Adjacent Structures: No adjacent structures present Lymphovascular Invasion: Not identified Margins: All margins negative for invasive carcinoma      Closest Margin(s) to Invasive Carcinoma: 6.0 cm to bronchial / vascular  margins      Margin(s) Involved by Invasive Carcinoma: Not applicable       Margin Status for Non-Invasive Tumor: Not applicable Treatment Effect: No known presurgical therapy      Percentage of Residual Viable Tumor: NA Regional Lymph Nodes: 12      Number of Lymph Nodes Involved: 0                           Nodal Sites with Tumor: 0      Number of Lymph Nodes Examined: 12                      Nodal Sites Examined: Hilar, level #2, #3, #4, #5, #6 Distant Metastasis:      Distant Site(s) Involved: Not applicable Pathologic Stage Classification (pTNM, AJCC 8th Edition): pT1b, pN0     Assessment / Plan:   61yo female with T1bN0M0 adenocarcinoma of the left upper lobe.  She will referred to medical oncology.  She will follow-up in 1 month with me.      Corliss Skains 10/22/2022 1:12 PM

## 2022-10-23 ENCOUNTER — Ambulatory Visit (INDEPENDENT_AMBULATORY_CARE_PROVIDER_SITE_OTHER): Payer: Self-pay | Admitting: Thoracic Surgery (Cardiothoracic Vascular Surgery)

## 2022-10-23 VITALS — BP 134/81 | HR 84 | Resp 20 | Ht 67.0 in | Wt 219.0 lb

## 2022-10-23 DIAGNOSIS — Z9889 Other specified postprocedural states: Secondary | ICD-10-CM

## 2022-11-13 ENCOUNTER — Other Ambulatory Visit: Payer: Self-pay

## 2022-11-13 ENCOUNTER — Ambulatory Visit
Admission: RE | Admit: 2022-11-13 | Discharge: 2022-11-13 | Disposition: A | Discharge status: Home / Self Care | Payer: BC Managed Care – PPO | Source: Ambulatory Visit | Attending: Thoracic Surgery (Cardiothoracic Vascular Surgery) | Admitting: Thoracic Surgery (Cardiothoracic Vascular Surgery)

## 2022-11-13 DIAGNOSIS — J90 Pleural effusion, not elsewhere classified: Secondary | ICD-10-CM | POA: Diagnosis not present

## 2022-11-13 DIAGNOSIS — Z9889 Other specified postprocedural states: Secondary | ICD-10-CM

## 2022-11-13 NOTE — Progress Notes (Signed)
Patient contacted the office with concerns of a dry "hacking" cough that has persisted despite taking medication. She states that she is not short of breath usually and she does not having any swelling. She is s/p RATS LUL wedge with Dr. Cliffton Asters 4/24. Advised for patient to go to [facility] imaging- [employer] for a chest xray, will make Dr. Cliffton Asters aware and discuss results with patient. She acknowledged receipt.  Also, discussed pain she has been experiencing. Says that it is sore-hurts, like a "band" is right under her breast. Advised that she is to continue to take the Ibuprofen and Tylenol as instructed by Dr. Cliffton Asters and that she needs to continue to move daily with increased activity daily. She acknowledged receipt.

## 2022-11-17 DIAGNOSIS — Z6833 Body mass index (BMI) 33.0-33.9, adult: Secondary | ICD-10-CM | POA: Diagnosis not present

## 2022-11-17 DIAGNOSIS — M47816 Spondylosis without myelopathy or radiculopathy, lumbar region: Secondary | ICD-10-CM | POA: Diagnosis not present

## 2022-11-25 ENCOUNTER — Inpatient Hospital Stay: Payer: BC Managed Care – PPO

## 2022-11-25 ENCOUNTER — Other Ambulatory Visit: Payer: Self-pay | Admitting: Medical Oncology

## 2022-11-25 ENCOUNTER — Other Ambulatory Visit: Payer: Self-pay | Admitting: Thoracic Surgery (Cardiothoracic Vascular Surgery)

## 2022-11-25 ENCOUNTER — Inpatient Hospital Stay: Payer: BC Managed Care – PPO | Attending: Internal Medicine | Admitting: Internal Medicine

## 2022-11-25 ENCOUNTER — Other Ambulatory Visit: Payer: Self-pay

## 2022-11-25 VITALS — BP 152/80 | HR 89 | Temp 98.6°F | Resp 18 | Ht 67.13 in | Wt 217.0 lb

## 2022-11-25 DIAGNOSIS — Z8042 Family history of malignant neoplasm of prostate: Secondary | ICD-10-CM | POA: Diagnosis not present

## 2022-11-25 DIAGNOSIS — C3490 Malignant neoplasm of unspecified part of unspecified bronchus or lung: Secondary | ICD-10-CM

## 2022-11-25 DIAGNOSIS — C3412 Malignant neoplasm of upper lobe, left bronchus or lung: Secondary | ICD-10-CM | POA: Diagnosis not present

## 2022-11-25 DIAGNOSIS — Z800 Family history of malignant neoplasm of digestive organs: Secondary | ICD-10-CM

## 2022-11-25 DIAGNOSIS — Z8249 Family history of ischemic heart disease and other diseases of the circulatory system: Secondary | ICD-10-CM

## 2022-11-25 DIAGNOSIS — Z825 Family history of asthma and other chronic lower respiratory diseases: Secondary | ICD-10-CM

## 2022-11-25 DIAGNOSIS — R911 Solitary pulmonary nodule: Secondary | ICD-10-CM

## 2022-11-25 DIAGNOSIS — Z803 Family history of malignant neoplasm of breast: Secondary | ICD-10-CM | POA: Diagnosis not present

## 2022-11-25 DIAGNOSIS — Z823 Family history of stroke: Secondary | ICD-10-CM

## 2022-11-25 NOTE — Progress Notes (Signed)
[facility] Telephone:([phone]) [phone]   Fax:([phone]  CONSULT NOTE  REFERRING PHYSICIAN: Dr. Brynda Greathouse  REASON FOR CONSULTATION:  62 years old white female recently diagnosed with lung cancer  HPI Laura Smith is a 62 y.o. female with past medical history significant for hypertension, basal cell carcinoma, thyroid disease, endometriosis, urinary tract infection as well as breast cyst and allergy.  The patient is a never smoker.  The patient mentions that she was seen by her cardiologist Dr. Antoine Poche for routine evaluation and because of her history of pulmonary nodule she had repeat CT scan of the chest without contrast on May 22, 2022 and that showed subsolid subpleural nodule of the medial left pulmonary apex measuring 1.2 x 1.1 cm.  This lesion was stable or at most minimally enlarged over multiple prior examination dating back to 2021 and remains morphologically and behave very suspicious for indolent adenocarcinoma.  The patient was referred to Dr. Tonia Brooms and a PET scan on 08/05/2022 showed mild FDG uptake associated with the subsolid groundglass nodule within the medial left apex.  This was suspicious for inflammatory/infectious etiology but low-grade pulmonary neoplasm such as adenocarcinoma was still a consideration.  There was no signs of tracer avid nodal metastasis or distant metastatic disease.  There was unchanged 0.4 cm solid nodule in the medial aspect of the right upper lobe.  The patient underwent bronchoscopy on 10/14/2022 by Dr. Tonia Brooms followed by robotic assisted left video thoracoscopy with left upper lobectomy and mediastinal lymph node sampling under the care of Dr. Cliffton Asters.  The final pathology ([id number]) showed invasive adenocarcinoma, lipidic 60% and papillary 30% and acinar 10% patterns.  The tumor size measured 1.3 x 1.0 x 0.7 cm with no visceral pleural or lymphovascular invasion and the dissected lymph nodes were negative for  malignancy. Dr. Cliffton Asters kindly referred the patient to me today for evaluation and recommendation regarding her condition. With seen today the patient is feeling fine with no concerning complaints except for mild cough with a tickle in her throat as well as shortness of breath with exertion.  She denied having any chest pain except for the soreness from the surgery and no hemoptysis.  She has no nausea, vomiting, diarrhea or constipation.  She has no headache or visual changes.  She denied having any weight loss but has occasional night sweats. Family history significant for father with heart disease and dyslipidemia.  Mother had colon and breast cancer.  Paternal grandmother had lung cancer. The patient is married and has 1 daughter age 51.  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  She has no history of smoking, alcohol or drug abuse.  HPI  Past Medical History:  Diagnosis Date   Allergy    Breast cyst    Endometriosis    surgery laser 2001   Family history of breast cancer    Family history of colon cancer    Family history of prostate cancer    Hemorrhoids    Hx of basal cell carcinoma    Hypertension    Migraine    Thyroid disease    Urinary tract infection     Past Surgical History:  Procedure Laterality Date   APPENDECTOMY  1992   AT C/S   BACK SURGERY  06/06/2020   L3-4   BRONCHIAL BIOPSY  10/14/2022   Procedure: BRONCHIAL BIOPSIES;  Surgeon: Josephine Igo, DO;  Location: [facility];  Service: Pulmonary;;   CESAREAN SECTION  1992  APPENDECTOMY SAME TIME   COLONOSCOPY  2015   FIDUCIAL MARKER PLACEMENT  10/14/2022   Procedure: FIDUCIAL DYE MARKING;  Surgeon: Josephine Igo, DO;  Location: [facility];  Service: Pulmonary;;   HYSTEROSCOPY  08/1999   AT TIME OF LAPAROSCOPY   INTERCOSTAL NERVE BLOCK Left 10/14/2022   Procedure: INTERCOSTAL NERVE BLOCK;  Surgeon: Corliss Skains, MD;  Location: [facility] OR;  Service: Thoracic;  Laterality: Left;    NODE DISSECTION Left 10/14/2022   Procedure: NODE DISSECTION;  Surgeon: Corliss Skains, MD;  Location: [facility] OR;  Service: Thoracic;  Laterality: Left;   PELVIC LAPAROSCOPY  08/1999   AND HYSTEROSCOPY--ENDOMETRIOSIS   POLYPECTOMY      Family History  Problem Relation Age of Onset   Breast cancer Mother        Age 26   Colon cancer Mother 11   Breast cancer Cousin 44       paternal first cousin   Heart disease Father 39       Heart attack   Hypertension Paternal Grandmother    Heart disease Paternal Grandmother    Stroke Paternal Grandmother    Cancer Paternal Grandmother        Lung   Cancer Paternal Grandfather        Prostate   Stroke Maternal Grandfather    COPD Maternal Uncle    Liver cancer Paternal Uncle    Congestive Heart Failure Maternal Grandmother    Hypertension Paternal Aunt    Colon polyps Neg Hx    Stomach cancer Neg Hx    Rectal cancer Neg Hx    Esophageal cancer Neg Hx     Social History Social History   Tobacco Use   Smoking status: Never   Smokeless tobacco: Never  Vaping Use   Vaping Use: Never used  Substance Use Topics   Alcohol use: No    Alcohol/week: 0.0 standard drinks of alcohol   Drug use: No    Allergies  Allergen Reactions   Bee Venom Anaphylaxis    Very Large local reaction, leg and sob  seen in [place] years ago    Etodolac Hives    Possible anaphylaxis  With vomiting and throat feeling tight  ED visit 8 14    Latex Hives, Rash and Other (See Comments)    Causes blisters   Other Hives, Rash and Other (See Comments)    Gel used for ultrasound-causes blisters also    Adhesive [Tape] Itching and Rash    Current Outpatient Medications  Medication Sig Dispense Refill   Ascorbic Acid (VITAMIN C) 1000 MG tablet Take 1,000 mg by mouth 2 (two) times daily.     b complex vitamins tablet Take 1 tablet by mouth daily.     Calcium Citrate-Vitamin D (CALCIUM + D PO) Take 1 tablet by mouth every other day. Alternate days  with calcium magnesium     CALCIUM-MAG-VIT C-VIT D PO Take 1 tablet by mouth every other day. Alternate days with calcium vitamin d     cetirizine (ZYRTEC) 10 MG tablet Take 10 mg by mouth daily.     clobetasol ointment (TEMOVATE) 0.05 % Apply 1 Application topically 2 (two) times daily as needed.     cyclobenzaprine (FLEXERIL) 10 MG tablet Take 10 mg by mouth 3 (three) times daily as needed for muscle spasms.     EPINEPHrine 0.3 mg/0.3 mL IJ SOAJ injection Inject 0.3 mg into the muscle as needed for anaphylaxis. 1 each 1  hydrochlorothiazide (HYDRODIURIL) 25 MG tablet TAKE 1 TABLET BY MOUTH DAILY 90 tablet 3   ibuprofen (ADVIL) 200 MG tablet Take 600 mg by mouth every 6 (six) hours as needed for moderate pain.     levothyroxine (SYNTHROID) 50 MCG tablet TAKE 1 TABLET DAILY 90 tablet 3   meclizine (ANTIVERT) 25 MG tablet TAKE 2 TABLET (50 MG TOTAL) BY MOUTH 2 (TWO) TIMES DAILY AS NEEDED (FOR TRAVEL). 30 tablet 0   Misc Natural Products (RED WINE EXTRACT PO) Take 1 tablet by mouth daily.     Multiple Vitamins-Iron (MULTIVITAMIN/IRON) TABS Take 1 tablet by mouth daily.     nystatin powder Apply topically 2 (two) times daily. As needed for rash 30 g 5   oxyCODONE (OXY IR/ROXICODONE) 5 MG immediate release tablet Take 1 tablet (5 mg total) by mouth every 6 (six) hours as needed for moderate pain. 30 tablet 0   Rimegepant Sulfate (NURTEC) 75 MG TBDP Take 1 tablet by mouth as needed (for migraine). 8 tablet 12   sulindac (CLINORIL) 200 MG tablet Take 1 tablet (200 mg total) by mouth 2 (two) times daily as needed (headache). Reported on 09/23/2015 30 tablet 5   verapamil (CALAN-SR) 180 MG CR tablet TAKE 1 TABLET BY MOUTH AT BEDTIME (Patient taking differently: Take 180 mg by mouth daily.) 90 tablet 3   vitamin E 180 MG (400 UNITS) capsule Take 400 Units by mouth daily.     Current Facility-Administered Medications  Medication Dose Route Frequency Provider Last Rate Last Admin   methylPREDNISolone  acetate (DEPO-MEDROL) injection 120 mg  120 mg Intramuscular Once Panosh, Neta Mends, MD        Review of Systems  Constitutional: positive for night sweats Eyes: negative Ears, nose, mouth, throat, and face: negative Respiratory: positive for cough, dyspnea on exertion, and pleurisy/chest pain Cardiovascular: negative Gastrointestinal: negative Genitourinary:negative Integument/breast: negative Hematologic/lymphatic: negative Musculoskeletal:negative Neurological: negative Behavioral/Psych: negative Endocrine: negative Allergic/Immunologic: negative  Physical Exam  ZOX:WRUEA, healthy, no distress, well nourished, well developed, and anxious SKIN: skin color, texture, turgor are normal, no rashes or significant lesions HEAD: Normocephalic, No masses, lesions, tenderness or abnormalities EYES: normal, PERRLA, Conjunctiva are pink and non-injected EARS: External ears normal, Canals clear OROPHARYNX:no exudate, no erythema, and lips, buccal mucosa, and tongue normal  NECK: supple, no adenopathy, no JVD LYMPH:  no palpable lymphadenopathy, no hepatosplenomegaly BREAST:not examined LUNGS: clear to auscultation , and palpation HEART: regular rate & rhythm, no murmurs, and no gallops ABDOMEN:abdomen soft, non-tender, normal bowel sounds, and no masses or organomegaly BACK: Back symmetric, no curvature., No CVA tenderness EXTREMITIES:no joint deformities, effusion, or inflammation, no edema  NEURO: alert & oriented x 3 with fluent speech, no focal motor/sensory deficits  PERFORMANCE STATUS: ECOG 1  LABORATORY DATA: Lab Results  Component Value Date   WBC 12.9 (H) 10/15/2022   HGB 12.2 10/15/2022   HCT 35.4 (L) 10/15/2022   MCV 86.3 10/15/2022   PLT 295 10/15/2022      Chemistry      Component Value Date/Time   NA 135 10/15/2022 0033   K 3.8 10/15/2022 0033   CL 97 (L) 10/15/2022 0033   CO2 28 10/15/2022 0033   BUN 13 10/15/2022 0033   CREATININE 1.03 (H) 10/15/2022 0033    CREATININE 0.93 07/04/2021 1443   CREATININE 0.94 05/24/2020 0914      Component Value Date/Time   CALCIUM 8.5 (L) 10/15/2022 0033   ALKPHOS 59 10/12/2022 1024   AST 23 10/12/2022 1024  AST 20 07/04/2021 1443   ALT 19 10/12/2022 1024   ALT 17 07/04/2021 1443   BILITOT 0.5 10/12/2022 1024   BILITOT 0.4 07/04/2021 1443       RADIOGRAPHIC STUDIES: DG Chest 2 View  Result Date: 11/13/2022 CLINICAL DATA:  Cough.  History of left upper lobectomy a month ago. EXAM: CHEST - 2 VIEW COMPARISON:  Chest x-ray dated October 17, 2022. FINDINGS: The heart size and mediastinal contours are within normal limits. Unchanged small left pleural effusion. Tiny bilateral pneumothoraces have resolved. No acute osseous abnormality. IMPRESSION: 1. Unchanged small left pleural effusion. Tiny bilateral pneumothoraces have resolved. Electronically Signed   By: Obie Dredge M.D.   On: 11/13/2022 15:30    ASSESSMENT: This is a very pleasant 62 years old white female recently diagnosed with a stage Ia (T1b, N0, M0) non-small cell lung cancer, adenocarcinoma status post left upper lobectomy with lymph node sampling under the care of Dr. Cliffton Asters on October 11, 2022 with tumor size of 1.3 cm.   PLAN: I had a lengthy discussion with the patient today about her current disease stage, prognosis and treatment options.  I personally and independently reviewed her imaging studies as well as the pathology report. I explained to the patient that she had curable treatment for her condition with the surgical resection. I also explained to the patient that there is no survival benefit for adjuvant systemic chemotherapy or radiation for patient with a stage Ia non-small cell lung cancer and the current standard of care is observation and close monitoring. I recommended for her to come back for follow-up visit in 6 months for evaluation with repeat CT scan of the chest. For the dry cough she was advised to reach out to Dr. Tonia Brooms  for evaluation and recommendation regarding her condition. The patient was advised to call immediately if she has any concerning symptoms in the interval. The patient voices understanding of current disease status and treatment options and is in agreement with the current care plan.  All questions were answered. The patient knows to call the clinic with any problems, questions or concerns. We can certainly see the patient much sooner if necessary.  Thank you so much for allowing me to participate in the care of Laura Smith. I will continue to follow up the patient with you and assist in her care.  The total time spent in the appointment was 60 minutes.  Disclaimer: This note was dictated with voice recognition software. Similar sounding words can inadvertently be transcribed and may not be corrected upon review.   Lajuana Matte November 25, 2022, 11:57 AM

## 2022-11-27 ENCOUNTER — Ambulatory Visit (INDEPENDENT_AMBULATORY_CARE_PROVIDER_SITE_OTHER): Payer: Self-pay | Admitting: Thoracic Surgery (Cardiothoracic Vascular Surgery)

## 2022-11-27 ENCOUNTER — Ambulatory Visit
Admission: RE | Admit: 2022-11-27 | Discharge: 2022-11-27 | Disposition: A | Discharge status: Home / Self Care | Payer: BC Managed Care – PPO | Source: Ambulatory Visit | Attending: Thoracic Surgery (Cardiothoracic Vascular Surgery) | Admitting: Thoracic Surgery (Cardiothoracic Vascular Surgery)

## 2022-11-27 ENCOUNTER — Ambulatory Visit: Payer: BC Managed Care – PPO | Admitting: Thoracic Surgery (Cardiothoracic Vascular Surgery)

## 2022-11-27 ENCOUNTER — Encounter: Payer: Self-pay | Admitting: Thoracic Surgery (Cardiothoracic Vascular Surgery)

## 2022-11-27 VITALS — BP 134/74 | HR 105 | Resp 20 | Ht 67.0 in | Wt 217.0 lb

## 2022-11-27 DIAGNOSIS — C3412 Malignant neoplasm of upper lobe, left bronchus or lung: Secondary | ICD-10-CM

## 2022-11-27 DIAGNOSIS — J986 Disorders of diaphragm: Secondary | ICD-10-CM | POA: Diagnosis not present

## 2022-11-27 DIAGNOSIS — R911 Solitary pulmonary nodule: Secondary | ICD-10-CM

## 2022-11-27 MED ORDER — PREGABALIN 25 MG PO CAPS: 25.0000 mg | ORAL_CAPSULE | Freq: Two times a day (BID) | ORAL | 0 refills | Status: DC

## 2022-11-27 NOTE — Progress Notes (Signed)
      [address]       [place] [phone]             [phone]        Laura Smith [facility] Medical Record #[id number] Date of Birth: 1961/01/31  Referring: Josephine Igo, DO Primary Care: Madelin Headings, MD Primary Cardiologist:None  Reason for visit:   follow-up  History of Present Illness:     62 year old female presents in follow-up.  She complains of chest wall pain and paresthesias.  She denies any shortness of breath.  Physical Exam: BP 134/74 (BP Location: Left Arm, Patient Position: Sitting, Cuff Size: Normal)   Pulse (!) 105   Resp 20   Ht 5\' 7"  (1.702 m)   Wt 217 lb (98.4 kg)   SpO2 93% Comment: RA  BMI 33.99 kg/m   Alert NAD Abdomen, ND No peripheral edema   Diagnostic Studies & Laboratory data: CXR: Clear     Assessment / Plan:   62yo female with T1bN0M0 adenocarcinoma of the left upper lobe.  I am giving her prescription for Lyrica.  I will follow-up with her virtually in a month.  She would like to return to work in mid August.   Corliss Skains 11/27/2022 12:15 PM

## 2022-12-13 NOTE — Progress Notes (Unsigned)
  Cardiology Office Note:   Date:  12/15/2022  ID:  Laura Smith, DOB 1961/06/06, MRN [id number] PCP: Madelin Headings, MD  [facility] Providers Cardiologist:  None {  History of Present Illness:   Laura Smith is a 62 y.o. female who presents for evaluation of chest discomfort.  She was in the hospital in 2018.    There was chest discomfort discussed.  Stress perfusion study in 2018 demonstrated no ischemia.  She had this when she was in the hospital with chest pain.    She had an echo in 2019 with normal EF.   She did rule out for myocardial infarction.  Her EKG demonstrates an interventricular conduction delay this borderline left bundle branch block that is new compared to previous.  She was found to have small cell lung cancer is now s/p resection.  Prior to the procedure she had an EKG with left bundle branch block which had progressed from incomplete left bundle branch block.  She had a perfusion study which suggested possibly an old apical infarct but there was no evidence of ischemia and the ejection fraction is well-preserved.  This was preop and she did well with surgery.  She came back today to evaluate left bundle branch block in the abnormal stress perfusion study.  It was a low risk study result.     The patient denies any new symptoms such as chest discomfort, neck or arm discomfort. There has been no new shortness of breath, PND or orthopnea. There have been no reported palpitations, presyncope or syncope.     ROS: As stated in the HPI and negative for all other systems.  Studies Reviewed:    EKG:   10/17/2022 left bundle branch block this EKG of the limb lead reversal  Risk Assessment/Calculations:     Physical Exam:   VS:  BP (!) 132/90   Pulse 90   Ht 5\' 7"  (1.702 m)   Wt 220 lb 6.4 oz (100 kg)   SpO2 97%   BMI 34.52 kg/m    Wt Readings from Last 3 Encounters:  12/15/22 220 lb 6.4 oz (100 kg)  11/27/22 217 lb (98.4 kg)  11/25/22 217 lb (98.4 kg)      GEN: Well nourished, well developed in no acute distress NECK: No JVD; No carotid bruits CARDIAC: RRR, no murmurs, rubs, gallops RESPIRATORY:  Clear to auscultation without rales, wheezing or rhonchi  ABDOMEN: Soft, non-tender, non-distended EXTREMITIES:  No edema; No deformity   ASSESSMENT AND PLAN:   Abnormal stress Test: The patient did have an apical defect which could be an old infarct.  Her, she is not having any symptoms.  She has a well-preserved ejection fraction.  Given this no further cardiovascular testing is suggested.  She should continue with risk reduction.  HTN: Blood pressure is mildly elevated and she should keep a blood pressure diary at home with goal is 120s over 70s.   Dyslipidemia: The patient does have a mildly elevated LDL.  However, she would prefer not to take medicine.  Of note I do not see any coronary calcium reported on her multiple CTs.  However, we talked about a plant-based diet.       Follow up me as needed.    Signed, Rollene Rotunda, MD

## 2022-12-15 ENCOUNTER — Encounter: Payer: Self-pay | Admitting: Cardiology

## 2022-12-15 ENCOUNTER — Ambulatory Visit: Payer: BC Managed Care – PPO | Attending: Cardiology | Admitting: Cardiology

## 2022-12-15 VITALS — BP 132/90 | HR 90 | Ht 67.0 in | Wt 220.4 lb

## 2022-12-15 DIAGNOSIS — I10 Essential (primary) hypertension: Secondary | ICD-10-CM

## 2022-12-15 DIAGNOSIS — R072 Precordial pain: Secondary | ICD-10-CM | POA: Diagnosis not present

## 2022-12-15 NOTE — Patient Instructions (Signed)
    Follow-Up: At [facility], you and your health needs are our priority.  As part of our continuing mission to provide you with exceptional heart care, we have created designated Provider Care Teams.  These Care Teams include your primary Cardiologist (physician) and Advanced Practice Providers (APPs -  Physician Assistants and Nurse Practitioners) who all work together to provide you with the care you need, when you need it.  We recommend signing up for the patient portal called "MyChart".  Sign up information is provided on this After Visit Summary.  MyChart is used to connect with patients for Virtual Visits (Telemedicine).  Patients are able to view lab/test results, encounter notes, upcoming appointments, etc.  Non-urgent messages can be sent to your provider as well.   To learn more about what you can do with MyChart, go to [email or web address].    Your next appointment:   As needed        

## 2022-12-28 ENCOUNTER — Other Ambulatory Visit: Payer: Self-pay

## 2023-01-01 ENCOUNTER — Telehealth: Payer: Self-pay | Admitting: Thoracic Surgery (Cardiothoracic Vascular Surgery)

## 2023-01-08 ENCOUNTER — Ambulatory Visit (INDEPENDENT_AMBULATORY_CARE_PROVIDER_SITE_OTHER): Payer: Self-pay | Admitting: Thoracic Surgery (Cardiothoracic Vascular Surgery)

## 2023-01-08 DIAGNOSIS — C3412 Malignant neoplasm of upper lobe, left bronchus or lung: Secondary | ICD-10-CM

## 2023-01-08 NOTE — Progress Notes (Signed)
     [address]       [place] [address]             [phone]       Patient: Home Provider: Office Consent for Telemedicine visit obtained.  Today's visit was completed via a real-time telehealth (see specific modality noted below). The patient/authorized person provided oral consent at the time of the visit to engage in a telemedicine encounter with the present provider at [facility]. The patient/authorized person was informed of the potential benefits, limitations, and risks of telemedicine. The patient/authorized person expressed understanding that the laws that protect confidentiality also apply to telemedicine. The patient/authorized person acknowledged understanding that telemedicine does not provide emergency services and that he or she would need to call 911 or proceed to the nearest hospital for help if such a need arose.   Total time spent in the clinical discussion 10 minutes.  Telehealth Modality: Phone visit (audio only)  I had a telephone visit with Mrs. Laura Smith.  She is s/p lobectomy.  Her pain is much improved.  She has follow-up with medical oncology.  She will follow-up as needed.  Izyk Marty Keane Scrape

## 2023-01-19 DIAGNOSIS — M47816 Spondylosis without myelopathy or radiculopathy, lumbar region: Secondary | ICD-10-CM | POA: Diagnosis not present

## 2023-01-22 DIAGNOSIS — Z1231 Encounter for screening mammogram for malignant neoplasm of breast: Secondary | ICD-10-CM | POA: Diagnosis not present

## 2023-01-26 ENCOUNTER — Encounter: Payer: Self-pay | Admitting: Radiology

## 2023-02-03 ENCOUNTER — Other Ambulatory Visit: Payer: Self-pay | Admitting: Diagnostic Neuroimaging

## 2023-02-03 DIAGNOSIS — G43109 Migraine with aura, not intractable, without status migrainosus: Secondary | ICD-10-CM

## 2023-02-17 ENCOUNTER — Other Ambulatory Visit: Payer: Self-pay | Admitting: Internal Medicine

## 2023-03-20 ENCOUNTER — Other Ambulatory Visit: Payer: Self-pay

## 2023-03-20 ENCOUNTER — Emergency Department (HOSPITAL_BASED_OUTPATIENT_CLINIC_OR_DEPARTMENT_OTHER)
Admission: EM | Admit: 2023-03-20 | Discharge: 2023-03-20 | Disposition: A | Discharge status: Home / Self Care | Payer: BC Managed Care – PPO | Attending: Emergency Medicine | Admitting: Emergency Medicine

## 2023-03-20 ENCOUNTER — Encounter (HOSPITAL_BASED_OUTPATIENT_CLINIC_OR_DEPARTMENT_OTHER): Payer: Self-pay | Admitting: Emergency Medicine

## 2023-03-20 ENCOUNTER — Emergency Department (HOSPITAL_BASED_OUTPATIENT_CLINIC_OR_DEPARTMENT_OTHER): Payer: BC Managed Care – PPO

## 2023-03-20 DIAGNOSIS — Z85118 Personal history of other malignant neoplasm of bronchus and lung: Secondary | ICD-10-CM | POA: Diagnosis not present

## 2023-03-20 DIAGNOSIS — R200 Anesthesia of skin: Secondary | ICD-10-CM | POA: Insufficient documentation

## 2023-03-20 DIAGNOSIS — R0602 Shortness of breath: Secondary | ICD-10-CM | POA: Diagnosis not present

## 2023-03-20 DIAGNOSIS — Z91040 Latex allergy status: Secondary | ICD-10-CM | POA: Diagnosis not present

## 2023-03-20 DIAGNOSIS — R0789 Other chest pain: Secondary | ICD-10-CM | POA: Insufficient documentation

## 2023-03-20 DIAGNOSIS — R079 Chest pain, unspecified: Secondary | ICD-10-CM | POA: Diagnosis not present

## 2023-03-20 LAB — CBC
HCT: 42.3 % (ref 36.0–46.0)
Hemoglobin: 14.6 g/dL (ref 12.0–15.0)
MCH: 29.4 pg (ref 26.0–34.0)
MCHC: 34.5 g/dL (ref 30.0–36.0)
MCV: 85.3 fL (ref 80.0–100.0)
Platelets: 352 10*3/uL (ref 150–400)
RBC: 4.96 MIL/uL (ref 3.87–5.11)
RDW: 13.1 % (ref 11.5–15.5)
WBC: 11.6 10*3/uL — ABNORMAL HIGH (ref 4.0–10.5)
nRBC: 0 % (ref 0.0–0.2)

## 2023-03-20 LAB — TROPONIN I (HIGH SENSITIVITY)
Troponin I (High Sensitivity): 6 ng/L (ref <18)
Troponin I (High Sensitivity): 5 ng/L (ref <18)

## 2023-03-20 LAB — BASIC METABOLIC PANEL
Anion gap: 9 (ref 5–15)
BUN: 13 mg/dL (ref 8–23)
CO2: 31 mmol/L (ref 22–32)
Calcium: 9.7 mg/dL (ref 8.9–10.3)
Chloride: 99 mmol/L (ref 98–111)
Creatinine, Ser: 0.87 mg/dL (ref 0.44–1.00)
GFR, Estimated: >60 mL/min (ref >60)
Glucose, Bld: 120 mg/dL — ABNORMAL HIGH (ref 70–99)
Potassium: 3.2 mmol/L — ABNORMAL LOW (ref 3.5–5.1)
Sodium: 139 mmol/L (ref 135–145)

## 2023-03-20 MED ORDER — ALUM & MAG HYDROXIDE-SIMETH 200-200-20 MG/5ML PO SUSP
30.0000 mL | Freq: Once | ORAL | Status: AC
  Administered 2023-03-20: 30 mL via ORAL
  Filled 2023-03-20: qty 30

## 2023-03-20 MED ORDER — SODIUM CHLORIDE 0.9% FLUSH: 3.0000 mL | Freq: Once | INTRAVENOUS | Status: DC

## 2023-03-20 NOTE — ED Provider Notes (Signed)
[place] EMERGENCY DEPARTMENT AT [facility] Provider Note   CSN: [id number] Arrival date & time: 03/20/23  2003     History  Chief Complaint  Patient presents with   Chest Pain    Laura Smith is a 62 y.o. female.  Patient with history of stage I lung cancer status post lobectomy, history of hypertension, history of chest pain, LBBB --presents with approximately 1 week of questionable heartburn symptoms.  Patient states that she has not had heartburn in the past but has had some generalized chest tightness, described as 8 out of 10 in intensity, with frequent belching.  Specifically tonight, she developed right arm "numbness" described as a decree sensation in her right arm without shoulder pain or neck pain starting around 6:30 PM.  This began at rest.  She has shortness of breath but describes this as feeling like she needs to take a deep breath.  No increasing pain with deep breathing.  She has not had any other exertional symptoms.  She got some Nexium but has not started taking this yet.  No fevers or cough.  No lower extremity swelling.  Patient denies risk factors for pulmonary embolism including: unilateral leg swelling, history of DVT/PE/other blood clots, use of exogenous hormones, recent immobilizations, recent surgery, recent travel (>4hr segment), active malignancy, hemoptysis.          Home Medications Prior to Admission medications   Medication Sig Start Date End Date Taking? Authorizing Provider  Ascorbic Acid (VITAMIN C) 1000 MG tablet Take 1,000 mg by mouth 2 (two) times daily.    [provider]  b complex vitamins tablet Take 1 tablet by mouth daily.    [provider]  Calcium Citrate-Vitamin D (CALCIUM + D PO) Take 1 tablet by mouth every other day. Alternate days with calcium magnesium    [provider]  CALCIUM-MAG-VIT C-VIT D PO Take 1 tablet by mouth every other day. Alternate days with calcium vitamin d    [provider]  cetirizine (ZYRTEC) 10 MG tablet Take 10 mg by mouth daily.    [provider]  clobetasol ointment (TEMOVATE) 0.05 % Apply 1 Application topically 2 (two) times daily as needed. 01/13/22   [provider]  cyclobenzaprine (FLEXERIL) 10 MG tablet Take 10 mg by mouth 3 (three) times daily as needed for muscle spasms. 01/25/17   [provider]  EPINEPHrine 0.3 mg/0.3 mL IJ SOAJ injection Inject 0.3 mg into the muscle as needed for anaphylaxis. 11/11/21   Panosh, Neta Mends, MD  hydrochlorothiazide (HYDRODIURIL) 25 MG tablet TAKE 1 TABLET BY MOUTH DAILY 02/17/23   Panosh, Neta Mends, MD  ibuprofen (ADVIL) 200 MG tablet Take 600 mg by mouth every 6 (six) hours as needed for moderate pain.    [provider]  levothyroxine (SYNTHROID) 50 MCG tablet TAKE 1 TABLET DAILY 09/09/22   Panosh, Neta Mends, MD  meclizine (ANTIVERT) 25 MG tablet TAKE 2 TABLET (50 MG TOTAL) BY MOUTH 2 (TWO) TIMES DAILY AS NEEDED (FOR TRAVEL). 12/04/21   Panosh, Neta Mends, MD  Misc Natural Products (RED WINE EXTRACT PO) Take 1 capsule by mouth daily. Red wine extract    [provider]  Multiple Vitamins-Iron (MULTIVITAMIN/IRON) TABS Take 1 tablet by mouth daily.    [provider]  NURTEC 75 MG TBDP TAKE 1 TABLET BY MOUTH AS NEEDED FOR MIGRAINES 02/03/23   Penumalli, Glenford Bayley, MD  nystatin powder Apply topically 2 (two) times daily. As needed  for rash 07/04/21   Genia Del, MD  oxyCODONE (OXY IR/ROXICODONE) 5 MG immediate release tablet Take 1 tablet (5 mg total) by mouth every 6 (six) hours as needed for moderate pain. 10/15/22   Ardelle Balls, PA-C  pregabalin (LYRICA) 25 MG capsule Take 1 capsule (25 mg total) by mouth 2 (two) times daily. 11/27/22   Lightfoot, Eliezer Lofts, MD  sulindac (CLINORIL) 200 MG tablet Take 1 tablet (200 mg total) by mouth 2 (two) times daily as needed (headache). Reported on 09/23/2015 06/12/22   Eulis Foster, FNP  traMADol (ULTRAM) 50 MG  tablet Take 50 mg by mouth every 6 (six) hours as needed for moderate pain.    [provider]  verapamil (CALAN-SR) 180 MG CR tablet TAKE 1 TABLET BY MOUTH AT BEDTIME 02/17/23   Panosh, Neta Mends, MD  vitamin E 180 MG (400 UNITS) capsule Take 400 Units by mouth daily.    [provider]      Allergies    Bee venom, Etodolac, Latex, Other, and Adhesive [tape]    Review of Systems   Review of Systems  Physical Exam Updated Vital Signs BP (!) 144/72   Pulse 76   Temp 98.6 F (37 C) (Oral)   Resp 20   Ht 5\' 7"  (1.702 m)   Wt 97.5 kg   SpO2 95%   BMI 33.67 kg/m  Physical Exam Vitals and nursing note reviewed.  Constitutional:      Appearance: She is well-developed. She is not diaphoretic.  HENT:     Head: Normocephalic and atraumatic.     Mouth/Throat:     Mouth: Mucous membranes are not dry.  Eyes:     Conjunctiva/sclera: Conjunctivae normal.  Neck:     Vascular: Normal carotid pulses. No JVD.     Trachea: Trachea normal. No tracheal deviation.  Cardiovascular:     Rate and Rhythm: Normal rate and regular rhythm.     Pulses: No decreased pulses.          Radial pulses are 2+ on the right side and 2+ on the left side.     Heart sounds: Normal heart sounds, S1 normal and S2 normal. No murmur heard. Pulmonary:     Effort: Pulmonary effort is normal. No respiratory distress.     Breath sounds: No wheezing.  Chest:     Chest wall: No tenderness.  Abdominal:     General: Bowel sounds are normal.     Palpations: Abdomen is soft.     Tenderness: There is no abdominal tenderness. There is no guarding or rebound.     Comments: Patient belching frequently during exam  Musculoskeletal:        General: Normal range of motion.     Cervical back: Normal range of motion and neck supple. No muscular tenderness.     Right lower leg: No tenderness. No edema.     Left lower leg: No tenderness. No edema.     Comments: No tenderness in the neck or right shoulder area.   Patient with full range of motion without difficulties.  Skin:    General: Skin is warm and dry.     Coloration: Skin is not pale.  Neurological:     Mental Status: She is alert.     ED Results / Procedures / Treatments   Labs (all labs ordered are listed, but only abnormal results are displayed) Labs Reviewed  BASIC METABOLIC PANEL - Abnormal; Notable for the following components:  Result Value   Potassium 3.2 (*)    Glucose, Bld 120 (*)    All other components within normal limits  CBC - Abnormal; Notable for the following components:   WBC 11.6 (*)    All other components within normal limits  TROPONIN I (HIGH SENSITIVITY)  TROPONIN I (HIGH SENSITIVITY)   ED ECG REPORT   Date: 03/20/2023  Rate: 82  Rhythm: normal sinus rhythm and premature ventricular contractions (PVC)  QRS Axis: normal  Intervals: normal  ST/T Wave abnormalities: normal  Conduction Disutrbances:left bundle branch block  Narrative Interpretation:   Old EKG Reviewed: unchanged from 4/24  I have personally reviewed the EKG tracing and agree with the computerized printout as noted.    Radiology DG Chest Port 1 View  Result Date: 03/20/2023 CLINICAL DATA:  Chest pain, shortness of breath EXAM: PORTABLE CHEST 1 VIEW COMPARISON:  11/27/2022 FINDINGS: Heart and mediastinal contours are within normal limits. No focal opacities or effusions. No acute bony abnormality. IMPRESSION: No active disease. Electronically Signed   By: Charlett Nose M.D.   On: 03/20/2023 20:57    Procedures Procedures    Medications Ordered in ED Medications  sodium chloride flush (NS) 0.9 % injection 3 mL (3 mLs Intravenous Not Given 03/20/23 2040)  alum & mag hydroxide-simeth (MAALOX/MYLANTA) 200-200-20 MG/5ML suspension 30 mL (30 mLs Oral Given 03/20/23 2138)    ED Course/ Medical Decision Making/ A&P    Patient seen and examined. History obtained directly from patient. Work-up including labs, imaging, EKG ordered in  triage, if performed, were reviewed.    Labs/EKG: Independently reviewed and interpreted.  This included: EKG showing left bundle branch block unchanged from previous; CBC with slightly elevated white blood cell count 11.6 otherwise normal hemoglobin otherwise unremarkable; BMP with mild hypokalemia at 3.2, glucose of 120 with normal anion gap otherwise unremarkable; first troponin Normal at 6.  Imaging: Independently visualized and interpreted.  This included: Chest x-ray, agree negative  Medications/Fluids: Ordered: P.o. Maalox  Initial impression: Atypical chest tightness with left arm paresthesias.  11:58 PM Reassessment performed. Patient appears stable.  Labs personally reviewed and interpreted including: Second troponin normal at 5.  Reviewed pertinent lab work and imaging with patient at bedside. Questions answered.   Most current vital signs reviewed and are as follows: BP (!) 144/72   Pulse 76   Temp 98.6 F (37 C) (Oral)   Resp 20   Ht 5\' 7"  (1.702 m)   Wt 97.5 kg   SpO2 95%   BMI 33.67 kg/m   Plan: Discharge to home.   Prescriptions written for: None, but patient has Nexium and I encouraged her to take this for 2-week course and then discussed with her physician to determine if she should continue this.    Other home care instructions discussed: Avoidance of foods which make her symptoms worse, avoid lying down right after eating  Return and follow-up instructions: I encouraged patient to return to ED with severe chest pain, especially if the pain is crushing or pressure-like and spreads to the arms, back, neck, or jaw, or if they have associated sweating, vomiting, or shortness of breath with the pain, or significant pain with activity. We discussed that the evaluation here today indicates a low-risk of serious cause of chest pain, including heart trouble or a blood clot, but no evaluation is perfect and chest pain can evolve with time. The patient verbalized  understanding and agreed.  I encouraged patient to follow-up with their provider in  the next 1 week for recheck.                                   Medical Decision Making Amount and/or Complexity of Data Reviewed Labs: ordered. Radiology: ordered.  Risk OTC drugs.   For this patient's complaint of chest pain, the following emergent conditions were considered on the differential diagnosis: acute coronary syndrome, pulmonary embolism, pneumothorax, myocarditis, pericardial tamponade, aortic dissection, thoracic aortic aneurysm complication, esophageal perforation.   Other causes were also considered including: gastroesophageal reflux disease, musculoskeletal pain including costochondritis, pneumonia/pleurisy, herpes zoster, pericarditis.  Symptoms are very atypical.  Has a GI flavor to the symptoms given her associated belching which is worse when she experiences the chest tightness.  In regards to possibility of ACS, patient has atypical features of pain, non-ischemic and unchanged EKG and negative troponin(s).   In regards to possibility of PE, symptoms are atypical for PE and risk profile is low, making PE low likelihood.  No tachycardia, hypoxia or significant shortness of breath.  The patient's vital signs, pertinent lab work and imaging were reviewed and interpreted as discussed in the ED course. Hospitalization was considered for further testing, treatments, or serial exams/observation. However as patient is well-appearing, has a stable exam, and reassuring studies today, I do not feel that they warrant admission at this time. This plan was discussed with the patient who verbalizes agreement and comfort with this plan and seems reliable and able to return to the Emergency Department with worsening or changing symptoms.          Final Clinical Impression(s) / ED Diagnoses Final diagnoses:  Chest tightness    Rx / DC Orders ED Discharge Orders     None          Renne Crigler, PA-C 03/21/23 0001    Vanetta Mulders, MD 03/23/23 [phone]

## 2023-03-20 NOTE — Discharge Instructions (Signed)
Please read and follow all provided instructions.  Your diagnoses today include:  1. Chest tightness     Tests performed today include: An EKG of your heart A chest x-ray Cardiac enzymes - a blood test for heart muscle damage were both normal Blood counts and electrolytes Vital signs. See below for your results today.   Medications prescribed:  Start taking the Nexium that she got and use this for 2 weeks see if it helps with your symptoms.  Consult with your doctor to see if this would be something that they would want you on longer-term or if you can just use it temporarily.  Take any prescribed medications only as directed.  Follow-up instructions: Please follow-up with your primary care provider as soon as you can for further evaluation of your symptoms.   Return instructions:  SEEK IMMEDIATE MEDICAL ATTENTION IF: You have severe chest pain, especially if the pain is crushing or pressure-like and spreads to the arms, back, neck, or jaw, or if you have sweating, nausea or vomiting, or trouble with breathing. THIS IS AN EMERGENCY. Do not wait to see if the pain will go away. Get medical help at once. Call 911. DO NOT drive yourself to the hospital.  Your chest pain gets worse and does not go away after a few minutes of rest.  You have an attack of chest pain lasting longer than what you usually experience.  You have significant dizziness, if you pass out, or have trouble walking.  You have chest pain not typical of your usual pain for which you originally saw your caregiver.  You have any other emergent concerns regarding your health.  Additional Information: Chest pain comes from many different causes. Your caregiver has diagnosed you as having chest pain that is not specific for one problem, but does not require admission.  You are at low risk for an acute heart condition or other serious illness.   Your vital signs today were: BP (!) 144/72   Pulse 76   Temp 98.6 F (37 C)  (Oral)   Resp 20   Ht 5\' 7"  (1.702 m)   Wt 97.5 kg   SpO2 95%   BMI 33.67 kg/m  If your blood pressure (BP) was elevated above 135/85 this visit, please have this repeated by your doctor within one month. --------------

## 2023-03-20 NOTE — ED Triage Notes (Signed)
Pt to triage c/o CP SOB x 24 hrs. Pt states she has tightness in her chest and numbness in her right arm. Pt a/o x 4 skin warm dry intact. NIH 2. EKG completed. IV established labs drawn. VSS NAD PT on room air.

## 2023-03-20 NOTE — ED Notes (Signed)
 RN reviewed discharge instructions with pt. Pt verbalized understanding and had no further questions. VSS upon discharge.  

## 2023-04-08 ENCOUNTER — Telehealth: Payer: Self-pay

## 2023-04-08 NOTE — Telephone Encounter (Signed)
*  [facility]  Pharmacy Patient Advocate Encounter   Received notification from CoverMyMeds that prior authorization for Nurtec 75MG  dispersible tablets  is required/requested.   Insurance verification completed.   The patient is insured through [facility] .   Per test claim: PA required; PA submitted to [facility] via CoverMyMeds Key/confirmation #/EOC [id number] Status is pending

## 2023-04-21 ENCOUNTER — Telehealth: Payer: Self-pay

## 2023-04-21 NOTE — Telephone Encounter (Signed)
Transition Care Management Unsuccessful Follow-up Telephone Call  Date of discharge and from where:  03/20/2023 [facility]  Attempts:  1st Attempt  Reason for unsuccessful TCM follow-up call:  No answer/busy  Vonette Grosso [facility]  [facility], [facility] Guide Direct Dial: [phone]  Website: [email or web address]

## 2023-04-22 ENCOUNTER — Telehealth: Payer: Self-pay

## 2023-04-22 NOTE — Telephone Encounter (Signed)
Transition Care Management Follow-up Telephone Call Date of discharge and from where: 03/20/2023 [facility] How have you been since you were released from the hospital? Patient is still experiencing chest tightness. Any questions or concerns? No  Items Reviewed: Did the pt receive and understand the discharge instructions provided? Yes  Medications obtained and verified?  Patient is able to obtain OTC meds. Other?  Emailed information for [employer]. Any new allergies since your discharge? No  Dietary orders reviewed? Yes Do you have support at home? Yes   Follow up appointments reviewed:  PCP Hospital f/u appt confirmed? Yes  Scheduled to see Neta Mends. Panosh, MD on 06/15/2023 @ [facility] [employer] at [place]. Specialist Hospital f/u appt confirmed? No  Scheduled to see  on  @ . Are transportation arrangements needed? No  If their condition worsens, is the pt aware to call PCP or go to the Emergency Dept.? Yes Was the patient provided with contact information for the PCP's office or ED? Yes Was to pt encouraged to call back with questions or concerns? Yes   Laura Smith [facility]  [facility], [facility] Guide Direct Dial: [phone]  Website: [email or web address]

## 2023-04-23 NOTE — Telephone Encounter (Signed)
Pharmacy Patient Advocate Encounter  Received notification from [facility] that Prior Authorization for Nurtec 75MG  dispersible tablets  has been APPROVED from 04/20/2023 to 04/07/2024

## 2023-06-03 ENCOUNTER — Other Ambulatory Visit: Payer: Self-pay | Admitting: Internal Medicine

## 2023-06-04 NOTE — Telephone Encounter (Signed)
Attempted to reach pt. Left a detail message that prescription request is sent. To call us back if have questions.

## 2023-06-04 NOTE — Telephone Encounter (Signed)
Pt had a death in family and will be leaving going out of town tomorrow and would like refill today. Pt is aware rx can take up to 3 business days

## 2023-06-07 ENCOUNTER — Telehealth: Payer: Self-pay

## 2023-06-07 NOTE — Telephone Encounter (Signed)
Pt called and stated she was called by [facility] to schedule her MRI Breast Screening.  Pt stated she thought that when she was in clinic with Dr. Mosetta Putt, she no longer needed mammogram and etc.  Pt stated she has [employer] and her copay is $400 for this procedure.  Pt wants to know if it's really necessary that she has the MRI Breast Screening.  Notified Dr. Mosetta Putt and Team of pt's call.

## 2023-06-08 ENCOUNTER — Telehealth: Payer: Self-pay

## 2023-06-08 ENCOUNTER — Other Ambulatory Visit: Payer: Self-pay | Admitting: Internal Medicine

## 2023-06-08 ENCOUNTER — Other Ambulatory Visit: Payer: Self-pay

## 2023-06-08 DIAGNOSIS — C3490 Malignant neoplasm of unspecified part of unspecified bronchus or lung: Secondary | ICD-10-CM

## 2023-06-08 NOTE — Telephone Encounter (Addendum)
Patient called and LVM in regards to CT scan and provider appts.  Spoke with patient and she states that she would like her CT scan at GI due to insurance purposes. Called GI and scheduled appt. Patient called back and states that deductible is met and [facility] is fine for CT scan. Canceled CT for GI.  Called centralized scheduling and scheduled CT scan at [facility] on 12/26.  Waiting for patient to call back to confirm appts.   Patient called back and wanted to change the CT from 12/26 to 12/23 @ 5 pm. Made lab appt at 4 pm.  Patient verbalized understanding.

## 2023-06-09 ENCOUNTER — Telehealth: Payer: Self-pay

## 2023-06-09 NOTE — Telephone Encounter (Signed)
LVM stating the MRI of pt's bilateral breast was placed d/t pt's high risk of recurrence.  Stated in the pt's last office visit with Dr. Mosetta Putt, the pt agreed to having the MRI done; therefore, Dr. Mosetta Putt ordered the MRI of pt's breast.  Stated Dr. Mosetta Putt is OK if the pt does not want to have the MRI done d/t the large out of pocket cost.  Instructed pt to contact Dr. Latanya Maudlin office indicating if the pt would like to have the MRI done or not.

## 2023-06-13 NOTE — Progress Notes (Signed)
Chief Complaint  Patient presents with   Annual Exam    HPI: Patient  Laura Smith  61 y.o. comes in today for Preventive Health Care visit  Last visit with me virtual 5 23  PV 12 22  Thyroid on replacement Adeno cancer of lung  mohamed   Migraines ncer Health Maintenance  Topic Date Due   COVID-19 Vaccine (4 - 2024-25 season) 07/01/2023 (Originally 02/21/2023)   MAMMOGRAM  01/22/2024   Cervical Cancer Screening (HPV/Pap Cotest)  07/04/2024   Colonoscopy  08/14/2025   DTaP/Tdap/Td (3 - Td or Tdap) 04/29/2026   INFLUENZA VACCINE  Completed   Hepatitis C Screening  Completed   HIV Screening  Completed   Zoster Vaccines- Shingrix  Completed   HPV VACCINES  Aged Out   Health Maintenance Review LIFESTYLE:  Exercise:   not reg .  Caution with back  Tobacco/ETS: n Alcohol:  n Sugar beverages:  not daily  Sleep: try for 8  nocturia  Drug use: no HH of 2  cat Work: after 27 years  laid off  stress ful but coping ok     ROS:  GEN/ HEENT: No fever, significant weight changes sweats headaches vision problems hearing changes, CV/ PULM; No chest pain shortness of breath cough, syncope,edema  change in exercise tolerance. GI /GU: No adominal pain, vomiting, change in bowel habits. No blood in the stool. No significant GU symptoms. SKIN/HEME: ,no acute skin rashes suspicious lesions or bleeding. No lymphadenopathy, nodules, masses.  NEURO/ PSYCH:  No neurologic signs such as weakness numbness. No depression anxiety. IMM/ Allergy: No unusual infections.  Allergy .   REST of 12 system review negative except as per HPI   Past Medical History:  Diagnosis Date   Allergy    Breast cyst    Endometriosis    surgery laser 2001   Family history of breast cancer    Family history of colon cancer    Family history of prostate cancer    Hemorrhoids    Hx of basal cell carcinoma    Hypertension    Migraine    Thyroid disease    Urinary tract infection     Past Surgical History:   Procedure Laterality Date   APPENDECTOMY  1992   AT C/S   BACK SURGERY  06/06/2020   L3-4   BRONCHIAL BIOPSY  10/14/2022   Procedure: BRONCHIAL BIOPSIES;  Surgeon: Josephine Igo, DO;  [facility];  Service: Pulmonary;;   CESAREAN SECTION  1992   APPENDECTOMY SAME TIME   COLONOSCOPY  2015   FIDUCIAL MARKER PLACEMENT  10/14/2022   Procedure: FIDUCIAL DYE MARKING;  Surgeon: Josephine Igo, DO;  [facility];  Service: Pulmonary;;   HYSTEROSCOPY  08/1999   AT TIME OF LAPAROSCOPY   INTERCOSTAL NERVE BLOCK Left 10/14/2022   Procedure: INTERCOSTAL NERVE BLOCK;  Surgeon: Corliss Skains, MD;  Location: [facility];  Service: Thoracic;  Laterality: Left;   NODE DISSECTION Left 10/14/2022   Procedure: NODE DISSECTION;  Surgeon: Corliss Skains, MD;  Location: [facility];  Service: Thoracic;  Laterality: Left;   PELVIC LAPAROSCOPY  08/1999   AND HYSTEROSCOPY--ENDOMETRIOSIS   POLYPECTOMY      Family History  Problem Relation Age of Onset   Breast cancer Mother        Age 68   Colon cancer Mother 64   Breast cancer Cousin 32       paternal first cousin   Heart disease  Father 37       Heart attack   Hypertension Paternal Grandmother    Heart disease Paternal Grandmother    Stroke Paternal Grandmother    Cancer Paternal Grandmother        Lung   Cancer Paternal Grandfather        Prostate   Stroke Maternal Grandfather    COPD Maternal Uncle    Liver cancer Paternal Uncle    Congestive Heart Failure Maternal Grandmother    Hypertension Paternal Aunt    Colon polyps Neg Hx    Stomach cancer Neg Hx    Rectal cancer Neg Hx    Esophageal cancer Neg Hx     Social History   Socioeconomic History   Marital status: Married    Spouse name: Rosanne Ashing   Number of children: 1   Years of education: Not on file   Highest education level: Associate degree: occupational, Scientist, product/process development, or vocational program  Occupational History   Not on file  Tobacco Use   Smoking status:  Never   Smokeless tobacco: Never  Vaping Use   Vaping status: Never Used  Substance and Sexual Activity   Alcohol use: No    Alcohol/week: 0.0 standard drinks of alcohol   Drug use: No   Sexual activity: Not Currently    Partners: Male    Birth control/protection: Post-menopausal, Abstinence  Other Topics Concern   Not on file  Social History Narrative   h h of 2 Pet cat   Daughter /  Married    g1 p1   Works at [employer]   Social Drivers of [employer] Strain: Not on file  Food Insecurity: No Food Insecurity (10/16/2022)   Hunger Vital Sign    Worried About Running Out of Food in the Last Year: Never true    Ran Out of Food in the Last Year: Never true  Transportation Needs: No Transportation Needs (10/16/2022)   PRAPARE - Administrator, Civil Service (Medical): No    Lack of Transportation (Non-Medical): No  Physical Activity: Inactive (06/15/2023)   Exercise Vital Sign    Days of Exercise per Week: 0 days    Minutes of Exercise per Session: 0 min  Stress: Stress Concern Present (06/15/2023)   [employer] of Occupational Health - Occupational Stress Questionnaire    Feeling of Stress : Rather much  Social Connections: Socially Integrated (06/15/2023)   Social Connection and Isolation Panel [NHANES]    Frequency of Communication with Friends and Family: More than three times a week    Frequency of Social Gatherings with Friends and Family: More than three times a week    Attends Religious Services: More than 4 times per year    Active Member of [employer] or Organizations: Yes    Attends Engineer, structural: More than 4 times per year    Marital Status: Married    Outpatient Medications Prior to Visit  Medication Sig Dispense Refill   Ascorbic Acid (VITAMIN C) 1000 MG tablet Take 1,000 mg by mouth 2 (two) times daily.     b complex vitamins tablet Take 1 tablet by mouth daily.     CALCIUM-MAG-VIT C-VIT D PO Take 1 tablet by  mouth every other day. Alternate days with calcium vitamin d     cetirizine (ZYRTEC) 10 MG tablet Take 10 mg by mouth daily.     clobetasol ointment (TEMOVATE) 0.05 % Apply 1 Application topically 2 (two) times daily  as needed.     cyclobenzaprine (FLEXERIL) 10 MG tablet Take 10 mg by mouth 3 (three) times daily as needed for muscle spasms.     EPINEPHrine 0.3 mg/0.3 mL IJ SOAJ injection Inject 0.3 mg into the muscle as needed for anaphylaxis. 1 each 1   hydrochlorothiazide (HYDRODIURIL) 25 MG tablet TAKE 1 TABLET BY MOUTH DAILY 90 tablet 3   ibuprofen (ADVIL) 200 MG tablet Take 600 mg by mouth every 6 (six) hours as needed for moderate pain.     levothyroxine (SYNTHROID) 50 MCG tablet TAKE 1 TABLET DAILY 90 tablet 3   meclizine (ANTIVERT) 25 MG tablet TAKE 2 BY MOUTH TWICE DAILY AS NEEDED FOR TRAVEL 30 tablet 0   Misc Natural Products (RED WINE EXTRACT PO) Take 1 capsule by mouth daily. Red wine extract     Multiple Vitamins-Iron (MULTIVITAMIN/IRON) TABS Take 1 tablet by mouth daily.     NURTEC 75 MG TBDP TAKE 1 TABLET BY MOUTH AS NEEDED FOR MIGRAINES 8 tablet 12   nystatin powder Apply topically 2 (two) times daily. As needed for rash 30 g 5   sulindac (CLINORIL) 200 MG tablet Take 1 tablet (200 mg total) by mouth 2 (two) times daily as needed (headache). Reported on 09/23/2015 30 tablet 5   traMADol (ULTRAM) 50 MG tablet Take 50 mg by mouth every 6 (six) hours as needed for moderate pain.     verapamil (CALAN-SR) 180 MG CR tablet TAKE 1 TABLET BY MOUTH AT BEDTIME 90 tablet 3   vitamin E 180 MG (400 UNITS) capsule Take 400 Units by mouth daily.     Calcium Citrate-Vitamin D (CALCIUM + D PO) Take 1 tablet by mouth every other day. Alternate days with calcium magnesium     oxyCODONE (OXY IR/ROXICODONE) 5 MG immediate release tablet Take 1 tablet (5 mg total) by mouth every 6 (six) hours as needed for moderate pain. (Patient not taking: Reported on 06/15/2023) 30 tablet 0   pregabalin (LYRICA) 25 MG  capsule Take 1 capsule (25 mg total) by mouth 2 (two) times daily. 60 capsule 0   Facility-Administered Medications Prior to Visit  Medication Dose Route Frequency Provider Last Rate Last Admin   methylPREDNISolone acetate (DEPO-MEDROL) injection 120 mg  120 mg Intramuscular Once Ledarrius Beauchaine, Neta Mends, MD         EXAM:  BP 110/80 (BP Location: Left Arm, Patient Position: Sitting, Cuff Size: Normal)   Pulse 81   Temp 98.1 F (36.7 C) (Oral)   Ht 5' 6.5" (1.689 m)   Wt 221 lb 3.2 oz (100.3 kg)   SpO2 96%   BMI 35.17 kg/m   Body mass index is 35.17 kg/m. Wt Readings from Last 3 Encounters:  06/15/23 221 lb 3.2 oz (100.3 kg)  03/20/23 215 lb (97.5 kg)  12/15/22 220 lb 6.4 oz (100 kg)    Physical Exam: Vital signs reviewed GUY:QIHK is a well-developed well-nourished alert cooperative    who appearsr stated age in no acute distress.  HEENT: normocephalic atraumatic , Eyes: PERRL EOM's full, conjunctiva clear, Nares: paten,t no deformity discharge or tenderness., Ears: no deformity EAC's clear TMs with normal landmarks. Mouth: clear OP, no lesions, edema.  Moist mucous membranes. Dentition in adequate repair. NECK: supple without masses, thyromegaly or bruits. CHEST/PULM:  Clear to auscultation and percussion breath sounds equal no wheeze , rales or rhonchi. No chest wall deformities or tenderness. Breast: normal by inspection . No dimpling, discharge, masses, tenderness or discharge . CV: PMI  is nondisplaced, S1 S2 no gallops, murmurs, rubs. Peripheral pulses are full without delay.No JVD .  ABDOMEN: Bowel sounds normal nontender  No guard or rebound, no hepato splenomegal no CVA tenderness.  No hernia. Extremtities:  No clubbing cyanosis or edema, no acute joint swelling or redness no focal atrophy NEURO:  Oriented x3, cranial nerves 3-12 appear to be intact, no obvious focal weakness,gait within normal limits no abnormal reflexes or asymmetrical SKIN: No acute rashes normal turgor,  color, no bruising or petechiae. PSYCH: Oriented, good eye contact, no obvious depression anxiety, cognition and judgment appear normal. LN: no cervical axillary inguinal adenopathy  Lab Results  Component Value Date   WBC 11.1 (H) 06/14/2023   HGB 14.3 06/14/2023   HCT 42.2 06/14/2023   PLT 355 06/14/2023   GLUCOSE 78 06/14/2023   CHOL 195 06/15/2023   TRIG 98.0 06/15/2023   HDL 60.60 06/15/2023   LDLDIRECT 133.8 04/07/2013   LDLCALC 115 (H) 06/15/2023   ALT 17 06/14/2023   AST 19 06/14/2023   NA 138 06/14/2023   K 3.5 06/14/2023   CL 103 06/14/2023   CREATININE 0.95 06/14/2023   BUN 13 06/14/2023   CO2 29 06/14/2023   TSH 2.42 06/15/2023   INR 1.0 10/12/2022   HGBA1C 6.0 06/15/2023    BP Readings from Last 3 Encounters:  06/15/23 110/80  03/20/23 (!) 144/72  12/15/22 (!) 132/90    Lab results Roosvelt Harps reviewed with patient   ASSESSMENT AND PLAN:  Discussed the following assessment and plan:    ICD-10-CM   1. Visit for preventive health examination  Z00.00 Lipid panel    TSH    Hemoglobin A1c    Hep C Antibody    2. Influenza vaccine needed  Z23 Flu vaccine trivalent PF, 6mos and older(Flulaval,Afluria,Fluarix,Fluzone)    3. Hypothyroidism, unspecified type  E03.9 Lipid panel    TSH    Hemoglobin A1c    4. Hypertension, unspecified type  I10 Lipid panel    TSH    Hemoglobin A1c    5. Medication management  Z79.899 Lipid panel    TSH    Hemoglobin A1c    6. Need for hepatitis C screening test  Z11.59 Hep C Antibody    7. Adenocarcinoma of upper lobe of left lung (HCC)  C34.12     Recent job loss and family losses  agree with counseling help. Pro active  prefers choristain based counseling  Ht controlled  Thyroid med monitoring Return in about 1 year (around 06/14/2024).  Patient Care Team: Goldy Calandra, Neta Mends, MD as PCP - General (Internal Medicine) Donzetta Starch, MD as Consulting Physician (Dermatology) Billie Ruddy, OD as Referring Physician  (Optometry) Olivia Mackie, NP as Nurse Practitioner (Gynecology) Josephine Igo, DO as Consulting Physician (Pulmonary Disease) Patient Instructions  Good to see you today . Checking lab not done  by oncology  thyroid cholesterol  etc.    [phone].[facility] [phone] [facility]   [facility]  Phone [phone] Fax [phone]    Neta Mends. Daielle Melcher M.D.

## 2023-06-14 ENCOUNTER — Inpatient Hospital Stay: Payer: BC Managed Care – PPO | Attending: Internal Medicine

## 2023-06-14 ENCOUNTER — Encounter (HOSPITAL_COMMUNITY): Payer: Self-pay

## 2023-06-14 ENCOUNTER — Ambulatory Visit (HOSPITAL_COMMUNITY)
Admission: RE | Admit: 2023-06-14 | Discharge: 2023-06-14 | Disposition: A | Discharge status: Home / Self Care | Payer: BC Managed Care – PPO | Source: Ambulatory Visit | Attending: Internal Medicine | Admitting: Internal Medicine

## 2023-06-14 DIAGNOSIS — I771 Stricture of artery: Secondary | ICD-10-CM | POA: Diagnosis not present

## 2023-06-14 DIAGNOSIS — Z87440 Personal history of urinary (tract) infections: Secondary | ICD-10-CM | POA: Insufficient documentation

## 2023-06-14 DIAGNOSIS — Z803 Family history of malignant neoplasm of breast: Secondary | ICD-10-CM | POA: Diagnosis not present

## 2023-06-14 DIAGNOSIS — C3412 Malignant neoplasm of upper lobe, left bronchus or lung: Secondary | ICD-10-CM | POA: Diagnosis not present

## 2023-06-14 DIAGNOSIS — Z91030 Bee allergy status: Secondary | ICD-10-CM | POA: Insufficient documentation

## 2023-06-14 DIAGNOSIS — Z8719 Personal history of other diseases of the digestive system: Secondary | ICD-10-CM | POA: Diagnosis not present

## 2023-06-14 DIAGNOSIS — G43909 Migraine, unspecified, not intractable, without status migrainosus: Secondary | ICD-10-CM | POA: Diagnosis not present

## 2023-06-14 DIAGNOSIS — I700 Atherosclerosis of aorta: Secondary | ICD-10-CM | POA: Diagnosis not present

## 2023-06-14 DIAGNOSIS — Z8042 Family history of malignant neoplasm of prostate: Secondary | ICD-10-CM | POA: Diagnosis not present

## 2023-06-14 DIAGNOSIS — R519 Headache, unspecified: Secondary | ICD-10-CM | POA: Insufficient documentation

## 2023-06-14 DIAGNOSIS — C3490 Malignant neoplasm of unspecified part of unspecified bronchus or lung: Secondary | ICD-10-CM | POA: Insufficient documentation

## 2023-06-14 DIAGNOSIS — Z9049 Acquired absence of other specified parts of digestive tract: Secondary | ICD-10-CM | POA: Insufficient documentation

## 2023-06-14 DIAGNOSIS — Z79890 Hormone replacement therapy (postmenopausal): Secondary | ICD-10-CM | POA: Insufficient documentation

## 2023-06-14 DIAGNOSIS — R7402 Elevation of levels of lactic acid dehydrogenase (LDH): Secondary | ICD-10-CM | POA: Insufficient documentation

## 2023-06-14 DIAGNOSIS — Z800 Family history of malignant neoplasm of digestive organs: Secondary | ICD-10-CM | POA: Insufficient documentation

## 2023-06-14 DIAGNOSIS — I10 Essential (primary) hypertension: Secondary | ICD-10-CM | POA: Diagnosis not present

## 2023-06-14 DIAGNOSIS — Z79899 Other long term (current) drug therapy: Secondary | ICD-10-CM | POA: Diagnosis not present

## 2023-06-14 LAB — CBC WITH DIFFERENTIAL (CANCER CENTER ONLY)
Abs Immature Granulocytes: 0.03 10*3/uL (ref 0.00–0.07)
Basophils Absolute: 0.1 10*3/uL (ref 0.0–0.1)
Basophils Relative: 1 %
Eosinophils Absolute: 0.2 10*3/uL (ref 0.0–0.5)
Eosinophils Relative: 2 %
HCT: 42.2 % (ref 36.0–46.0)
Hemoglobin: 14.3 g/dL (ref 12.0–15.0)
Immature Granulocytes: 0 %
Lymphocytes Relative: 32 %
Lymphs Abs: 3.6 10*3/uL (ref 0.7–4.0)
MCH: 29.2 pg (ref 26.0–34.0)
MCHC: 33.9 g/dL (ref 30.0–36.0)
MCV: 86.3 fL (ref 80.0–100.0)
Monocytes Absolute: 0.9 10*3/uL (ref 0.1–1.0)
Monocytes Relative: 8 %
Neutro Abs: 6.3 10*3/uL (ref 1.7–7.7)
Neutrophils Relative %: 57 %
Platelet Count: 355 10*3/uL (ref 150–400)
RBC: 4.89 MIL/uL (ref 3.87–5.11)
RDW: 13.6 % (ref 11.5–15.5)
WBC Count: 11.1 10*3/uL — ABNORMAL HIGH (ref 4.0–10.5)
nRBC: 0 % (ref 0.0–0.2)

## 2023-06-14 LAB — CMP (CANCER CENTER ONLY)
ALT: 17 U/L (ref 0–44)
AST: 19 U/L (ref 15–41)
Albumin: 4.1 g/dL (ref 3.5–5.0)
Alkaline Phosphatase: 63 U/L (ref 38–126)
Anion gap: 6 (ref 5–15)
BUN: 13 mg/dL (ref 8–23)
CO2: 29 mmol/L (ref 22–32)
Calcium: 9.3 mg/dL (ref 8.9–10.3)
Chloride: 103 mmol/L (ref 98–111)
Creatinine: 0.95 mg/dL (ref 0.44–1.00)
GFR, Estimated: >60 mL/min (ref >60)
Glucose, Bld: 78 mg/dL (ref 70–99)
Potassium: 3.5 mmol/L (ref 3.5–5.1)
Sodium: 138 mmol/L (ref 135–145)
Total Bilirubin: 0.3 mg/dL (ref <1.2)
Total Protein: 6.7 g/dL (ref 6.5–8.1)

## 2023-06-14 MED ORDER — IOHEXOL 300 MG/ML  SOLN
75.0000 mL | Freq: Once | INTRAMUSCULAR | Status: AC | PRN
  Administered 2023-06-14: 75 mL via INTRAVENOUS

## 2023-06-15 ENCOUNTER — Ambulatory Visit (INDEPENDENT_AMBULATORY_CARE_PROVIDER_SITE_OTHER): Payer: BC Managed Care – PPO | Admitting: Internal Medicine

## 2023-06-15 ENCOUNTER — Encounter: Payer: Self-pay | Admitting: Internal Medicine

## 2023-06-15 VITALS — BP 110/80 | HR 81 | Temp 98.1°F | Ht 66.5 in | Wt 221.2 lb

## 2023-06-15 DIAGNOSIS — E039 Hypothyroidism, unspecified: Secondary | ICD-10-CM

## 2023-06-15 DIAGNOSIS — Z23 Encounter for immunization: Secondary | ICD-10-CM

## 2023-06-15 DIAGNOSIS — I10 Essential (primary) hypertension: Secondary | ICD-10-CM

## 2023-06-15 DIAGNOSIS — Z79899 Other long term (current) drug therapy: Secondary | ICD-10-CM | POA: Diagnosis not present

## 2023-06-15 DIAGNOSIS — Z0000 Encounter for general adult medical examination without abnormal findings: Secondary | ICD-10-CM | POA: Diagnosis not present

## 2023-06-15 DIAGNOSIS — C3412 Malignant neoplasm of upper lobe, left bronchus or lung: Secondary | ICD-10-CM | POA: Diagnosis not present

## 2023-06-15 DIAGNOSIS — Z1159 Encounter for screening for other viral diseases: Secondary | ICD-10-CM

## 2023-06-15 LAB — LIPID PANEL
Cholesterol: 195 mg/dL (ref 0–200)
HDL: 60.6 mg/dL (ref >39.00)
LDL Cholesterol: 115 mg/dL — ABNORMAL HIGH (ref 0–99)
NonHDL: 134.46
Total CHOL/HDL Ratio: 3
Triglycerides: 98 mg/dL (ref 0.0–149.0)
VLDL: 19.6 mg/dL (ref 0.0–40.0)

## 2023-06-15 LAB — HEMOGLOBIN A1C: Hgb A1c MFr Bld: 6 % (ref 4.6–6.5)

## 2023-06-15 LAB — TSH: TSH: 2.42 u[IU]/mL (ref 0.35–5.50)

## 2023-06-15 NOTE — Patient Instructions (Addendum)
Good to see you today . Checking lab not done  by oncology  thyroid cholesterol  etc.    [phone].[facility] [phone] [facility]  [facility]   [facility]  Phone [phone] Fax [phone]

## 2023-06-16 LAB — HEPATITIS C ANTIBODY: Hepatitis C Ab: NONREACTIVE

## 2023-06-17 ENCOUNTER — Ambulatory Visit (HOSPITAL_COMMUNITY): Payer: BC Managed Care – PPO

## 2023-06-17 ENCOUNTER — Inpatient Hospital Stay: Payer: BC Managed Care – PPO

## 2023-06-21 ENCOUNTER — Encounter: Payer: Self-pay | Admitting: Internal Medicine

## 2023-06-21 NOTE — Progress Notes (Signed)
Stable results  hg A1c same  in prediabetic range Thyroid tsh in range . Continue attention to  lifestyle intervention healthy eating and exercise .

## 2023-06-22 ENCOUNTER — Other Ambulatory Visit: Payer: Self-pay

## 2023-06-22 ENCOUNTER — Inpatient Hospital Stay: Payer: BC Managed Care – PPO | Admitting: Internal Medicine

## 2023-06-22 VITALS — BP 132/69 | HR 73 | Temp 97.9°F | Resp 17 | Ht 66.5 in | Wt 219.0 lb

## 2023-06-22 DIAGNOSIS — Z800 Family history of malignant neoplasm of digestive organs: Secondary | ICD-10-CM | POA: Diagnosis not present

## 2023-06-22 DIAGNOSIS — R7402 Elevation of levels of lactic acid dehydrogenase (LDH): Secondary | ICD-10-CM | POA: Diagnosis not present

## 2023-06-22 DIAGNOSIS — Z9049 Acquired absence of other specified parts of digestive tract: Secondary | ICD-10-CM | POA: Diagnosis not present

## 2023-06-22 DIAGNOSIS — E039 Hypothyroidism, unspecified: Secondary | ICD-10-CM

## 2023-06-22 DIAGNOSIS — Z803 Family history of malignant neoplasm of breast: Secondary | ICD-10-CM | POA: Diagnosis not present

## 2023-06-22 DIAGNOSIS — Z8042 Family history of malignant neoplasm of prostate: Secondary | ICD-10-CM | POA: Diagnosis not present

## 2023-06-22 DIAGNOSIS — Z79890 Hormone replacement therapy (postmenopausal): Secondary | ICD-10-CM | POA: Diagnosis not present

## 2023-06-22 DIAGNOSIS — C3412 Malignant neoplasm of upper lobe, left bronchus or lung: Secondary | ICD-10-CM | POA: Diagnosis not present

## 2023-06-22 DIAGNOSIS — R519 Headache, unspecified: Secondary | ICD-10-CM | POA: Diagnosis not present

## 2023-06-22 DIAGNOSIS — I700 Atherosclerosis of aorta: Secondary | ICD-10-CM | POA: Diagnosis not present

## 2023-06-22 DIAGNOSIS — C3490 Malignant neoplasm of unspecified part of unspecified bronchus or lung: Secondary | ICD-10-CM

## 2023-06-22 DIAGNOSIS — Z91030 Bee allergy status: Secondary | ICD-10-CM | POA: Diagnosis not present

## 2023-06-22 DIAGNOSIS — I10 Essential (primary) hypertension: Secondary | ICD-10-CM | POA: Diagnosis not present

## 2023-06-22 DIAGNOSIS — Z79899 Other long term (current) drug therapy: Secondary | ICD-10-CM | POA: Diagnosis not present

## 2023-06-22 DIAGNOSIS — Z8719 Personal history of other diseases of the digestive system: Secondary | ICD-10-CM | POA: Diagnosis not present

## 2023-06-22 DIAGNOSIS — Z87440 Personal history of urinary (tract) infections: Secondary | ICD-10-CM | POA: Diagnosis not present

## 2023-06-22 DIAGNOSIS — G43909 Migraine, unspecified, not intractable, without status migrainosus: Secondary | ICD-10-CM | POA: Diagnosis not present

## 2023-06-22 MED ORDER — HYDROCHLOROTHIAZIDE 25 MG PO TABS: 25.0000 mg | ORAL_TABLET | Freq: Every day | ORAL | 3 refills | Status: DC

## 2023-06-22 MED ORDER — LEVOTHYROXINE SODIUM 50 MCG PO TABS: 50.0000 ug | ORAL_TABLET | Freq: Every day | ORAL | 3 refills | Status: DC

## 2023-06-22 MED ORDER — VERAPAMIL HCL ER 180 MG PO TBCR: 180.0000 mg | EXTENDED_RELEASE_TABLET | Freq: Every day | ORAL | 3 refills | Status: DC

## 2023-06-22 NOTE — Progress Notes (Signed)
 [facility] Telephone:([phone]) [phone]   Fax:([phone]) [phone]  OFFICE PROGRESS NOTE  Panosh, [name], MD [address] [place] [address]  DIAGNOSIS: stage Ia (T1b, N0, M0) non-small cell lung cancer, adenocarcinoma diagnosed in April 2024.  PRIOR THERAPY: status post left upper lobectomy with lymph node sampling under the care of Dr. Shyrl on October 11, 2022 with tumor size of 1.3 cm.   CURRENT THERAPY: Observation.  INTERVAL HISTORY: Laura Smith 62 y.o. female returns to the clinic today for follow-up visit accompanied by her husband.Discussed the use of AI scribe software for clinical note transcription with the patient, who gave verbal consent to proceed.  History of Present Illness   Laura Smith, a 62 year old patient with a history of stage 1A non-small cell lung cancer, underwent a left upper lobectomy in April 2024. Since the surgery, she has been under observation with no new health issues reported in the last six months. She denies experiencing chest pain, shortness of breath, hemoptysis, nausea, vomiting, or diarrhea.  She reported a recent episode of a migraine, which she attributed to weather changes. This was not an unusual occurrence for her, as she has a history of migraines.  She had a scan done last week, which revealed tiny nodules in the lungs, some on the left and some on the right. These nodules are very small, measuring around 2-3 millimeters. She expressed concern about these nodules, questioning if they could be cancerous.  She also mentioned that her recent lab work indicated a slightly elevated white blood count and high LDL cholesterol levels. She was not on any medication for these conditions at the time of the consultation.  She expressed curiosity about the appearance of her lung post-lobectomy, indicating a desire to understand her condition better.       MEDICAL HISTORY: Past Medical History:  Diagnosis Date    Allergy    Breast cyst    Endometriosis    surgery laser 2001   Family history of breast cancer    Family history of colon cancer    Family history of prostate cancer    Hemorrhoids    Hx of basal cell carcinoma    Hypertension    Migraine    Thyroid  disease    Urinary tract infection     ALLERGIES:  is allergic to bee venom, etodolac, latex, other, and adhesive [tape].  MEDICATIONS:  Current Outpatient Medications  Medication Sig Dispense Refill   Ascorbic Acid (VITAMIN C) 1000 MG tablet Take 1,000 mg by mouth 2 (two) times daily.     b complex vitamins tablet Take 1 tablet by mouth daily.     CALCIUM-MAG-VIT C-VIT D PO Take 1 tablet by mouth every other day. Alternate days with calcium vitamin d     cetirizine (ZYRTEC) 10 MG tablet Take 10 mg by mouth daily.     clobetasol ointment (TEMOVATE) 0.05 % Apply 1 Application topically 2 (two) times daily as needed.     cyclobenzaprine  (FLEXERIL ) 10 MG tablet Take 10 mg by mouth 3 (three) times daily as needed for muscle spasms.     EPINEPHrine  0.3 mg/0.3 mL IJ SOAJ injection Inject 0.3 mg into the muscle as needed for anaphylaxis. 1 each 1   hydrochlorothiazide  (HYDRODIURIL ) 25 MG tablet TAKE 1 TABLET BY MOUTH DAILY 90 tablet 3   ibuprofen (ADVIL) 200 MG tablet Take 600 mg by mouth every 6 (six) hours as needed for moderate pain.  levothyroxine  (SYNTHROID ) 50 MCG tablet TAKE 1 TABLET DAILY 90 tablet 3   meclizine  (ANTIVERT ) 25 MG tablet TAKE 2 BY MOUTH TWICE DAILY AS NEEDED FOR TRAVEL 30 tablet 0   Misc Natural Products (RED WINE EXTRACT PO) Take 1 capsule by mouth daily. Red wine extract     Multiple Vitamins-Iron (MULTIVITAMIN/IRON) TABS Take 1 tablet by mouth daily.     NURTEC 75 MG TBDP TAKE 1 TABLET BY MOUTH AS NEEDED FOR MIGRAINES 8 tablet 12   nystatin  powder Apply topically 2 (two) times daily. As needed for rash 30 g 5   sulindac  (CLINORIL ) 200 MG tablet Take 1 tablet (200 mg total) by mouth 2 (two) times daily as needed  (headache). Reported on 09/23/2015 30 tablet 5   traMADol  (ULTRAM ) 50 MG tablet Take 50 mg by mouth every 6 (six) hours as needed for moderate pain.     verapamil  (CALAN -SR) 180 MG CR tablet TAKE 1 TABLET BY MOUTH AT BEDTIME 90 tablet 3   vitamin E 180 MG (400 UNITS) capsule Take 400 Units by mouth daily.     Current Facility-Administered Medications  Medication Dose Route Frequency Provider Last Rate Last Admin   methylPREDNISolone  acetate (DEPO-MEDROL ) injection 120 mg  120 mg Intramuscular Once Panosh, [name], MD        SURGICAL HISTORY:  Past Surgical History:  Procedure Laterality Date   APPENDECTOMY  1992   AT C/S   BACK SURGERY  06/06/2020   L3-4   BRONCHIAL BIOPSY  10/14/2022   Procedure: BRONCHIAL BIOPSIES;  Surgeon: Brenna Adine CROME, DO;  [facility];  Service: Pulmonary;;   CESAREAN SECTION  1992   APPENDECTOMY SAME TIME   COLONOSCOPY  2015   FIDUCIAL MARKER PLACEMENT  10/14/2022   Procedure: FIDUCIAL DYE MARKING;  Surgeon: Brenna Adine CROME, DO;  [facility];  Service: Pulmonary;;   HYSTEROSCOPY  08/1999   AT TIME OF LAPAROSCOPY   INTERCOSTAL NERVE BLOCK Left 10/14/2022   Procedure: INTERCOSTAL NERVE BLOCK;  Surgeon: Shyrl Linnie KIDD, MD;  [facility];  Service: Thoracic;  Laterality: Left;   NODE DISSECTION Left 10/14/2022   Procedure: NODE DISSECTION;  Surgeon: Shyrl Linnie KIDD, MD;  [facility]: [facility];  Service: Thoracic;  Laterality: Left;   PELVIC LAPAROSCOPY  08/1999   AND HYSTEROSCOPY--ENDOMETRIOSIS   POLYPECTOMY      REVIEW OF SYSTEMS:  Constitutional: negative Eyes: negative Ears, nose, mouth, throat, and face: negative Respiratory: negative Cardiovascular: negative Gastrointestinal: negative Genitourinary:negative Integument/breast: negative Hematologic/lymphatic: negative Musculoskeletal:negative Neurological: positive for headaches Behavioral/Psych: negative Endocrine: negative Allergic/Immunologic: negative   PHYSICAL  EXAMINATION: General appearance: alert, cooperative, and no distress Head: Normocephalic, without obvious abnormality, atraumatic Neck: no adenopathy, no JVD, supple, symmetrical, trachea midline, and thyroid  not enlarged, symmetric, no tenderness/mass/nodules Lymph nodes: Cervical, supraclavicular, and axillary nodes normal. Resp: clear to auscultation bilaterally Back: symmetric, no curvature. ROM normal. No CVA tenderness. Cardio: regular rate and rhythm, S1, S2 normal, no murmur, click, rub or gallop GI: soft, non-tender; bowel sounds normal; no masses,  no organomegaly Extremities: extremities normal, atraumatic, no cyanosis or edema Neurologic: Alert and oriented X 3, normal strength and tone. Normal symmetric reflexes. Normal coordination and gait  ECOG PERFORMANCE STATUS: 1 - Symptomatic but completely ambulatory  Blood pressure 132/69, pulse 73, temperature 97.9 F (36.6 C), resp. rate 17, height 5' 6.5 (1.689 m), weight 219 lb (99.3 kg), SpO2 98%.  LABORATORY DATA: Lab Results  Component Value Date   WBC 11.1 (H) 06/14/2023  HGB 14.3 06/14/2023   HCT 42.2 06/14/2023   MCV 86.3 06/14/2023   PLT 355 06/14/2023      Chemistry      Component Value Date/Time   NA 138 06/14/2023 1533   K 3.5 06/14/2023 1533   CL 103 06/14/2023 1533   CO2 29 06/14/2023 1533   BUN 13 06/14/2023 1533   CREATININE 0.95 06/14/2023 1533   CREATININE 0.94 05/24/2020 0914      Component Value Date/Time   CALCIUM 9.3 06/14/2023 1533   ALKPHOS 63 06/14/2023 1533   AST 19 06/14/2023 1533   ALT 17 06/14/2023 1533   BILITOT 0.3 06/14/2023 1533       RADIOGRAPHIC STUDIES: CT Chest W Contrast Result Date: 06/22/2023 CLINICAL DATA:  Restaging non-small cell lung cancer. * Tracking Code: BO * EXAM: CT CHEST WITH CONTRAST TECHNIQUE: Multidetector CT imaging of the chest was performed during intravenous contrast administration. RADIATION DOSE REDUCTION: This exam was performed according to the  departmental dose-optimization program which includes automated exposure control, adjustment of the mA and/or kV according to patient size and/or use of iterative reconstruction technique. CONTRAST:  75mL OMNIPAQUE  IOHEXOL  300 MG/ML  SOLN COMPARISON:  05/22/2022 CT.  PET of 08/05/2022. FINDINGS: Cardiovascular: Aortic atherosclerosis. Normal heart size, without pericardial effusion. Tortuous thoracic aorta. No central pulmonary embolism, on this non-dedicated study. Mediastinum/Nodes: No supraclavicular adenopathy. No mediastinal or hilar adenopathy. Lungs/Pleura: No pleural fluid.  Interval left upper lobectomy. 4 mm right apical ground-glass nodule on 25/6 is unchanged. Posterior right upper lobe 3 mm solid nodule on 39/6 is similar. A left lower lobe solid 2 mm nodule on 66/6 is similar. Upper Abdomen: Normal imaged portions of the liver, spleen, stomach, pancreas, gallbladder, adrenal glands, right kidney. Too small to characterize left renal lesions do not warrant specific imaging follow-up. Musculoskeletal: No acute osseous abnormality. IMPRESSION: 1. Interval left upper lobectomy, without recurrent or metastatic disease. 2. Tiny bilateral pulmonary nodules are unchanged and likely benign. 3.  Aortic Atherosclerosis (ICD10-I70.0). Electronically Signed   By: Rockey Kilts M.D.   On: 06/22/2023 08:46    ASSESSMENT AND PLAN: This is a very pleasant 62 years old white female diagnosed with stage Ia (T1b, N0, M0) non-small cell lung cancer, adenocarcinoma status post left upper lobectomy with lymph node sampling under the care of Dr. Shyrl on October 11, 2022 with tumor size of 1.3 cm.  The patient is currently on observation and she is feeling fine with no concerning complaints. She had repeat CT scan of the chest performed recently.  I personally and independently reviewed the scan and discussed the result with the patient and her husband and showed them the images. Assessment and Plan    Stage 1A  Non-Small Cell Lung Cancer (NSCLC) Diagnosed in April 2024. Underwent left upper lobectomy on October 11, 2022. Recent scan shows no concerning findings, with small nodules (2-3 mm) in both lungs, likely benign but will be monitored for changes. No new symptoms reported. Radiologist believes nodules are benign, but continued monitoring is advised. - Continue observation - Monitor lung nodules with follow-up scans  Elevated White Blood Cell (WBC) Count Slightly elevated WBC count at 11.1, trending down towards normal. No signs of infection or other concerning symptoms. Possible causes include inflammation or allergies. - Continue monitoring WBC count  Elevated LDL Cholesterol Elevated LDL cholesterol noted on lipid panel. Not currently on medication. Discussed potential causes and management strategies including diet and weight control. Referred to family doctor for further management. -  Refer to family doctor for cholesterol management - Recommend dietary modifications and weight management  Migraine Recent migraine attributed to weather changes. Infrequent occurrences. - Monitor for frequency and severity of migraines - Consider referral to neurology if migraines become more frequent or severe  Follow-up - Schedule follow-up appointment in six months.   She was advised to call immediately if she has any other concerning symptoms in the interval. The patient voices understanding of current disease status and treatment options and is in agreement with the current care plan.  All questions were answered. The patient knows to call the clinic with any problems, questions or concerns. We can certainly see the patient much sooner if necessary.  The total time spent in the appointment was 30 minutes.  Disclaimer: This note was dictated with voice recognition software. Similar sounding words can inadvertently be transcribed and may not be corrected upon review.

## 2023-06-25 ENCOUNTER — Other Ambulatory Visit: Payer: BC Managed Care – PPO

## 2023-07-15 ENCOUNTER — Ambulatory Visit: Payer: BC Managed Care – PPO

## 2023-07-16 ENCOUNTER — Ambulatory Visit: Payer: BC Managed Care – PPO

## 2023-07-27 ENCOUNTER — Encounter: Payer: Self-pay | Admitting: Diagnostic Neuroimaging

## 2023-07-27 ENCOUNTER — Ambulatory Visit: Payer: BC Managed Care – PPO | Admitting: Diagnostic Neuroimaging

## 2023-07-27 VITALS — BP 132/89 | HR 93 | Ht 67.0 in | Wt 222.2 lb

## 2023-07-27 DIAGNOSIS — G43109 Migraine with aura, not intractable, without status migrainosus: Secondary | ICD-10-CM

## 2023-07-27 NOTE — Progress Notes (Signed)
 [facility]  PATIENT: Laura Smith DOB: 06-Jul-1960  REFERRING CLINICIAN: Panosh, [name], MD HISTORY FROM: Patient REASON FOR VISIT: follow up   HISTORICAL  CHIEF COMPLAINT:  Chief Complaint  Patient presents with   Follow-up    Pt in room 6 alone. Here for migraine follow up. Pt said migraines are doing great. Only had 1 migraine in 8 months.  Takes Nurtec works well.    HISTORY OF PRESENT ILLNESS:   UPDATE (07/27/23, VRP): Since last visit, doing well with migraines. On nurtec as needed. Only had 5-6 migraines in the last year. Also had new dx of stage Ia (T1b, N0, M0) non-small cell lung cancer, adenocarcinoma diagnosed in April 2024 per Dr. Jeannett care.   UPDATE (06/08/22, VRP): Since last visit, doing well on meds. Symptoms are stable. No alleviating or aggravating factors. Avg 1 HA per month. Planning to retire next year.   UPDATE (06/06/21, VRP): Since last visit, doing about the same. Memory issues mild and stable. Still working.   Here for migraine transfer of care --> started since 63 years old, right side headache, nausea, sens to lighy, hours, now about 1 per month. Triggers --> rain and menstrual cycle in past. Good results with nurtec, sulindac , ibuprofen, tylenol .   PRIOR HPI: 63 year old female here for evaluation memory loss.  Patient has been working at current company for more than 20 years.  She was using a particular type of software and job description for many years.  In May 2021 a transition to a new software and patient began training to learn the new process.  She tried from May until September 2021 but had quite difficult time learning the new routine.  She was surprised that she was unable to learn this.  She mentioned that she had some difficulty learning because she was being taught by 3 different people who contradicted she is other on instructions.  Previously she was considered a superuser of the old software system.  She did  not get selected to be super user for the new system.  She is also having some problems with word finding difficulties, short-term memory loss, difficulty staying focused.  No major changes in ADLs.  Patient has been out of work since November 2021 due to back pain issues as well.    REVIEW OF SYSTEMS: Full 14 system review of systems performed and negative with exception of: as per HPI.  ALLERGIES: Allergies  Allergen Reactions   Bee Venom Anaphylaxis    Very Large local reaction, leg and sob  seen in [place] ago    Etodolac Hives    Possible anaphylaxis  With vomiting and throat feeling tight  ED visit 8 14    Latex Hives, Rash and Other (See Comments)    Causes blisters   Other Hives, Rash and Other (See Comments)    Gel used for ultrasound-causes blisters also    Adhesive [Tape] Itching and Rash    HOME MEDICATIONS: Outpatient Medications Prior to Visit  Medication Sig Dispense Refill   Ascorbic Acid (VITAMIN C) 1000 MG tablet Take 1,000 mg by mouth 2 (two) times daily.     CALCIUM-MAG-VIT C-VIT D PO Take 1 tablet by mouth every other day. Alternate days with calcium vitamin d     cetirizine (ZYRTEC) 10 MG tablet Take 10 mg by mouth daily.     clobetasol ointment (TEMOVATE) 0.05 % Apply 1 Application topically 2 (two) times daily as needed.  cyclobenzaprine  (FLEXERIL ) 10 MG tablet Take 10 mg by mouth 3 (three) times daily as needed for muscle spasms.     EPINEPHrine  0.3 mg/0.3 mL IJ SOAJ injection Inject 0.3 mg into the muscle as needed for anaphylaxis. 1 each 1   hydrochlorothiazide  (HYDRODIURIL ) 25 MG tablet Take 1 tablet (25 mg total) by mouth daily. 90 tablet 3   ibuprofen (ADVIL) 200 MG tablet Take 600 mg by mouth every 6 (six) hours as needed for moderate pain.     levothyroxine  (SYNTHROID ) 50 MCG tablet Take 1 tablet (50 mcg total) by mouth daily. 90 tablet 3   meclizine  (ANTIVERT ) 25 MG tablet TAKE 2 BY MOUTH TWICE DAILY AS NEEDED FOR TRAVEL 30 tablet  0   Misc Natural Products (RED WINE EXTRACT PO) Take 1 capsule by mouth daily. Red wine extract     Multiple Vitamins-Iron (MULTIVITAMIN/IRON) TABS Take 1 tablet by mouth daily.     NURTEC 75 MG TBDP TAKE 1 TABLET BY MOUTH AS NEEDED FOR MIGRAINES 8 tablet 12   nystatin  powder Apply topically 2 (two) times daily. As needed for rash 30 g 5   sulindac  (CLINORIL ) 200 MG tablet Take 1 tablet (200 mg total) by mouth 2 (two) times daily as needed (headache). Reported on 09/23/2015 30 tablet 5   traMADol  (ULTRAM ) 50 MG tablet Take 50 mg by mouth every 6 (six) hours as needed for moderate pain.     verapamil  (CALAN -SR) 180 MG CR tablet Take 1 tablet (180 mg total) by mouth at bedtime. 90 tablet 3   vitamin E 180 MG (400 UNITS) capsule Take 400 Units by mouth daily.     b complex vitamins tablet Take 1 tablet by mouth daily. (Patient not taking: Reported on 07/27/2023)     Facility-Administered Medications Prior to Visit  Medication Dose Route Frequency Provider Last Rate Last Admin   methylPREDNISolone  acetate (DEPO-MEDROL ) injection 120 mg  120 mg Intramuscular Once Panosh, Wanda K, MD        PAST MEDICAL HISTORY: Past Medical History:  Diagnosis Date   Allergy    Breast cyst    Endometriosis    surgery laser 2001   Family history of breast cancer    Family history of colon cancer    Family history of prostate cancer    Hemorrhoids    Hx of basal cell carcinoma    Hypertension    Lung cancer (HCC)    09/2022   Migraine    Thyroid  disease    Urinary tract infection     PAST SURGICAL HISTORY: Past Surgical History:  Procedure Laterality Date   APPENDECTOMY  1992   AT C/S   BACK SURGERY  06/06/2020   L3-4   BRONCHIAL BIOPSY  10/14/2022   Procedure: BRONCHIAL BIOPSIES;  Surgeon: Brenna Adine CROME, DO;  Location: [facility];  Service: Pulmonary;;   CESAREAN SECTION  1992   APPENDECTOMY SAME TIME   COLONOSCOPY  2015   FIDUCIAL MARKER PLACEMENT  10/14/2022   Procedure: FIDUCIAL DYE  MARKING;  Surgeon: Brenna Adine CROME, DO;  Location: [facility];  Service: Pulmonary;;   HYSTEROSCOPY  08/1999   AT TIME OF LAPAROSCOPY   INTERCOSTAL NERVE BLOCK Left 10/14/2022   Procedure: INTERCOSTAL NERVE BLOCK;  Surgeon: Shyrl Linnie KIDD, MD;  Location: [facility] OR;  Service: Thoracic;  Laterality: Left;   LUNG SURGERY Left    09/2022/ left lobe removal   NODE DISSECTION Left 10/14/2022   Procedure: NODE DISSECTION;  Surgeon: Shyrl Linnie  O, MD;  Location: [facility] OR;  Service: Thoracic;  Laterality: Left;   PELVIC LAPAROSCOPY  08/1999   AND HYSTEROSCOPY--ENDOMETRIOSIS   POLYPECTOMY      FAMILY HISTORY: Family History  Problem Relation Age of Onset   Breast cancer Mother        Age 37   Colon cancer Mother 57   Breast cancer Cousin 44       paternal first cousin   Heart disease Father 46       Heart attack   Hypertension Paternal Grandmother    Heart disease Paternal Grandmother    Stroke Paternal Grandmother    Cancer Paternal Grandmother        Lung   Cancer Paternal Grandfather        Prostate   Stroke Maternal Grandfather    COPD Maternal Uncle    Liver cancer Paternal Uncle    Congestive Heart Failure Maternal Grandmother    Hypertension Paternal Aunt    Colon polyps Neg Hx    Stomach cancer Neg Hx    Rectal cancer Neg Hx    Esophageal cancer Neg Hx     SOCIAL HISTORY: Social History   Socioeconomic History   Marital status: Married    Spouse name: Signe   Number of children: 1   Years of education: Not on file   Highest education level: Associate degree: occupational, scientist, product/process development, or vocational program  Occupational History   Not on file  Tobacco Use   Smoking status: Never   Smokeless tobacco: Never  Vaping Use   Vaping status: Never Used  Substance and Sexual Activity   Alcohol use: No    Alcohol/week: 0.0 standard drinks of alcohol   Drug use: No   Sexual activity: Not Currently    Partners: Male    Birth control/protection: Post-menopausal,  Abstinence  Other Topics Concern   Not on file  Social History Narrative   h h of 2 Pet cat   Daughter /  Married    g1 p1   Works at [employer]   Social Drivers of Corporate Investment Banker Strain: Not on file  Food Insecurity: No Food Insecurity (10/16/2022)   Hunger Vital Sign    Worried About Running Out of Food in the Last Year: Never true    Ran Out of Food in the Last Year: Never true  Transportation Needs: No Transportation Needs (10/16/2022)   PRAPARE - Administrator, Civil Service (Medical): No    Lack of Transportation (Non-Medical): No  Physical Activity: Inactive (06/15/2023)   Exercise Vital Sign    Days of Exercise per Week: 0 days    Minutes of Exercise per Session: 0 min  Stress: Stress Concern Present (06/15/2023)   [employer] of Occupational Health - Occupational Stress Questionnaire    Feeling of Stress : Rather much  Social Connections: Socially Integrated (06/15/2023)   Social Connection and Isolation Panel [NHANES]    Frequency of Communication with Friends and Family: More than three times a week    Frequency of Social Gatherings with Friends and Family: More than three times a week    Attends Religious Services: More than 4 times per year    Active Member of [employer] or Organizations: Yes    Attends Engineer, Structural: More than 4 times per year    Marital Status: Married  Catering Manager Violence: Not on file     PHYSICAL EXAM  GENERAL EXAM/CONSTITUTIONAL: Vitals:  Vitals:  07/27/23 1611  BP: 132/89  Pulse: 93  Weight: 222 lb 3.2 oz (100.8 kg)  Height: 5' 7 (1.702 m)    Body mass index is 34.8 kg/m. Wt Readings from Last 3 Encounters:  07/27/23 222 lb 3.2 oz (100.8 kg)  06/22/23 219 lb (99.3 kg)  06/15/23 221 lb 3.2 oz (100.3 kg)   Patient is in no distress; well developed, nourished and groomed; neck is supple  CARDIOVASCULAR: Examination of carotid arteries is normal; no carotid bruits Regular  rate and rhythm, no murmurs Examination of peripheral vascular system by observation and palpation is normal  EYES: Ophthalmoscopic exam of optic discs and posterior segments is normal; no papilledema or hemorrhages No results found.  MUSCULOSKELETAL: Gait, strength, tone, movements noted in Neurologic exam below  NEUROLOGIC: MENTAL STATUS:     09/24/2020    3:09 PM  MMSE - Mini Mental State Exam  Orientation to time 5  Orientation to Place 5  Registration 3  Attention/ Calculation 4  Recall 3  Language- name 2 objects 2  Language- repeat 1  Language- follow 3 step command 3  Language- read & follow direction 1  Write a sentence 1  Copy design 1  Total score 29   awake, alert, oriented to person, place and time recent and remote memory intact normal attention and concentration language fluent, comprehension intact, naming intact fund of knowledge appropriate  CRANIAL NERVE:  2nd - no papilledema on fundoscopic exam 2nd, 3rd, 4th, 6th - pupils equal and reactive to light, visual fields full to confrontation, extraocular muscles intact, no nystagmus 5th - facial sensation symmetric 7th - facial strength symmetric 8th - hearing intact 9th - palate elevates symmetrically, uvula midline 11th - shoulder shrug symmetric 12th - tongue protrusion midline  MOTOR:  normal bulk and tone, full strength in the BUE, BLE  SENSORY:  normal and symmetric to light touch, temperature, vibration  COORDINATION:  finger-nose-finger, fine finger movements normal  REFLEXES:  deep tendon reflexes present and symmetric  GAIT/STATION:  narrow based gait     DIAGNOSTIC DATA (LABS, IMAGING, TESTING) - I reviewed patient records, labs, notes, testing and imaging myself where available.  Lab Results  Component Value Date   WBC 11.1 (H) 06/14/2023   HGB 14.3 06/14/2023   HCT 42.2 06/14/2023   MCV 86.3 06/14/2023   PLT 355 06/14/2023      Component Value Date/Time   NA 138  06/14/2023 1533   K 3.5 06/14/2023 1533   CL 103 06/14/2023 1533   CO2 29 06/14/2023 1533   GLUCOSE 78 06/14/2023 1533   BUN 13 06/14/2023 1533   CREATININE 0.95 06/14/2023 1533   CREATININE 0.94 05/24/2020 0914   CALCIUM 9.3 06/14/2023 1533   PROT 6.7 06/14/2023 1533   ALBUMIN 4.1 06/14/2023 1533   AST 19 06/14/2023 1533   ALT 17 06/14/2023 1533   ALKPHOS 63 06/14/2023 1533   BILITOT 0.3 06/14/2023 1533   GFRNONAA >60 06/14/2023 1533   GFRNONAA 66 05/24/2020 0914   GFRAA 76 05/24/2020 0914   Lab Results  Component Value Date   CHOL 195 06/15/2023   HDL 60.60 06/15/2023   LDLCALC 115 (H) 06/15/2023   LDLDIRECT 133.8 04/07/2013   TRIG 98.0 06/15/2023   CHOLHDL 3 06/15/2023   Lab Results  Component Value Date   HGBA1C 6.0 06/15/2023   Lab Results  Component Value Date   VITAMINB12 562 09/24/2020   Lab Results  Component Value Date   TSH 2.42 06/15/2023  10/02/20 Normal MRI brain    ASSESSMENT AND PLAN  63 y.o. year old female here with mild subjective memory loss issues since 2021 with no changes in ADLs.  Could represent mild cognitive impairment or other secondary cause.     Dx:  1. Migraine with aura and without status migrainosus, not intractable      PLAN:  MIGRAINE PREVENTION  LIFESTYLE CHANGES -Stop or avoid smoking -Decrease or avoid caffeine / alcohol -Eat and sleep on a regular schedule -Exercise several times per week  MIGRAINE RESCUE (4-5 migraines in last 1 year) - ibuprofen, tylenol  as needed - continue rimegepant (Nurtec) 75mg  as needed for breakthrough headache; max 8 per month  MILD MEMORY LOSS (stable) - daily physical activity / exercise (at least 15-30 minutes) - eat more plants / vegetables - increase social activities, brain stimulation, games, puzzles, hobbies, crafts, arts, music - aim for at least 7-8 hours sleep per night (or more) - avoid smoking and alcohol - caregiver resources provided - caution with medications,  finances, driving  personality / behavior (OCD tendency, perfectionism, ADD tendencies) - monitor for now  Return in about 1 year (around 07/26/2024) for MyChart visit (15 min).    EDUARD FABIENE HANLON, MD 07/27/2023, 5:01 PM Certified in Neurology, Neurophysiology and Neuroimaging  [facility] [address], [address] [place], [place] [address] [phone]

## 2023-07-27 NOTE — Patient Instructions (Signed)
 MIGRAINE PREVENTION  LIFESTYLE CHANGES -Stop or avoid smoking -Decrease or avoid caffeine / alcohol -Eat and sleep on a regular schedule -Exercise several times per week  MIGRAINE RESCUE (4-5 migraines in last 1 year) - ibuprofen, tylenol  as needed - continue rimegepant (Nurtec) 75mg  as needed for breakthrough headache; max 8 per month

## 2023-08-18 DIAGNOSIS — L308 Other specified dermatitis: Secondary | ICD-10-CM | POA: Diagnosis not present

## 2023-08-18 DIAGNOSIS — L812 Freckles: Secondary | ICD-10-CM | POA: Diagnosis not present

## 2023-08-18 DIAGNOSIS — L821 Other seborrheic keratosis: Secondary | ICD-10-CM | POA: Diagnosis not present

## 2023-08-18 DIAGNOSIS — L820 Inflamed seborrheic keratosis: Secondary | ICD-10-CM | POA: Diagnosis not present

## 2023-08-18 DIAGNOSIS — D1801 Hemangioma of skin and subcutaneous tissue: Secondary | ICD-10-CM | POA: Diagnosis not present

## 2023-09-20 ENCOUNTER — Encounter: Payer: Self-pay | Admitting: Family Medicine

## 2023-09-20 ENCOUNTER — Ambulatory Visit: Admitting: Family Medicine

## 2023-09-20 VITALS — BP 122/80 | HR 85 | Temp 98.3°F | Resp 16 | Ht 67.0 in | Wt 222.1 lb

## 2023-09-20 DIAGNOSIS — K118 Other diseases of salivary glands: Secondary | ICD-10-CM | POA: Diagnosis not present

## 2023-09-20 LAB — CBC WITH DIFFERENTIAL/PLATELET
Basophils Absolute: 0.1 10*3/uL (ref 0.0–0.1)
Basophils Relative: 0.5 % (ref 0.0–3.0)
Eosinophils Absolute: 0.1 10*3/uL (ref 0.0–0.7)
Eosinophils Relative: 1.1 % (ref 0.0–5.0)
HCT: 44.8 % (ref 36.0–46.0)
Hemoglobin: 15 g/dL (ref 12.0–15.0)
Lymphocytes Relative: 27.7 % (ref 12.0–46.0)
Lymphs Abs: 2.7 10*3/uL (ref 0.7–4.0)
MCHC: 33.6 g/dL (ref 30.0–36.0)
MCV: 87 fl (ref 78.0–100.0)
Monocytes Absolute: 0.7 10*3/uL (ref 0.1–1.0)
Monocytes Relative: 7.5 % (ref 3.0–12.0)
Neutro Abs: 6.1 10*3/uL (ref 1.4–7.7)
Neutrophils Relative %: 63.2 % (ref 43.0–77.0)
Platelets: 375 10*3/uL (ref 150.0–400.0)
RBC: 5.15 Mil/uL — ABNORMAL HIGH (ref 3.87–5.11)
RDW: 13.6 % (ref 11.5–15.5)
WBC: 9.6 10*3/uL (ref 4.0–10.5)

## 2023-09-20 LAB — C-REACTIVE PROTEIN: CRP: <1 mg/dL (ref 0.5–20.0)

## 2023-09-20 NOTE — Progress Notes (Signed)
 ACUTE VISIT Chief Complaint  Patient presents with   neck swollen    Noticed in Saturday morning, has been using heat & cold but still having pain   HPI: [name] is a 63 y.o. female with a PMHx significant for migraine, HTN, pulmonary nodule, non-small cell lung cancer,  migraine headaches, and back pain here today complaining of neck swelling and pain as described above.  Left submandibular swelling and tenderness since 3/29. Her neck has also been hot to the touch and painful with touch and movement. She rates the pain as a 10/10 when touching the swollen area. It is not worsened by eating or chewing, or when eating sour foods.  She has been applying ice and a heating pad to her neck and taking advil.  Pertinent negatives include recent injury, recent URI, oral lesions, fever, chills, abnormal weight loss, night sweats, or ear ache.   Non small cell lung cancer s/p left upper lobectomy. Not on pharmacologic treatment. She sees her oncologist every 6 months, and last saw them on 06/22/2023.  Last chest CT 06/22/23 1. Interval left upper lobectomy, without recurrent or metastatic disease. 2. Tiny bilateral pulmonary nodules are unchanged and likely benign. 3.  Aortic Atherosclerosis (ICD10-I70.0)  Review of Systems  Constitutional:  Negative for activity change and appetite change.  HENT:  Negative for dental problem, ear discharge, facial swelling, mouth sores, sore throat and trouble swallowing.   Respiratory:  Negative for cough, shortness of breath and wheezing.   Cardiovascular:  Negative for chest pain.  Gastrointestinal:  Negative for abdominal pain and nausea.  Genitourinary:  Negative for decreased urine volume, dysuria and hematuria.  Skin:  Negative for rash.  Neurological:  Negative for syncope, weakness and headaches.  See other pertinent positives and negatives in HPI.  Current Outpatient Medications on File Prior to Visit  Medication Sig Dispense Refill    Ascorbic Acid (VITAMIN C) 1000 MG tablet Take 1,000 mg by mouth 2 (two) times daily.     b complex vitamins tablet Take 1 tablet by mouth daily.     CALCIUM-MAG-VIT C-VIT D PO Take 1 tablet by mouth every other day. Alternate days with calcium vitamin d     cetirizine (ZYRTEC) 10 MG tablet Take 10 mg by mouth daily.     clobetasol ointment (TEMOVATE) 0.05 % Apply 1 Application topically 2 (two) times daily as needed.     cyclobenzaprine (FLEXERIL) 10 MG tablet Take 10 mg by mouth 3 (three) times daily as needed for muscle spasms.     EPINEPHrine 0.3 mg/0.3 mL IJ SOAJ injection Inject 0.3 mg into the muscle as needed for anaphylaxis. 1 each 1   hydrochlorothiazide (HYDRODIURIL) 25 MG tablet Take 1 tablet (25 mg total) by mouth daily. 90 tablet 3   ibuprofen (ADVIL) 200 MG tablet Take 600 mg by mouth every 6 (six) hours as needed for moderate pain.     levothyroxine (SYNTHROID) 50 MCG tablet Take 1 tablet (50 mcg total) by mouth daily. 90 tablet 3   meclizine (ANTIVERT) 25 MG tablet TAKE 2 BY MOUTH TWICE DAILY AS NEEDED FOR TRAVEL 30 tablet 0   Misc Natural Products (RED WINE EXTRACT PO) Take 1 capsule by mouth daily. Red wine extract     Multiple Vitamins-Iron (MULTIVITAMIN/IRON) TABS Take 1 tablet by mouth daily.     NURTEC 75 MG TBDP TAKE 1 TABLET BY MOUTH AS NEEDED FOR MIGRAINES 8 tablet 12   nystatin powder Apply topically 2 (two)  times daily. As needed for rash 30 g 5   sulindac (CLINORIL) 200 MG tablet Take 1 tablet (200 mg total) by mouth 2 (two) times daily as needed (headache). Reported on 09/23/2015 30 tablet 5   traMADol (ULTRAM) 50 MG tablet Take 50 mg by mouth every 6 (six) hours as needed for moderate pain.     verapamil (CALAN-SR) 180 MG CR tablet Take 1 tablet (180 mg total) by mouth at bedtime. 90 tablet 3   vitamin E 180 MG (400 UNITS) capsule Take 400 Units by mouth daily.     Current Facility-Administered Medications on File Prior to Visit  Medication Dose Route Frequency  Provider Last Rate Last Admin   methylPREDNISolone acetate (DEPO-MEDROL) injection 120 mg  120 mg Intramuscular Once Panosh, Neta Mends, MD        Past Medical History:  Diagnosis Date   Allergy    Breast cyst    Endometriosis    surgery laser 2001   Family history of breast cancer    Family history of colon cancer    Family history of prostate cancer    Hemorrhoids    Hx of basal cell carcinoma    Hypertension    Lung cancer (HCC)    09/2022   Migraine    Thyroid disease    Urinary tract infection    Allergies  Allergen Reactions   Bee Venom Anaphylaxis    Very Large local reaction, leg and sob  seen in [place] years ago    Etodolac Hives    Possible anaphylaxis  With vomiting and throat feeling tight  ED visit 8 14    Latex Hives, Rash and Other (See Comments)    Causes blisters   Other Hives, Rash and Other (See Comments)    Gel used for ultrasound-causes blisters also    Adhesive [Tape] Itching and Rash   Social History   Socioeconomic History   Marital status: Married    Spouse name: Rosanne Ashing   Number of children: 1   Years of education: Not on file   Highest education level: Associate degree: occupational, Scientist, product/process development, or vocational program  Occupational History   Not on file  Tobacco Use   Smoking status: Never   Smokeless tobacco: Never  Vaping Use   Vaping status: Never Used  Substance and Sexual Activity   Alcohol use: No    Alcohol/week: 0.0 standard drinks of alcohol   Drug use: No   Sexual activity: Not Currently    Partners: Male    Birth control/protection: Post-menopausal, Abstinence  Other Topics Concern   Not on file  Social History Narrative   h h of 2 Pet cat   Daughter /  Married    g1 p1   Works at [employer]   Social Drivers of [employer] Strain: Not on file  Food Insecurity: No Food Insecurity (10/16/2022)   Hunger Vital Sign    Worried About Running Out of Food in the Last Year: Never true    Ran Out of  Food in the Last Year: Never true  Transportation Needs: No Transportation Needs (10/16/2022)   PRAPARE - Administrator, Civil Service (Medical): No    Lack of Transportation (Non-Medical): No  Physical Activity: Inactive (06/15/2023)   Exercise Vital Sign    Days of Exercise per Week: 0 days    Minutes of Exercise per Session: 0 min  Stress: Stress Concern Present (06/15/2023)   [place]  Institute of Occupational Health - Occupational Stress Questionnaire    Feeling of Stress : Rather much  Social Connections: Socially Integrated (06/15/2023)   Social Connection and Isolation Panel [NHANES]    Frequency of Communication with Friends and Family: More than three times a week    Frequency of Social Gatherings with Friends and Family: More than three times a week    Attends Religious Services: More than 4 times per year    Active Member of [employer] or Organizations: Yes    Attends Engineer, structural: More than 4 times per year    Marital Status: Married   Vitals:   09/20/23 1346  BP: 122/80  Pulse: 85  Resp: 16  Temp: 98.3 F (36.8 C)  SpO2: 97%   Body mass index is 34.79 kg/m.  Physical Exam Vitals and nursing note reviewed.  Constitutional:      General: She is not in acute distress.    Appearance: She is well-developed.  HENT:     Head: Normocephalic and atraumatic.     Jaw: Tenderness present.     Right Ear: Tympanic membrane, ear canal and external ear normal.     Left Ear: Tympanic membrane, ear canal and external ear normal.     Mouth/Throat:     Mouth: Mucous membranes are moist.     Pharynx: Oropharynx is clear.  Eyes:     Conjunctiva/sclera: Conjunctivae normal.  Neck:   Cardiovascular:     Rate and Rhythm: Normal rate and regular rhythm.     Pulses:          Dorsalis pedis pulses are 2+ on the right side and 2+ on the left side.     Heart sounds: No murmur heard. Pulmonary:     Effort: Pulmonary effort is normal. No respiratory  distress.     Breath sounds: Normal breath sounds.  Musculoskeletal:     Cervical back: No erythema. Pain with movement present.  Lymphadenopathy:     Cervical: No cervical adenopathy.  Skin:    General: Skin is warm.     Findings: No erythema or rash.  Neurological:     General: No focal deficit present.     Mental Status: She is alert and oriented to person, place, and time.     Cranial Nerves: No cranial nerve deficit.     Gait: Gait normal.  Psychiatric:        Mood and Affect: Mood and affect normal.   ASSESSMENT AND PLAN:  Ms. Barkalow was seen today for neck swelling and pain.   Submandibular gland tenderness -     CBC with Differential/Platelet; Future -     C-reactive protein; Future  We discussed possible etiologies, ? Musculoskeletal,viral lymphadenitis . I do not appreciate significant abnormalities on area of concerned except for pain. Hx does not suggest a bacterial infectious process.  Because her hx of lung cancer, blood work recommended today. I do not think imaging is needed at this time. Recommend short course of NSAID's, Ibuprofen 400 gm tid with food x 7 d. Monitor for new symptoms. Instructed about warning signs.  Lab Results  Component Value Date   WBC 9.6 09/20/2023   HGB 15.0 09/20/2023   HCT 44.8 09/20/2023   MCV 87.0 09/20/2023   PLT 375.0 09/20/2023   Lab Results  Component Value Date   CRP <1.0 09/20/2023   I spent a total of 30 minutes in both face to face and non face to face activities  for this visit on the date of this encounter. During this time history was obtained and documented, examination was performed, prior labs/imaging reviewed, and assessment/plan discussed.  Return in about 3 weeks (around 10/11/2023), or if symptoms worsen or fail to improve.  I, Rolla Etienne Wierda, acting as a scribe for Liara Holm [place], MD., have documented all relevant documentation on the behalf of Shawnta Zimbelman [place], MD, as directed by  Klarisa Barman [place], MD while in  the presence of Kharter Sestak [place], MD.   I, Kaianna Dolezal [place], MD, have reviewed all documentation for this visit. The documentation on 09/20/23 for the exam, diagnosis, procedures, and orders are all accurate and complete.  Jakai Risse G. [place], MD  [facility]. [place] office.

## 2023-09-20 NOTE — Patient Instructions (Addendum)
 A few things to remember from today's visit:  Submandibular gland tenderness - Plan: CBC with Differential/Platelet, C-reactive protein ? Muscular, mandibular joint related. Monitor for changes. Ibuprofen 400 mg with food 3 times daily for 7 days.  If you need refills for medications you take chronically, please call your pharmacy. Do not use My Chart to request refills or for acute issues that need immediate attention. If you send a my chart message, it may take a few days to be addressed, specially if I am not in the office.  Please be sure medication list is accurate. If a new problem present, please set up appointment sooner than planned today.

## 2023-11-24 ENCOUNTER — Telehealth: Payer: Self-pay | Admitting: Internal Medicine

## 2023-12-13 ENCOUNTER — Other Ambulatory Visit: Payer: BC Managed Care – PPO

## 2023-12-20 ENCOUNTER — Ambulatory Visit: Payer: BC Managed Care – PPO | Admitting: Internal Medicine

## 2023-12-30 ENCOUNTER — Inpatient Hospital Stay

## 2023-12-30 ENCOUNTER — Ambulatory Visit (HOSPITAL_COMMUNITY)
Admission: RE | Admit: 2023-12-30 | Discharge: 2023-12-30 | Disposition: A | Discharge status: Home / Self Care | Source: Ambulatory Visit | Attending: Internal Medicine | Admitting: Internal Medicine

## 2023-12-30 DIAGNOSIS — Z91030 Bee allergy status: Secondary | ICD-10-CM | POA: Insufficient documentation

## 2023-12-30 DIAGNOSIS — Z85828 Personal history of other malignant neoplasm of skin: Secondary | ICD-10-CM | POA: Insufficient documentation

## 2023-12-30 DIAGNOSIS — I10 Essential (primary) hypertension: Secondary | ICD-10-CM | POA: Insufficient documentation

## 2023-12-30 DIAGNOSIS — M549 Dorsalgia, unspecified: Secondary | ICD-10-CM | POA: Insufficient documentation

## 2023-12-30 DIAGNOSIS — Z9049 Acquired absence of other specified parts of digestive tract: Secondary | ICD-10-CM | POA: Insufficient documentation

## 2023-12-30 DIAGNOSIS — Z800 Family history of malignant neoplasm of digestive organs: Secondary | ICD-10-CM | POA: Insufficient documentation

## 2023-12-30 DIAGNOSIS — Z8042 Family history of malignant neoplasm of prostate: Secondary | ICD-10-CM | POA: Insufficient documentation

## 2023-12-30 DIAGNOSIS — C3490 Malignant neoplasm of unspecified part of unspecified bronchus or lung: Secondary | ICD-10-CM

## 2023-12-30 DIAGNOSIS — Z8719 Personal history of other diseases of the digestive system: Secondary | ICD-10-CM | POA: Insufficient documentation

## 2023-12-30 DIAGNOSIS — Z803 Family history of malignant neoplasm of breast: Secondary | ICD-10-CM | POA: Insufficient documentation

## 2023-12-30 DIAGNOSIS — Z87440 Personal history of urinary (tract) infections: Secondary | ICD-10-CM | POA: Insufficient documentation

## 2023-12-30 DIAGNOSIS — M1990 Unspecified osteoarthritis, unspecified site: Secondary | ICD-10-CM | POA: Insufficient documentation

## 2023-12-30 DIAGNOSIS — C3412 Malignant neoplasm of upper lobe, left bronchus or lung: Secondary | ICD-10-CM | POA: Insufficient documentation

## 2023-12-30 DIAGNOSIS — Z79899 Other long term (current) drug therapy: Secondary | ICD-10-CM | POA: Insufficient documentation

## 2023-12-30 LAB — CBC WITH DIFFERENTIAL (CANCER CENTER ONLY)
Abs Immature Granulocytes: 0.03 K/uL (ref 0.00–0.07)
Basophils Absolute: 0.1 K/uL (ref 0.0–0.1)
Basophils Relative: 1 %
Eosinophils Absolute: 0.1 K/uL (ref 0.0–0.5)
Eosinophils Relative: 1 %
HCT: 45.4 % (ref 36.0–46.0)
Hemoglobin: 15.6 g/dL — ABNORMAL HIGH (ref 12.0–15.0)
Immature Granulocytes: 0 %
Lymphocytes Relative: 27 %
Lymphs Abs: 2.5 K/uL (ref 0.7–4.0)
MCH: 29.2 pg (ref 26.0–34.0)
MCHC: 34.4 g/dL (ref 30.0–36.0)
MCV: 85 fL (ref 80.0–100.0)
Monocytes Absolute: 0.6 K/uL (ref 0.1–1.0)
Monocytes Relative: 6 %
Neutro Abs: 6.2 K/uL (ref 1.7–7.7)
Neutrophils Relative %: 65 %
Platelet Count: 371 K/uL (ref 150–400)
RBC: 5.34 MIL/uL — ABNORMAL HIGH (ref 3.87–5.11)
RDW: 13.2 % (ref 11.5–15.5)
WBC Count: 9.4 K/uL (ref 4.0–10.5)
nRBC: 0 % (ref 0.0–0.2)

## 2023-12-30 LAB — CMP (CANCER CENTER ONLY)
ALT: 17 U/L (ref 0–44)
AST: 19 U/L (ref 15–41)
Albumin: 4.2 g/dL (ref 3.5–5.0)
Alkaline Phosphatase: 69 U/L (ref 38–126)
Anion gap: 9 (ref 5–15)
BUN: 14 mg/dL (ref 8–23)
CO2: 28 mmol/L (ref 22–32)
Calcium: 9.3 mg/dL (ref 8.9–10.3)
Chloride: 100 mmol/L (ref 98–111)
Creatinine: 0.97 mg/dL (ref 0.44–1.00)
GFR, Estimated: >60 mL/min (ref >60)
Glucose, Bld: 110 mg/dL — ABNORMAL HIGH (ref 70–99)
Potassium: 3.4 mmol/L — ABNORMAL LOW (ref 3.5–5.1)
Sodium: 137 mmol/L (ref 135–145)
Total Bilirubin: 0.6 mg/dL (ref 0.0–1.2)
Total Protein: 7.4 g/dL (ref 6.5–8.1)

## 2023-12-30 MED ORDER — IOHEXOL 300 MG/ML  SOLN
75.0000 mL | Freq: Once | INTRAMUSCULAR | Status: AC | PRN
  Administered 2023-12-30: 75 mL via INTRAVENOUS

## 2023-12-30 MED ORDER — SODIUM CHLORIDE (PF) 0.9 % IJ SOLN
INTRAMUSCULAR | Status: AC
  Filled 2023-12-30: qty 50

## 2024-01-11 ENCOUNTER — Inpatient Hospital Stay (HOSPITAL_BASED_OUTPATIENT_CLINIC_OR_DEPARTMENT_OTHER): Admitting: Internal Medicine

## 2024-01-11 VITALS — BP 141/87 | HR 91 | Temp 98.3°F | Resp 18 | Ht 67.0 in | Wt 222.6 lb

## 2024-01-11 DIAGNOSIS — C3490 Malignant neoplasm of unspecified part of unspecified bronchus or lung: Secondary | ICD-10-CM | POA: Diagnosis not present

## 2024-01-11 NOTE — Progress Notes (Signed)
 [facility] [phone]   Fax:([phone]  OFFICE PROGRESS NOTE  Panosh, [name], MD [address] [place] [address]  DIAGNOSIS: stage IA (T1b, N0, M0) non-small cell lung cancer, adenocarcinoma diagnosed in April 2024.  PRIOR THERAPY: status post left upper lobectomy with lymph node sampling under the care of Dr. Shyrl on October 11, 2022 with tumor size of 1.3 cm.   CURRENT THERAPY: Observation.  INTERVAL HISTORY: Laura Smith 63 y.o. female returns to the clinic today for follow-up visit accompanied by her husband.Discussed the use of AI scribe software for clinical note transcription with the patient, who gave verbal consent to proceed.  History of Present Illness Laura Smith is a 63 year old female with stage 1A non-small cell lung cancer who presents for evaluation with repeat CT scan of the chest for restaging of her disease.  Diagnosed with stage 1A non-small cell lung cancer in April 2024, she underwent a left upper lobectomy and has been under observation since.  No new symptoms such as chest pain, shortness of breath, or cough. Additionally, no nausea, vomiting, diarrhea, or unintentional weight loss.  Experiences back pain due to arthritis and is currently attending physical therapy for management. This condition is unrelated to her lung cancer.  Concerned about potential radon exposure in her home, as she has never smoked.  MEDICAL HISTORY: Past Medical History:  Diagnosis Date   Allergy    Breast cyst    Endometriosis    surgery laser 2001   Family history of breast cancer    Family history of colon cancer    Family history of prostate cancer    Hemorrhoids    Hx of basal cell carcinoma    Hypertension    Lung cancer (HCC)    09/2022   Migraine    Thyroid  disease    Urinary tract infection     ALLERGIES:  is allergic to bee venom, etodolac, latex, other, and adhesive [tape].  MEDICATIONS:   Current Outpatient Medications  Medication Sig Dispense Refill   Ascorbic Acid (VITAMIN C) 1000 MG tablet Take 1,000 mg by mouth 2 (two) times daily.     b complex vitamins tablet Take 1 tablet by mouth daily.     CALCIUM-MAG-VIT C-VIT D PO Take 1 tablet by mouth every other day. Alternate days with calcium vitamin d     cetirizine (ZYRTEC) 10 MG tablet Take 10 mg by mouth daily.     clobetasol ointment (TEMOVATE) 0.05 % Apply 1 Application topically 2 (two) times daily as needed.     cyclobenzaprine  (FLEXERIL ) 10 MG tablet Take 10 mg by mouth 3 (three) times daily as needed for muscle spasms.     EPINEPHrine  0.3 mg/0.3 mL IJ SOAJ injection Inject 0.3 mg into the muscle as needed for anaphylaxis. 1 each 1   hydrochlorothiazide  (HYDRODIURIL ) 25 MG tablet Take 1 tablet (25 mg total) by mouth daily. 90 tablet 3   ibuprofen (ADVIL) 200 MG tablet Take 600 mg by mouth every 6 (six) hours as needed for moderate pain.     levothyroxine  (SYNTHROID ) 50 MCG tablet Take 1 tablet (50 mcg total) by mouth daily. 90 tablet 3   meclizine  (ANTIVERT ) 25 MG tablet TAKE 2 BY MOUTH TWICE DAILY AS NEEDED FOR TRAVEL 30 tablet 0   Misc Natural Products (RED WINE EXTRACT PO) Take 1 capsule by mouth daily. Red wine extract     Multiple Vitamins-Iron (MULTIVITAMIN/IRON) TABS  Take 1 tablet by mouth daily.     NURTEC 75 MG TBDP TAKE 1 TABLET BY MOUTH AS NEEDED FOR MIGRAINES 8 tablet 12   nystatin  powder Apply topically 2 (two) times daily. As needed for rash 30 g 5   sulindac  (CLINORIL ) 200 MG tablet Take 1 tablet (200 mg total) by mouth 2 (two) times daily as needed (headache). Reported on 09/23/2015 30 tablet 5   traMADol  (ULTRAM ) 50 MG tablet Take 50 mg by mouth every 6 (six) hours as needed for moderate pain.     verapamil  (CALAN -SR) 180 MG CR tablet Take 1 tablet (180 mg total) by mouth at bedtime. 90 tablet 3   vitamin E 180 MG (400 UNITS) capsule Take 400 Units by mouth daily.     Current Facility-Administered  Medications  Medication Dose Route Frequency Provider Last Rate Last Admin   methylPREDNISolone  acetate (DEPO-MEDROL ) injection 120 mg  120 mg Intramuscular Once Panosh, [name], MD        SURGICAL HISTORY:  Past Surgical History:  Procedure Laterality Date   APPENDECTOMY  1992   AT C/S   BACK SURGERY  06/06/2020   L3-4   BRONCHIAL BIOPSY  10/14/2022   Procedure: BRONCHIAL BIOPSIES;  Surgeon: Brenna Adine CROME, DO;  Location: [facility];  Service: Pulmonary;;   CESAREAN SECTION  1992   APPENDECTOMY SAME TIME   COLONOSCOPY  2015   FIDUCIAL MARKER PLACEMENT  10/14/2022   Procedure: FIDUCIAL DYE MARKING;  Surgeon: Brenna Adine CROME, DO;  Location: [facility];  Service: Pulmonary;;   HYSTEROSCOPY  08/1999   AT TIME OF LAPAROSCOPY   INTERCOSTAL NERVE BLOCK Left 10/14/2022   Procedure: INTERCOSTAL NERVE BLOCK;  Surgeon: Shyrl Linnie KIDD, MD;  Location: [facility] OR;  Service: Thoracic;  Laterality: Left;   LUNG SURGERY Left    09/2022/ left lobe removal   NODE DISSECTION Left 10/14/2022   Procedure: NODE DISSECTION;  Surgeon: Shyrl Linnie KIDD, MD;  Location: [facility] OR;  Service: Thoracic;  Laterality: Left;   PELVIC LAPAROSCOPY  08/1999   AND HYSTEROSCOPY--ENDOMETRIOSIS   POLYPECTOMY      REVIEW OF SYSTEMS:  A comprehensive review of systems was negative except for: Musculoskeletal: positive for back pain   PHYSICAL EXAMINATION: General appearance: alert, cooperative, and no distress Head: Normocephalic, without obvious abnormality, atraumatic Neck: no adenopathy, no JVD, supple, symmetrical, trachea midline, and thyroid  not enlarged, symmetric, no tenderness/mass/nodules Lymph nodes: Cervical, supraclavicular, and axillary nodes normal. Resp: clear to auscultation bilaterally Back: symmetric, no curvature. ROM normal. No CVA tenderness. Cardio: regular rate and rhythm, S1, S2 normal, no murmur, click, rub or gallop GI: soft, non-tender; bowel sounds normal; no masses,  no  organomegaly Extremities: extremities normal, atraumatic, no cyanosis or edema  ECOG PERFORMANCE STATUS: 1 - Symptomatic but completely ambulatory  Blood pressure (!) 141/87, pulse 91, temperature 98.3 F (36.8 C), temperature source Oral, resp. rate 18, height 5' 7 (1.702 m), weight 222 lb 9.6 oz (101 kg), SpO2 96%.  LABORATORY DATA: Lab Results  Component Value Date   WBC 9.4 12/30/2023   HGB 15.6 (H) 12/30/2023   HCT 45.4 12/30/2023   MCV 85.0 12/30/2023   PLT 371 12/30/2023      Chemistry      Component Value Date/Time   NA 137 12/30/2023 1232   K 3.4 (L) 12/30/2023 1232   CL 100 12/30/2023 1232   CO2 28 12/30/2023 1232   BUN 14 12/30/2023 1232   CREATININE 0.97 12/30/2023 1232  CREATININE 0.94 05/24/2020 0914      Component Value Date/Time   CALCIUM 9.3 12/30/2023 1232   ALKPHOS 69 12/30/2023 1232   AST 19 12/30/2023 1232   ALT 17 12/30/2023 1232   BILITOT 0.6 12/30/2023 1232       RADIOGRAPHIC STUDIES: CT Chest W Contrast Result Date: 01/03/2024 CLINICAL DATA:  Non-small cell lung cancer, follow-up. * Tracking Code: BO * EXAM: CT CHEST WITH CONTRAST TECHNIQUE: Multidetector CT imaging of the chest was performed during intravenous contrast administration. RADIATION DOSE REDUCTION: This exam was performed according to the departmental dose-optimization program which includes automated exposure control, adjustment of the mA and/or kV according to patient size and/or use of iterative reconstruction technique. CONTRAST:  75mL OMNIPAQUE  IOHEXOL  300 MG/ML  SOLN COMPARISON:  Multiple priors including CT June 14, 2023 FINDINGS: Cardiovascular: Normal caliber thoracic aorta. Normal size heart. No significant pericardial effusion/thickening. Mediastinum/Nodes: No suspicious thyroid  nodule. No pathologically enlarged mediastinal, hilar or axillary lymph nodes. The esophagus is grossly unremarkable. Lungs/Pleura: Postsurgical change of left upper lobectomy without new  suspicious nodularity along the suture line. Stable 5 mm ground-glass right upper lobe pulmonary nodule on image 25/7. Stable 3 mm solid posterior right upper lobe pulmonary nodule on image 39/7. Stable 2 mm left lower lobe pulmonary nodule on image 64/7. No new suspicious pulmonary nodules or masses. Upper Abdomen: No suspicious adrenal nodule/mass. No acute abnormality. Musculoskeletal: No aggressive lytic or blastic lesion of bone. IMPRESSION: 1. Postsurgical change of left upper lobectomy without evidence of local recurrence. 2. Stable small bilateral pulmonary nodules. No new suspicious pulmonary nodules or masses. Electronically Signed   By: Reyes Holder M.D.   On: 01/03/2024 10:00    ASSESSMENT AND PLAN: This is a very pleasant 63 years old white female diagnosed with stage Ia (T1b, N0, M0) non-small cell lung cancer, adenocarcinoma status post left upper lobectomy with lymph node sampling under the care of Dr. Shyrl on October 11, 2022 with tumor size of 1.3 cm.  The patient is currently on observation and she is feeling fine with no concerning complaints. She had repeat CT scan of the chest performed recently.  I personally and independently reviewed the scan and discussed the result with the patient today.  Her scan showed no concerning findings for disease recurrence or metastasis. Assessment and Plan Assessment & Plan Stage 1A non-small cell lung cancer Status post left upper lobectomy in April 2024. Currently under observation with well-managed disease. Recent CT scan shows no new changes, with well-managed tiny nodules in the lungs. - Continue observation with CT scan of the chest every six months for the next two years. - Perform blood work every six months for the first two years.  Environmental risk factors for lung cancer Discussion about potential environmental risk factors for lung cancer, specifically radon exposure. - Advise testing for radon levels in the home using a radon  testing kit available from home improvement stores or online.  Back pain due to arthritis Chronic back pain secondary to arthritis with exacerbation of symptoms. - Continue physical therapy for back pain management. The patient was advised to call immediately if she has any concerning symptoms in the interval.  The patient voices understanding of current disease status and treatment options and is in agreement with the current care plan.  All questions were answered. The patient knows to call the clinic with any problems, questions or concerns. We can certainly see the patient much sooner if necessary.  The total time spent  in the appointment was 20 minutes.  Disclaimer: This note was dictated with voice recognition software. Similar sounding words can inadvertently be transcribed and may not be corrected upon review.

## 2024-01-12 ENCOUNTER — Other Ambulatory Visit: Payer: Self-pay | Admitting: Family

## 2024-01-12 ENCOUNTER — Encounter: Payer: Self-pay | Admitting: Internal Medicine

## 2024-01-12 ENCOUNTER — Telehealth: Payer: Self-pay | Admitting: Internal Medicine

## 2024-01-12 MED ORDER — MECLIZINE HCL 25 MG PO TABS: ORAL_TABLET | ORAL | 0 refills | Status: AC

## 2024-01-12 NOTE — Telephone Encounter (Signed)
Scheduled appointments with the patient

## 2024-01-28 LAB — HM MAMMOGRAPHY

## 2024-04-24 ENCOUNTER — Telehealth: Payer: Self-pay

## 2024-04-24 ENCOUNTER — Other Ambulatory Visit (HOSPITAL_COMMUNITY): Payer: Self-pay

## 2024-04-24 NOTE — Telephone Encounter (Signed)
 Pharmacy Patient Advocate Encounter   Received notification from CoverMyMeds that prior authorization for Nurtec 75MG  dispersible tablets is required/requested.   Insurance verification completed.   The patient is insured through [facility].   Per test claim: xxx

## 2024-04-26 ENCOUNTER — Other Ambulatory Visit (HOSPITAL_COMMUNITY): Payer: Self-pay

## 2024-04-27 MED ORDER — UBRELVY 50 MG PO TABS: 50.0000 mg | ORAL_TABLET | ORAL | 6 refills | Status: AC | PRN

## 2024-04-27 NOTE — Telephone Encounter (Signed)
 Pharmacy Patient Advocate Encounter  Received notification from [facility] [employer] ([employer]ERCIAL) that Prior Authorization for Nurtec has been DENIED.  Full denial letter will be uploaded to the media tab. See denial reason below.   PA #/Case ID/Reference #: N/A

## 2024-04-27 NOTE — Telephone Encounter (Signed)
 Dr. Margaret, it looks like based on denial letter, they want her to try/fail [place] first (I do not see where she has after reviewing chart) before they will cover Nurtec.  Would you like to change migraine rescue to this instead?

## 2024-05-03 ENCOUNTER — Ambulatory Visit (INDEPENDENT_AMBULATORY_CARE_PROVIDER_SITE_OTHER)

## 2024-05-03 DIAGNOSIS — Z23 Encounter for immunization: Secondary | ICD-10-CM

## 2024-05-12 ENCOUNTER — Telehealth: Payer: Self-pay

## 2024-05-12 ENCOUNTER — Other Ambulatory Visit (HOSPITAL_COMMUNITY): Payer: Self-pay

## 2024-05-12 NOTE — Telephone Encounter (Signed)
 Pharmacy Patient Advocate Encounter   Received notification from CoverMyMeds that prior authorization for Ubrelvy  is required/requested.   Insurance verification completed.   The patient is insured through [employer].   Per test claim: PA required; PA submitted to above mentioned insurance via Latent Key/confirmation #/EOC [id number] Status is pending

## 2024-05-16 ENCOUNTER — Encounter: Payer: Self-pay | Admitting: Pulmonary Disease

## 2024-05-16 ENCOUNTER — Ambulatory Visit (INDEPENDENT_AMBULATORY_CARE_PROVIDER_SITE_OTHER): Admitting: Pulmonary Disease

## 2024-05-16 VITALS — BP 134/85 | HR 79 | Ht 67.5 in | Wt 224.0 lb

## 2024-05-16 DIAGNOSIS — R918 Other nonspecific abnormal finding of lung field: Secondary | ICD-10-CM | POA: Diagnosis not present

## 2024-05-16 DIAGNOSIS — C3492 Malignant neoplasm of unspecified part of left bronchus or lung: Secondary | ICD-10-CM

## 2024-05-16 NOTE — Telephone Encounter (Signed)
 Pharmacy Patient Advocate Encounter  Received notification from [facility] [employer] (COMMERCIAL) that Prior Authorization for Ubrelvy  has been DENIED.  Full denial letter will be uploaded to the media tab. See denial reason below.   PA #/Case ID/Reference #: [id number]  PA was denied for an incorrect reason-PT is not taking both Ubrelvy  and Nurtec-I have resubmitted and explained this and also sent the telephone note discussing the denial of Nurtec and must trial Ubrelvy . Awaiting determination.

## 2024-05-16 NOTE — Patient Instructions (Addendum)
 Your CT Chest scan shows stable lung nodules  We will continue to follow your lung nodules annually  Follow up in 1 year, call sooner if needed

## 2024-05-16 NOTE — Progress Notes (Signed)
 Established Patient Pulmonology Office Visit   Subjective:  Patient ID: Laura Smith, female    DOB: [birth year]  MRN: [id number]  CC:  Chief Complaint  Patient presents with   Medical Management of Chronic Issues    Pt states no new concerns    Discussed the use of AI scribe software for clinical note transcription with the patient, who gave verbal consent to proceed.  History of Present Illness AKYLAH Smith is a 63 year old female with non-small cell lung cancer who presents for follow-up after left upper lobectomy.  Diagnosed with non-small cell lung cancer in April 2024, she underwent a left upper lobectomy and is on CT surveillance. Her last CT on December 30, 2023 showed no new pulmonary nodules and stable small bilateral nodules. CT scans have shown three small nodules, two in the right upper lobe and one in the left lower lobe, measuring 2 to 5 mm.  She has shortness of breath when climbing stairs that resolves with rest. She has no persistent cough or wheezing.  Her grandmother died of lung cancer, and she had secondhand smoke exposure from her first husband and his family. She has never smoked.        ROS    Current Outpatient Medications:    Ascorbic Acid (VITAMIN C) 1000 MG tablet, Take 1,000 mg by mouth 2 (two) times daily., Disp: , Rfl:    b complex vitamins tablet, Take 1 tablet by mouth daily., Disp: , Rfl:    CALCIUM-MAG-VIT C-VIT D PO, Take 1 tablet by mouth every other day. Alternate days with calcium vitamin d, Disp: , Rfl:    cetirizine (ZYRTEC) 10 MG tablet, Take 10 mg by mouth daily., Disp: , Rfl:    clobetasol ointment (TEMOVATE) 0.05 %, Apply 1 Application topically 2 (two) times daily as needed., Disp: , Rfl:    cyclobenzaprine  (FLEXERIL ) 10 MG tablet, Take 10 mg by mouth 3 (three) times daily as needed for muscle spasms., Disp: , Rfl:    EPINEPHrine  0.3 mg/0.3 mL IJ SOAJ injection, Inject 0.3 mg into the muscle as needed for anaphylaxis., Disp: 1  each, Rfl: 1   hydrochlorothiazide  (HYDRODIURIL ) 25 MG tablet, Take 1 tablet (25 mg total) by mouth daily., Disp: 90 tablet, Rfl: 3   ibuprofen (ADVIL) 200 MG tablet, Take 600 mg by mouth every 6 (six) hours as needed for moderate pain., Disp: , Rfl:    levothyroxine  (SYNTHROID ) 50 MCG tablet, Take 1 tablet (50 mcg total) by mouth daily., Disp: 90 tablet, Rfl: 3   meclizine  (ANTIVERT ) 25 MG tablet, TAKE 2 BY MOUTH TWICE DAILY AS NEEDED FOR TRAVEL, Disp: 30 tablet, Rfl: 0   Misc Natural Products (RED WINE EXTRACT PO), Take 1 capsule by mouth daily. Red wine extract, Disp: , Rfl:    Multiple Vitamins-Iron (MULTIVITAMIN/IRON) TABS, Take 1 tablet by mouth daily., Disp: , Rfl:    nystatin  powder, Apply topically 2 (two) times daily. As needed for rash, Disp: 30 g, Rfl: 5   sulindac  (CLINORIL ) 200 MG tablet, Take 1 tablet (200 mg total) by mouth 2 (two) times daily as needed (headache). Reported on 09/23/2015, Disp: 30 tablet, Rfl: 5   traMADol  (ULTRAM ) 50 MG tablet, Take 50 mg by mouth every 6 (six) hours as needed for moderate pain., Disp: , Rfl:    Ubrogepant  (UBRELVY ) 50 MG TABS, Take 1 tablet (50 mg total) by mouth as needed. May repeat x 1 tab after 2 hours; max 2 tabs per  day or 8 per month, Disp: 8 tablet, Rfl: 6   verapamil  (CALAN -SR) 180 MG CR tablet, Take 1 tablet (180 mg total) by mouth at bedtime., Disp: 90 tablet, Rfl: 3   vitamin E 180 MG (400 UNITS) capsule, Take 400 Units by mouth daily., Disp: , Rfl:    NURTEC 75 MG TBDP, TAKE 1 TABLET BY MOUTH AS NEEDED FOR MIGRAINES (Patient not taking: Reported on 05/16/2024), Disp: 8 tablet, Rfl: 12  Current Facility-Administered Medications:    methylPREDNISolone  acetate (DEPO-MEDROL ) injection 120 mg, 120 mg, Intramuscular, Once, Panosh, [name], MD      Objective:  BP 134/85   Pulse 79   Ht 5' 7.5 (1.715 m) Comment: per pt  Wt 224 lb (101.6 kg)   SpO2 95%   BMI 34.57 kg/m     Physical Exam Constitutional:      General: She is not in  acute distress.    Appearance: Normal appearance.  Eyes:     General: No scleral icterus.    Conjunctiva/sclera: Conjunctivae normal.  Cardiovascular:     Rate and Rhythm: Normal rate and regular rhythm.  Pulmonary:     Breath sounds: No wheezing, rhonchi or rales.  Musculoskeletal:     Right lower leg: No edema.     Left lower leg: No edema.  Skin:    General: Skin is warm and dry.  Neurological:     General: No focal deficit present.      Diagnostic Review:    CT Chest 12/30/23 1. Postsurgical change of left upper lobectomy without evidence of local recurrence. 2. Stable small bilateral pulmonary nodules. No new suspicious pulmonary nodules or masses.     Assessment & Plan:   Assessment & Plan Lung nodules     Non-small cell cancer of left lung (HCC)      Assessment and Plan Assessment & Plan Non-small cell lung cancer, status post left upper lobectomy, under CT surveillance Status post left upper lobectomy for non-small cell lung cancer. CT surveillance shows stable small bilateral pulmonary nodules with no new nodules. Occasional exertional dyspnea resolves with rest. - Continue CT surveillance as scheduled by oncology - Monitor for new symptoms or changes in respiratory status.  Bilateral pulmonary nodules under surveillance Three small pulmonary nodules (2-5 mm) stable on CT. Differential includes benign causes; malignancy not excluded without growth or new nodules. - Continue regular CT surveillance to monitor nodule size and characteristics. - Consider biopsy if nodules grow or new nodules appear.      Return in about 1 year (around 05/16/2025) for f/u visit Dr. Kara.   Dorn KATHEE Kara, MD

## 2024-05-19 ENCOUNTER — Other Ambulatory Visit (HOSPITAL_COMMUNITY): Payer: Self-pay

## 2024-05-19 NOTE — Telephone Encounter (Signed)
 Pharmacy Patient Advocate Encounter  Received notification from [facility] [employer] that Prior Authorization for Ubrelvy  has been APPROVED from 05/16/2024 to 11/13/2024. Ran test claim, Copay is $0. This test claim was processed through [facility]- copay amounts may vary at other pharmacies due to pharmacy/plan contracts, or as the patient moves through the different stages of their insurance plan.   PA #/Case ID/Reference #: [id number]

## 2024-06-20 ENCOUNTER — Ambulatory Visit (INDEPENDENT_AMBULATORY_CARE_PROVIDER_SITE_OTHER): Payer: BC Managed Care – PPO | Admitting: Internal Medicine

## 2024-06-20 ENCOUNTER — Encounter: Payer: Self-pay | Admitting: Internal Medicine

## 2024-06-20 VITALS — BP 114/92 | HR 77 | Temp 97.6°F | Ht 66.35 in | Wt 224.4 lb

## 2024-06-20 DIAGNOSIS — C3412 Malignant neoplasm of upper lobe, left bronchus or lung: Secondary | ICD-10-CM | POA: Diagnosis not present

## 2024-06-20 DIAGNOSIS — Z79899 Other long term (current) drug therapy: Secondary | ICD-10-CM | POA: Diagnosis not present

## 2024-06-20 DIAGNOSIS — E039 Hypothyroidism, unspecified: Secondary | ICD-10-CM

## 2024-06-20 DIAGNOSIS — Z91030 Bee allergy status: Secondary | ICD-10-CM | POA: Diagnosis not present

## 2024-06-20 DIAGNOSIS — Z0000 Encounter for general adult medical examination without abnormal findings: Secondary | ICD-10-CM

## 2024-06-20 DIAGNOSIS — Z23 Encounter for immunization: Secondary | ICD-10-CM | POA: Diagnosis not present

## 2024-06-20 DIAGNOSIS — I10 Essential (primary) hypertension: Secondary | ICD-10-CM

## 2024-06-20 LAB — LIPID PANEL
Cholesterol: 212 mg/dL — ABNORMAL HIGH (ref 28–200)
HDL: 55.6 mg/dL
LDL Cholesterol: 127 mg/dL — ABNORMAL HIGH (ref 10–99)
NonHDL: 155.95
Total CHOL/HDL Ratio: 4
Triglycerides: 144 mg/dL (ref 10.0–149.0)
VLDL: 28.8 mg/dL (ref 0.0–40.0)

## 2024-06-20 LAB — TSH: TSH: 2.16 u[IU]/mL (ref 0.35–5.50)

## 2024-06-20 LAB — BASIC METABOLIC PANEL WITH GFR
BUN: 13 mg/dL (ref 6–23)
CO2: 33 meq/L — ABNORMAL HIGH (ref 19–32)
Calcium: 9.5 mg/dL (ref 8.4–10.5)
Chloride: 100 meq/L (ref 96–112)
Creatinine, Ser: 0.88 mg/dL (ref 0.40–1.20)
GFR: 69.47 mL/min
Glucose, Bld: 97 mg/dL (ref 70–99)
Potassium: 3.9 meq/L (ref 3.5–5.1)
Sodium: 140 meq/L (ref 135–145)

## 2024-06-20 MED ORDER — EPINEPHRINE 0.3 MG/0.3ML IJ SOAJ: 0.3000 mg | INTRAMUSCULAR | 1 refills | Status: AC | PRN

## 2024-06-20 MED ORDER — LEVOTHYROXINE SODIUM 50 MCG PO TABS: 50.0000 ug | ORAL_TABLET | Freq: Every day | ORAL | 3 refills | Status: AC

## 2024-06-20 MED ORDER — VERAPAMIL HCL ER 180 MG PO TBCR: 180.0000 mg | EXTENDED_RELEASE_TABLET | Freq: Every day | ORAL | 3 refills | Status: AC

## 2024-06-20 MED ORDER — HYDROCHLOROTHIAZIDE 25 MG PO TABS: 25.0000 mg | ORAL_TABLET | Freq: Every day | ORAL | 3 refills | Status: AC

## 2024-06-20 NOTE — Addendum Note (Signed)
 Addended by: Darwin Rothlisberger on: 06/20/2024 10:49 AM   Modules accepted: Orders

## 2024-06-20 NOTE — Patient Instructions (Signed)
 Good to see you today  Bp goal is average below 130/80 at this time  Continue same meds for now and can send  bp readings over 3-5 days    I agree shop for less expensive  cost of meds  as they are generics.  Need lipid tsh  labs today .  Play yearly check   Prevnar 20 today

## 2024-06-20 NOTE — Progress Notes (Signed)
 "  Chief Complaint  Patient presents with   Annual Exam    HPI: Patient  Laura Smith  63 y.o. comes in today for Preventive Health Care visit  And med heck  Thyroid  ; takes meds daily has ? S. Good  mom had partial removal and on meds checked q 6 mos . Lung cancer  no evidence following  oncoloby HT  meds  cost  30 $ even if generic  No cv sx pulmonary stable .   Health Maintenance  Topic Date Due   Pneumococcal Vaccine: 50+ Years (1 of 2 - PCV) Never done   Cervical Cancer Screening (HPV/Pap Cotest)  07/04/2024   COVID-19 Vaccine (4 - 2025-26 season) 07/06/2024 (Originally 02/21/2024)   Mammogram  01/27/2025   Colonoscopy  08/14/2025   DTaP/Tdap/Td (3 - Td or Tdap) 04/29/2026   Influenza Vaccine  Completed   Hepatitis C Screening  Completed   HIV Screening  Completed   Zoster Vaccines- Shingrix  Completed   Hepatitis B Vaccines 19-59 Average Risk  Aged Out   HPV VACCINES  Aged Out   Meningococcal B Vaccine  Aged Out   Health Maintenance Review LIFESTYLE:  Exercise:  walks Tobacco/ETS: Alcohol: n Sugar beverages: 1 per day .  Sleep: 7+ hours  Drug use: no HH of  2  Work: let go . From job  October last Has better since  not at work     ROS:  GEN/ HEENT: No fever, significant weight changes sweats headaches vision problems hearing changes, CV/ PULM; No chest pain shortness of breath cough, syncope,edema  change in exercise tolerance. GI /GU: No adominal pain, vomiting, change in bowel habits. No blood in the stool. No significant GU symptoms. SKIN/HEME: ,no acute skin rashes suspicious lesions or bleeding. No lymphadenopathy, nodules, masses.  NEURO/ PSYCH:  No neurologic signs such as weakness numbness. No depression anxiety. IMM/ Allergy: No unusual infections.  Allergy .   REST of 12 system review negative except as per HPI   Past Medical History:  Diagnosis Date   Allergy    Breast cyst    Endometriosis    surgery laser 2001   Family history of breast  cancer    Family history of colon cancer    Family history of prostate cancer    Hemorrhoids    Hx of basal cell carcinoma    Hypertension    Lung cancer (HCC)    09/2022   Migraine    Thyroid  disease    Urinary tract infection     Past Surgical History:  Procedure Laterality Date   APPENDECTOMY  1992   AT C/S   BACK SURGERY  06/06/2020   L3-4   BRONCHIAL BIOPSY  10/14/2022   Procedure: BRONCHIAL BIOPSIES;  Surgeon: Brenna Adine LITTIE, DO;  Location: [facility];  Service: Pulmonary;;   CESAREAN SECTION  1992   APPENDECTOMY SAME TIME   COLONOSCOPY  2015   FIDUCIAL MARKER PLACEMENT  10/14/2022   Procedure: FIDUCIAL DYE MARKING;  Surgeon: Brenna Adine LITTIE, DO;  Location: [facility];  Service: Pulmonary;;   HYSTEROSCOPY  08/1999   AT TIME OF LAPAROSCOPY   INTERCOSTAL NERVE BLOCK Left 10/14/2022   Procedure: INTERCOSTAL NERVE BLOCK;  Surgeon: Shyrl Linnie KIDD, MD;  Location: [facility] OR;  Service: Thoracic;  Laterality: Left;   LUNG SURGERY Left    09/2022/ left lobe removal   NODE DISSECTION Left 10/14/2022   Procedure: NODE DISSECTION;  Surgeon: Shyrl Linnie KIDD, MD;  Location: [facility] OR;  Service: Thoracic;  Laterality: Left;   PELVIC LAPAROSCOPY  08/1999   AND HYSTEROSCOPY--ENDOMETRIOSIS   POLYPECTOMY      Family History  Problem Relation Age of Onset   Breast cancer Mother        Age 19   Colon cancer Mother 57   Breast cancer Cousin 58       paternal first cousin   Heart disease Father 39       Heart attack   Hypertension Paternal Grandmother    Heart disease Paternal Grandmother    Stroke Paternal Grandmother    Cancer Paternal Grandmother        Lung   Cancer Paternal Grandfather        Prostate   Stroke Maternal Grandfather    COPD Maternal Uncle    Liver cancer Paternal Uncle    Congestive Heart Failure Maternal Grandmother    Hypertension Paternal Aunt    Colon polyps Neg Hx    Stomach cancer Neg Hx    Rectal cancer Neg Hx    Esophageal cancer Neg Hx      Social History   Socioeconomic History   Marital status: Married    Spouse name: Signe   Number of children: 1   Years of education: Not on file   Highest education level: Associate degree: occupational, scientist, product/process development, or vocational program  Occupational History   Not on file  Tobacco Use   Smoking status: Never   Smokeless tobacco: Never  Vaping Use   Vaping status: Never Used  Substance and Sexual Activity   Alcohol use: No    Alcohol/week: 0.0 standard drinks of alcohol   Drug use: No   Sexual activity: Not Currently    Partners: Male    Birth control/protection: Post-menopausal, Abstinence  Other Topics Concern   Not on file  Social History Narrative   h h of 2 Pet cat   Daughter /  Married    g1 p1   Works at [employer]   Social Drivers of Health   Tobacco Use: Low Risk (06/20/2024)   Patient History    Smoking Tobacco Use: Never    Smokeless Tobacco Use: Never    Passive Exposure: Not on file  Financial Resource Strain: Low Risk (06/20/2024)   Overall Financial Resource Strain (CARDIA)    Difficulty of Paying Living Expenses: Not hard at all  Food Insecurity: No Food Insecurity (10/16/2022)   Hunger Vital Sign    Worried About Running Out of Food in the Last Year: Never true    Ran Out of Food in the Last Year: Never true  Transportation Needs: No Transportation Needs (10/16/2022)   PRAPARE - Administrator, Civil Service (Medical): No    Lack of Transportation (Non-Medical): No  Physical Activity: Insufficiently Active (06/20/2024)   Exercise Vital Sign    Days of Exercise per Week: 3 days    Minutes of Exercise per Session: 40 min  Stress: No Stress Concern Present (06/20/2024)   [employer] of Occupational Health - Occupational Stress Questionnaire    Feeling of Stress: Not at all  Social Connections: Socially Integrated (06/15/2023)   Social Connection and Isolation Panel    Frequency of Communication with Friends and Family: More  than three times a week    Frequency of Social Gatherings with Friends and Family: More than three times a week    Attends Religious Services: More than 4 times per year  Active Member of Clubs or Organizations: Yes    Attends Banker Meetings: More than 4 times per year    Marital Status: Married  Depression (PHQ2-9): Low Risk (06/20/2024)   Depression (PHQ2-9)    PHQ-2 Score: 0  Alcohol Screen: Low Risk (06/20/2024)   Alcohol Screen    Last Alcohol Screening Score (AUDIT): 0  Housing: Not on file  Utilities: Not At Risk (06/20/2024)   Epic    Threatened with loss of utilities: No  Health Literacy: Adequate Health Literacy (06/20/2024)   B1300 Health Literacy    Frequency of need for help with medical instructions: Never    Outpatient Medications Prior to Visit  Medication Sig Dispense Refill   Ascorbic Acid (VITAMIN C) 1000 MG tablet Take 1,000 mg by mouth 2 (two) times daily.     b complex vitamins tablet Take 1 tablet by mouth daily.     CALCIUM-MAG-VIT C-VIT D PO Take 1 tablet by mouth every other day. Alternate days with calcium vitamin d     cetirizine (ZYRTEC) 10 MG tablet Take 10 mg by mouth daily.     clobetasol ointment (TEMOVATE) 0.05 % Apply 1 Application topically 2 (two) times daily as needed.     cyclobenzaprine  (FLEXERIL ) 10 MG tablet Take 10 mg by mouth 3 (three) times daily as needed for muscle spasms.     ibuprofen (ADVIL) 200 MG tablet Take 600 mg by mouth every 6 (six) hours as needed for moderate pain.     meclizine  (ANTIVERT ) 25 MG tablet TAKE 2 BY MOUTH TWICE DAILY AS NEEDED FOR TRAVEL 30 tablet 0   Misc Natural Products (RED WINE EXTRACT PO) Take 1 capsule by mouth daily. Red wine extract     Multiple Vitamins-Iron (MULTIVITAMIN/IRON) TABS Take 1 tablet by mouth daily.     nystatin  powder Apply topically 2 (two) times daily. As needed for rash 30 g 5   sulindac  (CLINORIL ) 200 MG tablet Take 1 tablet (200 mg total) by mouth 2 (two) times daily  as needed (headache). Reported on 09/23/2015 30 tablet 5   traMADol  (ULTRAM ) 50 MG tablet Take 50 mg by mouth every 6 (six) hours as needed for moderate pain.     Ubrogepant  (UBRELVY ) 50 MG TABS Take 1 tablet (50 mg total) by mouth as needed. May repeat x 1 tab after 2 hours; max 2 tabs per day or 8 per month 8 tablet 6   vitamin E 180 MG (400 UNITS) capsule Take 400 Units by mouth daily.     EPINEPHrine  0.3 mg/0.3 mL IJ SOAJ injection Inject 0.3 mg into the muscle as needed for anaphylaxis. 1 each 1   hydrochlorothiazide  (HYDRODIURIL ) 25 MG tablet Take 1 tablet (25 mg total) by mouth daily. 90 tablet 3   levothyroxine  (SYNTHROID ) 50 MCG tablet Take 1 tablet (50 mcg total) by mouth daily. 90 tablet 3   verapamil  (CALAN -SR) 180 MG CR tablet Take 1 tablet (180 mg total) by mouth at bedtime. 90 tablet 3   NURTEC 75 MG TBDP TAKE 1 TABLET BY MOUTH AS NEEDED FOR MIGRAINES (Patient not taking: Reported on 06/20/2024) 8 tablet 12   Facility-Administered Medications Prior to Visit  Medication Dose Route Frequency Provider Last Rate Last Admin   methylPREDNISolone  acetate (DEPO-MEDROL ) injection 120 mg  120 mg Intramuscular Once Weslee Prestage K, MD         EXAM:  BP (!) 114/92 (BP Location: Left Arm, Patient Position: Sitting, Cuff Size: Large)   Pulse  77   Temp 97.6 F (36.4 C) (Oral)   Ht 5' 6.35 (1.685 m)   Wt 224 lb 6.4 oz (101.8 kg)   SpO2 96%   BMI 35.84 kg/m   Body mass index is 35.84 kg/m. Wt Readings from Last 3 Encounters:  06/20/24 224 lb 6.4 oz (101.8 kg)  05/16/24 224 lb (101.6 kg)  01/11/24 222 lb 9.6 oz (101 kg)   BP Readings from Last 3 Encounters:  06/20/24 (!) 114/92  05/16/24 134/85  01/11/24 (!) 141/87     Physical Exam: Vital signs reviewed HZW:Uypd is a well-developed well-nourished alert cooperative    who appearsr stated age in no acute distress.  HEENT: normocephalic atraumatic , Eyes: PERRL EOM's full, conjunctiva clear, Nares: paten,t no deformity  discharge or tenderness., Ears: no deformity EAC's clear TMs with normal landmarks. Mouth: clear OP, no lesions, edema.  Moist mucous membranes. Dentition in adequate repair. NECK: supple without masses, thyromegaly or bruits. CHEST/PULM:  Clear to auscultation and percussion breath sounds equal no wheeze , rales or rhonchi. No chest wall deformities or tenderness. Breast: normal by inspection . No dimpling, discharge, masses, tenderness or discharge . CV: PMI is nondisplaced, S1 S2 no gallops, murmurs, rubs. Peripheral pulses are full without delay.No JVD .  ABDOMEN: Bowel sounds normal nontender  No guard or rebound, no hepato splenomegal no CVA tenderness.  No hernia. Extremtities:  No clubbing cyanosis or edema, no acute joint swelling or redness no focal atrophy NEURO:  Oriented x3, cranial nerves 3-12 appear to be intact, no obvious focal weakness,gait within normal limits no abnormal reflexes or asymmetrical SKIN: No acute rashes normal turgor, color, no bruising or petechiae. PSYCH: Oriented, good eye contact, no obvious depression anxiety, cognition and judgment appear normal. LN: no cervical axillary inguinal adenopathy  Lab Results  Component Value Date   WBC 9.4 12/30/2023   HGB 15.6 (H) 12/30/2023   HCT 45.4 12/30/2023   PLT 371 12/30/2023   GLUCOSE 110 (H) 12/30/2023   CHOL 195 06/15/2023   TRIG 98.0 06/15/2023   HDL 60.60 06/15/2023   LDLDIRECT 133.8 04/07/2013   LDLCALC 115 (H) 06/15/2023   ALT 17 12/30/2023   AST 19 12/30/2023   NA 137 12/30/2023   K 3.4 (L) 12/30/2023   CL 100 12/30/2023   CREATININE 0.97 12/30/2023   BUN 14 12/30/2023   CO2 28 12/30/2023   TSH 2.42 06/15/2023   INR 1.0 10/12/2022   HGBA1C 6.0 06/15/2023    BP Readings from Last 3 Encounters:  06/20/24 (!) 114/92  05/16/24 134/85  01/11/24 (!) 141/87    Lab results reviewed with patient   ASSESSMENT AND PLAN:  Discussed the following assessment and plan:    ICD-10-CM   1. Visit for  preventive health examination  Z00.00 TSH    Lipid panel    Basic metabolic panel with GFR    2. Hypothyroidism, unspecified type  E03.9 levothyroxine  (SYNTHROID ) 50 MCG tablet    TSH    Lipid panel    Basic metabolic panel with GFR    3. Hypertension, unspecified type  I10 TSH    Lipid panel    Basic metabolic panel with GFR    4. Medication management  Z79.899 TSH    Lipid panel    Basic metabolic panel with GFR    5. Adenocarcinoma of upper lobe of left lung (HCC)  C34.12     6. H/O bee sting allergy  Z91.030    very large reaction  and ed visit told should have epi pen never had to use    Prevnar 20 advised today  Bp control discussed . Feels ok at home  Disc cost of generic meds  30 per med  No longer employed and cost high in marketplace .  Avoid excess sugar beverages    Allergic to bee stings never had to use epi pen but on hand  uses zyrtec  ok to use as  needed also  Return in about 1 year (around 06/20/2025) for depending on results and  bp control.  Patient Care Team: Jahni Paul, [name], MD as PCP - General (Internal Medicine) Joshua Blamer, MD as Consulting Physician (Dermatology) Darcie Fallow, OD as Referring Physician (Optometry) Prentiss Annabella LABOR, NP as Nurse Practitioner (Gynecology) Brenna Adine CROME, DO as Consulting Physician (Pulmonary Disease) Patient Instructions  Good to see you today  Bp goal is average below 130/80 at this time  Continue same meds for now and can send  bp readings over 3-5 days    I agree shop for less expensive  cost of meds  as they are generics.  Need lipid tsh  labs today .  Play yearly check   Prevnar 20 today  Nupur Hohman K. Niranjan Rufener M.D.  7 25  oncology eval  Stage 1A non-small cell lung cancer Status post left upper lobectomy in April 2024. Currently under observation with well-managed disease. Recent CT scan shows no new changes, with well-managed tiny nodules in the lungs. - Continue observation with CT scan of the chest  every six months for the next two years. - Perform blood work every six months for the first two years. "

## 2024-06-26 ENCOUNTER — Ambulatory Visit: Payer: Self-pay | Admitting: Internal Medicine

## 2024-06-26 NOTE — Progress Notes (Signed)
 Thyroid  in range  Kidney function normal Cholesterol up some from last year  ldl 127  Continued attention to healthy  diet and activity . For control  No other interventions  advised .

## 2024-07-04 ENCOUNTER — Inpatient Hospital Stay

## 2024-07-05 ENCOUNTER — Inpatient Hospital Stay: Attending: Internal Medicine

## 2024-07-05 ENCOUNTER — Ambulatory Visit (HOSPITAL_COMMUNITY)
Admission: RE | Admit: 2024-07-05 | Discharge: 2024-07-05 | Disposition: A | Discharge status: Home / Self Care | Source: Ambulatory Visit | Attending: Internal Medicine | Admitting: Internal Medicine

## 2024-07-05 DIAGNOSIS — Z8042 Family history of malignant neoplasm of prostate: Secondary | ICD-10-CM | POA: Insufficient documentation

## 2024-07-05 DIAGNOSIS — M549 Dorsalgia, unspecified: Secondary | ICD-10-CM | POA: Insufficient documentation

## 2024-07-05 DIAGNOSIS — M479 Spondylosis, unspecified: Secondary | ICD-10-CM | POA: Insufficient documentation

## 2024-07-05 DIAGNOSIS — Z79899 Other long term (current) drug therapy: Secondary | ICD-10-CM | POA: Insufficient documentation

## 2024-07-05 DIAGNOSIS — Z803 Family history of malignant neoplasm of breast: Secondary | ICD-10-CM | POA: Insufficient documentation

## 2024-07-05 DIAGNOSIS — Z800 Family history of malignant neoplasm of digestive organs: Secondary | ICD-10-CM | POA: Insufficient documentation

## 2024-07-05 DIAGNOSIS — C3490 Malignant neoplasm of unspecified part of unspecified bronchus or lung: Secondary | ICD-10-CM

## 2024-07-05 DIAGNOSIS — C3412 Malignant neoplasm of upper lobe, left bronchus or lung: Secondary | ICD-10-CM | POA: Insufficient documentation

## 2024-07-05 LAB — CMP (CANCER CENTER ONLY)
ALT: 17 U/L (ref 0–44)
AST: 22 U/L (ref 15–41)
Albumin: 4.4 g/dL (ref 3.5–5.0)
Alkaline Phosphatase: 78 U/L (ref 38–126)
Anion gap: 14 (ref 5–15)
BUN: 13 mg/dL (ref 8–23)
CO2: 28 mmol/L (ref 22–32)
Calcium: 9.6 mg/dL (ref 8.9–10.3)
Chloride: 98 mmol/L (ref 98–111)
Creatinine: 0.97 mg/dL (ref 0.44–1.00)
GFR, Estimated: >60 mL/min
Glucose, Bld: 109 mg/dL — ABNORMAL HIGH (ref 70–99)
Potassium: 3.4 mmol/L — ABNORMAL LOW (ref 3.5–5.1)
Sodium: 139 mmol/L (ref 135–145)
Total Bilirubin: 0.4 mg/dL (ref 0.0–1.2)
Total Protein: 7.7 g/dL (ref 6.5–8.1)

## 2024-07-05 LAB — CBC WITH DIFFERENTIAL (CANCER CENTER ONLY)
Abs Immature Granulocytes: 0.06 K/uL (ref 0.00–0.07)
Basophils Absolute: 0.1 K/uL (ref 0.0–0.1)
Basophils Relative: 1 %
Eosinophils Absolute: 0.3 K/uL (ref 0.0–0.5)
Eosinophils Relative: 2 %
HCT: 44.2 % (ref 36.0–46.0)
Hemoglobin: 15.1 g/dL — ABNORMAL HIGH (ref 12.0–15.0)
Immature Granulocytes: 1 %
Lymphocytes Relative: 26 %
Lymphs Abs: 2.8 K/uL (ref 0.7–4.0)
MCH: 29.2 pg (ref 26.0–34.0)
MCHC: 34.2 g/dL (ref 30.0–36.0)
MCV: 85.3 fL (ref 80.0–100.0)
Monocytes Absolute: 0.7 K/uL (ref 0.1–1.0)
Monocytes Relative: 7 %
Neutro Abs: 6.7 K/uL (ref 1.7–7.7)
Neutrophils Relative %: 63 %
Platelet Count: 345 K/uL (ref 150–400)
RBC: 5.18 MIL/uL — ABNORMAL HIGH (ref 3.87–5.11)
RDW: 13.2 % (ref 11.5–15.5)
WBC Count: 10.6 K/uL — ABNORMAL HIGH (ref 4.0–10.5)
nRBC: 0 % (ref 0.0–0.2)

## 2024-07-05 MED ORDER — IOHEXOL 300 MG/ML  SOLN
75.0000 mL | Freq: Once | INTRAMUSCULAR | Status: AC | PRN
  Administered 2024-07-05: 75 mL via INTRAVENOUS

## 2024-07-11 ENCOUNTER — Ambulatory Visit: Admitting: Internal Medicine

## 2024-07-12 ENCOUNTER — Inpatient Hospital Stay (HOSPITAL_BASED_OUTPATIENT_CLINIC_OR_DEPARTMENT_OTHER): Payer: Self-pay | Admitting: Internal Medicine

## 2024-07-12 VITALS — BP 102/73 | HR 96 | Temp 97.8°F | Ht 66.35 in | Wt 221.1 lb

## 2024-07-12 DIAGNOSIS — C3490 Malignant neoplasm of unspecified part of unspecified bronchus or lung: Secondary | ICD-10-CM

## 2024-07-12 NOTE — Progress Notes (Signed)
 "     Smith [phone]   [phone]  OFFICE PROGRESS NOTE  Smith Smith Smith, Smith Smith [address] [place] [address]  DIAGNOSIS: stage IA (T1b, N0, M0) non-small cell lung cancer, adenocarcinoma diagnosed in April 2024.  PRIOR THERAPY: status post left upper lobectomy with lymph node sampling under the care of Dr. Shyrl on October 11, 2022 with tumor size of 1.3 cm.   CURRENT THERAPY: Observation.  INTERVAL HISTORY: Smith Smith 64 y.o. female returns to the clinic today for follow-up visit. Discussed the use of AI scribe software for clinical note transcription with the patient, who gave verbal consent to proceed.  History of Present Illness Smith Smith is a 64 year old female with stage I non-small cell lung adenocarcinoma status post left upper lobectomy who presents for restaging and repeat chest CT.  She was diagnosed with stage I non-small cell lung adenocarcinoma of the left upper lobe in April 2024 and underwent left upper lobectomy with lymph node sampling. She has been under observation since surgery and presents for routine restaging.  She remains asymptomatic, denying chest pain, dyspnea, vomiting, diarrhea, or weight loss since her last visit in July 2025. She reports no fevers, chills, sweats, or other constitutional symptoms.  She notes experiencing mild headaches, which she attributes to discontinuing sodas and caffeine, but otherwise feels well.  She expresses concern regarding abnormal laboratory findings, which included mildly elevated hemoglobin and white blood cell count, as well as slightly decreased potassium. Her blood glucose measurement was taken without fasting. No new symptoms or complaints are identified.    MEDICAL HISTORY: Past Medical History:  Diagnosis Date   Allergy    Breast cyst    Endometriosis    surgery laser 2001   Family history of breast cancer    Family history of colon cancer     Family history of prostate cancer    Hemorrhoids    Hx of basal cell carcinoma    Hypertension    Lung cancer (HCC)    09/2022   Migraine    Thyroid  disease    Urinary tract infection     ALLERGIES:  is allergic to bee venom, etodolac, latex, other, and adhesive [tape].  MEDICATIONS:  Current Outpatient Medications  Medication Sig Dispense Refill   Ascorbic Acid (VITAMIN C) 1000 MG tablet Take 1,000 mg by mouth 2 (two) times daily.     b complex vitamins tablet Take 1 tablet by mouth daily.     CALCIUM-MAG-VIT C-VIT D PO Take 1 tablet by mouth every other day. Alternate days with calcium vitamin d     cetirizine (ZYRTEC) 10 MG tablet Take 10 mg by mouth daily.     clobetasol ointment (TEMOVATE) 0.05 % Apply 1 Application topically 2 (two) times daily as needed.     cyclobenzaprine  (FLEXERIL ) 10 MG tablet Take 10 mg by mouth 3 (three) times daily as needed for muscle spasms.     EPINEPHrine  0.3 mg/0.3 mL IJ SOAJ injection Inject 0.3 mg into the muscle as needed for anaphylaxis. 1 each 1   hydrochlorothiazide  (HYDRODIURIL ) 25 MG tablet Take 1 tablet (25 mg total) by mouth daily. 90 tablet 3   ibuprofen (ADVIL) 200 MG tablet Take 600 mg by mouth every 6 (six) hours as needed for moderate pain.     levothyroxine  (SYNTHROID ) 50 MCG tablet Take 1 tablet (50 mcg total) by mouth daily. 90 tablet 3   meclizine  (ANTIVERT ) 25 MG tablet  TAKE 2 BY MOUTH TWICE DAILY AS NEEDED FOR TRAVEL 30 tablet 0   Misc Natural Products (RED WINE EXTRACT PO) Take 1 capsule by mouth daily. Red wine extract     Multiple Vitamins-Iron (MULTIVITAMIN/IRON) TABS Take 1 tablet by mouth daily.     NURTEC 75 MG TBDP TAKE 1 TABLET BY MOUTH AS NEEDED FOR MIGRAINES (Patient not taking: Reported on 06/20/2024) 8 tablet 12   nystatin  powder Apply topically 2 (two) times daily. As needed for rash 30 g 5   sulindac  (CLINORIL ) 200 MG tablet Take 1 tablet (200 mg total) by mouth 2 (two) times daily as needed (headache). Reported  on 09/23/2015 30 tablet 5   traMADol  (ULTRAM ) 50 MG tablet Take 50 mg by mouth every 6 (six) hours as needed for moderate pain.     Ubrogepant  (UBRELVY ) 50 MG TABS Take 1 tablet (50 mg total) by mouth as needed. May repeat x 1 tab after 2 hours; max 2 tabs per day or 8 per month 8 tablet 6   verapamil  (CALAN -SR) 180 MG CR tablet Take 1 tablet (180 mg total) by mouth at bedtime. 90 tablet 3   vitamin E 180 MG (400 UNITS) capsule Take 400 Units by mouth daily.     Current Facility-Administered Medications  Medication Dose Route Frequency Provider Last Rate Last Admin   methylPREDNISolone  acetate (DEPO-MEDROL ) injection 120 mg  120 mg Intramuscular Once Smith Smith Smith, Smith Smith        SURGICAL HISTORY:  Past Surgical History:  Procedure Laterality Date   APPENDECTOMY  1992   AT C/S   BACK SURGERY  06/06/2020   L3-4   BRONCHIAL BIOPSY  10/14/2022   Procedure: BRONCHIAL BIOPSIES;  Surgeon: Brenna Adine CROME, DO;  Location: [facility];  Service: Pulmonary;;   CESAREAN SECTION  1992   APPENDECTOMY SAME TIME   COLONOSCOPY  2015   FIDUCIAL MARKER PLACEMENT  10/14/2022   Procedure: FIDUCIAL DYE MARKING;  Surgeon: Brenna Adine CROME, DO;  Location: [facility];  Service: Pulmonary;;   HYSTEROSCOPY  08/1999   AT TIME OF LAPAROSCOPY   INTERCOSTAL NERVE BLOCK Left 10/14/2022   Procedure: INTERCOSTAL NERVE BLOCK;  Surgeon: Shyrl Linnie KIDD, Smith Smith;  Location: [facility] OR;  Service: Thoracic;  Laterality: Left;   LUNG SURGERY Left    09/2022/ left lobe removal   NODE DISSECTION Left 10/14/2022   Procedure: NODE DISSECTION;  Surgeon: Shyrl Linnie KIDD, Smith Smith;  Location: [facility] OR;  Service: Thoracic;  Laterality: Left;   PELVIC LAPAROSCOPY  08/1999   AND HYSTEROSCOPY--ENDOMETRIOSIS   POLYPECTOMY      REVIEW OF SYSTEMS:  A comprehensive review of systems was negative.   PHYSICAL EXAMINATION: General appearance: alert, cooperative, and no distress Head: Normocephalic, without obvious abnormality,  atraumatic Neck: no adenopathy, no JVD, supple, symmetrical, trachea midline, and thyroid  not enlarged, symmetric, no tenderness/mass/nodules Lymph nodes: Cervical, supraclavicular, and axillary nodes normal. Resp: clear to auscultation bilaterally Back: symmetric, no curvature. ROM normal. No CVA tenderness. Cardio: regular rate and rhythm, S1, S2 normal, no murmur, click, rub or gallop GI: soft, non-tender; bowel sounds normal; no masses,  no organomegaly Extremities: extremities normal, atraumatic, no cyanosis or edema  ECOG PERFORMANCE STATUS: 1 - Symptomatic but completely ambulatory  Blood pressure 102/73, pulse 96, temperature 97.8 F (36.6 C), temperature source Temporal, height 5' 6.35 (1.685 m), weight 221 lb 1.6 oz (100.3 kg).  LABORATORY DATA: Lab Results  Component Value Date   WBC 10.6 (H) 07/05/2024   HGB 15.1 (  H) 07/05/2024   HCT 44.2 07/05/2024   MCV 85.3 07/05/2024   PLT 345 07/05/2024      Chemistry      Component Value Date/Time   NA 139 07/05/2024 1450   K 3.4 (L) 07/05/2024 1450   CL 98 07/05/2024 1450   CO2 28 07/05/2024 1450   BUN 13 07/05/2024 1450   CREATININE 0.97 07/05/2024 1450   CREATININE 0.94 05/24/2020 0914      Component Value Date/Time   CALCIUM 9.6 07/05/2024 1450   ALKPHOS 78 07/05/2024 1450   AST 22 07/05/2024 1450   ALT 17 07/05/2024 1450   BILITOT 0.4 07/05/2024 1450       RADIOGRAPHIC STUDIES: CT Chest W Contrast Result Date: 07/06/2024 CLINICAL DATA:  Non-small cell lung cancer (NSCLC), staging. * Tracking Code: BO * EXAM: CT CHEST WITH CONTRAST TECHNIQUE: Multidetector CT imaging of the chest was performed during intravenous contrast administration. RADIATION DOSE REDUCTION: This exam was performed according to the departmental dose-optimization program which includes automated exposure control, adjustment of the mA and/or kV according to patient size and/or use of iterative reconstruction technique. CONTRAST:  75mL OMNIPAQUE   IOHEXOL  300 MG/ML  SOLN COMPARISON:  CT scan chest from 12/30/2023. FINDINGS: Cardiovascular: Normal cardiac size. No pericardial effusion. No aortic aneurysm. Mediastinum/Nodes: Visualized thyroid  gland appears grossly unremarkable. No solid / cystic mediastinal masses. The esophagus is nondistended precluding optimal assessment. No axillary, mediastinal or hilar lymphadenopathy by size criteria. Lungs/Pleura: The central tracheo-bronchial tree is patent. Redemonstration of postsurgical changes from prior left upper lobectomy. No mass or consolidation. No pleural effusion or pneumothorax. Stable pre-existing sub 4 mm nodules throughout bilateral lungs (marked with electronic arrow sign on series 2004). No new or suspicious lung nodule. Upper Abdomen: There is a partially seen sinus cyst in the left kidney interpolar region. Remaining visualized upper abdominal viscera within normal limits. Musculoskeletal: The visualized soft tissues of the chest wall are grossly unremarkable. No suspicious osseous lesions. There are mild multilevel degenerative changes in the visualized spine. IMPRESSION: 1. Redemonstration of postsurgical changes from prior left upper lobectomy. No residual or recurrent tumor. 2. Stable pre-existing sub 4 mm nodules throughout bilateral lungs. No new or suspicious lung nodule. 3. Otherwise essentially unremarkable exam, as described above. Electronically Signed   By: Ree Molt M.D.   On: 07/06/2024 09:31    ASSESSMENT AND PLAN: This is a very pleasant 64 years old white female diagnosed with stage IA (T1b, N0, M0) non-small cell lung cancer, adenocarcinoma status post left upper lobectomy with lymph node sampling under the care of Dr. Shyrl on October 11, 2022 with tumor size of 1.3 cm.  The patient is currently on observation and she is feeling fine with no concerning complaints. She had repeat CT scan of the chest performed recently.  I personally independently reviewed the scan and  discussed the results with the patient today.  Her scan showed no concerning findings for disease recurrence or metastasis. Assessment and Plan Assessment & Plan Stage I non-small cell lung adenocarcinoma, status post left upper lobectomy She remains asymptomatic without evidence of recurrence following left upper lobectomy and lymph node sampling. Recent chest CT for restaging demonstrated no concerning findings. Mild laboratory abnormalities, including slightly elevated hemoglobin and leukocytosis, and mild hypokalemia, are not clinically significant and do not indicate recurrence or acute complications. - Reviewed chest CT for restaging, which showed no evidence of recurrence. - Reviewed laboratory results and provided reassurance regarding mild abnormalities. - Recommended increased dietary potassium  intake to address mild hypokalemia. - Scheduled follow-up in one year for surveillance. She was advised to call immediately if she has any concerning symptoms in the interval. The patient voices understanding of current disease status and treatment options and is in agreement with the current care plan.  All questions were answered. The patient knows to call the clinic with any problems, questions or concerns. We can certainly see the patient much sooner if necessary.  The total time spent in the appointment was 20 minutes.  Disclaimer: This note was dictated with voice recognition software. Similar sounding words can inadvertently be transcribed and may not be corrected upon review.        "

## 2024-07-31 ENCOUNTER — Telehealth: Payer: BC Managed Care – PPO | Admitting: Diagnostic Neuroimaging

## 2025-06-21 ENCOUNTER — Encounter: Admitting: Internal Medicine

## 2025-07-10 ENCOUNTER — Inpatient Hospital Stay: Attending: Internal Medicine

## 2025-07-17 ENCOUNTER — Inpatient Hospital Stay: Admitting: Internal Medicine
